# Patient Record
Sex: Female | Born: 1945 | Race: Black or African American | Hispanic: No | Smoking: Former smoker
Health system: Southern US, Community
[De-identification: ages and names within clinical notes are randomized; demographics above are authoritative.]

## PROBLEM LIST (undated history)

## (undated) DIAGNOSIS — C801 Malignant (primary) neoplasm, unspecified: Secondary | ICD-10-CM

## (undated) DIAGNOSIS — K5900 Constipation, unspecified: Secondary | ICD-10-CM

## (undated) DIAGNOSIS — Z972 Presence of dental prosthetic device (complete) (partial): Secondary | ICD-10-CM

## (undated) DIAGNOSIS — G4700 Insomnia, unspecified: Secondary | ICD-10-CM

## (undated) DIAGNOSIS — H9190 Unspecified hearing loss, unspecified ear: Secondary | ICD-10-CM

## (undated) DIAGNOSIS — I10 Essential (primary) hypertension: Secondary | ICD-10-CM

## (undated) DIAGNOSIS — E119 Type 2 diabetes mellitus without complications: Secondary | ICD-10-CM

## (undated) DIAGNOSIS — G629 Polyneuropathy, unspecified: Secondary | ICD-10-CM

## (undated) HISTORY — DX: Constipation, unspecified: K59.00

## (undated) HISTORY — DX: Insomnia, unspecified: G47.00

## (undated) HISTORY — PX: LIVER BIOPSY: SHX301

## (undated) HISTORY — PX: CARDIAC SURGERY: SHX584

---

## 2005-06-21 ENCOUNTER — Emergency Department: Payer: Self-pay | Admitting: Emergency Medicine

## 2005-06-25 ENCOUNTER — Ambulatory Visit: Payer: Self-pay | Admitting: Family Medicine

## 2007-03-24 ENCOUNTER — Ambulatory Visit: Payer: Self-pay | Admitting: Family Medicine

## 2007-04-08 ENCOUNTER — Emergency Department: Payer: Self-pay | Admitting: Internal Medicine

## 2007-04-08 ENCOUNTER — Other Ambulatory Visit: Payer: Self-pay

## 2009-06-01 ENCOUNTER — Emergency Department: Payer: Self-pay | Admitting: Emergency Medicine

## 2010-01-03 ENCOUNTER — Ambulatory Visit: Payer: Self-pay | Admitting: Family Medicine

## 2010-02-21 ENCOUNTER — Ambulatory Visit: Payer: Self-pay | Admitting: General Practice

## 2010-09-10 ENCOUNTER — Emergency Department: Payer: Self-pay | Admitting: Emergency Medicine

## 2010-09-21 ENCOUNTER — Inpatient Hospital Stay: Payer: Self-pay | Admitting: Internal Medicine

## 2010-10-03 ENCOUNTER — Ambulatory Visit: Payer: Self-pay | Admitting: Unknown Physician Specialty

## 2010-10-07 ENCOUNTER — Ambulatory Visit: Payer: Self-pay | Admitting: Unknown Physician Specialty

## 2011-09-23 ENCOUNTER — Emergency Department: Payer: Self-pay | Admitting: Emergency Medicine

## 2011-09-23 LAB — CK TOTAL AND CKMB (NOT AT ARMC)
CK, Total: 128 U/L (ref 21–215)
CK-MB: 1.7 ng/mL (ref 0.5–3.6)

## 2011-09-23 LAB — COMPREHENSIVE METABOLIC PANEL
Alkaline Phosphatase: 156 U/L — ABNORMAL HIGH (ref 50–136)
BUN: 16 mg/dL (ref 7–18)
Calcium, Total: 10 mg/dL (ref 8.5–10.1)
Creatinine: 0.63 mg/dL (ref 0.60–1.30)
EGFR (African American): >60
EGFR (Non-African Amer.): >60
Osmolality: 281 (ref 275–301)
SGOT(AST): 13 U/L — ABNORMAL LOW (ref 15–37)
Sodium: 141 mmol/L (ref 136–145)
Total Protein: 7.7 g/dL (ref 6.4–8.2)

## 2011-09-23 LAB — TSH: Thyroid Stimulating Horm: 0.727 u[IU]/mL

## 2011-09-23 LAB — CBC
HCT: 47.4 % — ABNORMAL HIGH (ref 35.0–47.0)
HGB: 15.5 g/dL (ref 12.0–16.0)
MCH: 29.2 pg (ref 26.0–34.0)
MCHC: 32.7 g/dL (ref 32.0–36.0)
Platelet: 269 10*3/uL (ref 150–440)
RBC: 5.29 10*6/uL — ABNORMAL HIGH (ref 3.80–5.20)
RDW: 14.3 % (ref 11.5–14.5)

## 2011-09-23 LAB — URINALYSIS, COMPLETE
Bacteria: NONE SEEN
Ketone: NEGATIVE
Nitrite: NEGATIVE
Protein: NEGATIVE
Specific Gravity: 1.003 (ref 1.003–1.030)
Squamous Epithelial: 1
WBC UR: 1 /HPF (ref 0–5)

## 2011-09-23 LAB — TROPONIN I: Troponin-I: <0.02 ng/mL

## 2012-08-10 ENCOUNTER — Ambulatory Visit: Payer: Self-pay | Admitting: Internal Medicine

## 2014-08-28 ENCOUNTER — Ambulatory Visit: Payer: Medicare Other

## 2014-08-28 ENCOUNTER — Ambulatory Visit: Payer: Medicare Other | Admitting: Anesthesiology

## 2014-08-28 ENCOUNTER — Encounter
Admission: RE | Disposition: A | Discharge status: Home / Self Care | Payer: Self-pay | Source: Ambulatory Visit | Attending: Emergency Medicine

## 2014-08-28 ENCOUNTER — Observation Stay
Admission: RE | Admit: 2014-08-28 | Discharge: 2014-08-29 | Disposition: A | Discharge status: Home / Self Care | Payer: Medicare Other | Source: Ambulatory Visit | Attending: Internal Medicine | Admitting: Internal Medicine

## 2014-08-28 ENCOUNTER — Observation Stay: Payer: Medicare Other

## 2014-08-28 ENCOUNTER — Encounter: Payer: Self-pay | Admitting: *Deleted

## 2014-08-28 DIAGNOSIS — Z820 Family history of epilepsy and other diseases of the nervous system: Secondary | ICD-10-CM | POA: Diagnosis not present

## 2014-08-28 DIAGNOSIS — I209 Angina pectoris, unspecified: Secondary | ICD-10-CM

## 2014-08-28 DIAGNOSIS — Z79899 Other long term (current) drug therapy: Secondary | ICD-10-CM | POA: Insufficient documentation

## 2014-08-28 DIAGNOSIS — E010 Iodine-deficiency related diffuse (endemic) goiter: Secondary | ICD-10-CM | POA: Insufficient documentation

## 2014-08-28 DIAGNOSIS — I10 Essential (primary) hypertension: Secondary | ICD-10-CM | POA: Insufficient documentation

## 2014-08-28 DIAGNOSIS — E785 Hyperlipidemia, unspecified: Secondary | ICD-10-CM | POA: Diagnosis not present

## 2014-08-28 DIAGNOSIS — R000 Tachycardia, unspecified: Secondary | ICD-10-CM | POA: Diagnosis not present

## 2014-08-28 DIAGNOSIS — Z6821 Body mass index (BMI) 21.0-21.9, adult: Secondary | ICD-10-CM | POA: Diagnosis not present

## 2014-08-28 DIAGNOSIS — R0789 Other chest pain: Secondary | ICD-10-CM | POA: Diagnosis not present

## 2014-08-28 DIAGNOSIS — R0602 Shortness of breath: Secondary | ICD-10-CM | POA: Diagnosis not present

## 2014-08-28 DIAGNOSIS — H9190 Unspecified hearing loss, unspecified ear: Secondary | ICD-10-CM | POA: Diagnosis not present

## 2014-08-28 DIAGNOSIS — Z7982 Long term (current) use of aspirin: Secondary | ICD-10-CM | POA: Insufficient documentation

## 2014-08-28 DIAGNOSIS — E119 Type 2 diabetes mellitus without complications: Secondary | ICD-10-CM | POA: Insufficient documentation

## 2014-08-28 DIAGNOSIS — I2510 Atherosclerotic heart disease of native coronary artery without angina pectoris: Secondary | ICD-10-CM | POA: Insufficient documentation

## 2014-08-28 DIAGNOSIS — R42 Dizziness and giddiness: Secondary | ICD-10-CM | POA: Insufficient documentation

## 2014-08-28 DIAGNOSIS — E876 Hypokalemia: Secondary | ICD-10-CM

## 2014-08-28 DIAGNOSIS — I959 Hypotension, unspecified: Secondary | ICD-10-CM | POA: Insufficient documentation

## 2014-08-28 DIAGNOSIS — Z833 Family history of diabetes mellitus: Secondary | ICD-10-CM | POA: Insufficient documentation

## 2014-08-28 DIAGNOSIS — R160 Hepatomegaly, not elsewhere classified: Secondary | ICD-10-CM | POA: Diagnosis not present

## 2014-08-28 DIAGNOSIS — F17210 Nicotine dependence, cigarettes, uncomplicated: Secondary | ICD-10-CM | POA: Diagnosis not present

## 2014-08-28 DIAGNOSIS — R634 Abnormal weight loss: Secondary | ICD-10-CM | POA: Diagnosis not present

## 2014-08-28 DIAGNOSIS — R079 Chest pain, unspecified: Secondary | ICD-10-CM

## 2014-08-28 DIAGNOSIS — M1990 Unspecified osteoarthritis, unspecified site: Secondary | ICD-10-CM | POA: Insufficient documentation

## 2014-08-28 DIAGNOSIS — R0781 Pleurodynia: Secondary | ICD-10-CM

## 2014-08-28 HISTORY — DX: Type 2 diabetes mellitus without complications: E11.9

## 2014-08-28 HISTORY — DX: Essential (primary) hypertension: I10

## 2014-08-28 LAB — BASIC METABOLIC PANEL
Anion gap: 10 (ref 5–15)
BUN: 13 mg/dL (ref 6–20)
CO2: 29 mmol/L (ref 22–32)
Calcium: 8.9 mg/dL (ref 8.9–10.3)
Chloride: 103 mmol/L (ref 101–111)
Creatinine, Ser: 0.89 mg/dL (ref 0.44–1.00)
GFR calc Af Amer: >60 mL/min (ref >60)
GFR calc non Af Amer: >60 mL/min (ref >60)
GLUCOSE: 97 mg/dL (ref 65–99)
POTASSIUM: 2.9 mmol/L — AB (ref 3.5–5.1)
Sodium: 142 mmol/L (ref 135–145)

## 2014-08-28 LAB — PROTIME-INR
INR: 1.12
Prothrombin Time: 14.6 seconds (ref 11.4–15.0)

## 2014-08-28 LAB — CBC
HCT: 30.7 % — ABNORMAL LOW (ref 35.0–47.0)
HEMOGLOBIN: 9.7 g/dL — AB (ref 12.0–16.0)
MCH: 26.4 pg (ref 26.0–34.0)
MCHC: 31.8 g/dL — ABNORMAL LOW (ref 32.0–36.0)
MCV: 83.2 fL (ref 80.0–100.0)
Platelets: 548 10*3/uL — ABNORMAL HIGH (ref 150–440)
RBC: 3.69 MIL/uL — ABNORMAL LOW (ref 3.80–5.20)
RDW: 16.3 % — AB (ref 11.5–14.5)
WBC: 15.1 10*3/uL — ABNORMAL HIGH (ref 3.6–11.0)

## 2014-08-28 LAB — URINALYSIS COMPLETE WITH MICROSCOPIC (ARMC ONLY)
BILIRUBIN URINE: NEGATIVE
Bacteria, UA: NONE SEEN
GLUCOSE, UA: NEGATIVE mg/dL
Hgb urine dipstick: NEGATIVE
Ketones, ur: NEGATIVE mg/dL
Leukocytes, UA: NEGATIVE
NITRITE: NEGATIVE
Protein, ur: NEGATIVE mg/dL
Specific Gravity, Urine: 1.016 (ref 1.005–1.030)
pH: 5 (ref 5.0–8.0)

## 2014-08-28 LAB — HEPATIC FUNCTION PANEL
ALBUMIN: 2.4 g/dL — AB (ref 3.5–5.0)
ALK PHOS: 178 U/L — AB (ref 38–126)
ALT: 11 U/L — AB (ref 14–54)
AST: 24 U/L (ref 15–41)
Bilirubin, Direct: <0.1 mg/dL — ABNORMAL LOW (ref 0.1–0.5)
Total Bilirubin: 0.6 mg/dL (ref 0.3–1.2)
Total Protein: 6 g/dL — ABNORMAL LOW (ref 6.5–8.1)

## 2014-08-28 LAB — GLUCOSE, CAPILLARY
GLUCOSE-CAPILLARY: 104 mg/dL — AB (ref 65–99)
GLUCOSE-CAPILLARY: 73 mg/dL (ref 65–99)
Glucose-Capillary: 101 mg/dL — ABNORMAL HIGH (ref 65–99)

## 2014-08-28 LAB — FIBRIN DERIVATIVES D-DIMER (ARMC ONLY): FIBRIN DERIVATIVES D-DIMER (ARMC): 1524 — AB (ref 0–499)

## 2014-08-28 LAB — APTT: APTT: 39 s — AB (ref 24–36)

## 2014-08-28 LAB — POTASSIUM: Potassium: 3.3 mmol/L — ABNORMAL LOW (ref 3.5–5.1)

## 2014-08-28 LAB — TROPONIN I: Troponin I: <0.03 ng/mL (ref <0.031)

## 2014-08-28 LAB — MAGNESIUM: MAGNESIUM: 2 mg/dL (ref 1.7–2.4)

## 2014-08-28 SURGERY — COLONOSCOPY
Anesthesia: General

## 2014-08-28 MED ORDER — ASPIRIN EC 325 MG PO TBEC
325.0000 mg | DELAYED_RELEASE_TABLET | Freq: Once | ORAL | Status: AC
  Administered 2014-08-28: 325 mg via ORAL
  Filled 2014-08-28 (×2): qty 1

## 2014-08-28 MED ORDER — IOHEXOL 350 MG/ML SOLN
100.0000 mL | Freq: Once | INTRAVENOUS | Status: AC | PRN
  Administered 2014-08-28: 100 mL via INTRAVENOUS

## 2014-08-28 MED ORDER — HYDROCHLOROTHIAZIDE 25 MG PO TABS: 25.0000 mg | ORAL_TABLET | Freq: Every day | ORAL | Status: DC

## 2014-08-28 MED ORDER — TRAZODONE HCL 50 MG PO TABS
25.0000 mg | ORAL_TABLET | Freq: Every evening | ORAL | Status: DC | PRN
  Administered 2014-08-28: 25 mg via ORAL
  Filled 2014-08-28: qty 1

## 2014-08-28 MED ORDER — GLIPIZIDE 10 MG PO TABS: 10.0000 mg | ORAL_TABLET | Freq: Every day | ORAL | Status: DC

## 2014-08-28 MED ORDER — METFORMIN HCL 500 MG PO TABS
1000.0000 mg | ORAL_TABLET | Freq: Two times a day (BID) | ORAL | Status: DC
  Administered 2014-08-28: 1000 mg via ORAL
  Filled 2014-08-28: qty 2

## 2014-08-28 MED ORDER — PIOGLITAZONE HCL 30 MG PO TABS: 30.0000 mg | ORAL_TABLET | Freq: Every day | ORAL | Status: DC

## 2014-08-28 MED ORDER — ATORVASTATIN CALCIUM 20 MG PO TABS
40.0000 mg | ORAL_TABLET | Freq: Every day | ORAL | Status: DC
  Administered 2014-08-29: 40 mg via ORAL
  Filled 2014-08-28: qty 2

## 2014-08-28 MED ORDER — POTASSIUM CHLORIDE 20 MEQ PO PACK
40.0000 meq | PACK | ORAL | Status: AC
  Administered 2014-08-28: 40 meq via ORAL

## 2014-08-28 MED ORDER — NITROGLYCERIN 0.4 MG SL SUBL
0.4000 mg | SUBLINGUAL_TABLET | SUBLINGUAL | Status: DC | PRN
  Administered 2014-08-28: 0.4 mg via SUBLINGUAL
  Filled 2014-08-28 (×3): qty 25

## 2014-08-28 MED ORDER — POTASSIUM CHLORIDE 20 MEQ/100ML IV SOLN: 20.0000 meq | Freq: Once | INTRAVENOUS | Status: DC

## 2014-08-28 MED ORDER — LISINOPRIL 20 MG PO TABS: 20.0000 mg | ORAL_TABLET | Freq: Every day | ORAL | Status: DC

## 2014-08-28 MED ORDER — INSULIN ASPART 100 UNIT/ML ~~LOC~~ SOLN
3.0000 [IU] | Freq: Three times a day (TID) | SUBCUTANEOUS | Status: DC
  Administered 2014-08-29: 3 [IU] via SUBCUTANEOUS
  Filled 2014-08-28: qty 3

## 2014-08-28 MED ORDER — METOPROLOL SUCCINATE ER 100 MG PO TB24: 200.0000 mg | ORAL_TABLET | Freq: Every day | ORAL | Status: DC

## 2014-08-28 MED ORDER — HEPARIN BOLUS VIA INFUSION
3300.0000 [IU] | Freq: Once | INTRAVENOUS | Status: AC
  Administered 2014-08-28: 3300 [IU] via INTRAVENOUS
  Filled 2014-08-28: qty 3300

## 2014-08-28 MED ORDER — SODIUM CHLORIDE 0.9 % IV SOLN
INTRAVENOUS | Status: DC
  Administered 2014-08-28: 09:00:00 via INTRAVENOUS

## 2014-08-28 MED ORDER — POTASSIUM CHLORIDE 20 MEQ/15ML (10%) PO SOLN
20.0000 meq | ORAL | Status: DC
  Filled 2014-08-28: qty 15

## 2014-08-28 MED ORDER — POTASSIUM CHLORIDE 20 MEQ PO PACK
PACK | ORAL | Status: AC
  Administered 2014-08-28: 40 meq via ORAL
  Filled 2014-08-28: qty 2

## 2014-08-28 MED ORDER — NICOTINE 10 MG IN INHA
1.0000 | RESPIRATORY_TRACT | Status: AC | PRN
  Filled 2014-08-28: qty 168

## 2014-08-28 MED ORDER — INSULIN ASPART 100 UNIT/ML ~~LOC~~ SOLN: 0.0000 [IU] | Freq: Every day | SUBCUTANEOUS | Status: DC

## 2014-08-28 MED ORDER — SODIUM CHLORIDE 0.9 % IV SOLN: INTRAVENOUS | Status: DC

## 2014-08-28 MED ORDER — HEPARIN (PORCINE) IN NACL 100-0.45 UNIT/ML-% IJ SOLN
650.0000 [IU]/h | INTRAMUSCULAR | Status: DC
  Administered 2014-08-28: 650 [IU]/h via INTRAVENOUS
  Filled 2014-08-28: qty 250

## 2014-08-28 MED ORDER — POTASSIUM CHLORIDE CRYS ER 20 MEQ PO TBCR: 40.0000 meq | EXTENDED_RELEASE_TABLET | ORAL | Status: DC | PRN

## 2014-08-28 MED ORDER — NITROGLYCERIN 2 % TD OINT
0.5000 [in_us] | TOPICAL_OINTMENT | Freq: Four times a day (QID) | TRANSDERMAL | Status: DC
  Administered 2014-08-28 – 2014-08-29 (×2): 0.5 [in_us] via TOPICAL
  Filled 2014-08-28 (×2): qty 1

## 2014-08-28 MED ORDER — LISINOPRIL-HYDROCHLOROTHIAZIDE 20-25 MG PO TABS: 1.0000 | ORAL_TABLET | Freq: Every day | ORAL | Status: DC

## 2014-08-28 MED ORDER — NICOTINE 21 MG/24HR TD PT24
21.0000 mg | MEDICATED_PATCH | Freq: Every day | TRANSDERMAL | Status: DC
  Administered 2014-08-28 – 2014-08-29 (×2): 21 mg via TRANSDERMAL
  Filled 2014-08-28 (×2): qty 1

## 2014-08-28 MED ORDER — POTASSIUM CHLORIDE 10 MEQ/100ML IV SOLN
10.0000 meq | INTRAVENOUS | Status: DC
  Administered 2014-08-28: 10 meq via INTRAVENOUS
  Filled 2014-08-28 (×2): qty 100

## 2014-08-28 MED ORDER — INSULIN ASPART 100 UNIT/ML ~~LOC~~ SOLN
0.0000 [IU] | Freq: Three times a day (TID) | SUBCUTANEOUS | Status: DC
  Administered 2014-08-29: 3 [IU] via SUBCUTANEOUS
  Filled 2014-08-28: qty 3

## 2014-08-28 MED ORDER — ASPIRIN EC 81 MG PO TBEC
81.0000 mg | DELAYED_RELEASE_TABLET | Freq: Every day | ORAL | Status: DC
  Administered 2014-08-29: 81 mg via ORAL
  Filled 2014-08-28: qty 1

## 2014-08-28 NOTE — ED Notes (Signed)
Pt was in endo to have annual colonoscopy and developed left sided chest pain.

## 2014-08-28 NOTE — Progress Notes (Signed)
ANTICOAGULATION CONSULT NOTE - Initial Consult  Pharmacy Consult for Heparin drip Indication: chest pain/ACS  No Known Allergies  Patient Measurements: Height: 5\' 5"  (165.1 cm) Weight: 123 lb 11.2 oz (56.11 kg) (admisson) IBW/kg (Calculated) : 57 Heparin Dosing Weight: 56.1 kg  Vital Signs: Temp: 97.9 F (36.6 C) (05/17 1640) Temp Source: Oral (05/17 1640) BP: 124/65 mmHg (05/17 1640) Pulse Rate: 64 (05/17 1640)  Labs:  Recent Labs  08/28/14 1150 08/28/14 1645  HGB 9.7*  --   HCT 30.7*  --   PLT 548*  --   APTT  --  39*  LABPROT  --  14.6  INR  --  1.12  CREATININE 0.89  --   TROPONINI <0.03  --     Estimated Creatinine Clearance: 52.1 mL/min (by C-G formula based on Cr of 0.89).   Medical History: Past Medical History  Diagnosis Date  . Hypertension   . Diabetes mellitus without complication   . Arthritis      Goal of Therapy:  Heparin level 0.3-0.7 units/ml Monitor platelets by anticoagulation protocol: Yes   Plan:  Give 3300 units bolus x 1 Start heparin infusion at 650 units/hr Check anti-Xa level in 8 hours and daily while on heparin Continue to monitor H&H and platelets - CBC in AM   Rayna Sexton, PharmD, BCPS Clinical Pharmacist 08/28/2014,5:54 PM

## 2014-08-28 NOTE — Anesthesia Preprocedure Evaluation (Signed)
Anesthesia Evaluation  Patient identified by MRN, date of birth, ID band Patient awake    Reviewed: Allergy & Precautions, NPO status , Patient's Chart, lab work & pertinent test results  Airway Mallampati: II  TM Distance: >3 FB Neck ROM: Full    Dental  (+) Partial Lower, Upper Dentures   Pulmonary Current Smoker (<1/2 ppd),          Cardiovascular hypertension, Pt. on medications     Neuro/Psych    GI/Hepatic   Endo/Other  diabetes, Type 2, Oral Hypoglycemic Agents  Renal/GU      Musculoskeletal   Abdominal   Peds  Hematology   Anesthesia Other Findings   Reproductive/Obstetrics                             Anesthesia Physical Anesthesia Plan  ASA: III  Anesthesia Plan: General   Post-op Pain Management:    Induction: Intravenous  Airway Management Planned: Nasal Cannula  Additional Equipment:   Intra-op Plan:   Post-operative Plan:   Informed Consent: I have reviewed the patients History and Physical, chart, labs and discussed the procedure including the risks, benefits and alternatives for the proposed anesthesia with the patient or authorized representative who has indicated his/her understanding and acceptance.     Plan Discussed with:   Anesthesia Plan Comments:         Anesthesia Quick Evaluation

## 2014-08-28 NOTE — ED Notes (Signed)
NPO after midnight and d/c all nicotine after midnight for am stress test. Floor nurse made aware.

## 2014-08-28 NOTE — H&P (Addendum)
[facility] [place] NAME: Sandy [name]    MR#:  [id number]  DATE OF BIRTH:  09-07-1945  DATE OF ADMISSION:  08/28/2014  PRIMARY CARE PHYSICIAN: [name], MD   REQUESTING/REFERRING PHYSICIAN:  Dr. Karma Greaser  CHIEF COMPLAINT:   Chief Complaint  Patient presents with  . Chest Pain    HISTORY OF PRESENT ILLNESS: Sandy [name]  is a 69 y.o. female with a known history of hypertension, hyperlipidemia, diabetes mellitus and tobacco abuse who presents to the hospital with chest pains. Apparently patient was being prepared for colonoscopy by Dr Allen Norris and the sided complaining of intermittent chest pains. She was sent to emergency room for further evaluation not proceeding to the procedure.  In the emergency room patient admitted of having chest pains in the left side of her chest under her breast for the past 6 weeks. She does describe being her pain as intermittent, which comes and goes, sometimes very intense, patient tells me that her pain comes whenever she sits or lays down and there was no noted change in pain with walking, deep breathing or eating. In width, with shortness of breath as well as lightheadedness and dizziness. Patient admits of radiation to bilateral jaws as well as bilateral upper extremities. Yesterday the patient had pain which lasted approximately 15 minutes and was 8 out of 10 by intensity. She took some ibuprofen which did help her with pains but today the pain recurred and she was sent to emergency room. In emergency room, her labs revealed normal troponin, however, her EKG showed T depressions in lateral leads, however, not new. Hospitalist services were contacted for admission.   PAST MEDICAL HISTORY:   Past Medical History  Diagnosis Date  . Hypertension   . Diabetes mellitus without complication   . Arthritis     PAST SURGICAL HISTORY: History reviewed. No pertinent past surgical history.  SOCIAL  HISTORY:  History  Substance Use Topics  . Smoking status: Current Every Day Smoker -- 0.25 packs/day    Types: Cigarettes  . Smokeless tobacco: Never Used  . Alcohol Use: No    FAMILY HISTORY: History reviewed. No pertinent family history.  DRUG ALLERGIES: No Known Allergies  Review of Systems  Constitutional: Positive for weight loss and malaise/fatigue. Negative for fever and chills.  Respiratory: Positive for cough, sputum production and shortness of breath.   Cardiovascular: Positive for chest pain and palpitations. Negative for orthopnea, claudication, leg swelling and PND.  Gastrointestinal: Positive for abdominal pain. Negative for nausea, vomiting, diarrhea, constipation and blood in stool.  Genitourinary: Negative for dysuria and urgency.  Musculoskeletal: Positive for neck pain.  Neurological: Positive for dizziness and weakness. Negative for tingling, tremors and sensory change.  Psychiatric/Behavioral: Negative for depression and suicidal ideas.    MEDICATIONS AT HOME:  Prior to Admission medications   Medication Sig Start Date End Date Taking? Authorizing Provider  amLODipine (NORVASC) 10 MG tablet Take 10 mg by mouth daily.   Yes Historical Provider, MD  aspirin EC 81 MG tablet Take 81 mg by mouth daily.   Yes Historical Provider, MD  atorvastatin (LIPITOR) 40 MG tablet Take 40 mg by mouth daily.   Yes Historical Provider, MD  glipiZIDE (GLUCOTROL) 10 MG tablet Take 10 mg by mouth daily.   Yes Historical Provider, MD  ibuprofen (ADVIL,MOTRIN) 200 MG tablet Take 400 mg by mouth every 6 (six) hours as needed for headache or moderate pain.   Yes Historical  Provider, MD  lisinopril-hydrochlorothiazide (PRINZIDE,ZESTORETIC) 20-25 MG per tablet Take 1 tablet by mouth daily.   Yes Historical Provider, MD  metFORMIN (GLUCOPHAGE) 1000 MG tablet Take 1,000 mg by mouth 2 (two) times daily.   Yes Historical Provider, MD  metoprolol (TOPROL-XL) 200 MG 24 hr tablet Take 200 mg by  mouth daily.   Yes Historical Provider, MD  Multiple Vitamin (MULTIVITAMIN WITH MINERALS) TABS tablet Take 1 tablet by mouth daily.   Yes Historical Provider, MD  pioglitazone (ACTOS) 30 MG tablet Take 30 mg by mouth daily.   Yes Historical Provider, MD      PHYSICAL EXAMINATION:   VITAL SIGNS: Blood pressure 115/68, pulse 61, temperature 98.5 F (36.9 C), temperature source Oral, resp. rate 16, height 5\' 5"  (1.651 m), weight 55.339 kg (122 lb), SpO2 99 %.  GENERAL:  69 y.o.-year-old patient lying in the bed with no acute distress.  EYES: Pupils equal, round, reactive to light and accommodation. No scleral icterus. Extraocular muscles intact.  HEENT: Head atraumatic, normocephalic. Oropharynx and nasopharynx clear.  NECK:  Supple, no jugular venous distention. No thyroid enlargement, no tenderness.  LUNGS: Normal breath sounds bilaterally, no wheezing, rales,rhonchi or crepitation. No use of accessory muscles of respiration.  CARDIOVASCULAR: S1, S2 normal. No murmurs, rubs, or gallops.  ABDOMEN: Soft, nontender, nondistended. Bowel sounds present. No organomegaly or mass.  EXTREMITIES: No pedal edema, cyanosis, or clubbing.  NEUROLOGIC: Cranial nerves II through XII are intact. Muscle strength 5/5 in all extremities. Sensation intact. Gait not checked.  PSYCHIATRIC: The patient is alert and oriented x 3.  SKIN: No obvious rash, lesion, or ulcer.   LABORATORY PANEL:   CBC  Recent Labs Lab 08/28/14 1150  WBC 15.1*  HGB 9.7*  HCT 30.7*  PLT 548*  MCV 83.2  MCH 26.4  MCHC 31.8*  RDW 16.3*   ------------------------------------------------------------------------------------------------------------------  Chemistries   Recent Labs Lab 08/28/14 1150  NA 142  K 2.9*  CL 103  CO2 29  GLUCOSE 97  BUN 13  CREATININE 0.89  CALCIUM 8.9  MG 2.0  AST 24  ALT 11*  ALKPHOS 178*  BILITOT 0.6    ------------------------------------------------------------------------------------------------------------------  Cardiac Enzymes  Recent Labs Lab 08/28/14 1150  TROPONINI <0.03   ------------------------------------------------------------------------------------------------------------------  RADIOLOGY: Dg Chest 2 View  08/28/2014   CLINICAL DATA:  69 year old female presented for colonoscopy, was canceled. Hypotension, left chest pain. Initial encounter.  EXAM: CHEST  2 VIEW  COMPARISON:  09/23/2011 and earlier.  FINDINGS: Stable cardiac size at the upper limits of normal. Other mediastinal contours are within normal limits. Visualized tracheal air column is within normal limits. Stable mild elevation of the right hemidiaphragm. No pneumothorax, pulmonary edema, pleural effusion or confluent pulmonary opacity. Degenerative endplate spurring in the lower thoracic spine. No acute osseous abnormality identified.  IMPRESSION: No acute cardiopulmonary abnormality.   Electronically Signed   By: Genevie Ann M.D.   On: 08/28/2014 13:39    EKG: Orders placed or performed during the hospital encounter of 08/28/14  . EKG 12-Lead  . EKG 12-Lead  . EKG 12-Lead  . EKG 12-Lead  . ED EKG (<2mins upon arrival to the ED)  . ED EKG (<30mins upon arrival to the ED)    IMPRESSION AND PLAN: *Chest pain concerning for ischemic admit patient to medical floor, continue her on Toprol-XL, aspirin, nitroglycerin and check cardiac enzymes 3, we will also initiate patient on heparin drip. We will get cardiologist to see patient in consultation. He had and will  get stress test tomorrow if cardiac enzymes are negative. *Hypokalemia, supplement orally as well as IV , check potassium level later today. Magnesium level was checked and it was normal. *Leukocytosis of unclear etiology, get UA and culture if needed *Sinus tachycardia with heart rate 118 reported in the emergency room, we will get d-dimer checked, and  if it is elevated, we will get CT scan of the chest to rule out PE *. Tobacco abuse, discussed this patient for 4 minutes. Nicotine replacement therapy will be initiated. Patient is agreeable to quit.   All the records are reviewed and case discussed with ED provider. Management plans discussed with the patient, family and they are in agreement.  CODE STATUS:    TOTAL TIME TAKING CARE OF THIS PATIENT: 50 minutes.    Theodoro Grist M.D on 08/28/2014 at 3:04 PM  Between 7am to 6pm - Pager - [phone] After 6pm go to [email or web address] - password [email or web address]  Office  [phone]  CC: Primary care physician; [name], MD

## 2014-08-28 NOTE — ED Provider Notes (Signed)
[facility] Emergency Department Provider Note  ____________________________________________  Time seen: Approximately 12:00 PM  I have reviewed the triage vital signs and the nursing notes.   HISTORY  Chief Complaint Chest Pain    HPI Sandy [name] is a 69 y.o. female with a history of hypertension, hyperlipidemia, diabetes, and tobacco use who presents with left-sided chest pain that started while she was lying down preparing for a colonoscopy.  The procedure had not yet started and she had not received any medications.  She was sent over from the endoscopy suite to the emergency department.  The patient states that for several weeks she has had intermittent episodes of chest pain that is left-sided and occasionally radiates to her left jaw.  She describes it as mostly a pressure but also sharp and stabbing at times.  It is accompanied with shortness of breath.  She denies nausea and vomiting.  Her pain today was severe pressure at its worse and now is mild but still present.She received a full dose aspirin after the start of her symptoms.   Past Medical History  Diagnosis Date  . Hypertension   . Diabetes mellitus without complication   . Arthritis     There are no active problems to display for this patient.   History reviewed. No pertinent past surgical history.  Current Outpatient Rx  Name  Route  Sig  Dispense  Refill  . amLODipine (NORVASC) 10 MG tablet   Oral   Take 10 mg by mouth daily.         Marland Kitchen aspirin EC 81 MG tablet   Oral   Take 81 mg by mouth daily.         Marland Kitchen atorvastatin (LIPITOR) 40 MG tablet   Oral   Take 40 mg by mouth daily.         Marland Kitchen glipiZIDE (GLUCOTROL) 10 MG tablet   Oral   Take 10 mg by mouth daily.         Marland Kitchen ibuprofen (ADVIL,MOTRIN) 200 MG tablet   Oral   Take 400 mg by mouth every 6 (six) hours as needed for headache or moderate pain.         Marland Kitchen lisinopril-hydrochlorothiazide  (PRINZIDE,ZESTORETIC) 20-25 MG per tablet   Oral   Take 1 tablet by mouth daily.         . metFORMIN (GLUCOPHAGE) 1000 MG tablet   Oral   Take 1,000 mg by mouth 2 (two) times daily.         . metoprolol (TOPROL-XL) 200 MG 24 hr tablet   Oral   Take 200 mg by mouth daily.         . Multiple Vitamin (MULTIVITAMIN WITH MINERALS) TABS tablet   Oral   Take 1 tablet by mouth daily.         . pioglitazone (ACTOS) 30 MG tablet   Oral   Take 30 mg by mouth daily.           Allergies Review of patient's allergies indicates no known allergies.  History reviewed. No pertinent family history.  Social History History  Substance Use Topics  . Smoking status: Current Every Day Smoker -- 0.25 packs/day    Types: Cigarettes  . Smokeless tobacco: Never Used  . Alcohol Use: No    Review of Systems Constitutional: No fever/chills Eyes: No visual changes. ENT: No sore throat. Cardiovascular: Severe chest pain (heaviness and sharp) at rest as described above Respiratory: Shortness of breath  with the chest pain Gastrointestinal: No abdominal pain.  No nausea, no vomiting.  No diarrhea.  No constipation. Genitourinary: Occasional dysuria Musculoskeletal: Negative for back pain. Skin: Negative for rash. Neurological: Negative for headaches, focal weakness or numbness.  10-point ROS otherwise negative.  ____________________________________________   PHYSICAL EXAM:  VITAL SIGNS: ED Triage Vitals  Enc Vitals Group     BP 08/28/14 0853 107/66 mmHg     Pulse Rate 08/28/14 0853 118     Resp 08/28/14 0853 18     Temp 08/28/14 0853 96.8 F (36 C)     Temp Source 08/28/14 0853 Tympanic     SpO2 08/28/14 1136 98 %     Weight 08/28/14 0853 130 lb (58.968 kg)     Height 08/28/14 0853 5\' 5"  (1.651 m)     Head Cir --      Peak Flow --      Pain Score --      Pain Loc --      Pain Edu? --      Excl. in [place]? --     Constitutional: Alert and oriented. Well appearing and in no  acute distress. Eyes: Conjunctivae are normal. PERRL. EOMI. Head: Atraumatic. Nose: No congestion/rhinnorhea. Mouth/Throat: Mucous membranes are moist.  Oropharynx non-erythematous. Neck: No stridor.   Cardiovascular: Normal rate, regular rhythm. Grossly normal heart sounds.  Good peripheral circulation.  No reproducible chest wall tenderness. Respiratory: Normal respiratory effort.  No retractions. Lungs CTAB. Gastrointestinal: Soft and nontender. No distention. No abdominal bruits. No CVA tenderness. Musculoskeletal: No lower extremity tenderness nor edema.  No joint effusions. Neurologic:  Normal speech and language. No gross focal neurologic deficits are appreciated. Speech is normal. No gait instability. Skin:  Skin is warm, dry and intact. No rash noted. Psychiatric: Mood and affect are normal. Speech and behavior are normal.  ____________________________________________   LABS (all labs ordered are listed, but only abnormal results are displayed)  Labs Reviewed  GLUCOSE, CAPILLARY - Abnormal; Notable for the following:    Glucose-Capillary 104 (*)    All other components within normal limits  CBC - Abnormal; Notable for the following:    WBC 15.1 (*)    RBC 3.69 (*)    Hemoglobin 9.7 (*)    HCT 30.7 (*)    MCHC 31.8 (*)    RDW 16.3 (*)    Platelets 548 (*)    All other components within normal limits  BASIC METABOLIC PANEL - Abnormal; Notable for the following:    Potassium 2.9 (*)    All other components within normal limits  URINALYSIS COMPLETEWITH MICROSCOPIC ([facility])  - Abnormal; Notable for the following:    Color, Urine YELLOW (*)    APPearance CLEAR (*)    Squamous Epithelial / LPF 0-5 (*)    All other components within normal limits  TROPONIN I  HEPATIC FUNCTION PANEL  MAGNESIUM   UA - normal ____________________________________________  EKG  ED ECG REPORT   Date: 08/28/2014  EKG Time: 11:33  Rate: 63  Rhythm: normal sinus rhythm  Axis: Normal   Intervals:none  ST&T Change: New T-wave inversions in leads V4 V5 and V6 compared to the last available EKG in 2013.  Concerning for lateral ischemia.  ____________________________________________  RADIOLOGY  Dg Chest 2 View  08/28/2014   CLINICAL DATA:  69 year old female presented for colonoscopy, was canceled. Hypotension, left chest pain. Initial encounter.  EXAM: CHEST  2 VIEW  COMPARISON:  09/23/2011 and earlier.  FINDINGS: Stable cardiac  size at the upper limits of normal. Other mediastinal contours are within normal limits. Visualized tracheal air column is within normal limits. Stable mild elevation of the right hemidiaphragm. No pneumothorax, pulmonary edema, pleural effusion or confluent pulmonary opacity. Degenerative endplate spurring in the lower thoracic spine. No acute osseous abnormality identified.  IMPRESSION: No acute cardiopulmonary abnormality.   Electronically Signed   By: Genevie Ann M.D.   On: 08/28/2014 13:39    ____________________________________________   PROCEDURES  Procedure(s) performed: None  Critical Care performed: None  ____________________________________________   INITIAL IMPRESSION / ASSESSMENT AND PLAN / ED COURSE  Pertinent labs & imaging results that were available during my care of the patient were reviewed by me and considered in my medical decision making (see chart for details).  The patient has numerous risk factors for ischemic chest pain and a HEART score of 7 which is high risk and suggests an early invasive strategy.  The patient has artery received aspirin and I will await labs but I anticipate admission for cardiac evaluation.  ----------------------------------------- 1:23 PM on 08/28/2014 -----------------------------------------  The patient's troponin is negative.  Her labs are notable for a low potassium of 2.9.  I am repeating her potassium by mouth and by IV and I will speak with the hospitalist about admission for her high  risk chest pain. ____________________________________________   FINAL CLINICAL IMPRESSION(S) / ED DIAGNOSES  Final diagnoses:  Ischemic chest pain  Hypokalemia     Hinda Kehr, MD 08/28/14 1357

## 2014-08-28 NOTE — ED Notes (Signed)
Dr Karma Greaser notified that pts K 2.9.

## 2014-08-28 NOTE — H&P (Signed)
  [facility]  [address], [place], [place] [address] Phone: [phone] Fax : [phone]  Primary Care Physician:  [name], MD Primary Gastroenterologist:  Dr. Allen Norris  Pre-Procedure History & Physical: HPI:  Sandy [name] is a 69 y.o. female is here for an endoscopy and colonoscopy.   Past Medical History  Diagnosis Date  . Hypertension   . Diabetes mellitus without complication   . Arthritis     History reviewed. No pertinent past surgical history.  Prior to Admission medications   Medication Sig Start Date End Date Taking? Authorizing Provider  amLODipine (NORVASC) 10 MG tablet Take 10 mg by mouth daily.   Yes Historical Provider, MD  aspirin EC 81 MG tablet Take 81 mg by mouth daily.   Yes Historical Provider, MD  atorvastatin (LIPITOR) 40 MG tablet Take 40 mg by mouth daily.   Yes Historical Provider, MD  glipiZIDE (GLUCOTROL) 10 MG tablet Take 10 mg by mouth daily.   Yes Historical Provider, MD  ibuprofen (ADVIL,MOTRIN) 200 MG tablet Take 400 mg by mouth every 6 (six) hours as needed for headache or moderate pain.   Yes Historical Provider, MD  lisinopril-hydrochlorothiazide (PRINZIDE,ZESTORETIC) 20-25 MG per tablet Take 1 tablet by mouth daily.   Yes Historical Provider, MD  metFORMIN (GLUCOPHAGE) 1000 MG tablet Take 1,000 mg by mouth 2 (two) times daily.   Yes Historical Provider, MD  metoprolol (TOPROL-XL) 200 MG 24 hr tablet Take 200 mg by mouth daily.   Yes Historical Provider, MD  Multiple Vitamin (MULTIVITAMIN WITH MINERALS) TABS tablet Take 1 tablet by mouth daily.   Yes Historical Provider, MD  pioglitazone (ACTOS) 30 MG tablet Take 30 mg by mouth daily.   Yes Historical Provider, MD    Allergies as of 08/06/2014  . (Not on File)    History reviewed. No pertinent family history.  History   Social History  . Marital Status: Single    Spouse Name: N/A  . Number of Children: N/A  . Years of Education: N/A    Occupational History  . Not on file.   Social History Main Topics  . Smoking status: Light Tobacco Smoker -- 0.25 packs/day    Types: Cigarettes  . Smokeless tobacco: Not on file  . Alcohol Use: No  . Drug Use: No  . Sexual Activity: Not on file   Other Topics Concern  . Not on file   Social History Narrative  . No narrative on file    Review of Systems: See HPI, otherwise negative ROS  Physical Exam: BP 107/66 mmHg  Pulse 118  Temp(Src) 96.8 F (36 C) (Tympanic)  Resp 18  Ht 5\' 5"  (1.651 m)  Wt 130 lb (58.968 kg)  BMI 21.63 kg/m2 General:   Alert,  pleasant and cooperative in NAD Head:  Normocephalic and atraumatic. Neck:  Supple; no masses or thyromegaly. Lungs:  Clear throughout to auscultation.    Heart:  Regular rate and rhythm. Abdomen:  Soft, nontender and nondistended. Normal bowel sounds, without guarding, and without rebound.   Neurologic:  Alert and  oriented x4;  grossly normal neurologically.  Impression/Plan: Sandy [name] is here for an endoscopy and colonoscopy to be performed for weight loss and nausea  Risks, benefits, limitations, and alternatives regarding  endoscopy and colonoscopy have been reviewed with the patient.  Questions have been answered.  All parties agreeable.   [facility], MD  08/28/2014, 9:31 AM

## 2014-08-29 ENCOUNTER — Encounter: Payer: Self-pay | Admitting: Radiology

## 2014-08-29 ENCOUNTER — Observation Stay: Payer: Medicare Other

## 2014-08-29 LAB — COMPREHENSIVE METABOLIC PANEL
ALBUMIN: 2.1 g/dL — AB (ref 3.5–5.0)
ALK PHOS: 163 U/L — AB (ref 38–126)
ALT: 9 U/L — AB (ref 14–54)
ANION GAP: 10 (ref 5–15)
AST: 19 U/L (ref 15–41)
BUN: 12 mg/dL (ref 6–20)
CO2: 27 mmol/L (ref 22–32)
Calcium: 8.3 mg/dL — ABNORMAL LOW (ref 8.9–10.3)
Chloride: 98 mmol/L — ABNORMAL LOW (ref 101–111)
Creatinine, Ser: 0.74 mg/dL (ref 0.44–1.00)
GFR calc Af Amer: >60 mL/min (ref >60)
GFR calc non Af Amer: >60 mL/min (ref >60)
Glucose, Bld: 53 mg/dL — ABNORMAL LOW (ref 65–99)
POTASSIUM: 2.9 mmol/L — AB (ref 3.5–5.1)
SODIUM: 135 mmol/L (ref 135–145)
Total Bilirubin: 0.5 mg/dL (ref 0.3–1.2)
Total Protein: 5.4 g/dL — ABNORMAL LOW (ref 6.5–8.1)

## 2014-08-29 LAB — CBC
HCT: 27.9 % — ABNORMAL LOW (ref 35.0–47.0)
HEMOGLOBIN: 9 g/dL — AB (ref 12.0–16.0)
MCH: 26.5 pg (ref 26.0–34.0)
MCHC: 32.1 g/dL (ref 32.0–36.0)
MCV: 82.5 fL (ref 80.0–100.0)
Platelets: 490 10*3/uL — ABNORMAL HIGH (ref 150–440)
RBC: 3.38 MIL/uL — ABNORMAL LOW (ref 3.80–5.20)
RDW: 16.6 % — AB (ref 11.5–14.5)
WBC: 14.2 10*3/uL — ABNORMAL HIGH (ref 3.6–11.0)

## 2014-08-29 LAB — HEPARIN LEVEL (UNFRACTIONATED): Heparin Unfractionated: <0.1 IU/mL — ABNORMAL LOW (ref 0.30–0.70)

## 2014-08-29 LAB — GLUCOSE, CAPILLARY
GLUCOSE-CAPILLARY: 75 mg/dL (ref 65–99)
GLUCOSE-CAPILLARY: 80 mg/dL (ref 65–99)
Glucose-Capillary: 57 mg/dL — ABNORMAL LOW (ref 65–99)
Glucose-Capillary: 87 mg/dL (ref 65–99)
Glucose-Capillary: 151 mg/dL — ABNORMAL HIGH (ref 65–99)

## 2014-08-29 LAB — HEMOGLOBIN A1C: Hgb A1c MFr Bld: 5.9 % (ref 4.0–6.0)

## 2014-08-29 MED ORDER — HEPARIN BOLUS VIA INFUSION
1700.0000 [IU] | Freq: Once | INTRAVENOUS | Status: AC
  Administered 2014-08-29: 1700 [IU] via INTRAVENOUS
  Filled 2014-08-29: qty 1700

## 2014-08-29 MED ORDER — REGADENOSON 0.4 MG/5ML IV SOLN
0.4000 mg | Freq: Once | INTRAVENOUS | Status: AC
  Administered 2014-08-29: 0.4 mg via INTRAVENOUS
  Filled 2014-08-29: qty 5

## 2014-08-29 MED ORDER — METFORMIN HCL 500 MG PO TABS: 1000.0000 mg | ORAL_TABLET | Freq: Two times a day (BID) | ORAL | Status: DC

## 2014-08-29 MED ORDER — HEPARIN (PORCINE) IN NACL 100-0.45 UNIT/ML-% IJ SOLN: 850.0000 [IU]/h | INTRAMUSCULAR | Status: DC

## 2014-08-29 MED ORDER — TECHNETIUM TC 99M SESTAMIBI - CARDIOLITE
13.8500 | Freq: Once | INTRAVENOUS | Status: AC | PRN
  Administered 2014-08-29: 13.85 via INTRAVENOUS

## 2014-08-29 MED ORDER — TECHNETIUM TC 99M SESTAMIBI - CARDIOLITE
30.0000 | Freq: Once | INTRAVENOUS | Status: AC | PRN
  Administered 2014-08-29: 30.67 via INTRAVENOUS

## 2014-08-29 MED ORDER — POTASSIUM CHLORIDE CRYS ER 20 MEQ PO TBCR
40.0000 meq | EXTENDED_RELEASE_TABLET | Freq: Once | ORAL | Status: AC
  Administered 2014-08-29: 40 meq via ORAL
  Filled 2014-08-29: qty 2

## 2014-08-29 MED ORDER — POTASSIUM CHLORIDE CRYS ER 20 MEQ PO TBCR
40.0000 meq | EXTENDED_RELEASE_TABLET | Freq: Two times a day (BID) | ORAL | Status: DC
  Filled 2014-08-29: qty 2

## 2014-08-29 MED ORDER — HEPARIN (PORCINE) IN NACL 100-0.45 UNIT/ML-% IJ SOLN
1050.0000 [IU]/h | INTRAMUSCULAR | Status: DC
  Filled 2014-08-29 (×2): qty 250

## 2014-08-29 MED ORDER — ONDANSETRON HCL 4 MG/2ML IJ SOLN
4.0000 mg | Freq: Once | INTRAMUSCULAR | Status: AC
  Administered 2014-08-29: 4 mg via INTRAVENOUS
  Filled 2014-08-29: qty 2

## 2014-08-29 MED ORDER — ENSURE ENLIVE PO LIQD
237.0000 mL | Freq: Three times a day (TID) | ORAL | Status: DC
  Administered 2014-08-29: 237 mL via ORAL

## 2014-08-29 NOTE — Care Management (Signed)
No discharge needs identified 

## 2014-08-29 NOTE — Discharge Summary (Signed)
[facility] [place] NAME: Sandy [name]    MR#:  [id number]  DATE OF BIRTH:  17-Sep-1945  SUBJECTIVE:  CHIEF COMPLAINT:   Chief Complaint  Patient presents with  . Chest Pain    REVIEW OF SYSTEMS:  CONSTITUTIONAL: No fever, fatigue or weakness. Lost 50 Lb in last 1 year. EYES: No blurred or double vision.  EARS, NOSE, AND THROAT: No tinnitus or ear pain.  RESPIRATORY: No cough, shortness of breath, wheezing or hemoptysis.  CARDIOVASCULAR: No chest pain, orthopnea, edema.  GASTROINTESTINAL: No nausea, vomiting, diarrhea or abdominal pain.  GENITOURINARY: No dysuria, hematuria.  ENDOCRINE: No polyuria, nocturia,  HEMATOLOGY: No anemia, easy bruising or bleeding SKIN: No rash or lesion. MUSCULOSKELETAL: No joint pain or arthritis.   NEUROLOGIC: No tingling, numbness, weakness.  PSYCHIATRY: No anxiety or depression.   ROS  DRUG ALLERGIES:  No Known Allergies  VITALS:  Blood pressure 108/71, pulse 69, temperature 98.5 F (36.9 C), temperature source Oral, resp. rate 23, height 5\' 5"  (1.651 m), weight 56.11 kg (123 lb 11.2 oz), SpO2 97 %.  PHYSICAL EXAMINATION:  GENERAL:  69 y.o.-year-old patient lying in the bed with no acute distress.  EYES: Pupils equal, round, reactive to light and accommodation. No scleral icterus. Extraocular muscles intact.  HEENT: Head atraumatic, normocephalic. Oropharynx and nasopharynx clear.  NECK:  Supple, no jugular venous distention. No thyroid enlargement, no tenderness.  LUNGS: Normal breath sounds bilaterally, no wheezing, rales,rhonchi or crepitation. No use of accessory muscles of respiration.  CARDIOVASCULAR: S1, S2 normal. No murmurs, rubs, or gallops.  ABDOMEN: Soft, nontender, nondistended. Bowel sounds present. No organomegaly or mass.  EXTREMITIES: No pedal edema, cyanosis, or clubbing.  NEUROLOGIC: Cranial nerves II through XII are intact. Muscle strength 5/5 in all extremities.  Sensation intact. Gait not checked.  PSYCHIATRIC: The patient is alert and oriented x 3.  SKIN: No obvious rash, lesion, or ulcer.   Physical Exam LABORATORY PANEL:   CBC  Recent Labs Lab 08/29/14 0213  WBC 14.2*  HGB 9.0*  HCT 27.9*  PLT 490*   ------------------------------------------------------------------------------------------------------------------  Chemistries   Recent Labs Lab 08/28/14 1150  08/29/14 0213  NA 142  --  135  K 2.9*  < > 2.9*  CL 103  --  98*  CO2 29  --  27  GLUCOSE 97  --  53*  BUN 13  --  12  CREATININE 0.89  --  0.74  CALCIUM 8.9  --  8.3*  MG 2.0  --   --   AST 24  --  19  ALT 11*  --  9*  ALKPHOS 178*  --  163*  BILITOT 0.6  --  0.5  < > = values in this interval not displayed. ------------------------------------------------------------------------------------------------------------------  Cardiac Enzymes  Recent Labs Lab 08/28/14 1150  TROPONINI <0.03   ------------------------------------------------------------------------------------------------------------------  RADIOLOGY:  Dg Chest 2 View  08/28/2014   CLINICAL DATA:  69 year old female presented for colonoscopy, was canceled. Hypotension, left chest pain. Initial encounter.  EXAM: CHEST  2 VIEW  COMPARISON:  09/23/2011 and earlier.  FINDINGS: Stable cardiac size at the upper limits of normal. Other mediastinal contours are within normal limits. Visualized tracheal air column is within normal limits. Stable mild elevation of the right hemidiaphragm. No pneumothorax, pulmonary edema, pleural effusion or confluent pulmonary opacity. Degenerative endplate spurring in the lower thoracic spine. No acute osseous abnormality identified.  IMPRESSION: No acute cardiopulmonary abnormality.   Electronically Signed  By: Genevie Ann M.D.   On: 08/28/2014 13:39   Ct Angio Chest Pe W/cm &/or Wo Cm  08/28/2014   CLINICAL DATA:  hypertension, hyperlipidemia, diabetes mellitus and tobacco  abuse who presents to the hospital with chest pains. Apparently patient was being prepared for colonoscopy by Dr Allen Norris and the sided complaining of intermittent chest pains. She was sent to emergency room for further evaluation not proceeding to the procedure. In the emergency room patient admitted of having chest pains in the left side of her chest under her breast for the past 6 weeks. She does describe being her pain as intermittent, which comes and goes, sometimes very intense, patient tells me that her pain comes whenever she sits or lays down and there was no noted change in pain with walking, deep breathing or eating. In width, with shortness of breath as well as lightheadedness and dizziness. Patient admits of radiation to bilateral jaws as well as bilateral upper extremities. Yesterday the patient had pain which lasted approximately 15 minutes and was 8 out of 10 by intensity. She took some ibuprofen which did help her with pains but today the pain recurred and she was sent to emergency room. In emergency room, her labs revealed normal troponin, however, her EKG showed T depressions in lateral leads, however, not new  EXAM: CT ANGIOGRAPHY CHEST WITH CONTRAST  TECHNIQUE: Multidetector CT imaging of the chest was performed using the standard protocol during bolus administration of intravenous contrast. Multiplanar CT image reconstructions and MIPs were obtained to evaluate the vascular anatomy.  CONTRAST:  158mL OMNIPAQUE IOHEXOL 350 MG/ML SOLN  COMPARISON:  CT abdomen 02/21/2010  FINDINGS: Asymmetric thyroid enlargement left greater than right, incompletely visualized. Left arm contrast injection. SVC is patent. RV/LV ratio less than 1, normal. Dilated central pulmonary arteries. Satisfactory opacification of pulmonary arteries noted, and there is no evidence of pulmonary emboli. Breathing motion degrades some of the images in the lung bases. Patent bilateral pulmonary veins. Left atrial enlargement. Mild  coronary calcifications. Adequate contrast opacification of the thoracic aorta with no evidence of dissection, aneurysm, or stenosis. There is classic 3-vessel brachiocephalic arch anatomy without proximal stenosis. Mild scattered plaque in the arch and descending aorta.  No pleural or pericardial effusion. No hilar or mediastinal adenopathy. Minimal subpleural scarring or dependent atelectasis posteriorly in both lower lobes. Lungs otherwise clear. Spurring in the lower thoracic spine. Sternum intact. Multiple large enhancing liver masses measured up to a 6.4 cm diameter, incompletely visualized. These are new since previous abdomen scan. Remainder of visualized upper abdomen unremarkable.  Review of the MIP images confirms the above findings.  IMPRESSION: 1. 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 Lesions could be approachable for elective outpatient ultrasound-guided biopsy, if clinically appropriate. 2. Negative for acute PE or thoracic aortic dissection 3. Thyromegaly, incompletely visualized. 4. Atherosclerosis, including aortic and coronary artery disease. Please note that although the presence of coronary artery calcium documents the presence of coronary artery disease, the severity of this disease and any potential stenosis cannot be assessed on this non-gated CT examination. Assessment for potential risk factor modification, dietary therapy or pharmacologic therapy may be warranted, if clinically indicated.   Electronically Signed   By: Lucrezia Europe M.D.   On: 08/28/2014 21:25      ASSESSMENT AND PLAN:   * Chest pain   Troponin negative, Tele- non contributory.    Remained pain free.    CT angio reveal- Calcified coronaries.    Stress test- negative.  I spoke to Dr. Erin Fulling- he Cleared her for colonoscopy.    Advised to continue ASA and statin and follow with him in office.  * Weight loss and Liver mass   Discussed with daughter- Pt was already referred to GI by PMD and plan is for  colonoscopy.    * DM   BS runs in lower normal range.    Hold oral.  * Htn   BP stable   Cont home meds.  * Hypokalemia   Replaced  * Smoking   Councelled.   All the records are reviewed and case discussed with Care Management/Social Workerr. Management plans discussed with the patient, family and they are in agreement.  CODE STATUS: Full  TOTAL TIME TAKING CARE OF THIS PATIENT: 40 min minutes.   Spoke to her daughter about discharge today.  Vaughan Basta M.D on 08/29/2014   Between 7am to 6pm - Pager - [phone]  After 6pm go to [email or web address] - password [email or web address] [facility]  Office  [phone]  CC: Primary care physician; [name], MD

## 2014-08-29 NOTE — Progress Notes (Signed)
Lab called to notify of potassium of 2.9, MD- Dr. Reece Levy made aware.  MD ordered potassium 40 mEq po, pt was not able to tolerate IV replacement yesterday.

## 2014-08-29 NOTE — Progress Notes (Signed)
Pt denied any shortness of breath during shift.  Pt reported chest discomfort this morning, scheduled nitro paste applied early.  Pts heparin drip maintained at 6.5 during shift.  Pt requested medication to help her sleep, MD made aware and ordered prn trazadone.  Pt reported medication was effective.  Will continue to monitor.

## 2014-08-29 NOTE — Discharge Instructions (Signed)
Low salt and low sugar diet. Follow with GI clinic with Dr. Allen Norris- for rescheduling colonoscopy and to evaluate more about liver mass( Found on CT chest)

## 2014-08-29 NOTE — Progress Notes (Signed)
ANTICOAGULATION CONSULT NOTE - Initial Consult  Pharmacy Consult for Heparin drip Indication: chest pain/ACS  No Known Allergies  Patient Measurements: Height: 5\' 5"  (165.1 cm) Weight: 123 lb 11.2 oz (56.11 kg) (admisson) IBW/kg (Calculated) : 57 Heparin Dosing Weight: 56.1 kg  Vital Signs: Temp: 98.5 F (36.9 C) (05/18 1132) Temp Source: Oral (05/18 1132) BP: 108/71 mmHg (05/18 1132) Pulse Rate: 69 (05/18 1132)  Labs:  Recent Labs  08/28/14 1150 08/28/14 1645 08/29/14 0213 08/29/14 1132  HGB 9.7*  --  9.0*  --   HCT 30.7*  --  27.9*  --   PLT 548*  --  490*  --   APTT  --  39*  --   --   LABPROT  --  14.6  --   --   INR  --  1.12  --   --   HEPARINUNFRC  --   --  <0.10* <0.10*  CREATININE 0.89  --  0.74  --   TROPONINI <0.03  --   --   --     Estimated Creatinine Clearance: 58 mL/min (by C-G formula based on Cr of 0.74).   Medical History: Past Medical History  Diagnosis Date  . Hypertension   . Diabetes mellitus without complication   . Arthritis      Goal of Therapy:  Heparin level 0.3-0.7 units/ml Monitor platelets by anticoagulation protocol: Yes   Plan:  Give 3300 units bolus x 1 Start heparin infusion at 650 units/hr Check anti-Xa level in 8 hours and daily while on heparin Continue to monitor H&H and platelets - CBC in AM  5/18 02:00 anti-Xa <0.1. 1700 unit bolus and increase rate to 850 units/hr. Recheck at 11:00.  5/18 1132 HL <0.10. 7100 unit bolus and increase rate to 1050 units/hr. Next HLin 8 hrs  at 2130  Chinita Greenland PharmD Clinical Pharmacist 08/29/2014

## 2014-08-29 NOTE — Progress Notes (Signed)
Initial Nutrition Assessment  DOCUMENTATION CODES:     INTERVENTION: Medical Nutrition Supplement: Ensure Enlive TID with meals for additional nutrition Meals and Snacks: Cater to patient preferences   NUTRITION DIAGNOSIS:  Inadequate oral intake related to acute illness as evidenced by per patient/family report.  Pt reports significant weight loss of 60# x 6 months also- unable to determine accuracy or timeframe of this weight loss.  GOAL:  Patient will meet greater than or equal to 90% of their needs  MONITOR:   (Energy Intake, Electrolyte and Renal Profile, Anthropometrics, Digestive Profile, Supplement Acceptance)  REASON FOR ASSESSMENT:  Malnutrition Screening Tool    ASSESSMENT: Reason For Admission: chest pain PMHx:  Past Medical History  Diagnosis Date  . Hypertension   . Diabetes mellitus without complication   . Arthritis     Typical Fluid/ Food Intake: 100% of meals (x 1 meal) recorded per I/O  Meal/ Snack Patterns: Pt reports eating a regular diet at home with no restrictions. She also stated that she drinks 2-4 Ensure per day. States that her appetite isn't what it used to be and "can't figure out why she is losing weight".   Supplements: reports drinking 2-4 Ensure each day PTA  Labs:  Electrolyte and Renal Profile:    Recent Labs Lab 08/28/14 1150 08/28/14 1645 08/29/14 0213  BUN 13  --  12  CREATININE 0.89  --  0.74  NA 142  --  135  K 2.9* 3.3* 2.9*  MG 2.0  --   --    Protein Profile:  Recent Labs Lab 08/28/14 1150 08/29/14 0213  ALBUMIN 2.4* 2.1*   Glucose Profile:  Recent Labs  08/29/14 0552 08/29/14 0709 08/29/14 1142  GLUCAP 87 75 80    Meds: Glucophage  UOP:  Intake/Output Summary (Last 24 hours) at 08/29/14 1425 Last data filed at 08/29/14 1230  Gross per 24 hour  Intake    240 ml  Output    351 ml  Net   -111 ml   Physical Findings:  Unable to complete Nutrition-Focused physical exam at this time.    Weight Changes: Pt reports a UBW of 183# x 6 months ago (nov 2015). Unable to verify accuracy of this. Per MD note, patient is positive for weight loss. Review of medical records from 2012- pt was 180.5# x 4 years ago. No other recent records provide weight history to compare.   Height:  Ht Readings from Last 1 Encounters:  08/28/14 5\' 5"  (1.651 m)    Weight:  Wt Readings from Last 1 Encounters:  08/28/14 123 lb 11.2 oz (56.11 kg)    Ideal Body Weight:  56.82 kg  Wt Readings from Last 10 Encounters:  08/28/14 123 lb 11.2 oz (56.11 kg)    BMI:  Body mass index is 20.58 kg/(m^2).  Estimated Nutritional Needs:  Kcal:  1295-1555 kcal/ day (BEE: 1079 x 1.2 AF x 1.0-1.2 IF)  Protein:  56-67 g Pro/day (1.0-1.2 g/ kg/ day)  Fluid:  1397-1677 ml/ day (25-30 ml/ kg)  Skin:  Reviewed, no issues  Diet Order:  Diet Heart Room service appropriate?: Yes; Fluid consistency:: Thin  EDUCATION NEEDS:  No education needs identified at this time   Intake/Output Summary (Last 24 hours) at 08/29/14 1421 Last data filed at 08/29/14 1230  Gross per 24 hour  Intake    240 ml  Output    351 ml  Net   -111 ml    Last BM:  5/17  Linus Orn  Wallace Keller, RDN Pager: [phone] Office: [place]

## 2014-08-29 NOTE — Progress Notes (Signed)
Pt's blood draw glucose resulted at 53, finger stick done, sugar resulted at 56. MD, Dr Reece Levy notified, MD informed pt NPO for myoview, MD ordered to give one cup of orange juice.

## 2014-08-29 NOTE — Progress Notes (Signed)
ANTICOAGULATION CONSULT NOTE - Initial Consult  Pharmacy Consult for Heparin drip Indication: chest pain/ACS  No Known Allergies  Patient Measurements: Height: 5\' 5"  (165.1 cm) Weight: 123 lb 11.2 oz (56.11 kg) (admisson) IBW/kg (Calculated) : 57 Heparin Dosing Weight: 56.1 kg  Vital Signs: Temp: 97.4 F (36.3 C) (05/18 0417) Temp Source: Oral (05/18 0417) BP: 125/64 mmHg (05/18 0417) Pulse Rate: 63 (05/18 0417)  Labs:  Recent Labs  08/28/14 1150 08/28/14 1645 08/29/14 0213  HGB 9.7*  --  9.0*  HCT 30.7*  --  27.9*  PLT 548*  --  490*  APTT  --  39*  --   LABPROT  --  14.6  --   INR  --  1.12  --   HEPARINUNFRC  --   --  <0.10*  CREATININE 0.89  --  0.74  TROPONINI <0.03  --   --     Estimated Creatinine Clearance: 58 mL/min (by C-G formula based on Cr of 0.74).   Medical History: Past Medical History  Diagnosis Date  . Hypertension   . Diabetes mellitus without complication   . Arthritis      Goal of Therapy:  Heparin level 0.3-0.7 units/ml Monitor platelets by anticoagulation protocol: Yes   Plan:  Give 3300 units bolus x 1 Start heparin infusion at 650 units/hr Check anti-Xa level in 8 hours and daily while on heparin Continue to monitor H&H and platelets - CBC in AM   Rayna Sexton, PharmD, BCPS Clinical Pharmacist 08/29/2014,5:06 AM  5/18 02:00 anti-Xa <0.1. 1700 unit bolus and increase rate to 850 units/hr. Recheck at 11:00.

## 2014-08-29 NOTE — Progress Notes (Signed)
Discharge instructions along with home medication list and follow up gone over with patient and daughter. Both verbalized that they understood instruction. No rx given to patient. Iv removed x2. Telemetry removed. No c/o pain. C/o nausea however given zofran x1, no emesis. Patient to be discharged home on ra. [place]

## 2014-08-29 NOTE — Progress Notes (Signed)
Inpatient Diabetes Program Recommendations  AACE/ADA: New Consensus Statement on Inpatient Glycemic Control (2013)  Target Ranges:  Prepandial:   less than 140 mg/dL      Peak postprandial:   less than 180 mg/dL (1-2 hours)      Critically ill patients:  140 - 180 mg/dL   Reason for Assessment:  Results for Sandy [name], Sandy [name] (MRN [id number]) as of 08/29/2014 11:10  Ref. Range 08/28/2014 16:49 08/28/2014 20:15 08/29/2014 05:18 08/29/2014 05:52 08/29/2014 07:09  Glucose-Capillary Latest Ref Range: 65-99 mg/dL 73 101 (H) 57 (L) 87 75  Hypoglycemia. Results for Sandy [name], Sandy [name] (MRN [id number]) as of 08/29/2014 11:10  Ref. Range 08/28/2014 16:45  Hemoglobin A1C Latest Ref Range: 4.0-6.0 % 5.9   Diabetes history: Type 2 diabetes Outpatient Diabetes medications: Glucotrol 10 mg daily, Metformin 1000 mg bid, Actos 30 mg daily Current orders for Inpatient glycemic control:   Note CBG's are low.  Consider holding all diabetes oral agents while patient is in the hospital.  Also may consider reducing Novolog correction to sensitive tid with meals.  Thanks, Adah Perl, RN, BC-ADM Inpatient Diabetes Coordinator Pager [phone] (8a-5p)

## 2014-08-29 NOTE — Procedures (Signed)
Patient underwent a lysis infusion. EKG without evidence of significant EKG changes. There was no evidence of chest pain or other rhythm disturbances  Myocardial perfusion study shows normal LV systolic function without evidence of myocardial ischemia, but at rest at peak stress  Conclusion  Normal LEXISCAn infusion stress test without evidence of myocardial ischemia

## 2014-08-29 NOTE — Discharge Summary (Signed)
[facility] NAME: Sandy [name]    MR#:  [id number]  DATE OF BIRTH:  07/23/1945  DATE OF ADMISSION:  08/28/2014 ADMITTING PHYSICIAN: Theodoro Grist, MD  DATE OF DISCHARGE: 08/29/2014  PRIMARY CARE PHYSICIAN: [name], MD    ADMISSION DIAGNOSIS:  Hypokalemia [E87.6] Chest pain [R07.9] Ischemic chest pain [I20.9]  DISCHARGE DIAGNOSIS:  Principal Problem:   Chest pain   SECONDARY DIAGNOSIS:   Past Medical History  Diagnosis Date  . Hypertension   . Diabetes mellitus without complication   . Arthritis     HOSPITAL COURSE:   * Chest pain  Troponin negative, Tele- non contributory.  Remained pain free.  CT angio reveal- Calcified coronaries.  Stress test- negative.  I spoke to Dr. Erin Fulling- he Cleared her for colonoscopy.  Advised to continue ASA and statin and follow with him in office.  * Weight loss and Liver mass  Discussed with daughter- Pt was already referred to GI by PMD and plan is for colonoscopy.   * DM  BS runs in lower normal range.  Hold oral.  * Htn  BP stable  Cont home meds.  * Hypokalemia  Replaced  * Smoking  Councelled.  DISCHARGE CONDITIONS:   stable  CONSULTS OBTAINED:  Treatment Team:  Corey Skains, MD  DRUG ALLERGIES:  No Known Allergies  DISCHARGE MEDICATIONS:   Current Discharge Medication List    CONTINUE these medications which have NOT CHANGED   Details  amLODipine (NORVASC) 10 MG tablet Take 10 mg by mouth daily.    aspirin EC 81 MG tablet Take 81 mg by mouth daily.    atorvastatin (LIPITOR) 40 MG tablet Take 40 mg by mouth daily.    lisinopril-hydrochlorothiazide (PRINZIDE,ZESTORETIC) 20-25 MG per tablet Take 1 tablet by mouth daily.    metFORMIN (GLUCOPHAGE) 1000 MG tablet Take 1,000 mg by mouth 2 (two) times daily.    metoprolol (TOPROL-XL) 200 MG 24 hr tablet Take 200 mg by mouth daily.    Multiple Vitamin  (MULTIVITAMIN WITH MINERALS) TABS tablet Take 1 tablet by mouth daily.      STOP taking these medications     glipiZIDE (GLUCOTROL) 10 MG tablet      ibuprofen (ADVIL,MOTRIN) 200 MG tablet      pioglitazone (ACTOS) 30 MG tablet          DISCHARGE INSTRUCTIONS:    Follow with Gi clinic for colonoscopy and mass in liver Follow with cardio clinic in 2 weeks with Dr. Thana Farr.  If you experience worsening of your admission symptoms, develop shortness of breath, life threatening emergency, suicidal or homicidal thoughts you must seek medical attention immediately by calling 911 or calling your MD immediately  if symptoms less severe.  You Must read complete instructions/literature along with all the possible adverse reactions/side effects for all the Medicines you take and that have been prescribed to you. Take any new Medicines after you have completely understood and accept all the possible adverse reactions/side effects.   Please note  You were cared for by a hospitalist during your hospital stay. If you have any questions about your discharge medications or the care you received while you were in the hospital after you are discharged, you can call the unit and asked to speak with the hospitalist on call if the hospitalist that took care of you is not available. Once you are discharged, your primary care physician will handle any further medical issues.  Please note that NO REFILLS for any discharge medications will be authorized once you are discharged, as it is imperative that you return to your primary care physician (or establish a relationship with a primary care physician if you do not have one) for your aftercare needs so that they can reassess your need for medications and monitor your lab values.    Today   CHIEF COMPLAINT:   Chief Complaint  Patient presents with  . Chest Pain    HISTORY OF PRESENT ILLNESS:  Sandy [name]  is a 69 y.o. female with a known history of  hypertension, hyperlipidemia, diabetes mellitus and tobacco abuse who presents to the hospital with chest pains. Apparently patient was being prepared for colonoscopy by Dr Allen Norris and the sided complaining of intermittent chest pains. She was sent to emergency room for further evaluation not proceeding to the procedure. In the emergency room patient admitted of having chest pains in the left side of her chest under her breast for the past 6 weeks. She does describe being her pain as intermittent, which comes and goes, sometimes very intense, patient tells me that her pain comes whenever she sits or lays down and there was no noted change in pain with walking, deep breathing or eating. In width, with shortness of breath as well as lightheadedness and dizziness. Patient admits of radiation to bilateral jaws as well as bilateral upper extremities. Yesterday the patient had pain which lasted approximately 15 minutes and was 8 out of 10 by intensity. She took some ibuprofen which did help her with pains but today the pain recurred and she was sent to emergency room. In emergency room, her labs revealed normal troponin, however, her EKG showed T depressions in lateral leads, however, not new. Hospitalist services were contacted for admission.   VITAL SIGNS:  Blood pressure 108/71, pulse 69, temperature 98.5 F (36.9 C), temperature source Oral, resp. rate 23, height 5\' 5"  (1.651 m), weight 56.11 kg (123 lb 11.2 oz), SpO2 97 %.  I/O:   Intake/Output Summary (Last 24 hours) at 08/29/14 1614 Last data filed at 08/29/14 1230  Gross per 24 hour  Intake    240 ml  Output    351 ml  Net   -111 ml    PHYSICAL EXAMINATION:  GENERAL:  69 y.o.-year-old patient lying in the bed with no acute distress.  EYES: Pupils equal, round, reactive to light and accommodation. No scleral icterus. Extraocular muscles intact.  HEENT: Head atraumatic, normocephalic. Oropharynx and nasopharynx clear.  NECK:  Supple, no jugular  venous distention. No thyroid enlargement, no tenderness.  LUNGS: Normal breath sounds bilaterally, no wheezing, rales,rhonchi or crepitation. No use of accessory muscles of respiration.  CARDIOVASCULAR: S1, S2 normal. No murmurs, rubs, or gallops.  ABDOMEN: Soft, non-tender, non-distended. Bowel sounds present. No organomegaly or mass.  EXTREMITIES: No pedal edema, cyanosis, or clubbing.  NEUROLOGIC: Cranial nerves II through XII are intact. Muscle strength 5/5 in all extremities. Sensation intact. Gait not checked.  PSYCHIATRIC: The patient is alert and oriented x 3.  SKIN: No obvious rash, lesion, or ulcer.   DATA REVIEW:   CBC  Recent Labs Lab 08/29/14 0213  WBC 14.2*  HGB 9.0*  HCT 27.9*  PLT 490*    Chemistries   Recent Labs Lab 08/28/14 1150  08/29/14 0213  NA 142  --  135  K 2.9*  < > 2.9*  CL 103  --  98*  CO2 29  --  27  GLUCOSE  97  --  53*  BUN 13  --  12  CREATININE 0.89  --  0.74  CALCIUM 8.9  --  8.3*  MG 2.0  --   --   AST 24  --  19  ALT 11*  --  9*  ALKPHOS 178*  --  163*  BILITOT 0.6  --  0.5  < > = values in this interval not displayed.  Cardiac Enzymes  Recent Labs Lab 08/28/14 1150  TROPONINI <0.03    Microbiology Results  No results found for this or any previous visit.  RADIOLOGY:  Dg Chest 2 View  08/28/2014   CLINICAL DATA:  69 year old female presented for colonoscopy, was canceled. Hypotension, left chest pain. Initial encounter.  EXAM: CHEST  2 VIEW  COMPARISON:  09/23/2011 and earlier.  FINDINGS: Stable cardiac size at the upper limits of normal. Other mediastinal contours are within normal limits. Visualized tracheal air column is within normal limits. Stable mild elevation of the right hemidiaphragm. No pneumothorax, pulmonary edema, pleural effusion or confluent pulmonary opacity. Degenerative endplate spurring in the lower thoracic spine. No acute osseous abnormality identified.  IMPRESSION: No acute cardiopulmonary abnormality.    Electronically Signed   By: Genevie Ann M.D.   On: 08/28/2014 13:39   Ct Angio Chest Pe W/cm &/or Wo Cm  08/28/2014   CLINICAL DATA:  hypertension, hyperlipidemia, diabetes mellitus and tobacco abuse who presents to the hospital with chest pains. Apparently patient was being prepared for colonoscopy by Dr Allen Norris and the sided complaining of intermittent chest pains. She was sent to emergency room for further evaluation not proceeding to the procedure. In the emergency room patient admitted of having chest pains in the left side of her chest under her breast for the past 6 weeks. She does describe being her pain as intermittent, which comes and goes, sometimes very intense, patient tells me that her pain comes whenever she sits or lays down and there was no noted change in pain with walking, deep breathing or eating. In width, with shortness of breath as well as lightheadedness and dizziness. Patient admits of radiation to bilateral jaws as well as bilateral upper extremities. Yesterday the patient had pain which lasted approximately 15 minutes and was 8 out of 10 by intensity. She took some ibuprofen which did help her with pains but today the pain recurred and she was sent to emergency room. In emergency room, her labs revealed normal troponin, however, her EKG showed T depressions in lateral leads, however, not new  EXAM: CT ANGIOGRAPHY CHEST WITH CONTRAST  TECHNIQUE: Multidetector CT imaging of the chest was performed using the standard protocol during bolus administration of intravenous contrast. Multiplanar CT image reconstructions and MIPs were obtained to evaluate the vascular anatomy.  CONTRAST:  150mL OMNIPAQUE IOHEXOL 350 MG/ML SOLN  COMPARISON:  CT abdomen 02/21/2010  FINDINGS: Asymmetric thyroid enlargement left greater than right, incompletely visualized. Left arm contrast injection. SVC is patent. RV/LV ratio less than 1, normal. Dilated central pulmonary arteries. Satisfactory opacification of  pulmonary arteries noted, and there is no evidence of pulmonary emboli. Breathing motion degrades some of the images in the lung bases. Patent bilateral pulmonary veins. Left atrial enlargement. Mild coronary calcifications. Adequate contrast opacification of the thoracic aorta with no evidence of dissection, aneurysm, or stenosis. There is classic 3-vessel brachiocephalic arch anatomy without proximal stenosis. Mild scattered plaque in the arch and descending aorta.  No pleural or pericardial effusion. No hilar or mediastinal adenopathy. Minimal subpleural  scarring or dependent atelectasis posteriorly in both lower lobes. Lungs otherwise clear. Spurring in the lower thoracic spine. Sternum intact. Multiple large enhancing liver masses measured up to a 6.4 cm diameter, incompletely visualized. These are new since previous abdomen scan. Remainder of visualized upper abdomen unremarkable.  Review of the MIP images confirms the above findings.  IMPRESSION: 1. 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 Lesions could be approachable for elective outpatient ultrasound-guided biopsy, if clinically appropriate. 2. Negative for acute PE or thoracic aortic dissection 3. Thyromegaly, incompletely visualized. 4. Atherosclerosis, including aortic and coronary artery disease. Please note that although the presence of coronary artery calcium documents the presence of coronary artery disease, the severity of this disease and any potential stenosis cannot be assessed on this non-gated CT examination. Assessment for potential risk factor modification, dietary therapy or pharmacologic therapy may be warranted, if clinically indicated.   Electronically Signed   By: Lucrezia Europe M.D.   On: 08/28/2014 21:25      Management plans discussed with the patient, family and they are in agreement.  CODE STATUS: Full  TOTAL TIME TAKING CARE OF THIS PATIENT: 40 minutes.    Vaughan Basta M.D on 08/29/2014 at 4:14 PM  Between 7am to  6pm - Pager - [phone]  After 6pm go to [email or web address] - password [email or web address] [facility]  Office  [phone]  CC: Primary care physician; [name], MD

## 2014-09-03 DIAGNOSIS — E782 Mixed hyperlipidemia: Secondary | ICD-10-CM | POA: Insufficient documentation

## 2014-09-03 DIAGNOSIS — I10 Essential (primary) hypertension: Secondary | ICD-10-CM | POA: Insufficient documentation

## 2014-09-03 DIAGNOSIS — E119 Type 2 diabetes mellitus without complications: Secondary | ICD-10-CM | POA: Insufficient documentation

## 2014-09-03 DIAGNOSIS — Z87891 Personal history of nicotine dependence: Secondary | ICD-10-CM | POA: Insufficient documentation

## 2014-09-05 ENCOUNTER — Other Ambulatory Visit
Admission: RE | Admit: 2014-09-05 | Discharge: 2014-09-05 | Disposition: A | Discharge status: Home / Self Care | Payer: Medicare Other | Source: Ambulatory Visit | Attending: Urgent Care | Admitting: Urgent Care

## 2014-09-05 DIAGNOSIS — R634 Abnormal weight loss: Secondary | ICD-10-CM | POA: Diagnosis present

## 2014-09-05 DIAGNOSIS — R160 Hepatomegaly, not elsewhere classified: Secondary | ICD-10-CM | POA: Diagnosis not present

## 2014-09-06 LAB — MISC LABCORP TEST (SEND OUT): Labcorp test code: 2303

## 2014-09-06 LAB — CEA: CEA: 11793 ng/mL — AB (ref 0.0–4.7)

## 2014-09-06 LAB — AFP TUMOR MARKER: AFP-Tumor Marker: 1.1 ng/mL (ref 0.0–8.3)

## 2014-09-07 ENCOUNTER — Encounter
Admission: RE | Disposition: A | Discharge status: Home / Self Care | Payer: Self-pay | Source: Ambulatory Visit | Attending: Gastroenterology

## 2014-09-07 ENCOUNTER — Ambulatory Visit
Admission: RE | Admit: 2014-09-07 | Discharge: 2014-09-07 | Disposition: A | Discharge status: Home / Self Care | Payer: Medicare Other | Source: Ambulatory Visit | Attending: Gastroenterology | Admitting: Gastroenterology

## 2014-09-07 ENCOUNTER — Ambulatory Visit: Payer: Medicare Other | Admitting: Anesthesiology

## 2014-09-07 DIAGNOSIS — K769 Liver disease, unspecified: Secondary | ICD-10-CM | POA: Insufficient documentation

## 2014-09-07 DIAGNOSIS — R933 Abnormal findings on diagnostic imaging of other parts of digestive tract: Secondary | ICD-10-CM | POA: Diagnosis not present

## 2014-09-07 DIAGNOSIS — C186 Malignant neoplasm of descending colon: Secondary | ICD-10-CM | POA: Diagnosis not present

## 2014-09-07 DIAGNOSIS — M1990 Unspecified osteoarthritis, unspecified site: Secondary | ICD-10-CM | POA: Diagnosis not present

## 2014-09-07 DIAGNOSIS — F17210 Nicotine dependence, cigarettes, uncomplicated: Secondary | ICD-10-CM | POA: Insufficient documentation

## 2014-09-07 DIAGNOSIS — R634 Abnormal weight loss: Secondary | ICD-10-CM | POA: Diagnosis present

## 2014-09-07 DIAGNOSIS — D125 Benign neoplasm of sigmoid colon: Secondary | ICD-10-CM | POA: Insufficient documentation

## 2014-09-07 DIAGNOSIS — I10 Essential (primary) hypertension: Secondary | ICD-10-CM | POA: Insufficient documentation

## 2014-09-07 DIAGNOSIS — E119 Type 2 diabetes mellitus without complications: Secondary | ICD-10-CM | POA: Insufficient documentation

## 2014-09-07 HISTORY — PX: ESOPHAGOGASTRODUODENOSCOPY: SHX5428

## 2014-09-07 HISTORY — PX: COLONOSCOPY WITH PROPOFOL: SHX5780

## 2014-09-07 LAB — GLUCOSE, CAPILLARY
Glucose-Capillary: 78 mg/dL (ref 65–99)
Glucose-Capillary: 69 mg/dL (ref 65–99)

## 2014-09-07 SURGERY — COLONOSCOPY WITH PROPOFOL
Anesthesia: General

## 2014-09-07 MED ORDER — PROPOFOL INFUSION 10 MG/ML OPTIME
INTRAVENOUS | Status: DC | PRN
  Administered 2014-09-07: 140 ug/kg/min via INTRAVENOUS

## 2014-09-07 MED ORDER — SODIUM CHLORIDE 0.9 % IV SOLN
INTRAVENOUS | Status: DC
  Administered 2014-09-07: 13:00:00 via INTRAVENOUS

## 2014-09-07 MED ORDER — DEXTROSE 50 % IV SOLN
INTRAVENOUS | Status: AC
  Administered 2014-09-07: 50 mL
  Filled 2014-09-07: qty 50

## 2014-09-07 NOTE — Transfer of Care (Signed)
Immediate Anesthesia Transfer of Care Note  Patient: Sandy [name]  Procedure(s) Performed: Procedure(s): COLONOSCOPY WITH PROPOFOL (N/A) ESOPHAGOGASTRODUODENOSCOPY (EGD) (N/A)  Patient Location: endo  Anesthesia Type:General  Level of Consciousness: sedated and responds to stimulation  Airway & Oxygen Therapy: Patient Spontanous Breathing and Patient connected to nasal cannula oxygen  Post-op Assessment: Report given to RN and Post -op Vital signs reviewed and stable  Post vital signs: Reviewed and stable  Last Vitals:  Filed Vitals:   09/07/14 1343  BP: 86/49  Pulse: 63  Temp: 36.2 C  Resp: 14    Complications: No apparent anesthesia complications

## 2014-09-07 NOTE — H&P (Signed)
[facility]  [address] [place], [place] [address] Phone: [phone] Fax : [phone]  Primary Care Physician:  [name], MD Primary Gastroenterologist:  Dr. Allen Norris  Pre-Procedure History & Physical: HPI:  Sandy [name] is a 69 y.o. female is here for an endoscopy and colonoscopy.   Past Medical History  Diagnosis Date  . Hypertension   . Diabetes mellitus without complication   . Arthritis     No past surgical history on file.  Prior to Admission medications   Medication Sig Start Date End Date Taking? Authorizing Provider  amLODipine (NORVASC) 5 MG tablet Take 5 mg by mouth daily.   Yes Historical Provider, MD  aspirin EC 81 MG tablet Take 81 mg by mouth daily.   Yes Historical Provider, MD  atorvastatin (LIPITOR) 40 MG tablet Take 40 mg by mouth daily.   Yes Historical Provider, MD  fluticasone (FLONASE) 50 MCG/ACT nasal spray Place 2 sprays into the nose daily as needed.   Yes Historical Provider, MD  glipiZIDE (GLUCOTROL) 10 MG tablet Take 10 mg by mouth 2 (two) times daily before a meal.   Yes Historical Provider, MD  lisinopril-hydrochlorothiazide (PRINZIDE,ZESTORETIC) 20-25 MG per tablet Take 1 tablet by mouth daily.   Yes Historical Provider, MD  metFORMIN (GLUCOPHAGE) 1000 MG tablet Take 1,000 mg by mouth daily.    Yes Historical Provider, MD  metoprolol (TOPROL-XL) 200 MG 24 hr tablet Take 200 mg by mouth daily.   Yes Historical Provider, MD  Multiple Vitamin (MULTIVITAMIN WITH MINERALS) TABS tablet Take 1 tablet by mouth daily.   Yes Historical Provider, MD  pioglitazone (ACTOS) 30 MG tablet Take 30 mg by mouth daily.   Yes Historical Provider, MD  zolpidem (AMBIEN) 10 MG tablet Take 1 tablet by mouth at bedtime as needed.   Yes Historical Provider, MD    Allergies as of 09/06/2014  . (No Known Allergies)    No family history on file.  History   Social History  . Marital Status: Single    Spouse Name: N/A  . Number of  Children: N/A  . Years of Education: N/A   Occupational History  . Not on file.   Social History Main Topics  . Smoking status: Current Every Day Smoker -- 0.25 packs/day    Types: Cigarettes  . Smokeless tobacco: Never Used  . Alcohol Use: No  . Drug Use: No  . Sexual Activity: No   Other Topics Concern  . Not on file   Social History Narrative    Review of Systems: See HPI, otherwise negative ROS  Physical Exam: BP 110/66 mmHg  Pulse 63  Temp(Src) 96.3 F (35.7 C)  Resp 16  Ht 5\' 5"  (1.651 m)  Wt 123 lb 11 oz (56.104 kg)  BMI 20.58 kg/m2 General:   Alert,  pleasant and cooperative in NAD Head:  Normocephalic and atraumatic. Neck:  Supple; no masses or thyromegaly. Lungs:  Clear throughout to auscultation.    Heart:  Regular rate and rhythm. Abdomen:  Soft, nontender and nondistended. Normal bowel sounds, without guarding, and without rebound.   Neurologic:  Alert and  oriented x4;  grossly normal neurologically.  Impression/Plan: Sandy [name] is here for an endoscopy and colonoscopy to be performed for Weight loss and liver lesions.  Risks, benefits, limitations, and alternatives regarding  endoscopy and colonoscopy have been reviewed with the patient.  Questions have been answered.  All parties agreeable.   Ollen Bowl, MD  09/07/2014,  1:03 PM

## 2014-09-07 NOTE — Op Note (Signed)
[facility] Gastroenterology Patient Name: Sandy [name] Procedure Date: 09/07/2014 1:18 PM MRN: [id number] Account #: [id number] Date of Birth: 1945/11/29 Admit Type: Outpatient Age: 69 Room: [facility] ENDO ROOM 4 Gender: Female Note Status: Finalized Procedure:         Colonoscopy Indications:       Abnormal CT of the GI tract, Weight loss Providers:         Lucilla Lame, MD Referring MD:      Ellin Goodie, MD (Referring MD) Medicines:         Propofol per Anesthesia Complications:     No immediate complications. Procedure:         Pre-Anesthesia Assessment:                    - Prior to the procedure, a History and Physical was                     performed, and patient medications and allergies were                     reviewed. The patient's tolerance of previous anesthesia                     was also reviewed. The risks and benefits of the procedure                     and the sedation options and risks were discussed with the                     patient. All questions were answered, and informed consent                     was obtained. Prior Anticoagulants: The patient has taken                     no previous anticoagulant or antiplatelet agents. ASA                     Grade Assessment: III - A patient with severe systemic                     disease. After reviewing the risks and benefits, the                     patient was deemed in satisfactory condition to undergo                     the procedure.                    After obtaining informed consent, the colonoscope was                     passed under direct vision. Throughout the procedure, the                     patient's blood pressure, pulse, and oxygen saturations                     were monitored continuously. The Colonoscope was                     introduced through the anus and advanced to the the  descending colon. The colonoscopy was performed without       difficulty. The patient tolerated the procedure well. The                     quality of the bowel preparation was excellent. Findings:      The perianal and digital rectal examinations were normal.      A 15 mm polyp was found in the sigmoid colon. The polyp was       pedunculated. The polyp was removed with a hot snare. Resection and       retrieval were complete. One hemostatic clip was successfully placed.       This was done to prevent bleeding.      A fungating partially obstructing large mass was found in the descending       colon. The mass was circumferential. No bleeding was present. Biopsies       were taken with a cold forceps for histology. Impression:        - One 15 mm polyp in the sigmoid colon. Resected and                     retrieved.                    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 Biopsied. Biopsied. Recommendation:    - Await pathology results. Procedure Code(s): --- Professional ---                    [phone], Sigmoidoscopy, flexible; with removal of tumor(s),                     polyp(s), or other lesion(s) by snare technique                    [id number], 18, Sigmoidoscopy, flexible; with biopsy, single or                     multiple Diagnosis Code(s): --- Professional ---                    R63.4, Abnormal weight loss                    R93.3, Abnormal findings on diagnostic imaging of other                     parts of digestive tract                    D12.5, Benign neoplasm of sigmoid colon                    C18.6, Malignant neoplasm of descending colon CPT copyright 2014 American Medical Association. All rights reserved. The codes documented in this report are preliminary and upon coder review may  be revised to meet current compliance requirements. Lucilla Lame, MD 09/07/2014 1:42:30 PM This report has been signed electronically. Number of Addenda: 0 Note Initiated On: 09/07/2014 1:18 PM Total  Procedure Duration: 0 hours 11 minutes 56 seconds       [facility]

## 2014-09-07 NOTE — Anesthesia Preprocedure Evaluation (Signed)
Anesthesia Evaluation  Patient identified by MRN, date of birth, ID band Patient awake    Reviewed: Allergy & Precautions, H&P , NPO status , Patient's Chart, lab work & pertinent test results, reviewed documented beta blocker date and time   Airway Mallampati: II  TM Distance: >3 FB Neck ROM: full    Dental no notable dental hx.    Pulmonary neg pulmonary ROS, Current Smoker,  breath sounds clear to auscultation  Pulmonary exam normal       Cardiovascular Exercise Tolerance: Good hypertension, negative cardio ROS  Rhythm:regular Rate:Normal     Neuro/Psych negative neurological ROS  negative psych ROS   GI/Hepatic negative GI ROS, Neg liver ROS,   Endo/Other  negative endocrine ROSdiabetes  Renal/GU negative Renal ROS  negative genitourinary   Musculoskeletal  (+) Arthritis -,   Abdominal   Peds  Hematology negative hematology ROS (+)   Anesthesia Other Findings   Reproductive/Obstetrics negative OB ROS                             Anesthesia Physical Anesthesia Plan  ASA: II  Anesthesia Plan: General   Post-op Pain Management:    Induction:   Airway Management Planned:   Additional Equipment:   Intra-op Plan:   Post-operative Plan:   Informed Consent: I have reviewed the patients History and Physical, chart, labs and discussed the procedure including the risks, benefits and alternatives for the proposed anesthesia with the patient or authorized representative who has indicated his/her understanding and acceptance.   Dental Advisory Given  Plan Discussed with: CRNA  Anesthesia Plan Comments:         Anesthesia Quick Evaluation

## 2014-09-09 NOTE — Anesthesia Postprocedure Evaluation (Signed)
  Anesthesia Post-op Note  Patient: Sandy [name]  Procedure(s) Performed: Procedure(s): COLONOSCOPY WITH PROPOFOL (N/A) ESOPHAGOGASTRODUODENOSCOPY (EGD) (N/A)  Anesthesia type:General  Patient location: PACU  Post pain: Pain level controlled  Post assessment: Post-op Vital signs reviewed, Patient's Cardiovascular Status Stable, Respiratory Function Stable, Patent Airway and No signs of Nausea or vomiting  Post vital signs: Reviewed and stable  Last Vitals:  Filed Vitals:   09/07/14 1420  BP: 117/65  Pulse: 86  Temp:   Resp: 12    Level of consciousness: awake, alert  and patient cooperative  Complications: No apparent anesthesia complications

## 2014-09-10 NOTE — Progress Notes (Signed)
Daughter said a little blood in stool.  Low energy. Depressed

## 2014-09-11 ENCOUNTER — Encounter: Payer: Self-pay | Admitting: Gastroenterology

## 2014-09-18 ENCOUNTER — Ambulatory Visit: Payer: Medicare Other

## 2014-09-18 ENCOUNTER — Inpatient Hospital Stay: Payer: Medicare Other | Attending: Hematology and Oncology | Admitting: Hematology and Oncology

## 2014-09-18 ENCOUNTER — Encounter: Payer: Self-pay | Admitting: Hematology and Oncology

## 2014-09-18 VITALS — BP 113/77 | HR 94 | Temp 98.3°F | Ht 65.0 in | Wt 125.9 lb

## 2014-09-18 DIAGNOSIS — R300 Dysuria: Secondary | ICD-10-CM | POA: Insufficient documentation

## 2014-09-18 DIAGNOSIS — Z5111 Encounter for antineoplastic chemotherapy: Secondary | ICD-10-CM | POA: Insufficient documentation

## 2014-09-18 DIAGNOSIS — E041 Nontoxic single thyroid nodule: Secondary | ICD-10-CM | POA: Diagnosis not present

## 2014-09-18 DIAGNOSIS — C787 Secondary malignant neoplasm of liver and intrahepatic bile duct: Secondary | ICD-10-CM | POA: Diagnosis not present

## 2014-09-18 DIAGNOSIS — C189 Malignant neoplasm of colon, unspecified: Secondary | ICD-10-CM | POA: Insufficient documentation

## 2014-09-18 DIAGNOSIS — E119 Type 2 diabetes mellitus without complications: Secondary | ICD-10-CM | POA: Diagnosis not present

## 2014-09-18 DIAGNOSIS — K5900 Constipation, unspecified: Secondary | ICD-10-CM | POA: Insufficient documentation

## 2014-09-18 DIAGNOSIS — M129 Arthropathy, unspecified: Secondary | ICD-10-CM | POA: Diagnosis not present

## 2014-09-18 DIAGNOSIS — R630 Anorexia: Secondary | ICD-10-CM | POA: Diagnosis not present

## 2014-09-18 DIAGNOSIS — G893 Neoplasm related pain (acute) (chronic): Secondary | ICD-10-CM | POA: Insufficient documentation

## 2014-09-18 DIAGNOSIS — R634 Abnormal weight loss: Secondary | ICD-10-CM | POA: Diagnosis not present

## 2014-09-18 DIAGNOSIS — R5383 Other fatigue: Secondary | ICD-10-CM | POA: Diagnosis not present

## 2014-09-18 DIAGNOSIS — Z7982 Long term (current) use of aspirin: Secondary | ICD-10-CM | POA: Insufficient documentation

## 2014-09-18 DIAGNOSIS — Z79899 Other long term (current) drug therapy: Secondary | ICD-10-CM | POA: Insufficient documentation

## 2014-09-18 DIAGNOSIS — I10 Essential (primary) hypertension: Secondary | ICD-10-CM | POA: Insufficient documentation

## 2014-09-18 DIAGNOSIS — R530 Neoplastic (malignant) related fatigue: Secondary | ICD-10-CM | POA: Diagnosis not present

## 2014-09-18 DIAGNOSIS — F17210 Nicotine dependence, cigarettes, uncomplicated: Secondary | ICD-10-CM | POA: Diagnosis not present

## 2014-09-18 DIAGNOSIS — R160 Hepatomegaly, not elsewhere classified: Secondary | ICD-10-CM | POA: Insufficient documentation

## 2014-09-18 DIAGNOSIS — R32 Unspecified urinary incontinence: Secondary | ICD-10-CM | POA: Diagnosis not present

## 2014-09-18 DIAGNOSIS — D649 Anemia, unspecified: Secondary | ICD-10-CM | POA: Insufficient documentation

## 2014-09-18 MED ORDER — OXYCODONE-ACETAMINOPHEN 5-325 MG PO TABS: 1.0000 | ORAL_TABLET | Freq: Four times a day (QID) | ORAL | Status: DC | PRN

## 2014-09-18 NOTE — Progress Notes (Signed)
Pt here today for initial consult for colon cancer; referred by Dr Allen Norris

## 2014-09-19 ENCOUNTER — Telehealth: Payer: Self-pay

## 2014-09-19 ENCOUNTER — Encounter: Payer: Self-pay | Admitting: Hematology and Oncology

## 2014-09-19 ENCOUNTER — Inpatient Hospital Stay: Payer: Medicare Other

## 2014-09-19 DIAGNOSIS — C787 Secondary malignant neoplasm of liver and intrahepatic bile duct: Secondary | ICD-10-CM

## 2014-09-19 DIAGNOSIS — R630 Anorexia: Secondary | ICD-10-CM

## 2014-09-19 DIAGNOSIS — C189 Malignant neoplasm of colon, unspecified: Secondary | ICD-10-CM

## 2014-09-19 DIAGNOSIS — R634 Abnormal weight loss: Secondary | ICD-10-CM

## 2014-09-19 DIAGNOSIS — R300 Dysuria: Secondary | ICD-10-CM

## 2014-09-19 DIAGNOSIS — Z5111 Encounter for antineoplastic chemotherapy: Secondary | ICD-10-CM | POA: Diagnosis not present

## 2014-09-19 DIAGNOSIS — D649 Anemia, unspecified: Secondary | ICD-10-CM

## 2014-09-19 LAB — COMPREHENSIVE METABOLIC PANEL
ALT: 13 U/L — ABNORMAL LOW (ref 14–54)
AST: 29 U/L (ref 15–41)
Albumin: 2.1 g/dL — ABNORMAL LOW (ref 3.5–5.0)
Alkaline Phosphatase: 221 U/L — ABNORMAL HIGH (ref 38–126)
Anion gap: 11 (ref 5–15)
BUN: 22 mg/dL — ABNORMAL HIGH (ref 6–20)
CO2: 27 mmol/L (ref 22–32)
Calcium: 9.6 mg/dL (ref 8.9–10.3)
Chloride: 100 mmol/L — ABNORMAL LOW (ref 101–111)
Creatinine, Ser: 0.78 mg/dL (ref 0.44–1.00)
GFR calc Af Amer: >60 mL/min (ref >60)
GFR calc non Af Amer: >60 mL/min (ref >60)
Glucose, Bld: 73 mg/dL (ref 65–99)
Potassium: 3.7 mmol/L (ref 3.5–5.1)
Sodium: 138 mmol/L (ref 135–145)
Total Bilirubin: 0.6 mg/dL (ref 0.3–1.2)
Total Protein: 6.5 g/dL (ref 6.5–8.1)

## 2014-09-19 LAB — CBC WITH DIFFERENTIAL/PLATELET
Basophils Absolute: 0.1 10*3/uL (ref 0–0.1)
Basophils Relative: 1 %
Eosinophils Absolute: 0 10*3/uL (ref 0–0.7)
Eosinophils Relative: 0 %
HCT: 27.4 % — ABNORMAL LOW (ref 35.0–47.0)
Hemoglobin: 8.8 g/dL — ABNORMAL LOW (ref 12.0–16.0)
Lymphocytes Relative: 6 %
Lymphs Abs: 0.8 10*3/uL — ABNORMAL LOW (ref 1.0–3.6)
MCH: 26 pg (ref 26.0–34.0)
MCHC: 32 g/dL (ref 32.0–36.0)
MCV: 81.2 fL (ref 80.0–100.0)
Monocytes Absolute: 1 10*3/uL — ABNORMAL HIGH (ref 0.2–0.9)
Monocytes Relative: 7 %
Neutro Abs: 12.4 10*3/uL — ABNORMAL HIGH (ref 1.4–6.5)
Neutrophils Relative %: 86 %
Platelets: 844 10*3/uL — ABNORMAL HIGH (ref 150–440)
RBC: 3.38 MIL/uL — ABNORMAL LOW (ref 3.80–5.20)
RDW: 16.7 % — ABNORMAL HIGH (ref 11.5–14.5)
WBC: 14.3 10*3/uL — ABNORMAL HIGH (ref 3.6–11.0)

## 2014-09-19 LAB — URINALYSIS COMPLETE WITH MICROSCOPIC (ARMC ONLY)
Bilirubin Urine: NEGATIVE
Glucose, UA: NEGATIVE mg/dL
Hgb urine dipstick: NEGATIVE
Ketones, ur: NEGATIVE mg/dL
Leukocytes, UA: NEGATIVE
Nitrite: NEGATIVE
Protein, ur: NEGATIVE mg/dL
Specific Gravity, Urine: 1.017 (ref 1.005–1.030)
WBC, UA: NONE SEEN WBC/hpf (ref 0–5)
pH: 5 (ref 5.0–8.0)

## 2014-09-19 LAB — IRON AND TIBC
Iron: 18 ug/dL — ABNORMAL LOW (ref 28–170)
Saturation Ratios: 8 % — ABNORMAL LOW (ref 10.4–31.8)
TIBC: 224 ug/dL — ABNORMAL LOW (ref 250–450)
UIBC: 206 ug/dL

## 2014-09-19 LAB — ABO/RH: ABO/RH(D): O POS

## 2014-09-19 LAB — TYPE AND SCREEN
ABO/RH(D): O POS
Antibody Screen: NEGATIVE

## 2014-09-19 LAB — VITAMIN B12: Vitamin B-12: 336 pg/mL (ref 180–914)

## 2014-09-19 LAB — FOLATE: Folate: 9 ng/mL (ref >5.9)

## 2014-09-19 LAB — FERRITIN: Ferritin: 246 ng/mL (ref 11–307)

## 2014-09-19 NOTE — Telephone Encounter (Signed)
I spoke with the patient's daughter Ranessa Kosta about having her come in to see Dr Bary Castilla tomorrow at 4 pm to discuss having her port placement done on 09/24/14. She is in agreement with this. The patient is scheduled for surgery at [facility] on 09/24/14. She will pre admit at the hospital the same day at 11:00 am in Pre admit testing. She will have a pre op visit here in the office tomorrow 09/20/14 at 4:00 pm. Patient and Daughter are aware of dates, times, and instructions.

## 2014-09-19 NOTE — Progress Notes (Signed)
[facility] day:  09/18/2014  Chief Complaint: Sandy [name] is an 69 y.o. female with a new diagnosis of colon cancer who is referred for by Dr Allen Norris for assessment and management.  HPI: The patient began to lose her appetite in November or December 2015.  She has lost approximatety 53 pounds since that time.  She denied any nausea, vomiting, change in bowel habits, or GI bleeding (melena or hematochezia).  The patient's family notes that she looked pale and was weak. She was seen by her primary care physician, Dr. Kingsley Spittle at the [facility]. It is unclear if she had anemia. She was referred for colonoscopy. This was her first colonoscopy.  She was admitted to [facility] from 5/17-5/18/2016 with left sided chest pain during her colonoscopy prep.  EKG revealed no acute changes.  Troponins were negative.  Stress test was negative.  Chest CT angiogram on 08/28/2014 revealed no evidence of pulmonary embolism, but multiple large enhancing liver masses (largest 6.4 cm).  She was cleared for colonoscopy.  CEA on 09/05/2014 was 11,793.  She underwent colonoscopy on 09/07/2014 by Dr. Lucilla Lame.  There was a circumferential partially obstructive fungating mass in the descending colon.  There was no active bleeding.  In addition, there was a 1.5 cm pedunculated polyp in the sigmoid colon.  Pathology from the descending colon mass revealed fragments of adenocarcinoma.  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  Symptomatically, she has upper abdominal pain (level 6 of 10).  She takes Tylenol 500 mg and Ibuprofen 200 mg without improvement in her pain.  She denies any diarrhea or constipation.  She states that her bowel movements are good, but "smell bad" (new).  She notes difficulty "holding her urine" (similar symptoms to previous UTI).  She denies any lower extremity numbness or weakness.  She notes  fatigue and a lack of appetite.  "All I want to do is sleep".  Her daughter is working on her diet.  Past Medical History  Diagnosis Date  . Hypertension   . Diabetes mellitus without complication   . Arthritis     Past Surgical History  Procedure Laterality Date  . Colonoscopy with propofol N/A 09/07/2014    Procedure: COLONOSCOPY WITH PROPOFOL;  Surgeon: Lucilla Lame, MD;  Location: [facility] ENDOSCOPY;  Service: Endoscopy;  Laterality: N/A;  . Esophagogastroduodenoscopy N/A 09/07/2014    Procedure: ESOPHAGOGASTRODUODENOSCOPY (EGD);  Surgeon: Lucilla Lame, MD;  Location: [facility] ENDOSCOPY;  Service: Endoscopy;  Laterality: N/A;    Family History  Problem Relation Age of Onset  . Cancer Sister     Social History:  reports that she has been smoking Cigarettes.  She has been smoking about 0.25 packs per day. She has never used smokeless tobacco. She reports that she does not drink alcohol or use illicit drugs.  She is a retired [employer] manager.  The patient is accompanied by her 2 daughters Kennyth Lose and Jerome).  She lives in [place] with her daughter, Kennyth Lose.  Allergies: No Known Allergies  Current Medications: Current Outpatient Prescriptions  Medication Sig Dispense Refill  . amLODipine (NORVASC) 5 MG tablet Take 5 mg by mouth daily.    Marland Kitchen atorvastatin (LIPITOR) 40 MG tablet Take 40 mg by mouth daily.    Marland Kitchen glipiZIDE (GLUCOTROL) 10 MG tablet Take 10 mg by mouth 2 (two) times daily before a meal.    . lisinopril-hydrochlorothiazide (PRINZIDE,ZESTORETIC) 20-25 MG per tablet Take 1 tablet  by mouth daily.    . metFORMIN (GLUCOPHAGE) 1000 MG tablet Take 1,000 mg by mouth daily.     . metoprolol (TOPROL-XL) 200 MG 24 hr tablet Take 200 mg by mouth daily.    . Multiple Vitamin (MULTIVITAMIN WITH MINERALS) TABS tablet Take 1 tablet by mouth daily.    . pioglitazone (ACTOS) 30 MG tablet Take 30 mg by mouth daily.    Marland Kitchen zolpidem (AMBIEN) 10 MG tablet Take 1 tablet by mouth at bedtime as needed.    Marland Kitchen aspirin EC 81  MG tablet Take 81 mg by mouth daily.    . fluticasone (FLONASE) 50 MCG/ACT nasal spray Place 2 sprays into the nose daily as needed.    Marland Kitchen oxyCODONE-acetaminophen (PERCOCET/ROXICET) 5-325 MG per tablet Take 1 tablet by mouth every 6 (six) hours as needed for severe pain. 20 tablet 0   No current facility-administered medications for this visit.    Review of Systems:  GENERAL:  Tired all of the time.  Stays in bed much of the day.  Weight down 53# in 6 months. No fevers or sweats. PERFORMANCE STATUS (ECOG):  2-3. HEENT:  Wears a hearing aide.  No visual changes, runny nose, sore throat, mouth sores or tenderness. Lungs: No shortness of breath or cough.  No hemoptysis. Cardiac:  No chest pain, palpitations, orthopnea, or PND. GI:  Upper abdominal pain.  No nausea, vomiting, diarrhea, constipation, melena or hematochezia. GU:  Urgency and frequency.  No dysuria or hematuria. Musculoskeletal:  No back pain.  No joint pain.  No muscle tenderness. Extremities:  No pain or swelling. Skin:  Upper abdominal rash (old). Neuro:  Feels coordination may be off.  No headache, numbness or weakness, or balance issues. Endocrine:  No diabetes, thyroid issues, hot flashes or night sweats. Psych:  No mood changes, depression or anxiety. Pain: Upper abdominal pain. Review of systems:  All other systems reviewed and found to be negative.   Physical Exam: Blood pressure 113/77, pulse 94, temperature 98.3 F (36.8 C), temperature source Tympanic, height 5\' 5"  (1.651 m), weight 125 lb 14.1 oz (57.099 kg).  GENERAL:  Chronically ill appearing woman sitting comfortably in the exam room in no acute distress. MENTAL STATUS:  Alert and oriented to person, place and time. HEAD:  Brown hair.  Normocephalic, atraumatic, face symmetric, no Cushingoid features. EYES:  Glasses.  Brown eyes.  Pupils equal round and reactive to light and accomodation.  No conjunctivitis or scleral icterus. ENT:  Oropharynx clear without  lesion.  Partials. Tongue normal. Mucous membranes moist.  RESPIRATORY:  Clear to auscultation without rales, wheezes or rhonchi. CARDIOVASCULAR:  Regular rate and rhythm without murmur, rub or gallop. ABDOMEN:  Soft, tender right upper quadrant with liver palpable to 2 fingerbreaths above umbilicus.  No guarding or rebound tenderness.  Left lower quadrant full without discrete mass. SKIN:  Dry elongated oval rash beneath right breast.  EXTREMITIES: No edema, no skin discoloration or tenderness.  No palpable cords. LYMPH NODES: No palpable cervical, supraclavicular, axillary or inguinal adenopathy  NEUROLOGICAL: Unremarkable. PSYCH:  Appropriate.  Office Visit on 09/18/2014  Component Date Value Ref Range Status  . Iron 09/19/2014 18* 28 - 170 ug/dL Final  . TIBC 09/19/2014 224* 250 - 450 ug/dL Final  . Saturation Ratios 09/19/2014 8* 10.4 - 31.8 % Final  . UIBC 09/19/2014 206   Final     Assessment:  Sandy [name] is an 69 y.o. female with metastatic colon cancer.  She presented with  a 53 pound weight loss in 6 months.  Colonoscopy on 09/07/2014 revealed a circumferential partially obstructive fungating mass in the descending colon.  There was no active bleeding.  There was a 1.5 cm pedunculated polyp in the sigmoid colon (tubulovillous adenoma).  Pathology from the descending colon mass revealed fragments of adenocarcinoma.  CEA was 11,793 on 09/05/2014.  Chest CT angiogram on 08/28/2014 revealed no evidence of pulmonary embolism, but multiple large enhancing liver masses (largest 6.4 cm).  Symptomatically, she is fatigued and has no appetite.  She has upper abdominal pain.  She denies any diarrhea, constipation melena, or hematochezia.  She notes difficulty "holding her urine" (similar symptoms to previous UTI).  She denies any lower extremity numbness or weakness.   Plan: 1. Review available imaging (chest CT angiogram), labs, and pathology.  Discuss diagnosis of metastatic colon  cancer.  Discuss additional imaging.  Discuss management of disease.  As colon cancer is not obstructing and appears to have significant disease in liver, discussed initial systemic therapy, followed by surgery.  Discuss FOLFOX chemotherapy every 2 weeks +/- Avastin.  Side effects reviewed in detail.  Discuss port-a-cath placement. 2. Discuss current symptoms and pain management.  Discuss keeping a pain diary with adjustment in pain medications depending on needs.  Discuss appetite and caloric intake.  Discuss Ensure, Boost, Carnation instant breakfast, peanut butter, and other high calorie foods.  Discuss use of Megace if weight does not improve.  Side effects reviewed.  Patient would like to wait a week before starting, 3. Labs:  CBC with diff, CMP, ferritin, iron studies, B12, folate. 4. Urinalysis and culture.   5. Rx:  oxycodone 5/Tylenol 325 q 6 hrs prn pain.  Patient to keep pain diary. 6. Rx: shower chair. 7. Schedule PET scan- staging colon cancer. 8. Consult surgery:  new diagnosis metastatic colon cancer; non-obstructing descending colon lesion, port-a-cath placement. 9. Schedule chemotherapy class. 10. Preauth FOLFOX + Avastin every 2 weeks.  11. [place] referral. 12. Consult Palliative Care Medicine. 13. Present at tumor board. 14. RTC next week for MD assess, labs (CBC, CMP, CEA), and cycle #1 FOLFOX.  Lequita Asal, MD  09/19/2014, 3:20 PM

## 2014-09-19 NOTE — Telephone Encounter (Signed)
-----   Message from Robert Bellow, MD sent at 09/19/2014  2:28 PM EDT ----- Please arrange for the patient to come in tomorrow afternoon about 4 to discuss power port placement.  Schedule for port placement on Monday, DX: Colon cancer for power port placement. Thanks.

## 2014-09-20 ENCOUNTER — Encounter: Payer: Self-pay | Admitting: General Surgery

## 2014-09-20 ENCOUNTER — Ambulatory Visit (INDEPENDENT_AMBULATORY_CARE_PROVIDER_SITE_OTHER): Payer: Medicare Other | Admitting: General Surgery

## 2014-09-20 VITALS — BP 116/56 | HR 62 | Resp 12 | Ht 65.0 in | Wt 126.8 lb

## 2014-09-20 DIAGNOSIS — C787 Secondary malignant neoplasm of liver and intrahepatic bile duct: Secondary | ICD-10-CM | POA: Diagnosis not present

## 2014-09-20 DIAGNOSIS — C801 Malignant (primary) neoplasm, unspecified: Secondary | ICD-10-CM | POA: Diagnosis not present

## 2014-09-20 DIAGNOSIS — C189 Malignant neoplasm of colon, unspecified: Secondary | ICD-10-CM

## 2014-09-20 NOTE — Progress Notes (Signed)
Patient ID: Sandy [name], female   DOB: [birth year], 69 y.o.   MRN: [id number]  Chief Complaint  Patient presents with  . Other    Port Placement     HPI Sandy [name] is a 69 y.o. female here today for evaluation for port placement. Patient will need port treatment for colon cancer sees Dr. Mike Gip. Patient is experiencing night sweats. Per Patient's daughter she was having some anal bleeding which stopped once she had the colonoscopy on 09/07/14. Positive for abdominal pain, patient first noticed weight loss in november. Sandy [name] and Sandy [name] ( [place]) Sandy [name] patient's daughters were present. Patient is right handed. HPI  Past Medical History  Diagnosis Date  . Hypertension   . Diabetes mellitus without complication   . Arthritis     Past Surgical History  Procedure Laterality Date  . Colonoscopy with propofol N/A 09/07/2014    Procedure: COLONOSCOPY WITH PROPOFOL;  Surgeon: Lucilla Lame, MD;  Location: [facility] ENDOSCOPY;  Service: Endoscopy;  Laterality: N/A;  . Esophagogastroduodenoscopy N/A 09/07/2014    Procedure: ESOPHAGOGASTRODUODENOSCOPY (EGD);  Surgeon: Lucilla Lame, MD;  Location: [facility] ENDOSCOPY;  Service: Endoscopy;  Laterality: N/A;    Family History  Problem Relation Age of Onset  . Cancer Sister     Social History History  Substance Use Topics  . Smoking status: Former Smoker -- 0.25 packs/day for 20 years    Types: Cigarettes    Quit date: 04/14/2003  . Smokeless tobacco: Never Used  . Alcohol Use: No    No Known Allergies  Current Outpatient Prescriptions  Medication Sig Dispense Refill  . amLODipine (NORVASC) 5 MG tablet Take 5 mg by mouth daily.    Marland Kitchen aspirin EC 81 MG tablet Take 81 mg by mouth daily.    Marland Kitchen atorvastatin (LIPITOR) 40 MG tablet Take 40 mg by mouth daily.    Marland Kitchen glipiZIDE (GLUCOTROL) 10 MG tablet Take 10 mg by mouth 2 (two) times daily before a meal.    . lisinopril-hydrochlorothiazide (PRINZIDE,ZESTORETIC) 20-25 MG per tablet Take  1 tablet by mouth daily.    . metFORMIN (GLUCOPHAGE) 1000 MG tablet Take 1,000 mg by mouth daily.     . metoprolol (TOPROL-XL) 200 MG 24 hr tablet Take 200 mg by mouth daily.    . Multiple Vitamin (MULTIVITAMIN WITH MINERALS) TABS tablet Take 1 tablet by mouth daily.    Marland Kitchen oxyCODONE-acetaminophen (PERCOCET/ROXICET) 5-325 MG per tablet Take 1 tablet by mouth every 6 (six) hours as needed for severe pain. 20 tablet 0  . pioglitazone (ACTOS) 30 MG tablet Take 30 mg by mouth daily.    Marland Kitchen zolpidem (AMBIEN) 10 MG tablet Take 1 tablet by mouth at bedtime as needed.     No current facility-administered medications for this visit.    Review of Systems Review of Systems  Constitutional: Positive for chills, appetite change, fatigue and unexpected weight change.  Respiratory: Negative.   Cardiovascular: Negative.     Blood pressure 116/56, pulse 62, resp. rate 12, height 5\' 5"  (1.651 m), weight 126 lb 12.8 oz (57.516 kg).  Physical Exam Physical Exam  Constitutional: She is oriented to person, place, and time.  Neck: Neck supple. No thyromegaly present.  Cardiovascular: Normal rate and normal heart sounds.  An irregular rhythm present.  Pulmonary/Chest: Effort normal and breath sounds normal.  Abdominal: Soft. Bowel sounds are normal.  Lymphadenopathy:    She has no cervical adenopathy.  Neurological: She is alert and oriented to person, place, and time.  Skin:  Skin is warm and dry.    Data Reviewed Colonoscopy report. PET/CT. Medical oncology records.  Assessment    Metastatic colon cancer, plans for chemotherapy requiring central venous access.    Plan    The risks associated with central venous access including arterial, pulmonary and venous injury were reviewed. The possible need for additional treatment if pulmonary injury occurs (chest tube placement) was discussed.      The procedure, including it's potential risks and complications were reviewed and discussed.   Port  Placement  has been scheduled for 09/24/14.  OVF:IEPPI, Sandy [name] 09/21/2014, 12:38 PM

## 2014-09-21 DIAGNOSIS — C189 Malignant neoplasm of colon, unspecified: Secondary | ICD-10-CM | POA: Insufficient documentation

## 2014-09-21 DIAGNOSIS — C787 Secondary malignant neoplasm of liver and intrahepatic bile duct: Secondary | ICD-10-CM | POA: Insufficient documentation

## 2014-09-21 LAB — URINE CULTURE

## 2014-09-21 NOTE — H&P (Signed)
Patient ID: Sandy [name], female   DOB: [birth year], 69 y.o.   MRN: [id number]    Chief Complaint   Patient presents with   .  Other       Port Placement       HPI Sandy [name] is a 69 y.o. female here today for evaluation for port placement. Patient will need port treatment for colon cancer sees Dr. Mike Gip. Patient is experiencing night sweats. Per Patient's daughter she was having some anal bleeding which stopped once she had the colonoscopy on 09/07/14. Positive for abdominal pain, patient first noticed weight loss in november. [name] and [name] ( [place]) [name] patient's daughters were present. Patient is right handed. HPI    Past Medical History   Diagnosis  Date   .  Hypertension     .  Diabetes mellitus without complication     .  Arthritis         Past Surgical History   Procedure  Laterality  Date   .  Colonoscopy with propofol  N/A  09/07/2014       Procedure: COLONOSCOPY WITH PROPOFOL;  Surgeon: Lucilla Lame, MD;  Location: [facility] ENDOSCOPY;  Service: Endoscopy;  Laterality: N/A;   .  Esophagogastroduodenoscopy  N/A  09/07/2014       Procedure: ESOPHAGOGASTRODUODENOSCOPY (EGD);  Surgeon: Lucilla Lame, MD;  Location: [facility] ENDOSCOPY;  Service: Endoscopy;  Laterality: N/A;       Family History   Problem  Relation  Age of Onset   .  Cancer  Sister        Social History History   Substance Use Topics   .  Smoking status:  Former Smoker -- 0.25 packs/day for 20 years       Types:  Cigarettes       Quit date:  04/14/2003   .  Smokeless tobacco:  Never Used   .  Alcohol Use:  No      No Known Allergies    Current Outpatient Prescriptions   Medication  Sig  Dispense  Refill   .  amLODipine (NORVASC) 5 MG tablet  Take 5 mg by mouth daily.       Marland Kitchen  aspirin EC 81 MG tablet  Take 81 mg by mouth daily.       Marland Kitchen  atorvastatin (LIPITOR) 40 MG tablet  Take 40 mg by mouth daily.       Marland Kitchen  glipiZIDE (GLUCOTROL) 10 MG tablet  Take 10 mg by mouth 2 (two) times  daily before a meal.       .  lisinopril-hydrochlorothiazide (PRINZIDE,ZESTORETIC) 20-25 MG per tablet  Take 1 tablet by mouth daily.       .  metFORMIN (GLUCOPHAGE) 1000 MG tablet  Take 1,000 mg by mouth daily.        .  metoprolol (TOPROL-XL) 200 MG 24 hr tablet  Take 200 mg by mouth daily.       .  Multiple Vitamin (MULTIVITAMIN WITH MINERALS) TABS tablet  Take 1 tablet by mouth daily.       Marland Kitchen  oxyCODONE-acetaminophen (PERCOCET/ROXICET) 5-325 MG per tablet  Take 1 tablet by mouth every 6 (six) hours as needed for severe pain.  20 tablet  0   .  pioglitazone (ACTOS) 30 MG tablet  Take 30 mg by mouth daily.       Marland Kitchen  zolpidem (AMBIEN) 10 MG tablet  Take 1 tablet by mouth at bedtime as needed.  No current facility-administered medications for this visit.      Review of Systems Review of Systems  Constitutional: Positive for chills, appetite change, fatigue and unexpected weight change.  Respiratory: Negative.   Cardiovascular: Negative.     Blood pressure 116/56, pulse 62, resp. rate 12, height 5\' 5"  (1.651 m), weight 126 lb 12.8 oz (57.516 kg).   Physical Exam Physical Exam  Constitutional: She is oriented to person, place, and time.  Neck: Neck supple. No thyromegaly present.  Cardiovascular: Normal rate and normal heart sounds.  An irregular rhythm present.  Pulmonary/Chest: Effort normal and breath sounds normal.  Abdominal: Soft. Bowel sounds are normal.  Lymphadenopathy:    She has no cervical adenopathy.  Neurological: She is alert and oriented to person, place, and time.  Skin: Skin is warm and dry.    Data Reviewed Colonoscopy report. PET/CT. Medical oncology records.   Assessment Metastatic colon cancer, plans for chemotherapy requiring central venous access.   Plan The risks associated with central venous access including arterial, pulmonary and venous injury were reviewed. The possible need for additional treatment if pulmonary injury occurs (chest  tube placement) was discussed.    The procedure, including it's potential risks and complications were reviewed and discussed.    Port Placement  has been scheduled for 09/24/14.   HEK:BTCYE, Jorene Minors 09/21/2014, 12:38 PM

## 2014-09-24 ENCOUNTER — Ambulatory Visit: Payer: Medicare Other | Admitting: Certified Registered"

## 2014-09-24 ENCOUNTER — Ambulatory Visit
Admission: RE | Admit: 2014-09-24 | Discharge: 2014-09-24 | Disposition: A | Discharge status: Home / Self Care | Payer: Medicare Other | Source: Ambulatory Visit | Attending: General Surgery | Admitting: General Surgery

## 2014-09-24 ENCOUNTER — Ambulatory Visit: Payer: Medicare Other

## 2014-09-24 ENCOUNTER — Encounter: Payer: Self-pay | Admitting: *Deleted

## 2014-09-24 ENCOUNTER — Encounter
Admission: RE | Admit: 2014-09-24 | Discharge: 2014-09-24 | Disposition: A | Discharge status: Home / Self Care | Payer: Medicare Other | Source: Ambulatory Visit | Attending: General Surgery | Admitting: General Surgery

## 2014-09-24 ENCOUNTER — Encounter
Admission: RE | Disposition: A | Discharge status: Home / Self Care | Payer: Self-pay | Source: Ambulatory Visit | Attending: General Surgery

## 2014-09-24 DIAGNOSIS — C189 Malignant neoplasm of colon, unspecified: Secondary | ICD-10-CM | POA: Insufficient documentation

## 2014-09-24 DIAGNOSIS — I10 Essential (primary) hypertension: Secondary | ICD-10-CM | POA: Insufficient documentation

## 2014-09-24 DIAGNOSIS — M1990 Unspecified osteoarthritis, unspecified site: Secondary | ICD-10-CM | POA: Insufficient documentation

## 2014-09-24 DIAGNOSIS — Z79899 Other long term (current) drug therapy: Secondary | ICD-10-CM | POA: Insufficient documentation

## 2014-09-24 DIAGNOSIS — Z809 Family history of malignant neoplasm, unspecified: Secondary | ICD-10-CM | POA: Diagnosis not present

## 2014-09-24 DIAGNOSIS — Z87891 Personal history of nicotine dependence: Secondary | ICD-10-CM | POA: Diagnosis not present

## 2014-09-24 DIAGNOSIS — E119 Type 2 diabetes mellitus without complications: Secondary | ICD-10-CM | POA: Insufficient documentation

## 2014-09-24 DIAGNOSIS — C787 Secondary malignant neoplasm of liver and intrahepatic bile duct: Secondary | ICD-10-CM

## 2014-09-24 DIAGNOSIS — Z95828 Presence of other vascular implants and grafts: Secondary | ICD-10-CM

## 2014-09-24 DIAGNOSIS — C187 Malignant neoplasm of sigmoid colon: Secondary | ICD-10-CM | POA: Diagnosis not present

## 2014-09-24 DIAGNOSIS — C799 Secondary malignant neoplasm of unspecified site: Secondary | ICD-10-CM | POA: Diagnosis not present

## 2014-09-24 HISTORY — PX: PORTACATH PLACEMENT: SHX2246

## 2014-09-24 LAB — GLUCOSE, CAPILLARY
Glucose-Capillary: 106 mg/dL — ABNORMAL HIGH (ref 65–99)
Glucose-Capillary: 87 mg/dL (ref 65–99)

## 2014-09-24 SURGERY — INSERTION, TUNNELED CENTRAL VENOUS DEVICE, WITH PORT
Anesthesia: General | Site: Chest | Laterality: Left | Wound class: Clean

## 2014-09-24 MED ORDER — LIDOCAINE HCL (PF) 1 % IJ SOLN
INTRAMUSCULAR | Status: AC
  Filled 2014-09-24: qty 30

## 2014-09-24 MED ORDER — LACTATED RINGERS IV SOLN: INTRAVENOUS | Status: DC

## 2014-09-24 MED ORDER — PROPOFOL INFUSION 10 MG/ML OPTIME
INTRAVENOUS | Status: DC | PRN
  Administered 2014-09-24: 100 ug/kg/min via INTRAVENOUS

## 2014-09-24 MED ORDER — PROPOFOL 10 MG/ML IV BOLUS
INTRAVENOUS | Status: DC | PRN
  Administered 2014-09-24: 50 mg via INTRAVENOUS

## 2014-09-24 MED ORDER — LIDOCAINE HCL (CARDIAC) 20 MG/ML IV SOLN
INTRAVENOUS | Status: DC | PRN
  Administered 2014-09-24: 60 mg via INTRAVENOUS

## 2014-09-24 MED ORDER — SODIUM CHLORIDE 0.9 % IV SOLN
INTRAVENOUS | Status: DC
  Administered 2014-09-24: 13:00:00 via INTRAVENOUS

## 2014-09-24 MED ORDER — SODIUM CHLORIDE 0.9 % IJ SOLN
INTRAMUSCULAR | Status: DC | PRN
  Administered 2014-09-24: 40 mL

## 2014-09-24 MED ORDER — ONDANSETRON HCL 4 MG/2ML IJ SOLN
INTRAMUSCULAR | Status: DC | PRN
  Administered 2014-09-24: 4 mg via INTRAVENOUS

## 2014-09-24 MED ORDER — EPHEDRINE SULFATE 50 MG/ML IJ SOLN
INTRAMUSCULAR | Status: DC | PRN
  Administered 2014-09-24: 5 mg via INTRAVENOUS

## 2014-09-24 MED ORDER — SODIUM CHLORIDE 0.9 % IJ SOLN
INTRAMUSCULAR | Status: AC
  Filled 2014-09-24: qty 50

## 2014-09-24 MED ORDER — CEFAZOLIN SODIUM-DEXTROSE 2-3 GM-% IV SOLR
2.0000 g | INTRAVENOUS | Status: AC
  Administered 2014-09-24: 2 g via INTRAVENOUS

## 2014-09-24 MED ORDER — HYDROMORPHONE HCL 1 MG/ML IJ SOLN: 0.2500 mg | INTRAMUSCULAR | Status: DC | PRN

## 2014-09-24 MED ORDER — ONDANSETRON HCL 4 MG/2ML IJ SOLN: 4.0000 mg | Freq: Once | INTRAMUSCULAR | Status: DC | PRN

## 2014-09-24 MED ORDER — LIDOCAINE HCL 1 % IJ SOLN
INTRAMUSCULAR | Status: DC | PRN
  Administered 2014-09-24: 10 mL via INTRADERMAL

## 2014-09-24 MED ORDER — MIDAZOLAM HCL 2 MG/2ML IJ SOLN
INTRAMUSCULAR | Status: DC | PRN
  Administered 2014-09-24 (×2): 1 mg via INTRAVENOUS

## 2014-09-24 MED ORDER — PHENYLEPHRINE HCL 10 MG/ML IJ SOLN
INTRAMUSCULAR | Status: DC | PRN
  Administered 2014-09-24: 100 ug via INTRAVENOUS

## 2014-09-24 MED ORDER — FENTANYL CITRATE (PF) 100 MCG/2ML IJ SOLN
INTRAMUSCULAR | Status: DC | PRN
  Administered 2014-09-24 (×2): 25 ug via INTRAVENOUS

## 2014-09-24 MED ORDER — CEFAZOLIN SODIUM-DEXTROSE 2-3 GM-% IV SOLR
INTRAVENOUS | Status: AC
  Administered 2014-09-24: 2 g via INTRAVENOUS
  Filled 2014-09-24: qty 50

## 2014-09-24 SURGICAL SUPPLY — 30 items
BENZOIN TINCTURE PRP APPL 2/3 (GAUZE/BANDAGES/DRESSINGS) ×3 IMPLANT
BLADE SURG 15 STRL SS SAFETY (BLADE) ×3 IMPLANT
CHLORAPREP W/TINT 26ML (MISCELLANEOUS) ×3 IMPLANT
CLOSURE WOUND 1/2 X4 (GAUZE/BANDAGES/DRESSINGS) ×1
COVER LIGHT HANDLE STERIS (MISCELLANEOUS) ×6 IMPLANT
DECANTER SPIKE VIAL GLASS SM (MISCELLANEOUS) ×6 IMPLANT
DRAPE C-ARM XRAY 36X54 (DRAPES) ×3 IMPLANT
DRAPE LAPAROTOMY TRNSV 106X77 (MISCELLANEOUS) ×3 IMPLANT
DRESSING TELFA 4X3 1S ST N-ADH (GAUZE/BANDAGES/DRESSINGS) ×3 IMPLANT
DRSG EMULSION OIL 3X8 NADH (GAUZE/BANDAGES/DRESSINGS) ×3 IMPLANT
DRSG TEGADERM 2-3/8X2-3/4 SM (GAUZE/BANDAGES/DRESSINGS) ×3 IMPLANT
DRSG TEGADERM 2X2.25 PEDS (GAUZE/BANDAGES/DRESSINGS) ×3 IMPLANT
DRSG TEGADERM 4X4.75 (GAUZE/BANDAGES/DRESSINGS) ×3 IMPLANT
GLOVE BIO SURGEON STRL SZ7.5 (GLOVE) ×3 IMPLANT
GLOVE INDICATOR 8.0 STRL GRN (GLOVE) ×3 IMPLANT
GOWN STRL REUS W/ TWL LRG LVL3 (GOWN DISPOSABLE) ×2 IMPLANT
GOWN STRL REUS W/TWL LRG LVL3 (GOWN DISPOSABLE) ×4
KIT RM TURNOVER STRD PROC AR (KITS) ×3 IMPLANT
LABEL OR SOLS (LABEL) ×3 IMPLANT
NS IRRIG 500ML POUR BTL (IV SOLUTION) ×3 IMPLANT
PACK PORT-A-CATH (MISCELLANEOUS) ×3 IMPLANT
PAD GROUND ADULT SPLIT (MISCELLANEOUS) ×3 IMPLANT
PORTACATH POWER 8F (Port) ×3 IMPLANT
STRIP CLOSURE SKIN 1/2X4 (GAUZE/BANDAGES/DRESSINGS) ×2 IMPLANT
SUT PROLENE 3 0 SH DA (SUTURE) ×3 IMPLANT
SUT VIC AB 3-0 SH 27 (SUTURE) ×2
SUT VIC AB 3-0 SH 27X BRD (SUTURE) ×1 IMPLANT
SUT VIC AB 4-0 FS2 27 (SUTURE) ×3 IMPLANT
SWABSTK COMLB BENZOIN TINCTURE (MISCELLANEOUS) ×3 IMPLANT
SYR CONTROL 10ML (SYRINGE) ×6 IMPLANT

## 2014-09-24 NOTE — Progress Notes (Signed)
Dr Benjamine Mola notified re pt not having metoprolol this am - will not give preop as instructed by Dr Benjamine Mola 1230 pm

## 2014-09-24 NOTE — Op Note (Signed)
Preoperative diagnosis: Metastatic colon cancer.  Postoperative diagnosis: Same.  Operative procedure: Left subclavian PowerPort placement with ultrasound and fluoroscopic guidance.  Operating surgeon: Ollen Bowl, M.D.  Anesthesia: Attended local, 10 mL 1% plain Xylocaine.  Clinical note 69-year-old woman with metastatic colon cancer for which IV infusion chemotherapy is planned. Central venous access was requested by the treating oncologist.  Operative note the patient received prior to the procedure. She underwent sedation and was placed supine on the operating table. In Trendelenburg position the neck and chest on the left side was prepped with chlor prep and drape. Ultrasound was used to confirm patency of the subclavian vein. It was a fairly small diameter and this was improved by increasing IV fluids and placing the patient in further Trendelenburg position. The vein was cannulated and the guidewire advanced under fluoroscopic guidance. The catheter track was dilated and the catheter placed at the junction of the SVC and right atrium. It was tunneled to a pocket on the left anterior . The port was anchored to the deep tissue with interrupted 30 proline sutures. The wound was closed with running 3-0 Vicryls sutures to the adipose layer and a running 4-0 Vicryls suture for the skin. Benzoin and Steri-Strips followed by Telfa and Tegaderm dressings were applied.the port was flushed with 10 mL of injectable saline at the end of the procedure.  A rectal chest x-ray was obtained in the recovery room, pending at the time of dictation.

## 2014-09-24 NOTE — Transfer of Care (Signed)
Immediate Anesthesia Transfer of Care Note  Patient: Sandy [name]  Procedure(s) Performed: Procedure(s): INSERTION PORT-A-CATH (Left)  Patient Location: PACU  Anesthesia Type:General  Level of Consciousness: awake, alert , oriented and patient cooperative  Airway & Oxygen Therapy: Patient Spontanous Breathing and Patient connected to face mask oxygen  Post-op Assessment: Report given to RN, Post -op Vital signs reviewed and stable and Patient moving all extremities X 4  Post vital signs: Reviewed and stable  Last Vitals:  Filed Vitals:   09/24/14 1351  BP: 109/65  Pulse: 79  Temp: 36.6 C  Resp: 18    Complications: No apparent anesthesia complications

## 2014-09-24 NOTE — Discharge Instructions (Addendum)
Apply ice to area for comfort.  Tylenol if needed for soreness.  Resume regular diabetic medications tomorrow.  Follow-up with medical oncology as scheduled.  You may shower any time.  The outer plastic dressings can be removed in 3 days if desired.AMBULATORY SURGERY  DISCHARGE INSTRUCTIONS   1) The drugs that you were given will stay in your system until tomorrow so for the next 24 hours you should not:  A) Drive an automobile B) Make any legal decisions C) Drink any alcoholic beverage   2) You may resume regular meals tomorrow.  Today it is better to start with liquids and gradually work up to solid foods.  You may eat anything you prefer, but it is better to start with liquids, then soup and crackers, and gradually work up to solid foods.   3) Please notify your doctor immediately if you have any unusual bleeding, trouble breathing, redness and pain at the surgery site, drainage, fever, or pain not relieved by medication. 4)   5) Your post-operative visit with Dr.                                     is: Date:                        Time:    Please call to schedule your post-operative visit.  6) Additional Instructions:

## 2014-09-24 NOTE — Anesthesia Postprocedure Evaluation (Signed)
  Anesthesia Post-op Note  Patient: Sandy [name]  Procedure(s) Performed: Procedure(s): INSERTION PORT-A-CATH (Left)  Anesthesia type:General  Patient location: PACU  Post pain: Pain level controlled  Post assessment: Post-op Vital signs reviewed, Patient's Cardiovascular Status Stable, Respiratory Function Stable, Patent Airway and No signs of Nausea or vomiting  Post vital signs: Reviewed and stable  Last Vitals:  Filed Vitals:   09/24/14 1351  BP: 109/65  Pulse: 79  Temp: 36.6 C  Resp: 18    Level of consciousness: awake, alert  and patient cooperative  Complications: No apparent anesthesia complications

## 2014-09-24 NOTE — Anesthesia Preprocedure Evaluation (Signed)
Anesthesia Evaluation  Patient identified by MRN, date of birth, ID band Patient awake    Reviewed: Allergy & Precautions, NPO status , Patient's Chart, lab work & pertinent test results  Airway Mallampati: I  TM Distance: >3 FB Neck ROM: Limited    Dental  (+) Partial Upper, Partial Lower   Pulmonary former smoker,    Pulmonary exam normal       Cardiovascular hypertension, Pt. on medications and Pt. on home beta blockers Normal cardiovascular exam    Neuro/Psych    GI/Hepatic One can of ensure this morning at 4 am. It is now 12:38 pm.   Endo/Other  diabetes, Type 2BG 106.  Renal/GU      Musculoskeletal   Abdominal (+)  Abdomen: soft.    Peds  Hematology Hb 8.8.   Anesthesia Other Findings   Reproductive/Obstetrics                             Anesthesia Physical Anesthesia Plan  ASA: IV  Anesthesia Plan: MAC   Post-op Pain Management:    Induction: Intravenous  Airway Management Planned: Simple Face Mask  Additional Equipment:   Intra-op Plan:   Post-operative Plan:   Informed Consent: I have reviewed the patients History and Physical, chart, labs and discussed the procedure including the risks, benefits and alternatives for the proposed anesthesia with the patient or authorized representative who has indicated his/her understanding and acceptance.     Plan Discussed with: CRNA  Anesthesia Plan Comments:         Anesthesia Quick Evaluation

## 2014-09-24 NOTE — H&P (Signed)
69 y/o female with metastatic colon cancer. For power port placement. No change in cardiopulmonary status.

## 2014-09-25 ENCOUNTER — Ambulatory Visit: Payer: Medicare Other

## 2014-09-25 ENCOUNTER — Other Ambulatory Visit: Payer: Medicare Other

## 2014-09-25 NOTE — Patient Instructions (Signed)

## 2014-09-27 ENCOUNTER — Ambulatory Visit: Admission: RE | Admit: 2014-09-27 | Payer: Medicare Other | Source: Ambulatory Visit

## 2014-09-27 ENCOUNTER — Telehealth: Payer: Self-pay

## 2014-09-27 ENCOUNTER — Ambulatory Visit: Payer: Medicare Other

## 2014-09-27 NOTE — Telephone Encounter (Signed)
Per pt not feeling good cant make chemo class nor PET scan appt today. Scheduling has rescheduled her appointments.

## 2014-09-28 ENCOUNTER — Other Ambulatory Visit: Payer: Medicare Other

## 2014-09-28 ENCOUNTER — Ambulatory Visit: Payer: Medicare Other | Admitting: Hematology and Oncology

## 2014-09-28 ENCOUNTER — Ambulatory Visit: Payer: Medicare Other

## 2014-10-02 ENCOUNTER — Ambulatory Visit: Payer: Medicare Other

## 2014-10-03 ENCOUNTER — Encounter
Admission: RE | Admit: 2014-10-03 | Discharge: 2014-10-03 | Disposition: A | Discharge status: Home / Self Care | Payer: Medicare Other | Source: Ambulatory Visit | Attending: Hematology and Oncology | Admitting: Hematology and Oncology

## 2014-10-03 DIAGNOSIS — C787 Secondary malignant neoplasm of liver and intrahepatic bile duct: Secondary | ICD-10-CM | POA: Insufficient documentation

## 2014-10-03 DIAGNOSIS — C189 Malignant neoplasm of colon, unspecified: Secondary | ICD-10-CM | POA: Diagnosis not present

## 2014-10-03 LAB — GLUCOSE, CAPILLARY
Glucose-Capillary: 41 mg/dL — CL (ref 65–99)
Glucose-Capillary: 41 mg/dL — CL (ref 65–99)
Glucose-Capillary: 43 mg/dL — CL (ref 65–99)

## 2014-10-04 ENCOUNTER — Telehealth: Payer: Self-pay | Admitting: *Deleted

## 2014-10-04 ENCOUNTER — Ambulatory Visit
Admission: RE | Admit: 2014-10-04 | Discharge: 2014-10-04 | Disposition: A | Discharge status: Home / Self Care | Payer: Medicare Other | Source: Ambulatory Visit | Attending: Hematology and Oncology | Admitting: Hematology and Oncology

## 2014-10-04 DIAGNOSIS — I2510 Atherosclerotic heart disease of native coronary artery without angina pectoris: Secondary | ICD-10-CM | POA: Diagnosis not present

## 2014-10-04 DIAGNOSIS — C189 Malignant neoplasm of colon, unspecified: Secondary | ICD-10-CM | POA: Insufficient documentation

## 2014-10-04 DIAGNOSIS — C787 Secondary malignant neoplasm of liver and intrahepatic bile duct: Secondary | ICD-10-CM | POA: Insufficient documentation

## 2014-10-04 DIAGNOSIS — R188 Other ascites: Secondary | ICD-10-CM | POA: Insufficient documentation

## 2014-10-04 DIAGNOSIS — K8020 Calculus of gallbladder without cholecystitis without obstruction: Secondary | ICD-10-CM | POA: Insufficient documentation

## 2014-10-04 LAB — GLUCOSE, CAPILLARY: Glucose-Capillary: 66 mg/dL (ref 65–99)

## 2014-10-04 MED ORDER — FLUDEOXYGLUCOSE F - 18 (FDG) INJECTION
11.9400 | Freq: Once | INTRAVENOUS | Status: AC | PRN
  Administered 2014-10-04: 11.94 via INTRAVENOUS

## 2014-10-05 ENCOUNTER — Telehealth: Payer: Self-pay | Admitting: *Deleted

## 2014-10-05 DIAGNOSIS — Z95828 Presence of other vascular implants and grafts: Secondary | ICD-10-CM

## 2014-10-05 MED ORDER — LIDOCAINE-PRILOCAINE 2.5-2.5 % EX CREA: 1.0000 "application " | TOPICAL_CREAM | CUTANEOUS | Status: DC | PRN

## 2014-10-05 NOTE — Telephone Encounter (Signed)
escribed

## 2014-10-05 NOTE — Telephone Encounter (Signed)
Message sent to md

## 2014-10-08 ENCOUNTER — Other Ambulatory Visit: Payer: Self-pay

## 2014-10-08 MED ORDER — LIDOCAINE-PRILOCAINE 2.5-2.5 % EX CREA: 1.0000 "application " | TOPICAL_CREAM | CUTANEOUS | Status: DC | PRN

## 2014-10-09 ENCOUNTER — Telehealth: Payer: Self-pay | Admitting: *Deleted

## 2014-10-09 ENCOUNTER — Telehealth: Payer: Self-pay

## 2014-10-09 ENCOUNTER — Inpatient Hospital Stay: Payer: Medicare Other

## 2014-10-09 ENCOUNTER — Inpatient Hospital Stay (HOSPITAL_BASED_OUTPATIENT_CLINIC_OR_DEPARTMENT_OTHER): Payer: Medicare Other | Admitting: Hematology and Oncology

## 2014-10-09 ENCOUNTER — Other Ambulatory Visit: Payer: Self-pay

## 2014-10-09 VITALS — BP 106/70 | HR 66 | Temp 96.5°F | Ht 63.0 in | Wt 130.3 lb

## 2014-10-09 DIAGNOSIS — C787 Secondary malignant neoplasm of liver and intrahepatic bile duct: Secondary | ICD-10-CM | POA: Diagnosis not present

## 2014-10-09 DIAGNOSIS — R930 Abnormal findings on diagnostic imaging of skull and head, not elsewhere classified: Secondary | ICD-10-CM

## 2014-10-09 DIAGNOSIS — C189 Malignant neoplasm of colon, unspecified: Secondary | ICD-10-CM

## 2014-10-09 DIAGNOSIS — K5900 Constipation, unspecified: Secondary | ICD-10-CM

## 2014-10-09 DIAGNOSIS — R530 Neoplastic (malignant) related fatigue: Secondary | ICD-10-CM

## 2014-10-09 DIAGNOSIS — E119 Type 2 diabetes mellitus without complications: Secondary | ICD-10-CM

## 2014-10-09 DIAGNOSIS — R5383 Other fatigue: Secondary | ICD-10-CM

## 2014-10-09 DIAGNOSIS — F17210 Nicotine dependence, cigarettes, uncomplicated: Secondary | ICD-10-CM

## 2014-10-09 DIAGNOSIS — Z5111 Encounter for antineoplastic chemotherapy: Secondary | ICD-10-CM | POA: Diagnosis not present

## 2014-10-09 DIAGNOSIS — G893 Neoplasm related pain (acute) (chronic): Secondary | ICD-10-CM | POA: Diagnosis not present

## 2014-10-09 DIAGNOSIS — Z79899 Other long term (current) drug therapy: Secondary | ICD-10-CM

## 2014-10-09 DIAGNOSIS — I10 Essential (primary) hypertension: Secondary | ICD-10-CM

## 2014-10-09 DIAGNOSIS — E041 Nontoxic single thyroid nodule: Secondary | ICD-10-CM | POA: Diagnosis not present

## 2014-10-09 DIAGNOSIS — R634 Abnormal weight loss: Secondary | ICD-10-CM

## 2014-10-09 DIAGNOSIS — R160 Hepatomegaly, not elsewhere classified: Secondary | ICD-10-CM

## 2014-10-09 DIAGNOSIS — Z7982 Long term (current) use of aspirin: Secondary | ICD-10-CM

## 2014-10-09 DIAGNOSIS — M129 Arthropathy, unspecified: Secondary | ICD-10-CM

## 2014-10-09 DIAGNOSIS — R32 Unspecified urinary incontinence: Secondary | ICD-10-CM

## 2014-10-09 LAB — COMPREHENSIVE METABOLIC PANEL
ALT: 13 U/L — ABNORMAL LOW (ref 14–54)
AST: 33 U/L (ref 15–41)
Albumin: 2.1 g/dL — ABNORMAL LOW (ref 3.5–5.0)
Alkaline Phosphatase: 286 U/L — ABNORMAL HIGH (ref 38–126)
Anion gap: 8 (ref 5–15)
BUN: 17 mg/dL (ref 6–20)
CO2: 24 mmol/L (ref 22–32)
Calcium: 8.3 mg/dL — ABNORMAL LOW (ref 8.9–10.3)
Chloride: 99 mmol/L — ABNORMAL LOW (ref 101–111)
Creatinine, Ser: 0.73 mg/dL (ref 0.44–1.00)
GFR calc Af Amer: >60 mL/min (ref >60)
GFR calc non Af Amer: >60 mL/min (ref >60)
Glucose, Bld: 260 mg/dL — ABNORMAL HIGH (ref 65–99)
Potassium: 4.1 mmol/L (ref 3.5–5.1)
Sodium: 131 mmol/L — ABNORMAL LOW (ref 135–145)
Total Bilirubin: 0.4 mg/dL (ref 0.3–1.2)
Total Protein: 6.3 g/dL — ABNORMAL LOW (ref 6.5–8.1)

## 2014-10-09 LAB — CBC WITH DIFFERENTIAL/PLATELET
Basophils Absolute: 0 10*3/uL (ref 0–0.1)
Basophils Relative: 0 %
Eosinophils Absolute: 0 10*3/uL (ref 0–0.7)
Eosinophils Relative: 0 %
HCT: 27.4 % — ABNORMAL LOW (ref 35.0–47.0)
Hemoglobin: 8.7 g/dL — ABNORMAL LOW (ref 12.0–16.0)
Lymphocytes Relative: 5 %
Lymphs Abs: 0.7 10*3/uL — ABNORMAL LOW (ref 1.0–3.6)
MCH: 25.7 pg — ABNORMAL LOW (ref 26.0–34.0)
MCHC: 31.7 g/dL — ABNORMAL LOW (ref 32.0–36.0)
MCV: 81.3 fL (ref 80.0–100.0)
Monocytes Absolute: 0.9 10*3/uL (ref 0.2–0.9)
Monocytes Relative: 6 %
Neutro Abs: 13 10*3/uL — ABNORMAL HIGH (ref 1.4–6.5)
Neutrophils Relative %: 89 %
Platelets: 701 10*3/uL — ABNORMAL HIGH (ref 150–440)
RBC: 3.37 MIL/uL — ABNORMAL LOW (ref 3.80–5.20)
RDW: 17.2 % — ABNORMAL HIGH (ref 11.5–14.5)
WBC: 14.7 10*3/uL — ABNORMAL HIGH (ref 3.6–11.0)

## 2014-10-09 MED ORDER — OXALIPLATIN CHEMO INJECTION 100 MG/20ML
85.0000 mg/m2 | Freq: Once | INTRAVENOUS | Status: AC
  Administered 2014-10-09: 140 mg via INTRAVENOUS
  Filled 2014-10-09: qty 20

## 2014-10-09 MED ORDER — LEUCOVORIN CALCIUM INJECTION 350 MG
650.0000 mg | Freq: Once | INTRAMUSCULAR | Status: AC
  Administered 2014-10-09: 650 mg via INTRAVENOUS
  Filled 2014-10-09: qty 32.5

## 2014-10-09 MED ORDER — FLUOROURACIL CHEMO INJECTION 5 GM/100ML
2400.0000 mg/m2 | INTRAVENOUS | Status: DC
  Administered 2014-10-09: 3900 mg via INTRAVENOUS
  Filled 2014-10-09: qty 78

## 2014-10-09 MED ORDER — DEXTROSE 5 % IV SOLN
Freq: Once | INTRAVENOUS | Status: AC
  Administered 2014-10-09: 12:00:00 via INTRAVENOUS
  Filled 2014-10-09: qty 1000

## 2014-10-09 MED ORDER — ONDANSETRON HCL 4 MG PO TABS: 4.0000 mg | ORAL_TABLET | Freq: Three times a day (TID) | ORAL | Status: DC | PRN

## 2014-10-09 MED ORDER — FLUOROURACIL CHEMO INJECTION 2.5 GM/50ML
400.0000 mg/m2 | Freq: Once | INTRAVENOUS | Status: AC
Start: 2014-10-09 — End: 2014-10-09
  Administered 2014-10-09: 650 mg via INTRAVENOUS
  Filled 2014-10-09: qty 13

## 2014-10-09 MED ORDER — SODIUM CHLORIDE 0.9 % IV SOLN
Freq: Once | INTRAVENOUS | Status: AC
  Administered 2014-10-09: 12:00:00 via INTRAVENOUS
  Filled 2014-10-09: qty 4

## 2014-10-09 MED ORDER — SODIUM CHLORIDE 0.9 % IJ SOLN
10.0000 mL | INTRAMUSCULAR | Status: DC | PRN
  Administered 2014-10-09: 10 mL via INTRAVENOUS
  Filled 2014-10-09: qty 10

## 2014-10-09 NOTE — Progress Notes (Signed)
[facility] day:  10/09/2014  Chief Complaint: Sandy [name] is a 69 y.o. female with stage IV colon cancer who is seen for assessment prior to cycle #1 FOLFOX chemotherapy.  HPI: The patient was last seen in the medical oncology clinic on 09/18/2014.  At that time,  She was seen for new patient assessment.  She had metastatic colon cancer presenting with a 53 pound weight loss and a fungating mass in the descending colon.  CT angiogram had noted multiple large enhancing liver masses (largest 6.4 cm).  CEA was 11,793.  At last visit, we discussed treatment of her cancer with FOLFOX chemotherapy +/- Avastin.  A port-a-cath was placed on 09/24/2014.  She attended chemotherapy class.   PET scan on 10/04/2014 revealed 6.5 x 6.1 cm hypermetabolic mass in the proximal sigmoid colon,  There were  numerous large hypermetabolic hepatic metastases. There were no additional sites of metastatic disease in the neck, chest or skeleton.  In addition, there was a 3.2 x 2.8 cm low-attenuation right thyroid nodule. Further evaluation with thyroid ultrasound was recommended.  She states that sometimes she has on/off pain around her waist. She tried oxycodone which caused constipation and a cancellation of an appointment then diarrhea afre X-lax and stool softeners. She is now trying half of an oxycodone. She has not taking any pain medications in a week. She is using extra strength Tylenol.  She states that she is eating and gaining weight. He denies any blood in her stool except if she strains. Stool is formed. She remains tired. She goes to from the bed to the couch. She participates in her activities of daily living. She uses a Depends or Always pad.  She states that she wishes to pursue treatment and is not interested in Hospice.    Past Medical History  Diagnosis Date  . Hypertension   . Diabetes mellitus without complication   . Arthritis     Past Surgical  History  Procedure Laterality Date  . Colonoscopy with propofol N/A 09/07/2014    Procedure: COLONOSCOPY WITH PROPOFOL;  Surgeon: Lucilla Lame, MD;  Location: [facility] ENDOSCOPY;  Service: Endoscopy;  Laterality: N/A;  . Esophagogastroduodenoscopy N/A 09/07/2014    Procedure: ESOPHAGOGASTRODUODENOSCOPY (EGD);  Surgeon: Lucilla Lame, MD;  Location: [facility] ENDOSCOPY;  Service: Endoscopy;  Laterality: N/A;  . Portacath placement Left 09/24/2014    Procedure: INSERTION PORT-A-CATH;  Surgeon: Robert Bellow, MD;  Location: [facility] ORS;  Service: General;  Laterality: Left;    Family History  Problem Relation Age of Onset  . Cancer Sister     Social History:  reports that she quit smoking about 11 years ago. Her smoking use included Cigarettes. She has a 5 pack-year smoking history. She has never used smokeless tobacco. She reports that she does not drink alcohol or use illicit drugs.  The patient is accompanied by Sandy [name].  Allergies: No Known Allergies  Current Medications: Current Outpatient Prescriptions  Medication Sig Dispense Refill  . amLODipine (NORVASC) 5 MG tablet Take 5 mg by mouth daily.    Marland Kitchen aspirin EC 81 MG tablet Take 81 mg by mouth daily.    Marland Kitchen atorvastatin (LIPITOR) 40 MG tablet Take 40 mg by mouth daily.    Marland Kitchen lidocaine-prilocaine (EMLA) cream Apply 1 application topically as needed. 30 g 1  . lidocaine-prilocaine (EMLA) cream Apply 1 application topically as needed. Apply 1-2 hours prior to chemotherapy 30 g 1  . lisinopril-hydrochlorothiazide (PRINZIDE,ZESTORETIC)  20-25 MG per tablet Take 1 tablet by mouth daily.    . metFORMIN (GLUCOPHAGE) 1000 MG tablet Take 1,000 mg by mouth daily.     . Multiple Vitamin (MULTIVITAMIN WITH MINERALS) TABS tablet Take 1 tablet by mouth daily.    Marland Kitchen oxyCODONE-acetaminophen (PERCOCET/ROXICET) 5-325 MG per tablet Take 1 tablet by mouth every 6 (six) hours as needed for severe pain. 20 tablet 0  . pioglitazone (ACTOS) 30 MG tablet Take 30 mg  by mouth daily.    Marland Kitchen zolpidem (AMBIEN) 10 MG tablet Take 1 tablet by mouth at bedtime as needed.    Marland Kitchen glipiZIDE (GLUCOTROL) 10 MG tablet Take 10 mg by mouth 2 (two) times daily before a meal.    . metoprolol (TOPROL-XL) 200 MG 24 hr tablet Take 200 mg by mouth daily.     No current facility-administered medications for this visit.   Facility-Administered Medications Ordered in Other Visits  Medication Dose Route Frequency Provider Last Rate Last Dose  . sodium chloride 0.9 % injection 10 mL  10 mL Intravenous PRN Lequita Asal, MD   10 mL at 10/09/14 5809    Review of Systems:  GENERAL: Fatigue. Stays in bed or on couch much of the day. Weight down 53# in 6 months. No fevers or sweats. PERFORMANCE STATUS (ECOG): 3. HEENT: Wears a hearing aide. No visual changes, runny nose, sore throat, mouth sores or tenderness. Lungs: No shortness of breath or cough. No hemoptysis. Cardiac: No chest pain, palpitations, orthopnea, or PND. GI: Upper abdominal pain on/off. No nausea, vomiting, diarrhea, constipation, melena or hematochezia. GU: Urgency and frequency. Wears a Depends pad.  No dysuria or hematuria. Musculoskeletal: No back pain. No joint pain. No muscle tenderness. Extremities: No pain or swelling. Skin: Upper abdominal rash (old). Neuro: No headache, numbness or weakness, or balance issues. Endocrine: No diabetes, thyroid issues, hot flashes or night sweats. Psych: No mood changes, depression or anxiety. Pain: Upper abdominal pain. Review of systems: All other systems reviewed and found to be negative.  Physical Exam: Blood pressure 106/70, pulse 66, temperature 96.5 F (35.8 C), temperature source Tympanic, height 5\' 3"  (1.6 m), weight 130 lb 4.7 oz (59.101 kg). GENERAL: Chronically ill appearing woman sitting comfortably in the exam room in no acute distress. MENTAL STATUS: Alert and oriented to person, place and time. HEAD: Brown hair. Normocephalic,  atraumatic, face symmetric, no Cushingoid features. EYES: Glasses. Brown eyes. Pupils equal round and reactive to light and accomodation. No conjunctivitis or scleral icterus. ENT: Oropharynx clear without lesion. Partials. Tongue normal. Mucous membranes moist.  CHEST:  Port-a-cath. RESPIRATORY: Clear to auscultation without rales, wheezes or rhonchi. CARDIOVASCULAR: Regular rate and rhythm without murmur, rub or gallop. ABDOMEN: Soft, tender right upper quadrant with liver palpable to 2 fingerbreaths above umbilicus. No guarding or rebound tenderness. Left lower quadrant full without discrete mass. SKIN: Dry elongated oval rash beneath right breast (stable).  EXTREMITIES: No edema, no skin discoloration or tenderness. No palpable cords. LYMPH NODES: No palpable cervical, supraclavicular, axillary or inguinal adenopathy  NEUROLOGICAL: Unremarkable. PSYCH: Appropriate.  Infusion on 10/09/2014  Component Date Value Ref Range Status  . WBC 10/09/2014 14.7* 3.6 - 11.0 K/uL Final  . RBC 10/09/2014 3.37* 3.80 - 5.20 MIL/uL Final  . Hemoglobin 10/09/2014 8.7* 12.0 - 16.0 g/dL Final  . HCT 10/09/2014 27.4* 35.0 - 47.0 % Final  . MCV 10/09/2014 81.3  80.0 - 100.0 fL Final  . MCH 10/09/2014 25.7* 26.0 - 34.0 pg Final  .  MCHC 10/09/2014 31.7* 32.0 - 36.0 g/dL Final  . RDW 10/09/2014 17.2* 11.5 - 14.5 % Final  . Platelets 10/09/2014 701* 150 - 440 K/uL Final  . Neutrophils Relative % 10/09/2014 89   Final  . Neutro Abs 10/09/2014 13.0* 1.4 - 6.5 K/uL Final  . Lymphocytes Relative 10/09/2014 5   Final  . Lymphs Abs 10/09/2014 0.7* 1.0 - 3.6 K/uL Final  . Monocytes Relative 10/09/2014 6   Final  . Monocytes Absolute 10/09/2014 0.9  0.2 - 0.9 K/uL Final  . Eosinophils Relative 10/09/2014 0   Final  . Eosinophils Absolute 10/09/2014 0.0  0 - 0.7 K/uL Final  . Basophils Relative 10/09/2014 0   Final  . Basophils Absolute 10/09/2014 0.0  0 - 0.1 K/uL Final    Assessment:  ONITA PFLUGER is a 69 y.o. female with metastatic colon cancer. She presented with a 53 pound weight loss in 6 months. Colonoscopy on 09/07/2014 revealed a circumferential partially obstructive fungating mass in the descending colon. There was no active bleeding. There was a 1.5 cm pedunculated polyp in the sigmoid colon (tubulovillous adenoma). Pathology from the descending colon mass revealed fragments of adenocarcinoma. CEA was 11,793 on 09/05/2014.  Chest CT angiogram on 08/28/2014 revealed no evidence of pulmonary embolism, but multiple large enhancing liver masses (largest 6.4 cm).  PET scan on 10/04/2014 revealed 6.5 x 6.1 cm hypermetabolic mass in the proximal sigmoid colon,  There were  numerous large hypermetabolic hepatic metastases. There were no additional sites of metastatic disease in the neck, chest or skeleton.  In addition, there was a 3.2 x 2.8 cm low-attenuation right thyroid nodule.   Symptomatically, she remains fatigued.  She has gained weight. She has intermittent upper abdominal pain associated with her liver metastasis and controlled with 1/2 oxycodone or extra strength Tylenol.  Plan: 1.  Discuss PET scan and chemotherapy plan.  Begin with FOLFOX every 2 weeks.  After 2 cycles, consider addition of Avastin.  Side effects re-reviewed.  Patient consented to treatment. 2.  Discuss pain management and constipation due to narcotics. 3.  Labs today:  CBC with diff, CMP, CEA. 4.  Cycle #1 FOLFOX chemotherapy today. 5.  RTC in 2 days for disconnect, 6.  Discuss thyroid nodule.  Consider biopsy in future. 7.  RTC in 1 week for MD assess and labs (CBC with diff, BMP). 8.  RTC in 2 weeks for MD assess, labs (CBC with diff, CMP, CEA) and cycle #2 FOLFOX chemotherapy.   Lequita Asal, MD  10/09/2014, 10:29 AM

## 2014-10-09 NOTE — Telephone Encounter (Signed)
Pt and daughter state they attended chemo class on 6/21

## 2014-10-09 NOTE — Progress Notes (Signed)
Pt here for follow up regarding colon cancer and initial treatment of folfox; offers no complaints today

## 2014-10-09 NOTE — Telephone Encounter (Signed)
Escribed

## 2014-10-10 LAB — CEA: CEA: 10169 ng/mL — ABNORMAL HIGH (ref 0.0–4.7)

## 2014-10-11 ENCOUNTER — Telehealth: Payer: Self-pay | Admitting: *Deleted

## 2014-10-11 ENCOUNTER — Inpatient Hospital Stay: Payer: Medicare Other

## 2014-10-11 DIAGNOSIS — C801 Malignant (primary) neoplasm, unspecified: Secondary | ICD-10-CM

## 2014-10-11 DIAGNOSIS — Z5111 Encounter for antineoplastic chemotherapy: Secondary | ICD-10-CM | POA: Diagnosis not present

## 2014-10-11 MED ORDER — HEPARIN SOD (PORK) LOCK FLUSH 100 UNIT/ML IV SOLN
500.0000 [IU] | Freq: Once | INTRAVENOUS | Status: AC
  Administered 2014-10-11: 500 [IU] via INTRAVENOUS

## 2014-10-11 MED ORDER — SODIUM CHLORIDE 0.9 % IJ SOLN
10.0000 mL | INTRAMUSCULAR | Status: AC | PRN
  Administered 2014-10-11: 10 mL
  Filled 2014-10-11: qty 10

## 2014-10-11 NOTE — Telephone Encounter (Signed)
Spoke with Sandy [name] and advised if they want, HH can go out to evaluate for needs, but does not feel at presnt time it is necessary

## 2014-10-15 ENCOUNTER — Encounter: Payer: Self-pay | Admitting: Hematology and Oncology

## 2014-10-16 ENCOUNTER — Inpatient Hospital Stay: Payer: Medicare Other | Attending: Hematology and Oncology

## 2014-10-16 ENCOUNTER — Telehealth: Payer: Self-pay

## 2014-10-16 ENCOUNTER — Inpatient Hospital Stay (HOSPITAL_BASED_OUTPATIENT_CLINIC_OR_DEPARTMENT_OTHER): Payer: Medicare Other | Admitting: Hematology and Oncology

## 2014-10-16 VITALS — BP 111/71 | HR 68 | Temp 97.7°F | Resp 17 | Ht 65.0 in | Wt 125.9 lb

## 2014-10-16 DIAGNOSIS — Z5111 Encounter for antineoplastic chemotherapy: Secondary | ICD-10-CM | POA: Insufficient documentation

## 2014-10-16 DIAGNOSIS — E871 Hypo-osmolality and hyponatremia: Secondary | ICD-10-CM | POA: Insufficient documentation

## 2014-10-16 DIAGNOSIS — R110 Nausea: Secondary | ICD-10-CM | POA: Insufficient documentation

## 2014-10-16 DIAGNOSIS — E119 Type 2 diabetes mellitus without complications: Secondary | ICD-10-CM | POA: Insufficient documentation

## 2014-10-16 DIAGNOSIS — M129 Arthropathy, unspecified: Secondary | ICD-10-CM | POA: Diagnosis not present

## 2014-10-16 DIAGNOSIS — I10 Essential (primary) hypertension: Secondary | ICD-10-CM | POA: Diagnosis not present

## 2014-10-16 DIAGNOSIS — C189 Malignant neoplasm of colon, unspecified: Secondary | ICD-10-CM | POA: Diagnosis not present

## 2014-10-16 DIAGNOSIS — E781 Pure hyperglyceridemia: Secondary | ICD-10-CM

## 2014-10-16 DIAGNOSIS — Z7982 Long term (current) use of aspirin: Secondary | ICD-10-CM | POA: Diagnosis not present

## 2014-10-16 DIAGNOSIS — R634 Abnormal weight loss: Secondary | ICD-10-CM

## 2014-10-16 DIAGNOSIS — R5383 Other fatigue: Secondary | ICD-10-CM | POA: Diagnosis not present

## 2014-10-16 DIAGNOSIS — Z79899 Other long term (current) drug therapy: Secondary | ICD-10-CM | POA: Insufficient documentation

## 2014-10-16 DIAGNOSIS — E876 Hypokalemia: Secondary | ICD-10-CM | POA: Diagnosis not present

## 2014-10-16 DIAGNOSIS — C787 Secondary malignant neoplasm of liver and intrahepatic bile duct: Secondary | ICD-10-CM

## 2014-10-16 DIAGNOSIS — Z87891 Personal history of nicotine dependence: Secondary | ICD-10-CM

## 2014-10-16 DIAGNOSIS — D649 Anemia, unspecified: Secondary | ICD-10-CM

## 2014-10-16 LAB — CBC WITH DIFFERENTIAL/PLATELET
Basophils Absolute: 0 10*3/uL (ref 0–0.1)
Basophils Relative: 0 %
Eosinophils Absolute: 0 10*3/uL (ref 0–0.7)
Eosinophils Relative: 0 %
HCT: 24.6 % — ABNORMAL LOW (ref 35.0–47.0)
Hemoglobin: 7.8 g/dL — ABNORMAL LOW (ref 12.0–16.0)
Lymphocytes Relative: 7 %
Lymphs Abs: 1.1 10*3/uL (ref 1.0–3.6)
MCH: 25.8 pg — ABNORMAL LOW (ref 26.0–34.0)
MCHC: 31.9 g/dL — ABNORMAL LOW (ref 32.0–36.0)
MCV: 80.7 fL (ref 80.0–100.0)
Monocytes Absolute: 1.1 10*3/uL — ABNORMAL HIGH (ref 0.2–0.9)
Monocytes Relative: 7 %
Neutro Abs: 12.9 10*3/uL — ABNORMAL HIGH (ref 1.4–6.5)
Neutrophils Relative %: 86 %
Platelets: 570 10*3/uL — ABNORMAL HIGH (ref 150–440)
RBC: 3.05 MIL/uL — ABNORMAL LOW (ref 3.80–5.20)
RDW: 17.7 % — ABNORMAL HIGH (ref 11.5–14.5)
WBC: 15.2 10*3/uL — ABNORMAL HIGH (ref 3.6–11.0)

## 2014-10-16 LAB — BASIC METABOLIC PANEL
Anion gap: 9 (ref 5–15)
BUN: 14 mg/dL (ref 6–20)
CO2: 24 mmol/L (ref 22–32)
Calcium: 7.9 mg/dL — ABNORMAL LOW (ref 8.9–10.3)
Chloride: 94 mmol/L — ABNORMAL LOW (ref 101–111)
Creatinine, Ser: 0.78 mg/dL (ref 0.44–1.00)
GFR calc Af Amer: >60 mL/min (ref >60)
GFR calc non Af Amer: >60 mL/min (ref >60)
Glucose, Bld: 185 mg/dL — ABNORMAL HIGH (ref 65–99)
Potassium: 3.7 mmol/L (ref 3.5–5.1)
Sodium: 127 mmol/L — ABNORMAL LOW (ref 135–145)

## 2014-10-16 NOTE — Telephone Encounter (Signed)
Called pt per Dr. Mike Gip to see how nausea was and pts daughter, [name] stated the pt was better and has appointment for follow up at 4:00 today

## 2014-10-16 NOTE — Progress Notes (Signed)
[facility] day:  10/16/2014  Chief Complaint: Sandy [name] is a 69 y.o. female with stage IV colon cancer who is seen for assessment on day 8 status post cycle #1 FOLFOX chemotherapy.  HPI: The patient was last seen in the medical oncology clinic on 10/09/2014.  At that time,  she was seen for assessment prior to cycle #1 FOLFOX chemotherapy.  She was fatigued, but eating and gaining weight.  She noted intermittent upper abdominal pain associated with her liver metastasis and controlled with 1/2 oxycodone or extra strength Tylenol.  She received her chemotherapy uneventfully.  During the interim, she has not been very active. Her daughter notes that she goes from the bedroom to the couch.  She denies any diarrhea or neuropathy. She did have some nausea. One morning, she got up at 4 or 5 AM after taking a sleeping pill. She couldn't hold her urine. She has been drinking water and Enure. She says she doesn't like anything to eat.  Her daughter offers her pancakes, peanut butter,jello, pudding, and anything she would like to eat.  Recently one of her blood pressure medications and diabetic medications were discontinued.  Past Medical History  Diagnosis Date  . Hypertension   . Diabetes mellitus without complication   . Arthritis     Past Surgical History  Procedure Laterality Date  . Colonoscopy with propofol N/A 09/07/2014    Procedure: COLONOSCOPY WITH PROPOFOL;  Surgeon: Lucilla Lame, MD;  Location: [facility] ENDOSCOPY;  Service: Endoscopy;  Laterality: N/A;  . Esophagogastroduodenoscopy N/A 09/07/2014    Procedure: ESOPHAGOGASTRODUODENOSCOPY (EGD);  Surgeon: Lucilla Lame, MD;  Location: [facility] ENDOSCOPY;  Service: Endoscopy;  Laterality: N/A;  . Portacath placement Left 09/24/2014    Procedure: INSERTION PORT-A-CATH;  Surgeon: Robert Bellow, MD;  Location: [facility] ORS;  Service: General;  Laterality: Left;    Family History  Problem Relation Age  of Onset  . Cancer Sister     Social History:  reports that she quit smoking about 11 years ago. Her smoking use included Cigarettes. She has a 5 pack-year smoking history. She has never used smokeless tobacco. She reports that she does not drink alcohol or use illicit drugs.  The patient is accompanied by her daughter who is often tearful trying to get her mother to eat or participate in activities.  Allergies: No Known Allergies  Current Medications: Current Outpatient Prescriptions  Medication Sig Dispense Refill  . amLODipine (NORVASC) 5 MG tablet Take 5 mg by mouth daily.    Marland Kitchen aspirin EC 81 MG tablet Take 81 mg by mouth daily.    Marland Kitchen atorvastatin (LIPITOR) 40 MG tablet Take 40 mg by mouth daily.    Marland Kitchen lidocaine-prilocaine (EMLA) cream Apply 1 application topically as needed. 30 g 1  . lidocaine-prilocaine (EMLA) cream Apply 1 application topically as needed. Apply 1-2 hours prior to chemotherapy 30 g 1  . lisinopril-hydrochlorothiazide (PRINZIDE,ZESTORETIC) 20-25 MG per tablet Take 1 tablet by mouth daily.    . metFORMIN (GLUCOPHAGE) 1000 MG tablet Take 1,000 mg by mouth daily.     . metoprolol (TOPROL-XL) 200 MG 24 hr tablet Take 200 mg by mouth daily.    . Multiple Vitamin (MULTIVITAMIN WITH MINERALS) TABS tablet Take 1 tablet by mouth daily.    . ondansetron (ZOFRAN) 4 MG tablet Take 1 tablet (4 mg total) by mouth every 8 (eight) hours as needed for nausea or vomiting. 30 tablet 2  . pioglitazone (ACTOS) 30  MG tablet Take 30 mg by mouth daily.    Marland Kitchen zolpidem (AMBIEN) 10 MG tablet Take 1 tablet by mouth at bedtime as needed.    Marland Kitchen glipiZIDE (GLUCOTROL) 10 MG tablet Take 10 mg by mouth 2 (two) times daily before a meal.    . oxyCODONE-acetaminophen (PERCOCET/ROXICET) 5-325 MG per tablet Take 1 tablet by mouth every 6 (six) hours as needed for severe pain. (Patient not taking: Reported on 10/16/2014) 20 tablet 0   No current facility-administered medications for this visit.    Facility-Administered Medications Ordered in Other Visits  Medication Dose Route Frequency Provider Last Rate Last Dose  . sodium chloride 0.9 % injection 10 mL  10 mL Intracatheter PRN Lequita Asal, MD   10 mL at 10/11/14 1431    Review of Systems:  GENERAL: general fatigue. Stays in bed or on couch much of the day. Weight down 53# in 6 months. No fevers or sweats. PERFORMANCE STATUS (ECOG): 3. HEENT: Wears a hearing aide. No visual changes, runny nose, sore throat, mouth sores or tenderness. Lungs: No shortness of breath or cough. No hemoptysis. Cardiac: No chest pain, palpitations, orthopnea, or PND. GI: Upper abdominal pain on/off. Transient nausea associated with chemotherapy.  Novomiting, diarrhea, constipation, melena or hematochezia. GU: Urgency and frequency. Wears a Depends pad.  No dysuria or hematuria. Musculoskeletal: No back pain. No joint pain. No muscle tenderness. Extremities: No pain or swelling. Skin: Upper abdominal rash (old). Neuro: No oxaliplatin induced neuropathy.  No headache, numbness or weakness, or balance issues. Endocrine: No diabetes, thyroid issues, hot flashes or night sweats. Psych: No mood changes, depression or anxiety. Pain: Upper abdominal pain. Review of systems: All other systems reviewed and found to be negative.  Physical Exam: Blood pressure 111/71, pulse 68, temperature 97.7 F (36.5 C), temperature source Oral, resp. rate 17, height 5' 5"  (1.651 m), weight 125 lb 14.1 oz (57.1 kg). GENERAL: Chronically ill appearing woman sitting comfortably in a wheelchair in the exam room in no acute distress. MENTAL STATUS: Alert and oriented to person, place and time. HEAD: Brown curly hair with red highlights. Normocephalic, atraumatic, face symmetric, no Cushingoid features. EYES: Glasses. Brown eyes. Pupils equal round and reactive to light and accomodation. No conjunctivitis or scleral icterus. ENT: Oropharynx  clear without lesion. Partials. Tongue normal. Mucous membranes moist.  CHEST:  Port-a-cath. RESPIRATORY: Clear to auscultation without rales, wheezes or rhonchi. CARDIOVASCULAR: Regular rate and rhythm without murmur, rub or gallop. ABDOMEN: Soft, tender right upper quadrant with liver palpable to 2 fingerbreaths above umbilicus. No guarding or rebound tenderness. Left lower quadrant full without discrete mass. SKIN: Dry elongated oval rash beneath right breast (stable).  EXTREMITIES: No edema, no skin discoloration or tenderness. No palpable cords. LYMPH NODES: No palpable cervical, supraclavicular, axillary or inguinal adenopathy  NEUROLOGICAL: Unremarkable. PSYCH: Appropriate.  Appointment on 10/16/2014  Component Date Value Ref Range Status  . WBC 10/16/2014 15.2* 3.6 - 11.0 K/uL Final  . RBC 10/16/2014 3.05* 3.80 - 5.20 MIL/uL Final  . Hemoglobin 10/16/2014 7.8* 12.0 - 16.0 g/dL Final  . HCT 10/16/2014 24.6* 35.0 - 47.0 % Final  . MCV 10/16/2014 80.7  80.0 - 100.0 fL Final  . MCH 10/16/2014 25.8* 26.0 - 34.0 pg Final  . MCHC 10/16/2014 31.9* 32.0 - 36.0 g/dL Final  . RDW 10/16/2014 17.7* 11.5 - 14.5 % Final  . Platelets 10/16/2014 570* 150 - 440 K/uL Final  . Neutrophils Relative % 10/16/2014 86   Final  . Neutro  Abs 10/16/2014 12.9* 1.4 - 6.5 K/uL Final  . Lymphocytes Relative 10/16/2014 7   Final  . Lymphs Abs 10/16/2014 1.1  1.0 - 3.6 K/uL Final  . Monocytes Relative 10/16/2014 7   Final  . Monocytes Absolute 10/16/2014 1.1* 0.2 - 0.9 K/uL Final  . Eosinophils Relative 10/16/2014 0   Final  . Eosinophils Absolute 10/16/2014 0.0  0 - 0.7 K/uL Final  . Basophils Relative 10/16/2014 0   Final  . Basophils Absolute 10/16/2014 0.0  0 - 0.1 K/uL Final  . Sodium 10/16/2014 127* 135 - 145 mmol/L Final  . Potassium 10/16/2014 3.7  3.5 - 5.1 mmol/L Final  . Chloride 10/16/2014 94* 101 - 111 mmol/L Final  . CO2 10/16/2014 24  22 - 32 mmol/L Final  . Glucose, Bld 10/16/2014  185* 65 - 99 mg/dL Final  . BUN 10/16/2014 14  6 - 20 mg/dL Final  . Creatinine, Ser 10/16/2014 0.78  0.44 - 1.00 mg/dL Final  . Calcium 10/16/2014 7.9* 8.9 - 10.3 mg/dL Final  . GFR calc non Af Amer 10/16/2014 >60  >60 mL/min Final  . GFR calc Af Amer 10/16/2014 >60  >60 mL/min Final   Comment: (NOTE) The eGFR has been calculated using the CKD EPI equation. This calculation has not been validated in all clinical situations. eGFR's persistently <60 mL/min signify possible Chronic Kidney Disease.   Georgiann Hahn gap 10/16/2014 9  5 - 15 Final    Assessment:  Sandy [name] is a 69 y.o. female with metastatic colon cancer. She presented with a 53 pound weight loss in 6 months. Colonoscopy on 09/07/2014 revealed a circumferential partially obstructive fungating mass in the descending colon. There was no active bleeding. There was a 1.5 cm pedunculated polyp in the sigmoid colon (tubulovillous adenoma). Pathology from the descending colon mass revealed fragments of adenocarcinoma. CEA was 11,793 on 09/05/2014.  Chest CT angiogram on 08/28/2014 revealed no evidence of pulmonary embolism, but multiple large enhancing liver masses (largest 6.4 cm).  PET scan on 10/04/2014 revealed 6.5 x 6.1 cm hypermetabolic mass in the proximal sigmoid colon,  There were  numerous large hypermetabolic hepatic metastases. There were no additional sites of metastatic disease in the neck, chest or skeleton.  In addition, there was a 3.2 x 2.8 cm low-attenuation right thyroid nodule.   She is currently day 8 of cycle #1 FOLFOX chemotherapy (10/09/2014).  She tolerated her chemotherapy well with minimal nausea and no diarrhea or neuropathy.  Symptomatically, she remains fatigued.  She is eating poorly.  Her hematocrit is drifting down.  She has hyponatremia from drinking plain water.  Plan: 1.  Labs today:  CBC with diff, BMP. 2.  Discuss fluid intake.  Limit free water intake.  Patient to drink fluids with  electrolytes. 3.  Discuss IVF daily x 3 days.  1 liter NS daily. 4.  RTC tomorrow for labs (BMP and type and cross). 5.  RTC on 10/19/2014 for IVF +/- transfusion of PRBCs. 6.  RTC on 10/23/2014 for MD assess, labs (CBC with diff, CMP, CEA) and cycle #2 FOLFOX chemotherapy.   Lequita Asal, MD  10/16/2014, 3:42 PM

## 2014-10-17 ENCOUNTER — Other Ambulatory Visit: Payer: Self-pay

## 2014-10-17 ENCOUNTER — Ambulatory Visit: Payer: Medicare Other

## 2014-10-17 ENCOUNTER — Emergency Department
Admission: EM | Admit: 2014-10-17 | Discharge: 2014-10-17 | Disposition: A | Discharge status: Home / Self Care | Payer: Medicare Other | Attending: Emergency Medicine | Admitting: Emergency Medicine

## 2014-10-17 ENCOUNTER — Telehealth: Payer: Self-pay | Admitting: *Deleted

## 2014-10-17 ENCOUNTER — Encounter: Payer: Self-pay | Admitting: Emergency Medicine

## 2014-10-17 ENCOUNTER — Other Ambulatory Visit: Payer: Medicare Other

## 2014-10-17 ENCOUNTER — Emergency Department: Payer: Medicare Other

## 2014-10-17 DIAGNOSIS — Z87891 Personal history of nicotine dependence: Secondary | ICD-10-CM | POA: Insufficient documentation

## 2014-10-17 DIAGNOSIS — Z79899 Other long term (current) drug therapy: Secondary | ICD-10-CM | POA: Insufficient documentation

## 2014-10-17 DIAGNOSIS — R531 Weakness: Secondary | ICD-10-CM

## 2014-10-17 DIAGNOSIS — E860 Dehydration: Secondary | ICD-10-CM | POA: Diagnosis not present

## 2014-10-17 DIAGNOSIS — M6281 Muscle weakness (generalized): Secondary | ICD-10-CM | POA: Diagnosis not present

## 2014-10-17 DIAGNOSIS — I10 Essential (primary) hypertension: Secondary | ICD-10-CM | POA: Insufficient documentation

## 2014-10-17 DIAGNOSIS — Z7982 Long term (current) use of aspirin: Secondary | ICD-10-CM | POA: Insufficient documentation

## 2014-10-17 DIAGNOSIS — E119 Type 2 diabetes mellitus without complications: Secondary | ICD-10-CM | POA: Insufficient documentation

## 2014-10-17 HISTORY — DX: Malignant (primary) neoplasm, unspecified: C80.1

## 2014-10-17 LAB — COMPREHENSIVE METABOLIC PANEL
ALT: 11 U/L — AB (ref 14–54)
AST: 44 U/L — AB (ref 15–41)
Albumin: 2.1 g/dL — ABNORMAL LOW (ref 3.5–5.0)
Alkaline Phosphatase: 216 U/L — ABNORMAL HIGH (ref 38–126)
Anion gap: 15 (ref 5–15)
BILIRUBIN TOTAL: 0.6 mg/dL (ref 0.3–1.2)
BUN: 14 mg/dL (ref 6–20)
CO2: 24 mmol/L (ref 22–32)
CREATININE: 0.81 mg/dL (ref 0.44–1.00)
Calcium: 9 mg/dL (ref 8.9–10.3)
Chloride: 95 mmol/L — ABNORMAL LOW (ref 101–111)
GFR calc Af Amer: >60 mL/min (ref >60)
Glucose, Bld: 203 mg/dL — ABNORMAL HIGH (ref 65–99)
Potassium: 3.6 mmol/L (ref 3.5–5.1)
SODIUM: 134 mmol/L — AB (ref 135–145)
Total Protein: 6.1 g/dL — ABNORMAL LOW (ref 6.5–8.1)

## 2014-10-17 LAB — CBC WITH DIFFERENTIAL/PLATELET
BASOS ABS: 0 10*3/uL (ref 0–0.1)
Basophils Relative: 0 %
EOS PCT: 0 %
Eosinophils Absolute: 0 10*3/uL (ref 0–0.7)
HEMATOCRIT: 24.2 % — AB (ref 35.0–47.0)
Hemoglobin: 7.7 g/dL — ABNORMAL LOW (ref 12.0–16.0)
LYMPHS PCT: 4 %
Lymphs Abs: 0.6 10*3/uL — ABNORMAL LOW (ref 1.0–3.6)
MCH: 25.6 pg — ABNORMAL LOW (ref 26.0–34.0)
MCHC: 31.6 g/dL — ABNORMAL LOW (ref 32.0–36.0)
MCV: 81.1 fL (ref 80.0–100.0)
MONOS PCT: 9 %
Monocytes Absolute: 1.6 10*3/uL — ABNORMAL HIGH (ref 0.2–0.9)
Neutro Abs: 15 10*3/uL — ABNORMAL HIGH (ref 1.4–6.5)
Neutrophils Relative %: 87 %
PLATELETS: 515 10*3/uL — AB (ref 150–440)
RBC: 2.99 MIL/uL — ABNORMAL LOW (ref 3.80–5.20)
RDW: 18.3 % — AB (ref 11.5–14.5)
WBC: 17.2 10*3/uL — AB (ref 3.6–11.0)

## 2014-10-17 LAB — URINALYSIS COMPLETE WITH MICROSCOPIC (ARMC ONLY)
Bilirubin Urine: NEGATIVE
GLUCOSE, UA: NEGATIVE mg/dL
Hgb urine dipstick: NEGATIVE
KETONES UR: NEGATIVE mg/dL
Nitrite: NEGATIVE
PROTEIN: 30 mg/dL — AB
Specific Gravity, Urine: 1.02 (ref 1.005–1.030)
pH: 5 (ref 5.0–8.0)

## 2014-10-17 LAB — CEA: CEA: 11664 ng/mL — ABNORMAL HIGH (ref 0.0–4.7)

## 2014-10-17 LAB — TROPONIN I: TROPONIN I: 0.04 ng/mL — AB (ref <0.031)

## 2014-10-17 LAB — LIPASE, BLOOD: Lipase: 26 U/L (ref 22–51)

## 2014-10-17 MED ORDER — OXYCODONE-ACETAMINOPHEN 5-325 MG PO TABS
1.0000 | ORAL_TABLET | Freq: Once | ORAL | Status: AC
  Administered 2014-10-17: 1 via ORAL

## 2014-10-17 MED ORDER — SODIUM CHLORIDE 0.9 % IV BOLUS (SEPSIS)
1000.0000 mL | Freq: Once | INTRAVENOUS | Status: AC
  Administered 2014-10-17: 1000 mL via INTRAVENOUS

## 2014-10-17 MED ORDER — OXYCODONE-ACETAMINOPHEN 5-325 MG PO TABS
ORAL_TABLET | ORAL | Status: AC
  Administered 2014-10-17: 1 via ORAL
  Filled 2014-10-17: qty 1

## 2014-10-17 NOTE — Care Management (Signed)
Herschell Dimes social worker from [employer] present assessing patient.  Mariann Laster left contact information in case she needed to be contacted. [phone]

## 2014-10-17 NOTE — Telephone Encounter (Signed)
Patient crying with pain and wants to be admitted as offered yesterday. Advised to take patient to ER. She said she will take her right now.

## 2014-10-17 NOTE — ED Notes (Signed)
Patient transported to CT 

## 2014-10-17 NOTE — ED Notes (Signed)
Pt comes POV with family due to having dehydration.  Patient is currently being treated for liver cancer.  Patient is pale and has been having mild nausea s/p chemo treatment.  Patient's family states "we were supposed to have her direct admitted in [facility] for fluids".  They were unable to make it to there facility today.

## 2014-10-17 NOTE — Discharge Instructions (Signed)

## 2014-10-17 NOTE — ED Notes (Signed)
Pt discharged home after verbalizing understanding of discharge instructions; nad noted. 

## 2014-10-17 NOTE — ED Provider Notes (Signed)
[facility] Emergency Department Provider Note  ____________________________________________  Time seen: 9:35 AM  I have reviewed the triage vital signs and the nursing notes.   HISTORY  Chief Complaint Dehydration    HPI Sandy [name] is a 69 y.o. 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 She has had nausea and poor oral intake since then. Family reports that they were supposed to take the patient to med bed this morning for IV fluids, but the patient was too tired to go there today on an outpatient basis and so they came here instead.  No chest pain or shortness of breath. No headache abdominal pain vomiting or diarrhea. The patient did have a fall this morning that was witnessed and not syncopal, with mild impact on the left shoulder. The patient does not complain of pain  in this area.     Past Medical History  Diagnosis Date  . Hypertension   . Diabetes mellitus without complication   . Arthritis   . Cancer     liver    Patient Active Problem List   Diagnosis Date Noted  . Colon cancer 09/21/2014  . Metastases to the liver 09/21/2014  . Colon cancer metastasized to liver 09/18/2014  . Cancer related pain 09/18/2014  . Dysuria 09/18/2014  . Anemia 09/18/2014  . Anorexia 09/18/2014  . Weight loss, unintentional 09/18/2014  . Chest pain 08/28/2014    Past Surgical History  Procedure Laterality Date  . Colonoscopy with propofol N/A 09/07/2014    Procedure: COLONOSCOPY WITH PROPOFOL;  Surgeon: Lucilla Lame, MD;  Location: [facility] ENDOSCOPY;  Service: Endoscopy;  Laterality: N/A;  . Esophagogastroduodenoscopy N/A 09/07/2014    Procedure: ESOPHAGOGASTRODUODENOSCOPY (EGD);  Surgeon: Lucilla Lame, MD;  Location: [facility] ENDOSCOPY;  Service: Endoscopy;  Laterality:  N/A;  . Portacath placement Left 09/24/2014    Procedure: INSERTION PORT-A-CATH;  Surgeon: Robert Bellow, MD;  Location: [facility] ORS;  Service: General;  Laterality: Left;  . Liver biopsy      Current Outpatient Rx  Name  Route  Sig  Dispense  Refill  . amLODipine (NORVASC) 5 MG tablet   Oral   Take 5 mg by mouth daily.         Marland Kitchen aspirin EC 81 MG tablet   Oral   Take 81 mg by mouth daily.         Marland Kitchen atorvastatin (LIPITOR) 40 MG tablet   Oral   Take 40 mg by mouth daily.         Marland Kitchen glipiZIDE (GLUCOTROL XL) 10 MG 24 hr tablet   Oral   Take 10 mg by mouth daily with breakfast.         . lidocaine-prilocaine (EMLA) cream   Topical   Apply 1 application topically as needed. Apply 1-2 hours prior to chemotherapy   30 g   1   . lisinopril-hydrochlorothiazide (PRINZIDE,ZESTORETIC) 20-25 MG per tablet   Oral   Take 1 tablet by mouth daily.         . metFORMIN (GLUCOPHAGE) 1000 MG tablet   Oral   Take 1,000 mg by mouth 2 (two) times daily.          . metoprolol (TOPROL-XL) 200 MG 24 hr tablet   Oral   Take 200 mg by mouth  daily.         . Multiple Vitamin (MULTIVITAMIN WITH MINERALS) TABS tablet   Oral   Take 1 tablet by mouth daily.         . ondansetron (ZOFRAN) 4 MG tablet   Oral   Take 1 tablet (4 mg total) by mouth every 8 (eight) hours as needed for nausea or vomiting.   30 tablet   2   . oxyCODONE-acetaminophen (PERCOCET/ROXICET) 5-325 MG per tablet   Oral   Take 1 tablet by mouth every 6 (six) hours as needed for severe pain.   20 tablet   0   . pioglitazone (ACTOS) 30 MG tablet   Oral   Take 30 mg by mouth daily.         Marland Kitchen zolpidem (AMBIEN) 10 MG tablet   Oral   Take 1 tablet by mouth at bedtime as needed.         . lidocaine-prilocaine (EMLA) cream   Topical   Apply 1 application topically as needed.   30 g   1     Allergies Review of patient's allergies indicates no known allergies.  Family History  Problem Relation Age  of Onset  . Cancer Sister     Social History History  Substance Use Topics  . Smoking status: Former Smoker -- 0.25 packs/day for 20 years    Types: Cigarettes    Quit date: 04/14/2003  . Smokeless tobacco: Never Used  . Alcohol Use: No    Review of Systems  Constitutional: No fever or chills. No weight changes. Generalized weakness Eyes:No blurry vision or double vision.  ENT: No sore throat. Cardiovascular: No chest pain. Respiratory: No dyspnea or cough. Gastrointestinal: Negative for abdominal pain, vomiting and diarrhea.  No BRBPR or melena. Genitourinary: Negative for dysuria, urinary retention, bloody urine, or difficulty urinating. Musculoskeletal: Negative for back pain. No joint swelling or pain. Skin: Negative for rash. Neurological: Negative for headaches, focal weakness or numbness. Psychiatric:No anxiety or depression.   Endocrine:No hot/cold intolerance, changes in energy, or sleep difficulty.  10-point ROS otherwise negative.  ____________________________________________   PHYSICAL EXAM:  VITAL SIGNS: ED Triage Vitals  Enc Vitals Group     BP 10/17/14 0953 109/63 mmHg     Pulse Rate 10/17/14 0953 78     Resp 10/17/14 0953 21     Temp 10/17/14 0953 98.3 F (36.8 C)     Temp Source 10/17/14 0953 Oral     SpO2 10/17/14 0953 98 %     Weight 10/17/14 0953 124 lb (56.246 kg)     Height 10/17/14 0953 5\' 5"  (1.651 m)     Head Cir --      Peak Flow --      Pain Score 10/17/14 0954 4     Pain Loc --      Pain Edu? --      Excl. in [place]? --      Constitutional: Alert and oriented. Well appearing and in no distress. Eyes: No scleral icterus. No conjunctival pallor. PERRL. EOMI ENT   Head: Normocephalic and atraumatic.   Nose: No congestion/rhinnorhea. No septal hematoma   Mouth/Throat: Dry mucous membranes, no pharyngeal erythema. No peritonsillar mass. No uvula shift.   Neck: No stridor. No SubQ emphysema. No  meningismus. Hematological/Lymphatic/Immunilogical: No cervical lymphadenopathy. Cardiovascular: RRR. Normal and symmetric distal pulses are present in all extremities. No murmurs, rubs, or gallops. Respiratory: Normal respiratory effort without tachypnea nor retractions. Breath sounds are clear and  equal bilaterally. No wheezes/rales/rhonchi. Gastrointestinal: Soft and nontender. No distention. There is no CVA tenderness.  No rebound, rigidity, or guarding. Genitourinary: deferred Musculoskeletal: Nontender with normal range of motion in all extremities. No joint effusions.  No lower extremity tenderness.  No edema. Left shoulder unremarkable Neurologic:   Normal speech and language.  CN 2-10 normal. Motor grossly intact. No pronator drift.  Normal gait. No gross focal neurologic deficits are appreciated.  Skin:  Skin is warm, dry and intact. No rash noted.  No petechiae, purpura, or bullae. Poor skin turgor Psychiatric: Mood and affect are normal. Speech and behavior are normal. Patient exhibits appropriate insight and judgment.  ____________________________________________    LABS (pertinent positives/negatives) (all labs ordered are listed, but only abnormal results are displayed) Labs Reviewed  COMPREHENSIVE METABOLIC PANEL - Abnormal; Notable for the following:    Sodium 134 (*)    Chloride 95 (*)    Glucose, Bld 203 (*)    Total Protein 6.1 (*)    Albumin 2.1 (*)    AST 44 (*)    ALT 11 (*)    Alkaline Phosphatase 216 (*)    All other components within normal limits  TROPONIN I - Abnormal; Notable for the following:    Troponin I 0.04 (*)    All other components within normal limits  CBC WITH DIFFERENTIAL/PLATELET - Abnormal; Notable for the following:    WBC 17.2 (*)    RBC 2.99 (*)    Hemoglobin 7.7 (*)    HCT 24.2 (*)    MCH 25.6 (*)    MCHC 31.6 (*)    RDW 18.3 (*)    Platelets 515 (*)    Neutro Abs 15.0 (*)    Lymphs Abs 0.6 (*)    Monocytes Absolute 1.6 (*)     All other components within normal limits  URINALYSIS COMPLETEWITH MICROSCOPIC ([facility] ONLY) - Abnormal; Notable for the following:    Color, Urine AMBER (*)    APPearance HAZY (*)    Protein, ur 30 (*)    Leukocytes, UA TRACE (*)    Bacteria, UA RARE (*)    Squamous Epithelial / LPF 0-5 (*)    All other components within normal limits  LIPASE, BLOOD  TROPONIN I   ____________________________________________   EKG  Interpreted by me Normal sinus rhythm rate of 83, normal axis and intervals. Normal QRS and ST segments. There are T-wave inversions in V3 through V6 as well as 1 and aVL. When compared to prior EKG on 08/28/2014, there are no significant changes.  ____________________________________________    RADIOLOGY  Chest x-ray unremarkable  ____________________________________________   PROCEDURES  ____________________________________________   INITIAL IMPRESSION / ASSESSMENT AND PLAN / ED COURSE  Pertinent labs & imaging results that were available during my care of the patient were reviewed by me and considered in my medical decision making (see chart for details).  Patient presents with malaise and generalized weakness and physical exam findings consistent with dehydration. History is also suggestive of dehydration. I reviewed the clinic notes from last week and this week and noted that the patient does appear to be doing well overall although she is in early stages of treatment for stage IV colon cancer and just started chemotherapy and is likely having some side effects symptoms from that affecting her oral intake. We'll give her IV fluids and check some labs to confirm that she did have a recent finding of mild hyponatremia and will need to verify that that did  not worsen.  ----------------------------------------- 2:33 PM on 10/17/2014 -----------------------------------------  Workup unremarkable including electrolytes and troponin 2. No evidence of  infection. Patient is feeling better and more energetic after 2 L IV fluids. We'll discharge the patient home and have her follow up with primary care and oncology for continued management of her symptoms and cancer.  ____________________________________________   FINAL CLINICAL IMPRESSION(S) / ED DIAGNOSES  Final diagnoses:  Generalized weakness  Mild dehydration      Carrie Mew, MD 10/17/14 1433

## 2014-10-17 NOTE — ED Notes (Signed)
Pt's daughter requested no information be given to Education officer, museum.

## 2014-10-18 ENCOUNTER — Other Ambulatory Visit: Payer: Self-pay

## 2014-10-18 ENCOUNTER — Inpatient Hospital Stay: Payer: Medicare Other

## 2014-10-18 VITALS — BP 95/55 | HR 68 | Temp 97.0°F | Resp 16

## 2014-10-18 DIAGNOSIS — D649 Anemia, unspecified: Secondary | ICD-10-CM

## 2014-10-18 DIAGNOSIS — C189 Malignant neoplasm of colon, unspecified: Secondary | ICD-10-CM | POA: Diagnosis not present

## 2014-10-18 DIAGNOSIS — C787 Secondary malignant neoplasm of liver and intrahepatic bile duct: Principal | ICD-10-CM

## 2014-10-18 LAB — SAMPLE TO BLOOD BANK

## 2014-10-18 LAB — PREPARE RBC (CROSSMATCH)

## 2014-10-18 MED ORDER — HEPARIN SOD (PORK) LOCK FLUSH 100 UNIT/ML IV SOLN
INTRAVENOUS | Status: AC
  Filled 2014-10-18: qty 5

## 2014-10-18 MED ORDER — SODIUM CHLORIDE 0.9 % IJ SOLN
10.0000 mL | INTRAMUSCULAR | Status: DC | PRN
  Administered 2014-10-18: 10 mL via INTRAVENOUS
  Filled 2014-10-18: qty 10

## 2014-10-18 MED ORDER — HEPARIN SOD (PORK) LOCK FLUSH 100 UNIT/ML IV SOLN
500.0000 [IU] | Freq: Once | INTRAVENOUS | Status: AC
  Administered 2014-10-18: 500 [IU] via INTRAVENOUS

## 2014-10-18 MED ORDER — SODIUM CHLORIDE 0.9 % IV SOLN
Freq: Once | INTRAVENOUS | Status: AC
  Administered 2014-10-18: 10:00:00 via INTRAVENOUS
  Filled 2014-10-18: qty 1000

## 2014-10-18 MED ORDER — KETOROLAC TROMETHAMINE 15 MG/ML IJ SOLN
15.0000 mg | Freq: Once | INTRAMUSCULAR | Status: AC
  Administered 2014-10-18: 15 mg via INTRAVENOUS
  Filled 2014-10-18: qty 1

## 2014-10-18 NOTE — Progress Notes (Signed)
Patient here for IV infusion/hydration.In a W/C, took 2 people to help her out of W/C into recliner. Pt has strange affect; chooses to look at you and answer questions at times. Other times she seems to ignore all questions. Pt agitated with port accesss. Daughter reports pt went to ER yest and received IV fluids. Pt weak, not eating very much appetite is poor. Pt is drinking fluids. Complaining of back pain. Restless and moving around in chair. MD called. Dr Mike Gip ordered toradol for pain. Pt will return to cancer center in [place] tomm for blood transfusion.

## 2014-10-19 ENCOUNTER — Inpatient Hospital Stay: Payer: Medicare Other

## 2014-10-19 VITALS — BP 132/85 | HR 66 | Temp 97.0°F | Resp 20

## 2014-10-19 DIAGNOSIS — C189 Malignant neoplasm of colon, unspecified: Secondary | ICD-10-CM

## 2014-10-19 DIAGNOSIS — C787 Secondary malignant neoplasm of liver and intrahepatic bile duct: Secondary | ICD-10-CM

## 2014-10-19 DIAGNOSIS — D649 Anemia, unspecified: Secondary | ICD-10-CM

## 2014-10-19 MED ORDER — DIPHENHYDRAMINE HCL 25 MG PO CAPS
25.0000 mg | ORAL_CAPSULE | Freq: Once | ORAL | Status: AC
  Administered 2014-10-19: 25 mg via ORAL
  Filled 2014-10-19: qty 1

## 2014-10-19 MED ORDER — OXYCODONE-ACETAMINOPHEN 5-325 MG PO TABS
1.0000 | ORAL_TABLET | Freq: Once | ORAL | Status: AC
  Administered 2014-10-19: 1 via ORAL
  Filled 2014-10-19: qty 1

## 2014-10-19 MED ORDER — SODIUM CHLORIDE 0.9 % IV SOLN
250.0000 mL | Freq: Once | INTRAVENOUS | Status: AC
  Administered 2014-10-19: 10 mL/h via INTRAVENOUS
  Filled 2014-10-19: qty 250

## 2014-10-19 MED ORDER — ACETAMINOPHEN 325 MG PO TABS: 650.0000 mg | ORAL_TABLET | Freq: Once | ORAL | Status: AC

## 2014-10-21 ENCOUNTER — Encounter: Payer: Self-pay | Admitting: Hematology and Oncology

## 2014-10-21 DIAGNOSIS — E871 Hypo-osmolality and hyponatremia: Secondary | ICD-10-CM | POA: Insufficient documentation

## 2014-10-23 ENCOUNTER — Inpatient Hospital Stay: Payer: Medicare Other

## 2014-10-23 ENCOUNTER — Inpatient Hospital Stay (HOSPITAL_BASED_OUTPATIENT_CLINIC_OR_DEPARTMENT_OTHER): Payer: Medicare Other | Admitting: Hematology and Oncology

## 2014-10-23 ENCOUNTER — Other Ambulatory Visit: Payer: Self-pay

## 2014-10-23 VITALS — BP 104/70 | HR 72 | Temp 97.4°F | Resp 17 | Ht 65.0 in | Wt 125.2 lb

## 2014-10-23 DIAGNOSIS — Z87891 Personal history of nicotine dependence: Secondary | ICD-10-CM

## 2014-10-23 DIAGNOSIS — C189 Malignant neoplasm of colon, unspecified: Secondary | ICD-10-CM | POA: Diagnosis not present

## 2014-10-23 DIAGNOSIS — Z79899 Other long term (current) drug therapy: Secondary | ICD-10-CM

## 2014-10-23 DIAGNOSIS — R634 Abnormal weight loss: Secondary | ICD-10-CM | POA: Diagnosis not present

## 2014-10-23 DIAGNOSIS — E876 Hypokalemia: Secondary | ICD-10-CM | POA: Insufficient documentation

## 2014-10-23 DIAGNOSIS — C787 Secondary malignant neoplasm of liver and intrahepatic bile duct: Principal | ICD-10-CM

## 2014-10-23 DIAGNOSIS — E119 Type 2 diabetes mellitus without complications: Secondary | ICD-10-CM

## 2014-10-23 DIAGNOSIS — R5383 Other fatigue: Secondary | ICD-10-CM

## 2014-10-23 DIAGNOSIS — Z7982 Long term (current) use of aspirin: Secondary | ICD-10-CM

## 2014-10-23 DIAGNOSIS — M129 Arthropathy, unspecified: Secondary | ICD-10-CM

## 2014-10-23 DIAGNOSIS — I10 Essential (primary) hypertension: Secondary | ICD-10-CM

## 2014-10-23 DIAGNOSIS — E871 Hypo-osmolality and hyponatremia: Secondary | ICD-10-CM

## 2014-10-23 DIAGNOSIS — R110 Nausea: Secondary | ICD-10-CM

## 2014-10-23 LAB — COMPREHENSIVE METABOLIC PANEL
ALT: 14 U/L (ref 14–54)
AST: 28 U/L (ref 15–41)
Albumin: 2.1 g/dL — ABNORMAL LOW (ref 3.5–5.0)
Alkaline Phosphatase: 216 U/L — ABNORMAL HIGH (ref 38–126)
Anion gap: 11 (ref 5–15)
BUN: 13 mg/dL (ref 6–20)
CO2: 24 mmol/L (ref 22–32)
Calcium: 8.6 mg/dL — ABNORMAL LOW (ref 8.9–10.3)
Chloride: 98 mmol/L — ABNORMAL LOW (ref 101–111)
Creatinine, Ser: 0.71 mg/dL (ref 0.44–1.00)
GFR calc Af Amer: >60 mL/min (ref >60)
GFR calc non Af Amer: >60 mL/min (ref >60)
Glucose, Bld: 173 mg/dL — ABNORMAL HIGH (ref 65–99)
Potassium: 3.3 mmol/L — ABNORMAL LOW (ref 3.5–5.1)
Sodium: 133 mmol/L — ABNORMAL LOW (ref 135–145)
Total Bilirubin: 0.6 mg/dL (ref 0.3–1.2)
Total Protein: 6.2 g/dL — ABNORMAL LOW (ref 6.5–8.1)

## 2014-10-23 LAB — CBC WITH DIFFERENTIAL/PLATELET
Basophils Absolute: 0.1 10*3/uL (ref 0–0.1)
Basophils Relative: 1 %
Eosinophils Absolute: 0 10*3/uL (ref 0–0.7)
Eosinophils Relative: 0 %
HCT: 32.9 % — ABNORMAL LOW (ref 35.0–47.0)
Hemoglobin: 10.6 g/dL — ABNORMAL LOW (ref 12.0–16.0)
Lymphocytes Relative: 7 %
Lymphs Abs: 0.7 10*3/uL — ABNORMAL LOW (ref 1.0–3.6)
MCH: 26.7 pg (ref 26.0–34.0)
MCHC: 32.3 g/dL (ref 32.0–36.0)
MCV: 82.8 fL (ref 80.0–100.0)
Monocytes Absolute: 0.9 10*3/uL (ref 0.2–0.9)
Monocytes Relative: 10 %
Neutro Abs: 7.4 10*3/uL — ABNORMAL HIGH (ref 1.4–6.5)
Neutrophils Relative %: 82 %
Platelets: 712 10*3/uL — ABNORMAL HIGH (ref 150–440)
RBC: 3.97 MIL/uL (ref 3.80–5.20)
RDW: 17.6 % — ABNORMAL HIGH (ref 11.5–14.5)
WBC: 9 10*3/uL (ref 3.6–11.0)

## 2014-10-23 MED ORDER — OXALIPLATIN CHEMO INJECTION 100 MG/20ML
85.0000 mg/m2 | Freq: Once | INTRAVENOUS | Status: AC
  Administered 2014-10-23: 140 mg via INTRAVENOUS
  Filled 2014-10-23: qty 20

## 2014-10-23 MED ORDER — FLUOROURACIL CHEMO INJECTION 2.5 GM/50ML
400.0000 mg/m2 | Freq: Once | INTRAVENOUS | Status: AC
  Administered 2014-10-23: 650 mg via INTRAVENOUS
  Filled 2014-10-23: qty 13

## 2014-10-23 MED ORDER — SODIUM CHLORIDE 0.9 % IV SOLN
2400.0000 mg/m2 | INTRAVENOUS | Status: AC
  Administered 2014-10-23: 3900 mg via INTRAVENOUS
  Filled 2014-10-23: qty 78

## 2014-10-23 MED ORDER — POTASSIUM CHLORIDE CRYS ER 10 MEQ PO TBCR: 10.0000 meq | EXTENDED_RELEASE_TABLET | Freq: Once | ORAL | Status: DC

## 2014-10-23 MED ORDER — DEXTROSE 5 % IV SOLN
Freq: Once | INTRAVENOUS | Status: AC
  Administered 2014-10-23: 11:00:00 via INTRAVENOUS
  Filled 2014-10-23: qty 1000

## 2014-10-23 MED ORDER — OXYCODONE-ACETAMINOPHEN 5-325 MG PO TABS: 1.0000 | ORAL_TABLET | Freq: Four times a day (QID) | ORAL | Status: DC | PRN

## 2014-10-23 MED ORDER — MEGESTROL ACETATE 400 MG/10ML PO SUSP
200.0000 mg | Freq: Every day | ORAL | Status: DC
Start: 2014-10-23 — End: 2014-11-06

## 2014-10-23 MED ORDER — DEXTROSE 5 % IV SOLN
650.0000 mg | Freq: Once | INTRAVENOUS | Status: AC
  Administered 2014-10-23: 650 mg via INTRAVENOUS
  Filled 2014-10-23: qty 25

## 2014-10-23 MED ORDER — SODIUM CHLORIDE 0.9 % IV SOLN
Freq: Once | INTRAVENOUS | Status: AC
  Administered 2014-10-23: 11:00:00 via INTRAVENOUS
  Filled 2014-10-23: qty 4

## 2014-10-23 NOTE — Progress Notes (Signed)
[facility] day:  10/23/2014  Chief Complaint: Sandy [name] is a 69 y.o. female with stage IV colon cancer who is seen for assessment prior to cycle #2 FOLFOX chemotherapy.  HPI: The patient was last seen in the medical oncology clinic on 10/16/2014.  At that time,  she was seen for nadir assessment following cycle #1 FOLFOX chemotherapy.  Symptomatically, she was fatigued.  She was eating poorly.  Her hematocrit was drifting down (24.6).  She had hyponatremia (Na 127) from drinking plain water.  She received IVF (1 liter NS) x 3 days.  She received 1 unit of PRBCs on 10/19/2014.  During the interim, she has continued to eat poorly.  She remains fatigued.  She fell this morning and hit her left side.  She did not hit her head.  She feels "tired but keeps going".  Past Medical History  Diagnosis Date  . Hypertension   . Diabetes mellitus without complication   . Arthritis   . Cancer     liver    Past Surgical History  Procedure Laterality Date  . Colonoscopy with propofol N/A 09/07/2014    Procedure: COLONOSCOPY WITH PROPOFOL;  Surgeon: Lucilla Lame, MD;  Location: [facility] ENDOSCOPY;  Service: Endoscopy;  Laterality: N/A;  . Esophagogastroduodenoscopy N/A 09/07/2014    Procedure: ESOPHAGOGASTRODUODENOSCOPY (EGD);  Surgeon: Lucilla Lame, MD;  Location: [facility] ENDOSCOPY;  Service: Endoscopy;  Laterality: N/A;  . Portacath placement Left 09/24/2014    Procedure: INSERTION PORT-A-CATH;  Surgeon: Robert Bellow, MD;  Location: [facility] ORS;  Service: General;  Laterality: Left;  . Liver biopsy      Family History  Problem Relation Age of Onset  . Cancer Sister     Social History:  reports that she quit smoking about 11 years ago. Her smoking use included Cigarettes. She has a 5 pack-year smoking history. She has never used smokeless tobacco. She reports that she does not drink alcohol or use illicit drugs.  The patient is accompanied by her  daughter who is often tearful trying to get her mother to eat or participate in activities.  Allergies: No Known Allergies  Current Medications: Current Outpatient Prescriptions  Medication Sig Dispense Refill  . amLODipine (NORVASC) 5 MG tablet Take 5 mg by mouth daily.    Marland Kitchen aspirin EC 81 MG tablet Take 81 mg by mouth daily.    Marland Kitchen atorvastatin (LIPITOR) 40 MG tablet Take 40 mg by mouth daily.    Marland Kitchen glipiZIDE (GLUCOTROL XL) 10 MG 24 hr tablet Take 10 mg by mouth daily with breakfast.    . lidocaine-prilocaine (EMLA) cream Apply 1 application topically as needed. 30 g 1  . lidocaine-prilocaine (EMLA) cream Apply 1 application topically as needed. Apply 1-2 hours prior to chemotherapy 30 g 1  . lisinopril-hydrochlorothiazide (PRINZIDE,ZESTORETIC) 20-25 MG per tablet Take 1 tablet by mouth daily.    . metFORMIN (GLUCOPHAGE) 1000 MG tablet Take 1,000 mg by mouth 2 (two) times daily.     . metoprolol (TOPROL-XL) 200 MG 24 hr tablet Take 200 mg by mouth daily.    . Multiple Vitamin (MULTIVITAMIN WITH MINERALS) TABS tablet Take 1 tablet by mouth daily.    . ondansetron (ZOFRAN) 4 MG tablet Take 1 tablet (4 mg total) by mouth every 8 (eight) hours as needed for nausea or vomiting. 30 tablet 2  . pioglitazone (ACTOS) 30 MG tablet Take 30 mg by mouth daily.    Marland Kitchen zolpidem (AMBIEN) 10 MG  tablet Take 1 tablet by mouth at bedtime as needed.    . megestrol (MEGACE) 400 MG/10ML suspension Take 5 mLs (200 mg total) by mouth daily. to stimulate appetite 240 mL 0  . oxyCODONE-acetaminophen (PERCOCET/ROXICET) 5-325 MG per tablet Take 1 tablet by mouth every 6 (six) hours as needed for severe pain. 20 tablet 0  . potassium chloride SA (K-DUR,KLOR-CON) 10 MEQ tablet Take 1 tablet (10 mEq total) by mouth once. Take 1 pill twice a day for 3 days then 1 pill a day. 20 tablet 0   No current facility-administered medications for this visit.   Facility-Administered Medications Ordered in Other Visits  Medication Dose  Route Frequency Provider Last Rate Last Dose  . acetaminophen (TYLENOL) tablet 650 mg  650 mg Oral Once Lequita Asal, MD   650 mg at 10/19/14 6381  . oxyCODONE-acetaminophen (PERCOCET/ROXICET) 5-325 MG per tablet 1 tablet  1 tablet Oral Q6H PRN Lequita Asal, MD   1 tablet at 11/06/14 1217  . sodium chloride 0.9 % injection 10 mL  10 mL Intracatheter PRN Lequita Asal, MD   10 mL at 10/11/14 1431    Review of Systems:  GENERAL: General fatigue. Stays in bed or on couch much of the day. Weight continues to slowly drift down.  No fevers or sweats. PERFORMANCE STATUS (ECOG): 3. HEENT: Wears a hearing aide. No visual changes, runny nose, sore throat, mouth sores or tenderness. Lungs: No shortness of breath or cough. No hemoptysis. Cardiac: No chest pain, palpitations, orthopnea, or PND. GI: Upper abdominal pain on/off.Poor appetite.  No nausea, vomiting, diarrhea, constipation, melena or hematochezia. GU: Wears a Depends pad.  No dysuria or hematuria. Musculoskeletal: No back pain. No joint pain. No muscle tenderness. Extremities: No pain or swelling. Skin: Upper abdominal rash (old). Neuro: No oxaliplatin induced neuropathy.  No headache, numbness or weakness, or balance issues. Endocrine: No diabetes, thyroid issues, hot flashes or night sweats. Psych: No mood changes, depression or anxiety. Pain: Upper abdominal pain. Review of systems: All other systems reviewed and found to be negative.  Physical Exam: Blood pressure 104/70, pulse 72, temperature 97.4 F (36.3 C), temperature source Oral, resp. rate 17, height _0  (1.651 m), weight 125 lb 3.5 oz (56.8 kg). GENERAL: Chronically ill appearing woman sitting comfortably in a wheelchair in the exam room in no acute distress. MENTAL STATUS: Alert and oriented to person, place and time. HEAD: Brown curly hair with red highlights. Normocephalic, atraumatic, face symmetric, no Cushingoid features. EYES:  Glasses. Hazel/brown eyes. Pupils equal round and reactive to light and accomodation. No conjunctivitis or scleral icterus. ENT: Oropharynx clear without lesion. Partials. Tongue normal. Mucous membranes moist.  CHEST:  Port-a-cath with unremarkable exit site. RESPIRATORY: Clear to auscultation without rales, wheezes or rhonchi. CARDIOVASCULAR: Regular rate and rhythm without murmur, rub or gallop. ABDOMEN: Soft, tender right upper quadrant with liver palpable to 2 fingerbreaths above umbilicus. No guarding or rebound tenderness. Left lower quadrant full without discrete mass. SKIN: Dry elongated oval rash beneath right breast (stable to improved). No bruises or ecchymosis s/p fall. EXTREMITIES: No edema, no skin discoloration or tenderness. No palpable cords. LYMPH NODES: No palpable cervical, supraclavicular, axillary or inguinal adenopathy  NEUROLOGICAL: Unremarkable. PSYCH: Appropriate.  Appointment on 10/23/2014  Component Date Value Ref Range Status  . WBC 10/23/2014 9.0  3.6 - 11.0 K/uL Final  . RBC 10/23/2014 3.97  3.80 - 5.20 MIL/uL Final  . Hemoglobin 10/23/2014 10.6* 12.0 - 16.0 g/dL Final  .  HCT 10/23/2014 32.9* 35.0 - 47.0 % Final  . MCV 10/23/2014 82.8  80.0 - 100.0 fL Final  . MCH 10/23/2014 26.7  26.0 - 34.0 pg Final  . MCHC 10/23/2014 32.3  32.0 - 36.0 g/dL Final  . RDW 10/23/2014 17.6* 11.5 - 14.5 % Final  . Platelets 10/23/2014 712* 150 - 440 K/uL Final  . Neutrophils Relative % 10/23/2014 82   Final  . Neutro Abs 10/23/2014 7.4* 1.4 - 6.5 K/uL Final  . Lymphocytes Relative 10/23/2014 7   Final  . Lymphs Abs 10/23/2014 0.7* 1.0 - 3.6 K/uL Final  . Monocytes Relative 10/23/2014 10   Final  . Monocytes Absolute 10/23/2014 0.9  0.2 - 0.9 K/uL Final  . Eosinophils Relative 10/23/2014 0   Final  . Eosinophils Absolute 10/23/2014 0.0  0 - 0.7 K/uL Final  . Basophils Relative 10/23/2014 1   Final  . Basophils Absolute 10/23/2014 0.1  0 - 0.1 K/uL Final  .  Sodium 10/23/2014 133* 135 - 145 mmol/L Final  . Potassium 10/23/2014 3.3* 3.5 - 5.1 mmol/L Final  . Chloride 10/23/2014 98* 101 - 111 mmol/L Final  . CO2 10/23/2014 24  22 - 32 mmol/L Final  . Glucose, Bld 10/23/2014 173* 65 - 99 mg/dL Final  . BUN 10/23/2014 13  6 - 20 mg/dL Final  . Creatinine, Ser 10/23/2014 0.71  0.44 - 1.00 mg/dL Final  . Calcium 10/23/2014 8.6* 8.9 - 10.3 mg/dL Final  . Total Protein 10/23/2014 6.2* 6.5 - 8.1 g/dL Final  . Albumin 10/23/2014 2.1* 3.5 - 5.0 g/dL Final  . AST 10/23/2014 28  15 - 41 U/L Final  . ALT 10/23/2014 14  14 - 54 U/L Final  . Alkaline Phosphatase 10/23/2014 216* 38 - 126 U/L Final  . Total Bilirubin 10/23/2014 0.6  0.3 - 1.2 mg/dL Final  . GFR calc non Af Amer 10/23/2014 >60  >60 mL/min Final  . GFR calc Af Amer 10/23/2014 >60  >60 mL/min Final   Comment: (NOTE) The eGFR has been calculated using the CKD EPI equation. This calculation has not been validated in all clinical situations. eGFR's persistently <60 mL/min signify possible Chronic Kidney Disease.   . Anion gap 10/23/2014 11  5 - 15 Final  . CEA 10/23/2014 12034.0* 0.0 - 4.7 ng/mL Final   Comment: (NOTE) Results confirmed on dilution.       Roche ECLIA methodology       Nonsmokers  <3.9                                     Smokers     <5.6 Performed At: [facility] [place], [place] [id number] Lindon Romp MD SL:[id number]     Assessment:  Sandy [name] is a 69 y.o. female with metastatic colon cancer. She presented with a 53 pound weight loss in 6 months. Colonoscopy on 09/07/2014 revealed a circumferential partially obstructive fungating mass in the descending colon. There was no active bleeding. There was a 1.5 cm pedunculated polyp in the sigmoid colon (tubulovillous adenoma). Pathology from the descending colon mass revealed fragments of adenocarcinoma. CEA was 11,793 on 09/05/2014.  Chest CT angiogram on 08/28/2014 revealed no  evidence of pulmonary embolism, but multiple large enhancing liver masses (largest 6.4 cm).  PET scan on 10/04/2014 revealed 6.5 x 6.1 cm hypermetabolic mass in the proximal sigmoid colon,  There were  numerous large hypermetabolic hepatic metastases. There were no additional sites of metastatic disease in the neck, chest or skeleton.  In addition, there was a 3.2 x 2.8 cm low-attenuation right thyroid nodule.   She is currently day 14 of cycle #1 FOLFOX chemotherapy (10/09/2014).  She tolerated her chemotherapy well with minimal nausea and no diarrhea or neuropathy.  She experienced hyponatremia (Na 127) from drinking too much free water.  She received 1 unit of PRBCs on 10/19/2014.  Symptomatically, she continues to lose weight.  Her hyponatremia has improved.  She has mild hypokalemia.  Plan: 1.  Labs today:  CBC with diff, CMP, CEA. 2.  Cycle #2 FOLFOX today. 3.  RTC in 2 days for pump disconnect. 4.  Discuss potential addition of Avastin with next cycle. 5.  Discuss caloric intake.  Rx:  Megace 200 mg po q day. 6.  Rx:  Potassium chloride 10 meq po BID x 3 days then 10 meq po q day. 7.  RTC in 2 weeks for MD assess, labs (CBC with diff, CMP, CEA, Mg), and cycle #3 FOLFOX +/- Avastin.   Lequita Asal, MD  10/23/2014, 9:25 AM

## 2014-10-24 LAB — TYPE AND SCREEN
ABO/RH(D): O POS
Antibody Screen: NEGATIVE
Unit division: 0
Unit division: 0

## 2014-10-25 ENCOUNTER — Inpatient Hospital Stay: Payer: Medicare Other

## 2014-10-25 ENCOUNTER — Telehealth: Payer: Self-pay | Admitting: *Deleted

## 2014-10-25 DIAGNOSIS — C189 Malignant neoplasm of colon, unspecified: Secondary | ICD-10-CM

## 2014-10-25 DIAGNOSIS — C787 Secondary malignant neoplasm of liver and intrahepatic bile duct: Principal | ICD-10-CM

## 2014-10-25 LAB — CEA: CEA: 12034 ng/mL — ABNORMAL HIGH (ref 0.0–4.7)

## 2014-10-25 MED ORDER — HEPARIN SOD (PORK) LOCK FLUSH 100 UNIT/ML IV SOLN
500.0000 [IU] | Freq: Once | INTRAVENOUS | Status: AC | PRN
  Administered 2014-10-25: 500 [IU]

## 2014-10-25 MED ORDER — SODIUM CHLORIDE 0.9 % IJ SOLN
10.0000 mL | INTRAMUSCULAR | Status: DC | PRN
  Administered 2014-10-25: 10 mL
  Filled 2014-10-25: qty 10

## 2014-10-25 MED ORDER — HEPARIN SOD (PORK) LOCK FLUSH 100 UNIT/ML IV SOLN
INTRAVENOUS | Status: AC
  Filled 2014-10-25: qty 5

## 2014-10-25 NOTE — Telephone Encounter (Signed)
After discussing with Dr Mike Gip, I called Sandy [name] back and instructed her to go get some Imodium AD and if after using that she is not better to call us back. She stated she will go get some now

## 2014-11-06 ENCOUNTER — Other Ambulatory Visit: Payer: Medicare Other

## 2014-11-06 ENCOUNTER — Inpatient Hospital Stay (HOSPITAL_BASED_OUTPATIENT_CLINIC_OR_DEPARTMENT_OTHER): Payer: Medicare Other | Admitting: Hematology and Oncology

## 2014-11-06 ENCOUNTER — Inpatient Hospital Stay: Payer: Medicare Other

## 2014-11-06 VITALS — BP 116/74 | HR 54 | Temp 95.8°F | Resp 17 | Ht 65.0 in | Wt 121.1 lb

## 2014-11-06 VITALS — BP 118/76 | HR 62 | Temp 96.1°F | Resp 18

## 2014-11-06 DIAGNOSIS — C787 Secondary malignant neoplasm of liver and intrahepatic bile duct: Secondary | ICD-10-CM | POA: Diagnosis not present

## 2014-11-06 DIAGNOSIS — R5383 Other fatigue: Secondary | ICD-10-CM

## 2014-11-06 DIAGNOSIS — Z87891 Personal history of nicotine dependence: Secondary | ICD-10-CM

## 2014-11-06 DIAGNOSIS — E871 Hypo-osmolality and hyponatremia: Secondary | ICD-10-CM | POA: Diagnosis not present

## 2014-11-06 DIAGNOSIS — I10 Essential (primary) hypertension: Secondary | ICD-10-CM

## 2014-11-06 DIAGNOSIS — E119 Type 2 diabetes mellitus without complications: Secondary | ICD-10-CM

## 2014-11-06 DIAGNOSIS — E876 Hypokalemia: Secondary | ICD-10-CM | POA: Diagnosis not present

## 2014-11-06 DIAGNOSIS — C189 Malignant neoplasm of colon, unspecified: Secondary | ICD-10-CM

## 2014-11-06 DIAGNOSIS — Z7982 Long term (current) use of aspirin: Secondary | ICD-10-CM

## 2014-11-06 DIAGNOSIS — Z79899 Other long term (current) drug therapy: Secondary | ICD-10-CM

## 2014-11-06 DIAGNOSIS — R110 Nausea: Secondary | ICD-10-CM

## 2014-11-06 DIAGNOSIS — M129 Arthropathy, unspecified: Secondary | ICD-10-CM

## 2014-11-06 DIAGNOSIS — G893 Neoplasm related pain (acute) (chronic): Secondary | ICD-10-CM

## 2014-11-06 DIAGNOSIS — R634 Abnormal weight loss: Secondary | ICD-10-CM

## 2014-11-06 LAB — COMPREHENSIVE METABOLIC PANEL
ALT: 10 U/L — ABNORMAL LOW (ref 14–54)
AST: 21 U/L (ref 15–41)
Albumin: 2.3 g/dL — ABNORMAL LOW (ref 3.5–5.0)
Alkaline Phosphatase: 174 U/L — ABNORMAL HIGH (ref 38–126)
Anion gap: 9 (ref 5–15)
BUN: 14 mg/dL (ref 6–20)
CO2: 26 mmol/L (ref 22–32)
Calcium: 9.1 mg/dL (ref 8.9–10.3)
Chloride: 102 mmol/L (ref 101–111)
Creatinine, Ser: 0.69 mg/dL (ref 0.44–1.00)
GFR calc Af Amer: >60 mL/min (ref >60)
GFR calc non Af Amer: >60 mL/min (ref >60)
Glucose, Bld: 111 mg/dL — ABNORMAL HIGH (ref 65–99)
Potassium: 4.2 mmol/L (ref 3.5–5.1)
Sodium: 137 mmol/L (ref 135–145)
Total Bilirubin: 0.5 mg/dL (ref 0.3–1.2)
Total Protein: 6.3 g/dL — ABNORMAL LOW (ref 6.5–8.1)

## 2014-11-06 LAB — CBC WITH DIFFERENTIAL/PLATELET
Basophils Absolute: 0.1 10*3/uL (ref 0–0.1)
Basophils Relative: 1 %
Eosinophils Absolute: 0 10*3/uL (ref 0–0.7)
Eosinophils Relative: 1 %
HCT: 32.2 % — ABNORMAL LOW (ref 35.0–47.0)
Hemoglobin: 10.4 g/dL — ABNORMAL LOW (ref 12.0–16.0)
Lymphocytes Relative: 17 %
Lymphs Abs: 1.3 10*3/uL (ref 1.0–3.6)
MCH: 26.7 pg (ref 26.0–34.0)
MCHC: 32.1 g/dL (ref 32.0–36.0)
MCV: 83.2 fL (ref 80.0–100.0)
Monocytes Absolute: 0.8 10*3/uL (ref 0.2–0.9)
Monocytes Relative: 10 %
Neutro Abs: 5.6 10*3/uL (ref 1.4–6.5)
Neutrophils Relative %: 71 %
Platelets: 472 10*3/uL — ABNORMAL HIGH (ref 150–440)
RBC: 3.87 MIL/uL (ref 3.80–5.20)
RDW: 17.9 % — ABNORMAL HIGH (ref 11.5–14.5)
WBC: 7.8 10*3/uL (ref 3.6–11.0)

## 2014-11-06 MED ORDER — HEPARIN SOD (PORK) LOCK FLUSH 100 UNIT/ML IV SOLN: 500.0000 [IU] | Freq: Once | INTRAVENOUS | Status: DC | PRN

## 2014-11-06 MED ORDER — MEGESTROL ACETATE 400 MG/10ML PO SUSP: 200.0000 mg | Freq: Every day | ORAL | Status: AC

## 2014-11-06 MED ORDER — FLUOROURACIL CHEMO INJECTION 2.5 GM/50ML
400.0000 mg/m2 | Freq: Once | INTRAVENOUS | Status: AC
Start: 2014-11-06 — End: 2014-11-06
  Administered 2014-11-06: 650 mg via INTRAVENOUS
  Filled 2014-11-06: qty 13

## 2014-11-06 MED ORDER — OXYCODONE-ACETAMINOPHEN 5-325 MG PO TABS: 1.0000 | ORAL_TABLET | Freq: Four times a day (QID) | ORAL | Status: DC | PRN

## 2014-11-06 MED ORDER — LEUCOVORIN CALCIUM INJECTION 350 MG
650.0000 mg | Freq: Once | INTRAVENOUS | Status: AC
  Administered 2014-11-06: 650 mg via INTRAVENOUS
  Filled 2014-11-06: qty 32.5

## 2014-11-06 MED ORDER — SODIUM CHLORIDE 0.9 % IJ SOLN
10.0000 mL | INTRAMUSCULAR | Status: DC | PRN
  Filled 2014-11-06: qty 10

## 2014-11-06 MED ORDER — DEXTROSE 5 % IV SOLN
85.0000 mg/m2 | Freq: Once | INTRAVENOUS | Status: AC
  Administered 2014-11-06: 140 mg via INTRAVENOUS
  Filled 2014-11-06: qty 28

## 2014-11-06 MED ORDER — OXYCODONE-ACETAMINOPHEN 5-325 MG PO TABS
1.0000 | ORAL_TABLET | Freq: Four times a day (QID) | ORAL | Status: DC | PRN
  Administered 2014-11-06: 1 via ORAL
  Filled 2014-11-06: qty 1

## 2014-11-06 MED ORDER — DEXTROSE 5 % IV SOLN
Freq: Once | INTRAVENOUS | Status: AC
  Administered 2014-11-06: 11:00:00 via INTRAVENOUS
  Filled 2014-11-06: qty 1000

## 2014-11-06 MED ORDER — SODIUM CHLORIDE 0.9 % IV SOLN
2400.0000 mg/m2 | INTRAVENOUS | Status: DC
  Administered 2014-11-06: 3900 mg via INTRAVENOUS
  Filled 2014-11-06: qty 78

## 2014-11-06 MED ORDER — SODIUM CHLORIDE 0.9 % IV SOLN
Freq: Once | INTRAVENOUS | Status: AC
  Administered 2014-11-06: 11:00:00 via INTRAVENOUS
  Filled 2014-11-06: qty 4

## 2014-11-06 NOTE — Progress Notes (Signed)
At 11:39 Patient grimacing and states she wants something for her back pain, rates it a 5 out of 10.  Zachery Dauer, RN who spoke with Dr. Mike Gip about ordering something for the patient's pain.  Dr. Mike Gip ordered one Percocet.  Percocet administered at 12:17.  At 12:47 patient rates pain 1 out of 5 and patient resting.

## 2014-11-06 NOTE — Progress Notes (Signed)
[facility] day:  11/06/2014  Chief Complaint: Sandy [name] is a 69 y.o. female with stage IV colon cancer who is seen for assessment prior to cycle #3 FOLFOX chemotherapy.  HPI: The patient was last seen in the medical oncology clinic on 10/23/2014.  At that time,  she was seen for assessment prior to cycle #2 FOLFOX chemotherapy.  She was eating poorly.  We discussed the importance of her caloric intake.  Megace was prescribed.  She had mild hypokalemia.  Potassium was prescribed.  She received her chemotherapy uneventfully.  During the interim, she states that she has been eating well on Megace. She has gone through her entire bottle as she was inadvertently taking too much. She states that she took Imodium for diarrhea for 2 days. Diarrhea has resolved.  She continues to perform her activities of daily living. Her daughter states that she sleeps a lot. She is taking one potassium pill a day.   Past Medical History  Diagnosis Date  . Hypertension   . Diabetes mellitus without complication   . Arthritis   . Cancer     liver    Past Surgical History  Procedure Laterality Date  . Colonoscopy with propofol N/A 09/07/2014    Procedure: COLONOSCOPY WITH PROPOFOL;  Surgeon: Lucilla Lame, MD;  Location: [facility] ENDOSCOPY;  Service: Endoscopy;  Laterality: N/A;  . Esophagogastroduodenoscopy N/A 09/07/2014    Procedure: ESOPHAGOGASTRODUODENOSCOPY (EGD);  Surgeon: Lucilla Lame, MD;  Location: [facility] ENDOSCOPY;  Service: Endoscopy;  Laterality: N/A;  . Portacath placement Left 09/24/2014    Procedure: INSERTION PORT-A-CATH;  Surgeon: Robert Bellow, MD;  Location: [facility] ORS;  Service: General;  Laterality: Left;  . Liver biopsy      Family History  Problem Relation Age of Onset  . Cancer Sister     Social History:  reports that she quit smoking about 11 years ago. Her smoking use included Cigarettes. She has a 5 pack-year smoking history. She has  never used smokeless tobacco. She reports that she does not drink alcohol or use illicit drugs.  The patient is accompanied by her daughter and [name], [name].  Allergies: No Known Allergies  Current Medications: Current Outpatient Prescriptions  Medication Sig Dispense Refill  . amLODipine (NORVASC) 5 MG tablet Take 5 mg by mouth daily.    Marland Kitchen aspirin EC 81 MG tablet Take 81 mg by mouth daily.    Marland Kitchen atorvastatin (LIPITOR) 40 MG tablet Take 40 mg by mouth daily.    Marland Kitchen glipiZIDE (GLUCOTROL XL) 10 MG 24 hr tablet Take 10 mg by mouth daily with breakfast.    . lidocaine-prilocaine (EMLA) cream Apply 1 application topically as needed. 30 g 1  . lidocaine-prilocaine (EMLA) cream Apply 1 application topically as needed. Apply 1-2 hours prior to chemotherapy 30 g 1  . lisinopril-hydrochlorothiazide (PRINZIDE,ZESTORETIC) 20-25 MG per tablet Take 1 tablet by mouth daily.    . metFORMIN (GLUCOPHAGE) 1000 MG tablet Take 1,000 mg by mouth 2 (two) times daily.     . metoprolol (TOPROL-XL) 200 MG 24 hr tablet Take 200 mg by mouth daily.    . Multiple Vitamin (MULTIVITAMIN WITH MINERALS) TABS tablet Take 1 tablet by mouth daily.    . ondansetron (ZOFRAN) 4 MG tablet Take 1 tablet (4 mg total) by mouth every 8 (eight) hours as needed for nausea or vomiting. 30 tablet 2  . pioglitazone (ACTOS) 30 MG tablet Take 30 mg by mouth daily.    Marland Kitchen  potassium chloride SA (K-DUR,KLOR-CON) 10 MEQ tablet Take 1 tablet (10 mEq total) by mouth once. Take 1 pill twice a day for 3 days then 1 pill a day. 20 tablet 0  . zolpidem (AMBIEN) 10 MG tablet Take 1 tablet by mouth at bedtime as needed.    . megestrol (MEGACE) 400 MG/10ML suspension Take 5 mLs (200 mg total) by mouth daily. to stimulate appetite 240 mL 0  . oxyCODONE-acetaminophen (PERCOCET/ROXICET) 5-325 MG per tablet Take 1 tablet by mouth every 6 (six) hours as needed for severe pain. 20 tablet 0   Current Facility-Administered Medications  Medication Dose Route  Frequency Provider Last Rate Last Dose  . oxyCODONE-acetaminophen (PERCOCET/ROXICET) 5-325 MG per tablet 1 tablet  1 tablet Oral Q6H PRN Lequita Asal, MD   1 tablet at 11/06/14 1217   Facility-Administered Medications Ordered in Other Visits  Medication Dose Route Frequency Provider Last Rate Last Dose  . acetaminophen (TYLENOL) tablet 650 mg  650 mg Oral Once Lequita Asal, MD   650 mg at 10/19/14 8299  . fluorouracil (ADRUCIL) 3,900 mg in sodium chloride 0.9 % 72 mL chemo infusion  2,400 mg/m2 (Treatment Plan Actual) Intravenous 1 day or 1 dose Lequita Asal, MD      . fluorouracil (ADRUCIL) chemo injection 650 mg  400 mg/m2 (Treatment Plan Actual) Intravenous Once Lequita Asal, MD      . heparin lock flush 100 unit/mL  500 Units Intracatheter Once PRN Lequita Asal, MD      . leucovorin 650 mg in dextrose 5 % 250 mL infusion  650 mg Intravenous Once Lequita Asal, MD 141 mL/hr at 11/06/14 1157 650 mg at 11/06/14 1157  . oxaliplatin (ELOXATIN) 140 mg in dextrose 5 % 500 mL chemo infusion  85 mg/m2 (Treatment Plan Actual) Intravenous Once Lequita Asal, MD 264 mL/hr at 11/06/14 1157 140 mg at 11/06/14 1157  . sodium chloride 0.9 % injection 10 mL  10 mL Intracatheter PRN Lequita Asal, MD   10 mL at 10/11/14 1431  . sodium chloride 0.9 % injection 10 mL  10 mL Intracatheter PRN Lequita Asal, MD        Review of Systems:  GENERAL: General fatigue. Sleeps a lot. Weight down despite eating more. No fevers or sweats. PERFORMANCE STATUS (ECOG): 3. HEENT: Wears a hearing aide. No visual changes, runny nose, sore throat, mouth sores or tenderness. Lungs: No shortness of breath or cough. No hemoptysis. Cardiac: No chest pain, palpitations, orthopnea, or PND. GI: Upper abdominal pain on/off. Transient diarrhea associated with chemotherapy.  No nausea, vomiting, constipation, melena or hematochezia. GU: Wears a Depends pad.  No dysuria or  hematuria. Musculoskeletal: No back pain. No joint pain. No muscle tenderness. Extremities: No pain or swelling. Skin: No rashes, skin lesions, or ulcers Neuro: No oxaliplatin induced neuropathy.  No headache, numbness or weakness, or balance issues. Endocrine: No diabetes, thyroid issues, hot flashes or night sweats. Psych: No mood changes, depression or anxiety. Pain: Upper abdominal pain. Review of systems: All other systems reviewed and found to be negative.  Physical Exam: Blood pressure 116/74, pulse 54, temperature 95.8 F (35.4 C), temperature source Tympanic, resp. rate 17, height 5' 5"  (1.651 m), weight 121 lb 2.3 oz (54.95 kg). GENERAL: Chronically ill appearing woman sitting comfortably in a wheelchair under a blanket in the exam room in no acute distress. MENTAL STATUS: Alert and oriented to person, place and time. HEAD: Brown curly hair with  red highlights. Normocephalic, atraumatic, face symmetric, no Cushingoid features. EYES: Glasses. Hazel/brown eyes. Pupils equal round and reactive to light and accomodation. No conjunctivitis or scleral icterus. ENT: Oropharynx clear without lesion. Partials. Tongue normal. Mucous membranes moist.  RESPIRATORY: Clear to auscultation without rales, wheezes or rhonchi. CARDIOVASCULAR: Regular rate and rhythm without murmur, rub or gallop. ABDOMEN: Soft, tender right upper quadrant with liver palpable to 2 fingerbreaths above umbilicus. No guarding or rebound tenderness. Left lower quadrant full without discrete mass. SKIN: Dry elongated oval rash beneath right breast (barely visible).  EXTREMITIES: No edema, no skin discoloration or tenderness. No palpable cords. LYMPH NODES: No palpable cervical, supraclavicular, axillary or inguinal adenopathy  NEUROLOGICAL: Unremarkable. PSYCH: Appropriate.  Clinical Support on 11/06/2014  Component Date Value Ref Range Status  . WBC 11/06/2014 7.8  3.6 - 11.0 K/uL Final   . RBC 11/06/2014 3.87  3.80 - 5.20 MIL/uL Final  . Hemoglobin 11/06/2014 10.4* 12.0 - 16.0 g/dL Final  . HCT 11/06/2014 32.2* 35.0 - 47.0 % Final  . MCV 11/06/2014 83.2  80.0 - 100.0 fL Final  . MCH 11/06/2014 26.7  26.0 - 34.0 pg Final  . MCHC 11/06/2014 32.1  32.0 - 36.0 g/dL Final  . RDW 11/06/2014 17.9* 11.5 - 14.5 % Final  . Platelets 11/06/2014 472* 150 - 440 K/uL Final  . Neutrophils Relative % 11/06/2014 71   Final  . Neutro Abs 11/06/2014 5.6  1.4 - 6.5 K/uL Final  . Lymphocytes Relative 11/06/2014 17   Final  . Lymphs Abs 11/06/2014 1.3  1.0 - 3.6 K/uL Final  . Monocytes Relative 11/06/2014 10   Final  . Monocytes Absolute 11/06/2014 0.8  0.2 - 0.9 K/uL Final  . Eosinophils Relative 11/06/2014 1   Final  . Eosinophils Absolute 11/06/2014 0.0  0 - 0.7 K/uL Final  . Basophils Relative 11/06/2014 1   Final  . Basophils Absolute 11/06/2014 0.1  0 - 0.1 K/uL Final  . Sodium 11/06/2014 137  135 - 145 mmol/L Final  . Potassium 11/06/2014 4.2  3.5 - 5.1 mmol/L Final  . Chloride 11/06/2014 102  101 - 111 mmol/L Final  . CO2 11/06/2014 26  22 - 32 mmol/L Final  . Glucose, Bld 11/06/2014 111* 65 - 99 mg/dL Final  . BUN 11/06/2014 14  6 - 20 mg/dL Final  . Creatinine, Ser 11/06/2014 0.69  0.44 - 1.00 mg/dL Final  . Calcium 11/06/2014 9.1  8.9 - 10.3 mg/dL Final  . Total Protein 11/06/2014 6.3* 6.5 - 8.1 g/dL Final  . Albumin 11/06/2014 2.3* 3.5 - 5.0 g/dL Final  . AST 11/06/2014 21  15 - 41 U/L Final  . ALT 11/06/2014 10* 14 - 54 U/L Final  . Alkaline Phosphatase 11/06/2014 174* 38 - 126 U/L Final  . Total Bilirubin 11/06/2014 0.5  0.3 - 1.2 mg/dL Final  . GFR calc non Af Amer 11/06/2014 >60  >60 mL/min Final  . GFR calc Af Amer 11/06/2014 >60  >60 mL/min Final   Comment: (NOTE) The eGFR has been calculated using the CKD EPI equation. This calculation has not been validated in all clinical situations. eGFR's persistently <60 mL/min signify possible Chronic Kidney Disease.   Georgiann Hahn gap 11/06/2014 9  5 - 15 Final    Assessment:  Sandy [name] is a 69 y.o. female with metastatic colon cancer. She presented with a 53 pound weight loss in 6 months. Colonoscopy on 09/07/2014 revealed a circumferential partially obstructive fungating mass in the  descending colon. There was no active bleeding. There was a 1.5 cm pedunculated polyp in the sigmoid colon (tubulovillous adenoma). Pathology from the descending colon mass revealed fragments of adenocarcinoma. CEA was 11,793 on 09/05/2014.  Chest CT angiogram on 08/28/2014 revealed no evidence of pulmonary embolism, but multiple large enhancing liver masses (largest 6.4 cm).  PET scan on 10/04/2014 revealed 6.5 x 6.1 cm hypermetabolic mass in the proximal sigmoid colon,  There were  numerous large hypermetabolic hepatic metastases. There were no additional sites of metastatic disease in the neck, chest or skeleton.  In addition, there was a 3.2 x 2.8 cm low-attenuation right thyroid nodule.   She is status post 2 cycles of FOLFOX chemotherapy (10/09/2014 - 10/23/2014).  She is tolerating her chemotherapy well with minimal nausea, transient diarrhea, and no neuropathy.  CEA was 10,169 on 10/09/2014 and 12,034 on 0712/2016.  Symptomatically, she remains fatigued.  She is eating well on Megace.  She is undecided whether she wishes to pursue Avastin.  Plan: 1.  Labs today:  CBC with diff, CMP, CEA. 2.  Discuss CEA trend. 3.  Discuss patient's thoughts about Avastin- wishes to defer. 4.  Follow-up initial KRAS and BRAF testing.  Consider Her2/neu testing (protocol).  If KRAS wild type then add cetuximab or panitumumab. 5.  Cycle #3 FOLFOX today. 6.  RTC  In 2 days for pump disconnect. 7.  Refill Megace 200 mg po q day.  Advance dose as needed with clinic reassessments. 8.  Rx:  Percocet 5/325 po q 6 hours prn pain, Dis: #20. 9.  Home health care evaluation. 10.  RTC in 2 weeks for MD assess, labs (CBC with diff, CMP, CEA,  Mg), FOLFOX +/- Avastin.   Lequita Asal, MD  11/06/2014, 12:37 PM

## 2014-11-08 ENCOUNTER — Inpatient Hospital Stay: Payer: Medicare Other

## 2014-11-08 DIAGNOSIS — C189 Malignant neoplasm of colon, unspecified: Secondary | ICD-10-CM

## 2014-11-08 DIAGNOSIS — C787 Secondary malignant neoplasm of liver and intrahepatic bile duct: Principal | ICD-10-CM

## 2014-11-08 MED ORDER — HEPARIN SOD (PORK) LOCK FLUSH 100 UNIT/ML IV SOLN
500.0000 [IU] | Freq: Once | INTRAVENOUS | Status: AC | PRN
  Administered 2014-11-08: 500 [IU]
  Filled 2014-11-08: qty 5

## 2014-11-08 MED ORDER — SODIUM CHLORIDE 0.9 % IJ SOLN
10.0000 mL | INTRAMUSCULAR | Status: DC | PRN
  Administered 2014-11-08: 10 mL
  Filled 2014-11-08: qty 10

## 2014-11-19 ENCOUNTER — Encounter: Payer: Self-pay | Admitting: Hematology and Oncology

## 2014-11-20 ENCOUNTER — Inpatient Hospital Stay: Payer: Medicare Other

## 2014-11-20 ENCOUNTER — Telehealth: Payer: Self-pay | Admitting: *Deleted

## 2014-11-20 ENCOUNTER — Inpatient Hospital Stay: Payer: Medicare Other | Attending: Hematology and Oncology | Admitting: Hematology and Oncology

## 2014-11-20 ENCOUNTER — Encounter: Payer: Self-pay | Admitting: Hematology and Oncology

## 2014-11-20 VITALS — BP 113/71 | HR 60 | Temp 97.6°F | Resp 18 | Wt 121.9 lb

## 2014-11-20 DIAGNOSIS — E119 Type 2 diabetes mellitus without complications: Secondary | ICD-10-CM

## 2014-11-20 DIAGNOSIS — G629 Polyneuropathy, unspecified: Secondary | ICD-10-CM

## 2014-11-20 DIAGNOSIS — Z79899 Other long term (current) drug therapy: Secondary | ICD-10-CM | POA: Diagnosis not present

## 2014-11-20 DIAGNOSIS — M129 Arthropathy, unspecified: Secondary | ICD-10-CM | POA: Diagnosis not present

## 2014-11-20 DIAGNOSIS — R634 Abnormal weight loss: Secondary | ICD-10-CM

## 2014-11-20 DIAGNOSIS — R1010 Upper abdominal pain, unspecified: Secondary | ICD-10-CM

## 2014-11-20 DIAGNOSIS — I10 Essential (primary) hypertension: Secondary | ICD-10-CM

## 2014-11-20 DIAGNOSIS — Z7982 Long term (current) use of aspirin: Secondary | ICD-10-CM | POA: Diagnosis not present

## 2014-11-20 DIAGNOSIS — C787 Secondary malignant neoplasm of liver and intrahepatic bile duct: Secondary | ICD-10-CM | POA: Diagnosis not present

## 2014-11-20 DIAGNOSIS — C189 Malignant neoplasm of colon, unspecified: Secondary | ICD-10-CM | POA: Diagnosis not present

## 2014-11-20 DIAGNOSIS — E871 Hypo-osmolality and hyponatremia: Secondary | ICD-10-CM

## 2014-11-20 DIAGNOSIS — Z87891 Personal history of nicotine dependence: Secondary | ICD-10-CM

## 2014-11-20 DIAGNOSIS — Z5111 Encounter for antineoplastic chemotherapy: Secondary | ICD-10-CM | POA: Insufficient documentation

## 2014-11-20 LAB — NM MYOCAR MULTI W/SPECT W/WALL MOTION / EF
LV dias vol: 75 mL
LV sys vol: 45 mL
NUC STRESS TID: 0.88
SDS: 0
SRS: 6
SSS: 3

## 2014-11-20 LAB — CBC WITH DIFFERENTIAL/PLATELET
Basophils Absolute: 0 10*3/uL (ref 0–0.1)
Basophils Relative: 1 %
Eosinophils Absolute: 0.1 10*3/uL (ref 0–0.7)
Eosinophils Relative: 1 %
HCT: 29.3 % — ABNORMAL LOW (ref 35.0–47.0)
Hemoglobin: 9.8 g/dL — ABNORMAL LOW (ref 12.0–16.0)
Lymphocytes Relative: 14 %
Lymphs Abs: 1 10*3/uL (ref 1.0–3.6)
MCH: 27.7 pg (ref 26.0–34.0)
MCHC: 33.5 g/dL (ref 32.0–36.0)
MCV: 82.5 fL (ref 80.0–100.0)
Monocytes Absolute: 0.8 10*3/uL (ref 0.2–0.9)
Monocytes Relative: 10 %
Neutro Abs: 5.4 10*3/uL (ref 1.4–6.5)
Neutrophils Relative %: 74 %
Platelets: 398 10*3/uL (ref 150–440)
RBC: 3.55 MIL/uL — ABNORMAL LOW (ref 3.80–5.20)
RDW: 18.8 % — ABNORMAL HIGH (ref 11.5–14.5)
WBC: 7.4 10*3/uL (ref 3.6–11.0)

## 2014-11-20 LAB — COMPREHENSIVE METABOLIC PANEL
ALT: 10 U/L — ABNORMAL LOW (ref 14–54)
AST: 22 U/L (ref 15–41)
Albumin: 2.3 g/dL — ABNORMAL LOW (ref 3.5–5.0)
Alkaline Phosphatase: 130 U/L — ABNORMAL HIGH (ref 38–126)
Anion gap: 8 (ref 5–15)
BUN: 17 mg/dL (ref 6–20)
CO2: 22 mmol/L (ref 22–32)
Calcium: 8.8 mg/dL — ABNORMAL LOW (ref 8.9–10.3)
Chloride: 101 mmol/L (ref 101–111)
Creatinine, Ser: 0.71 mg/dL (ref 0.44–1.00)
GFR calc Af Amer: >60 mL/min (ref >60)
GFR calc non Af Amer: >60 mL/min (ref >60)
Glucose, Bld: 169 mg/dL — ABNORMAL HIGH (ref 65–99)
Potassium: 4 mmol/L (ref 3.5–5.1)
Sodium: 131 mmol/L — ABNORMAL LOW (ref 135–145)
Total Bilirubin: 0.6 mg/dL (ref 0.3–1.2)
Total Protein: 6.4 g/dL — ABNORMAL LOW (ref 6.5–8.1)

## 2014-11-20 LAB — MAGNESIUM: Magnesium: 1.6 mg/dL — ABNORMAL LOW (ref 1.7–2.4)

## 2014-11-20 MED ORDER — LEUCOVORIN CALCIUM INJECTION 350 MG
650.0000 mg | Freq: Once | INTRAVENOUS | Status: AC
  Administered 2014-11-20: 650 mg via INTRAVENOUS
  Filled 2014-11-20: qty 32.5

## 2014-11-20 MED ORDER — SODIUM CHLORIDE 0.9 % IV SOLN
Freq: Once | INTRAVENOUS | Status: AC
  Administered 2014-11-20: 11:00:00 via INTRAVENOUS
  Filled 2014-11-20: qty 4

## 2014-11-20 MED ORDER — DEXTROSE 5 % IV SOLN
Freq: Once | INTRAVENOUS | Status: AC
  Administered 2014-11-20: 10:00:00 via INTRAVENOUS
  Filled 2014-11-20: qty 1000

## 2014-11-20 MED ORDER — SODIUM CHLORIDE 0.9 % IJ SOLN
10.0000 mL | INTRAMUSCULAR | Status: DC | PRN
  Administered 2014-11-20: 10 mL
  Filled 2014-11-20: qty 10

## 2014-11-20 MED ORDER — OXALIPLATIN CHEMO INJECTION 100 MG/20ML
85.0000 mg/m2 | Freq: Once | INTRAVENOUS | Status: AC
  Administered 2014-11-20: 140 mg via INTRAVENOUS
  Filled 2014-11-20: qty 20

## 2014-11-20 MED ORDER — FLUOROURACIL CHEMO INJECTION 2.5 GM/50ML
400.0000 mg/m2 | Freq: Once | INTRAVENOUS | Status: AC
  Administered 2014-11-20: 650 mg via INTRAVENOUS
  Filled 2014-11-20: qty 13

## 2014-11-20 MED ORDER — SODIUM CHLORIDE 0.9 % IV SOLN
2400.0000 mg/m2 | INTRAVENOUS | Status: DC
  Administered 2014-11-20: 3900 mg via INTRAVENOUS
  Filled 2014-11-20: qty 78

## 2014-11-20 NOTE — Progress Notes (Signed)
Patient here for follow up for colon cancer and treatment. States she felt dizzy this morning and used her walker at home to get around. Patient is in w/c. Denies pain. States she fell last week and hurt her shoulder but it is feeling much better. She saw Dr. Quay Burow yesterday.

## 2014-11-20 NOTE — Telephone Encounter (Signed)
KRAS testing can NOT be done, there was no tumor left in the block 

## 2014-11-20 NOTE — Progress Notes (Signed)
[name] day:  11/20/2014  Chief Complaint: Sandy [name] is a 69 y.o. female with stage IV colon cancer who is seen for assessment prior to cycle #4 FOLFOX chemotherapy.  HPI: The patient was last seen in the medical oncology clinic on 11/06/2014.  At that time,  she was seen for assessment prior to cycle #3 FOLFOX chemotherapy. At that time, she was eating well on Megace. She took Imodium for diarrhea for 2 days. Diarrhea had resolved.  During the interim, she has continued to "feel good".  She is taking 1 tsp of Megace a day.  She is eating well.  Bowel movements are normal and without blood.  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  She had no diarrhea with the last cycle.  She fell without her walker and hit her shoulder.  It was sore for a couple of days.  She managed symptoms with [place] and a heating pad.  Past Medical History  Diagnosis Date  . Hypertension   . Diabetes mellitus without complication   . Arthritis   . Cancer     liver    Past Surgical History  Procedure Laterality Date  . Colonoscopy with propofol N/A 09/07/2014    Procedure: COLONOSCOPY WITH PROPOFOL;  Surgeon: Lucilla Lame, MD;  Location: [facility] ENDOSCOPY;  Service: Endoscopy;  Laterality: N/A;  . Esophagogastroduodenoscopy N/A 09/07/2014    Procedure: ESOPHAGOGASTRODUODENOSCOPY (EGD);  Surgeon: Lucilla Lame, MD;  Location: [facility] ENDOSCOPY;  Service: Endoscopy;  Laterality: N/A;  . Portacath placement Left 09/24/2014    Procedure: INSERTION PORT-A-CATH;  Surgeon: Robert Bellow, MD;  Location: [facility] ORS;  Service: General;  Laterality: Left;  . Liver biopsy      Family History  Problem Relation Age of Onset  . Cancer Sister     Social History:  reports that she quit smoking about 11 years ago. Her smoking use included Cigarettes. She has a 5 pack-year smoking history. She has never used smokeless tobacco. She reports that she does not drink  alcohol or use illicit drugs.  The patient is accompanied by her daughter and 2 other people.  Allergies: No Known Allergies  Current Medications: Current Outpatient Prescriptions  Medication Sig Dispense Refill  . amLODipine (NORVASC) 5 MG tablet Take 5 mg by mouth daily.    Marland Kitchen aspirin EC 81 MG tablet Take 81 mg by mouth daily.    Marland Kitchen atorvastatin (LIPITOR) 40 MG tablet Take 40 mg by mouth daily.    Marland Kitchen glipiZIDE (GLUCOTROL XL) 10 MG 24 hr tablet Take 10 mg by mouth daily with breakfast.    . lidocaine-prilocaine (EMLA) cream Apply 1 application topically as needed. 30 g 1  . lidocaine-prilocaine (EMLA) cream Apply 1 application topically as needed. Apply 1-2 hours prior to chemotherapy 30 g 1  . lisinopril (PRINIVIL,ZESTRIL) 10 MG tablet Take 10 mg by mouth daily.    . megestrol (MEGACE) 400 MG/10ML suspension Take 5 mLs (200 mg total) by mouth daily. to stimulate appetite 240 mL 0  . metFORMIN (GLUCOPHAGE) 1000 MG tablet Take 1,000 mg by mouth 2 (two) times daily.     . metoprolol (TOPROL-XL) 200 MG 24 hr tablet Take 200 mg by mouth daily.    . Multiple Vitamin (MULTIVITAMIN WITH MINERALS) TABS tablet Take 1 tablet by mouth daily.    . ondansetron (ZOFRAN) 4 MG tablet Take 1 tablet (4 mg total) by mouth every 8 (eight) hours as needed for nausea  or vomiting. 30 tablet 2  . oxyCODONE-acetaminophen (PERCOCET/ROXICET) 5-325 MG per tablet Take 1 tablet by mouth every 6 (six) hours as needed for severe pain. 20 tablet 0  . pioglitazone (ACTOS) 30 MG tablet Take 30 mg by mouth daily.    . potassium chloride SA (K-DUR,KLOR-CON) 10 MEQ tablet Take 1 tablet (10 mEq total) by mouth once. Take 1 pill twice a day for 3 days then 1 pill a day. 20 tablet 0  . zolpidem (AMBIEN) 10 MG tablet Take 1 tablet by mouth at bedtime as needed.    Marland Kitchen lisinopril-hydrochlorothiazide (PRINZIDE,ZESTORETIC) 20-25 MG per tablet Take 1 tablet by mouth daily.     No current facility-administered medications for this visit.    Facility-Administered Medications Ordered in Other Visits  Medication Dose Route Frequency Provider Last Rate Last Dose  . acetaminophen (TYLENOL) tablet 650 mg  650 mg Oral Once Lequita Asal, MD   650 mg at 10/19/14 2197  . sodium chloride 0.9 % injection 10 mL  10 mL Intracatheter PRN Lequita Asal, MD   10 mL at 10/11/14 1431    Review of Systems:  GENERAL: Feels good. Weight stable. No fevers or sweats. PERFORMANCE STATUS (ECOG): 3. HEENT: Wears a hearing aide. No visual changes, runny nose, sore throat, mouth sores or tenderness. Lungs: No shortness of breath or cough. No hemoptysis. Cardiac: No chest pain, palpitations, orthopnea, or PND. GI: Upper abdominal pain on/off.No nausea, vomiting, diarrhea, constipation, melena or hematochezia. GU: Wears a Depends pad.  No dysuria or hematuria. Musculoskeletal: [place] and hit shoulder (see HPI).  No back pain. No joint pain. No muscle tenderness. Extremities: No pain or swelling. Skin: No rashes, skin lesions, or ulcers Neuro:  Transient oxaliplatin induced cold neuropathy.  No headache, numbness or weakness, or balance issues. Endocrine: No diabetes, thyroid issues, hot flashes or night sweats. Psych: No mood changes, depression or anxiety. Pain: Upper abdominal pain (minimal). Review of systems: All other systems reviewed and found to be negative.  Physical Exam: Blood pressure 113/71, pulse 60, temperature 97.6 F (36.4 C), temperature source Oral, resp. rate 18, weight 121 lb 14.6 oz (55.3 kg). GENERAL: Chronically ill appearing woman sitting comfortably in a wheelchair under a blanket in the exam room in no acute distress. MENTAL STATUS: Alert and oriented to person, place and time. HEAD: Brown curly hair with faint red highlights. Normocephalic, atraumatic, face symmetric, no Cushingoid features. EYES: Glasses. Hazel/green eyes. Pupils equal round and reactive to light and accomodation. No  conjunctivitis or scleral icterus. ENT: Oropharynx clear without lesion. Partials. Tongue normal. Mucous membranes moist.  RESPIRATORY: Clear to auscultation without rales, wheezes or rhonchi. CARDIOVASCULAR: Regular rate and rhythm without murmur, rub or gallop. ABDOMEN: Soft, non-tender with active bowel sounds.  Liver palpable to 2 fingerbreaths above umbilicus. No guarding or rebound tenderness.  SKIN: No rashes, ulcer or skin lesions.  EXTREMITIES: No edema, no skin discoloration or tenderness. No palpable cords. LYMPH NODES: No palpable cervical, supraclavicular, axillary or inguinal adenopathy  NEUROLOGICAL: Unremarkable. PSYCH: Appropriate.  Office Visit on 11/20/2014  Component Date Value Ref Range Status  . WBC 11/20/2014 7.4  3.6 - 11.0 K/uL Final  . RBC 11/20/2014 3.55* 3.80 - 5.20 MIL/uL Final  . Hemoglobin 11/20/2014 9.8* 12.0 - 16.0 g/dL Final  . HCT 11/20/2014 29.3* 35.0 - 47.0 % Final  . MCV 11/20/2014 82.5  80.0 - 100.0 fL Final  . MCH 11/20/2014 27.7  26.0 - 34.0 pg Final  . MCHC 11/20/2014  33.5  32.0 - 36.0 g/dL Final  . RDW 11/20/2014 18.8* 11.5 - 14.5 % Final  . Platelets 11/20/2014 398  150 - 440 K/uL Final  . Neutrophils Relative % 11/20/2014 74   Final  . Neutro Abs 11/20/2014 5.4  1.4 - 6.5 K/uL Final  . Lymphocytes Relative 11/20/2014 14   Final  . Lymphs Abs 11/20/2014 1.0  1.0 - 3.6 K/uL Final  . Monocytes Relative 11/20/2014 10   Final  . Monocytes Absolute 11/20/2014 0.8  0.2 - 0.9 K/uL Final  . Eosinophils Relative 11/20/2014 1   Final  . Eosinophils Absolute 11/20/2014 0.1  0 - 0.7 K/uL Final  . Basophils Relative 11/20/2014 1   Final  . Basophils Absolute 11/20/2014 0.0  0 - 0.1 K/uL Final  . Sodium 11/20/2014 131* 135 - 145 mmol/L Final  . Potassium 11/20/2014 4.0  3.5 - 5.1 mmol/L Final  . Chloride 11/20/2014 101  101 - 111 mmol/L Final  . CO2 11/20/2014 22  22 - 32 mmol/L Final  . Glucose, Bld 11/20/2014 169* 65 - 99 mg/dL Final  .  BUN 11/20/2014 17  6 - 20 mg/dL Final  . Creatinine, Ser 11/20/2014 0.71  0.44 - 1.00 mg/dL Final  . Calcium 11/20/2014 8.8* 8.9 - 10.3 mg/dL Final  . Total Protein 11/20/2014 6.4* 6.5 - 8.1 g/dL Final  . Albumin 11/20/2014 2.3* 3.5 - 5.0 g/dL Final  . AST 11/20/2014 22  15 - 41 U/L Final  . ALT 11/20/2014 10* 14 - 54 U/L Final  . Alkaline Phosphatase 11/20/2014 130* 38 - 126 U/L Final  . Total Bilirubin 11/20/2014 0.6  0.3 - 1.2 mg/dL Final  . GFR calc non Af Amer 11/20/2014 >60  >60 mL/min Final  . GFR calc Af Amer 11/20/2014 >60  >60 mL/min Final   Comment: (NOTE) The eGFR has been calculated using the CKD EPI equation. This calculation has not been validated in all clinical situations. eGFR's persistently <60 mL/min signify possible Chronic Kidney Disease.   . Anion gap 11/20/2014 8  5 - 15 Final  . Magnesium 11/20/2014 1.6* 1.7 - 2.4 mg/dL Final    Assessment:  Sandy [name] is a 69 y.o. female with metastatic colon cancer. She presented with a 53 pound weight loss in 6 months. Colonoscopy on 09/07/2014 revealed a circumferential partially obstructive fungating mass in the descending colon. There was no active bleeding. There was a 1.5 cm pedunculated polyp in the sigmoid colon (tubulovillous adenoma). Pathology from the descending colon mass revealed fragments of adenocarcinoma. CEA was 11,793 on 09/05/2014.  Chest CT angiogram on 08/28/2014 revealed no evidence of pulmonary embolism, but multiple large enhancing liver masses (largest 6.4 cm).  PET scan on 10/04/2014 revealed 6.5 x 6.1 cm hypermetabolic mass in the proximal sigmoid colon,  There were  numerous large hypermetabolic hepatic metastases. There were no additional sites of metastatic disease in the neck, chest or skeleton.  In addition, there was a 3.2 x 2.8 cm low-attenuation right thyroid nodule.   She is status post 3 cycles of FOLFOX chemotherapy (10/09/2014 - 11/06/2014).  She is tolerating her chemotherapy  well with transient cold neuropathy.  CEA was 10,169 on 10/09/2014 and 12,034 on 0712/2016.  Symptomatically, she remains fatigued.  She is eating well on Megace.  Weight is stable.  She has mild hyponatremia.  She does not wish to pursue Avastin.  Plan: 1.  Labs today:  CBC with diff, CMP, CEA. 2.  Discuss patient's thoughts about  Avastin- declines. 3.  Discuss cetuximab if KRAS wild type- patient agrees.  Side effects reviewed. 4.  Follow-up initial KRAS and BRAF testing- pathology called. 5.  Cycle #4 FOLFOX today. 6.  RTC in 2 days for pump disconnect. 7.  Discuss free water restriction, drinking fluids with electrolytes, and added salt. 8.  RTC in 2 weeks for MD assess, labs (CBC with diff, CMP, CEA, Mg), cycle #5 FOLFOX +/- Erbitux.   Lequita Asal, MD  11/20/2014, 9:21 AM

## 2014-11-21 ENCOUNTER — Telehealth: Payer: Self-pay | Admitting: *Deleted

## 2014-11-21 LAB — CEA: CEA: 7765 ng/mL — ABNORMAL HIGH (ref 0.0–4.7)

## 2014-11-21 NOTE — Telephone Encounter (Signed)
  Sounds like it is hemorrhoidal bleeding.  If she has any significant bleeding to let us know.  She would need to go to ER.  Please let her know that her CEA has improved!!  M

## 2014-11-21 NOTE — Telephone Encounter (Signed)
Spoke with [name] who repeated instructions back to me and cried when given news of CEA improving. She thanked me for calling back

## 2014-11-21 NOTE — Telephone Encounter (Signed)
Called to report pt had bright red blood with BM this morning. Not a lot and she had been constipated yesterday. Asking if anything needs to be done

## 2014-11-22 ENCOUNTER — Inpatient Hospital Stay: Payer: Medicare Other

## 2014-11-22 VITALS — BP 102/76 | HR 78 | Resp 20

## 2014-11-22 DIAGNOSIS — Z5111 Encounter for antineoplastic chemotherapy: Secondary | ICD-10-CM | POA: Diagnosis not present

## 2014-11-22 DIAGNOSIS — C801 Malignant (primary) neoplasm, unspecified: Secondary | ICD-10-CM

## 2014-11-22 MED ORDER — SODIUM CHLORIDE 0.9 % IJ SOLN
10.0000 mL | Freq: Once | INTRAMUSCULAR | Status: AC
  Administered 2014-11-22: 10 mL via INTRAVENOUS
  Filled 2014-11-22: qty 10

## 2014-11-22 MED ORDER — HEPARIN SOD (PORK) LOCK FLUSH 100 UNIT/ML IV SOLN
INTRAVENOUS | Status: AC
  Filled 2014-11-22: qty 5

## 2014-11-22 MED ORDER — HEPARIN SOD (PORK) LOCK FLUSH 100 UNIT/ML IV SOLN
500.0000 [IU] | Freq: Once | INTRAVENOUS | Status: AC
  Administered 2014-11-22: 500 [IU] via INTRAVENOUS

## 2014-11-28 LAB — SURGICAL PATHOLOGY

## 2014-12-04 ENCOUNTER — Inpatient Hospital Stay: Payer: Medicare Other

## 2014-12-04 ENCOUNTER — Inpatient Hospital Stay (HOSPITAL_BASED_OUTPATIENT_CLINIC_OR_DEPARTMENT_OTHER): Payer: Medicare Other | Admitting: Hematology and Oncology

## 2014-12-04 ENCOUNTER — Other Ambulatory Visit: Payer: Self-pay

## 2014-12-04 VITALS — BP 124/81 | HR 63 | Temp 95.8°F | Resp 17 | Ht 65.0 in | Wt 124.0 lb

## 2014-12-04 DIAGNOSIS — Z87891 Personal history of nicotine dependence: Secondary | ICD-10-CM

## 2014-12-04 DIAGNOSIS — C787 Secondary malignant neoplasm of liver and intrahepatic bile duct: Secondary | ICD-10-CM

## 2014-12-04 DIAGNOSIS — G629 Polyneuropathy, unspecified: Secondary | ICD-10-CM

## 2014-12-04 DIAGNOSIS — Z7982 Long term (current) use of aspirin: Secondary | ICD-10-CM

## 2014-12-04 DIAGNOSIS — R1010 Upper abdominal pain, unspecified: Secondary | ICD-10-CM

## 2014-12-04 DIAGNOSIS — E119 Type 2 diabetes mellitus without complications: Secondary | ICD-10-CM

## 2014-12-04 DIAGNOSIS — C189 Malignant neoplasm of colon, unspecified: Secondary | ICD-10-CM

## 2014-12-04 DIAGNOSIS — Z5111 Encounter for antineoplastic chemotherapy: Secondary | ICD-10-CM | POA: Diagnosis not present

## 2014-12-04 DIAGNOSIS — Z79899 Other long term (current) drug therapy: Secondary | ICD-10-CM

## 2014-12-04 DIAGNOSIS — M129 Arthropathy, unspecified: Secondary | ICD-10-CM

## 2014-12-04 DIAGNOSIS — E871 Hypo-osmolality and hyponatremia: Secondary | ICD-10-CM | POA: Diagnosis not present

## 2014-12-04 DIAGNOSIS — I10 Essential (primary) hypertension: Secondary | ICD-10-CM

## 2014-12-04 DIAGNOSIS — R634 Abnormal weight loss: Secondary | ICD-10-CM

## 2014-12-04 LAB — CBC WITH DIFFERENTIAL/PLATELET
Basophils Absolute: 0 10*3/uL (ref 0–0.1)
Basophils Relative: 1 %
Eosinophils Absolute: 0.1 10*3/uL (ref 0–0.7)
Eosinophils Relative: 3 %
HCT: 30 % — ABNORMAL LOW (ref 35.0–47.0)
Hemoglobin: 9.8 g/dL — ABNORMAL LOW (ref 12.0–16.0)
Lymphocytes Relative: 27 %
Lymphs Abs: 1.2 10*3/uL (ref 1.0–3.6)
MCH: 27.5 pg (ref 26.0–34.0)
MCHC: 32.8 g/dL (ref 32.0–36.0)
MCV: 83.9 fL (ref 80.0–100.0)
Monocytes Absolute: 0.5 10*3/uL (ref 0.2–0.9)
Monocytes Relative: 11 %
Neutro Abs: 2.5 10*3/uL (ref 1.4–6.5)
Neutrophils Relative %: 58 %
Platelets: 346 10*3/uL (ref 150–440)
RBC: 3.57 MIL/uL — ABNORMAL LOW (ref 3.80–5.20)
RDW: 19.4 % — ABNORMAL HIGH (ref 11.5–14.5)
WBC: 4.3 10*3/uL (ref 3.6–11.0)

## 2014-12-04 LAB — COMPREHENSIVE METABOLIC PANEL
ALT: 11 U/L — ABNORMAL LOW (ref 14–54)
AST: 17 U/L (ref 15–41)
Albumin: 2.6 g/dL — ABNORMAL LOW (ref 3.5–5.0)
Alkaline Phosphatase: 103 U/L (ref 38–126)
Anion gap: 5 (ref 5–15)
BUN: 11 mg/dL (ref 6–20)
CO2: 25 mmol/L (ref 22–32)
Calcium: 9.2 mg/dL (ref 8.9–10.3)
Chloride: 109 mmol/L (ref 101–111)
Creatinine, Ser: 0.77 mg/dL (ref 0.44–1.00)
GFR calc Af Amer: >60 mL/min (ref >60)
GFR calc non Af Amer: >60 mL/min (ref >60)
Glucose, Bld: 130 mg/dL — ABNORMAL HIGH (ref 65–99)
Potassium: 4.1 mmol/L (ref 3.5–5.1)
Sodium: 139 mmol/L (ref 135–145)
Total Bilirubin: 0.4 mg/dL (ref 0.3–1.2)
Total Protein: 6.4 g/dL — ABNORMAL LOW (ref 6.5–8.1)

## 2014-12-04 LAB — MAGNESIUM: Magnesium: 1.7 mg/dL (ref 1.7–2.4)

## 2014-12-04 MED ORDER — SODIUM CHLORIDE 0.9 % IV SOLN
2400.0000 mg/m2 | INTRAVENOUS | Status: DC
  Administered 2014-12-04: 3900 mg via INTRAVENOUS
  Filled 2014-12-04: qty 78

## 2014-12-04 MED ORDER — DEXTROSE 5 % IV SOLN
85.0000 mg/m2 | Freq: Once | INTRAVENOUS | Status: AC
  Administered 2014-12-04: 140 mg via INTRAVENOUS
  Filled 2014-12-04: qty 20

## 2014-12-04 MED ORDER — FLUOROURACIL CHEMO INJECTION 2.5 GM/50ML
400.0000 mg/m2 | Freq: Once | INTRAVENOUS | Status: AC
  Administered 2014-12-04: 650 mg via INTRAVENOUS
  Filled 2014-12-04: qty 13

## 2014-12-04 MED ORDER — SODIUM CHLORIDE 0.9 % IV SOLN
Freq: Once | INTRAVENOUS | Status: AC
  Administered 2014-12-04: 11:00:00 via INTRAVENOUS
  Filled 2014-12-04: qty 4

## 2014-12-04 MED ORDER — OXYCODONE-ACETAMINOPHEN 5-325 MG PO TABS: 1.0000 | ORAL_TABLET | Freq: Four times a day (QID) | ORAL | Status: DC | PRN

## 2014-12-04 MED ORDER — SODIUM CHLORIDE 0.9 % IJ SOLN
10.0000 mL | INTRAMUSCULAR | Status: DC | PRN
  Administered 2014-12-04: 10 mL
  Filled 2014-12-04: qty 10

## 2014-12-04 MED ORDER — LEUCOVORIN CALCIUM INJECTION 350 MG
650.0000 mg | Freq: Once | INTRAVENOUS | Status: AC
  Administered 2014-12-04: 650 mg via INTRAVENOUS
  Filled 2014-12-04: qty 25

## 2014-12-04 MED ORDER — DEXTROSE 5 % IV SOLN
Freq: Once | INTRAVENOUS | Status: AC
  Administered 2014-12-04: 10:00:00 via INTRAVENOUS
  Filled 2014-12-04: qty 1000

## 2014-12-04 NOTE — Progress Notes (Signed)
No changes but a positive weight gain

## 2014-12-04 NOTE — Progress Notes (Signed)
[facility] day:  12/04/2014  Chief Complaint: Sandy [name] is a 69 y.o. female with stage IV colon cancer who is seen for assessment prior to cycle #5 FOLFOX chemotherapy.  HPI: The patient was last seen in the medical oncology clinic on 11/20/2014.  At that time,  she was seen for assessment prior to cycle #4 FOLFOX chemotherapy. At that time, she felt good.  She was taking her Megace and eating well.  Bowel movements were normal.  She noted a transient cold neuropathy.  She had fallen without her walker and hit her shoulder.  Exam was unchanged.  She declined the addition of Avastin to chemotherapy.  CEA had decreased to 7,765.  During the interim, she has done well.  She states that she feels great.  She has gained 4 pounds.  Past Medical History  Diagnosis Date  . Hypertension   . Diabetes mellitus without complication   . Arthritis   . Cancer     liver    Past Surgical History  Procedure Laterality Date  . Colonoscopy with propofol N/A 09/07/2014    Procedure: COLONOSCOPY WITH PROPOFOL;  Surgeon: Lucilla Lame, MD;  Location: [facility] ENDOSCOPY;  Service: Endoscopy;  Laterality: N/A;  . Esophagogastroduodenoscopy N/A 09/07/2014    Procedure: ESOPHAGOGASTRODUODENOSCOPY (EGD);  Surgeon: Lucilla Lame, MD;  Location: [facility] ENDOSCOPY;  Service: Endoscopy;  Laterality: N/A;  . Portacath placement Left 09/24/2014    Procedure: INSERTION PORT-A-CATH;  Surgeon: Robert Bellow, MD;  Location: [facility] ORS;  Service: General;  Laterality: Left;  . Liver biopsy      Family History  Problem Relation Age of Onset  . Cancer Sister     Social History:  reports that she quit smoking about 11 years ago. Her smoking use included Cigarettes. She has a 5 pack-year smoking history. She has never used smokeless tobacco. She reports that she does not drink alcohol or use illicit drugs.  The patient is accompanied by her son and daughter.  Allergies: No Known  Allergies  Current Medications: Current Outpatient Prescriptions  Medication Sig Dispense Refill  . amLODipine (NORVASC) 5 MG tablet Take 5 mg by mouth daily.    Marland Kitchen aspirin EC 81 MG tablet Take 81 mg by mouth daily.    Marland Kitchen atorvastatin (LIPITOR) 40 MG tablet Take 40 mg by mouth daily.    Marland Kitchen glipiZIDE (GLUCOTROL XL) 10 MG 24 hr tablet Take 10 mg by mouth daily with breakfast.    . lidocaine-prilocaine (EMLA) cream Apply 1 application topically as needed. 30 g 1  . lidocaine-prilocaine (EMLA) cream Apply 1 application topically as needed. Apply 1-2 hours prior to chemotherapy 30 g 1  . lisinopril (PRINIVIL,ZESTRIL) 10 MG tablet Take 10 mg by mouth daily.    Marland Kitchen lisinopril-hydrochlorothiazide (PRINZIDE,ZESTORETIC) 20-25 MG per tablet Take 1 tablet by mouth daily.    . megestrol (MEGACE) 400 MG/10ML suspension Take 5 mLs (200 mg total) by mouth daily. to stimulate appetite 240 mL 0  . metFORMIN (GLUCOPHAGE) 1000 MG tablet Take 1,000 mg by mouth 2 (two) times daily.     . metoprolol (TOPROL-XL) 200 MG 24 hr tablet Take 200 mg by mouth daily.    . Multiple Vitamin (MULTIVITAMIN WITH MINERALS) TABS tablet Take 1 tablet by mouth daily.    . ondansetron (ZOFRAN) 4 MG tablet Take 1 tablet (4 mg total) by mouth every 8 (eight) hours as needed for nausea or vomiting. 30 tablet 2  . oxyCODONE-acetaminophen (  PERCOCET/ROXICET) 5-325 MG per tablet Take 1 tablet by mouth every 6 (six) hours as needed for severe pain. 20 tablet 0  . pioglitazone (ACTOS) 30 MG tablet Take 30 mg by mouth daily.    . potassium chloride SA (K-DUR,KLOR-CON) 10 MEQ tablet Take 1 tablet (10 mEq total) by mouth once. Take 1 pill twice a day for 3 days then 1 pill a day. 20 tablet 0  . zolpidem (AMBIEN) 10 MG tablet Take 1 tablet by mouth at bedtime as needed.     No current facility-administered medications for this visit.   Facility-Administered Medications Ordered in Other Visits  Medication Dose Route Frequency Provider Last Rate Last  Dose  . acetaminophen (TYLENOL) tablet 650 mg  650 mg Oral Once Lequita Asal, MD   650 mg at 10/19/14 1610  . sodium chloride 0.9 % injection 10 mL  10 mL Intracatheter PRN Lequita Asal, MD   10 mL at 10/11/14 1431    Review of Systems:  GENERAL: Feels great. Weight up 4 pounds. No fevers or sweats. PERFORMANCE STATUS (ECOG): 2-3. HEENT: Wears a hearing aide. No visual changes, runny nose, sore throat, mouth sores or tenderness. Lungs: No shortness of breath or cough. No hemoptysis. Cardiac: No chest pain, palpitations, orthopnea, or PND. GI:  Rare upper abdominal pain.No nausea, vomiting, diarrhea, constipation, melena or hematochezia. GU: Wears a Depends pad.  No dysuria or hematuria. Musculoskeletal: FNo back pain. No joint pain. No muscle tenderness. Extremities: No pain or swelling. Skin: No rashes, skin lesions, or ulcers Neuro:  Transient oxaliplatin induced cold neuropathy.  No headache, numbness or weakness, or balance issues. Endocrine: No diabetes, thyroid issues, hot flashes or night sweats. Psych: No mood changes, depression or anxiety. Pain: Upper abdominal pain (minimal). Review of systems: All other systems reviewed and found to be negative.  Physical Exam: There were no vitals taken for this visit. GENERAL: Chronically ill appearing woman sitting comfortably in a wheelchair in the exam room in no acute distress. MENTAL STATUS: Alert and oriented to person, place and time. HEAD: Brown curly hair. Normocephalic, atraumatic, face symmetric, no Cushingoid features. EYES: Glasses. Hazel/green eyes. Pupils equal round and reactive to light and accomodation. No conjunctivitis or scleral icterus. ENT: Oropharynx clear without lesion. Partials. Tongue normal. Mucous membranes moist.  RESPIRATORY: Clear to auscultation without rales, wheezes or rhonchi. CARDIOVASCULAR: Regular rate and rhythm without murmur, rub or gallop. ABDOMEN:  Soft, non-tender with active bowel sounds.  Liver palpable to 2 fingerbreaths above umbilicus. No guarding or rebound tenderness.  SKIN: No rashes, ulcer or skin lesions.  EXTREMITIES: No edema, no skin discoloration or tenderness. No palpable cords. LYMPH NODES: No palpable cervical, supraclavicular, axillary or inguinal adenopathy  NEUROLOGICAL: Unremarkable. PSYCH: Appropriate.  Appointment on 12/04/2014  Component Date Value Ref Range Status  . WBC 12/04/2014 4.3  3.6 - 11.0 K/uL Final  . RBC 12/04/2014 3.57* 3.80 - 5.20 MIL/uL Final  . Hemoglobin 12/04/2014 9.8* 12.0 - 16.0 g/dL Final  . HCT 12/04/2014 30.0* 35.0 - 47.0 % Final  . MCV 12/04/2014 83.9  80.0 - 100.0 fL Final  . MCH 12/04/2014 27.5  26.0 - 34.0 pg Final  . MCHC 12/04/2014 32.8  32.0 - 36.0 g/dL Final  . RDW 12/04/2014 19.4* 11.5 - 14.5 % Final  . Platelets 12/04/2014 346  150 - 440 K/uL Final  . Neutrophils Relative % 12/04/2014 58   Final  . Neutro Abs 12/04/2014 2.5  1.4 - 6.5 K/uL Final  .  Lymphocytes Relative 12/04/2014 27   Final  . Lymphs Abs 12/04/2014 1.2  1.0 - 3.6 K/uL Final  . Monocytes Relative 12/04/2014 11   Final  . Monocytes Absolute 12/04/2014 0.5  0.2 - 0.9 K/uL Final  . Eosinophils Relative 12/04/2014 3   Final  . Eosinophils Absolute 12/04/2014 0.1  0 - 0.7 K/uL Final  . Basophils Relative 12/04/2014 1   Final  . Basophils Absolute 12/04/2014 0.0  0 - 0.1 K/uL Final    Assessment:  CHARNELL PEPLINSKI is a 69 y.o. female with metastatic colon cancer. She presented with a 53 pound weight loss in 6 months. Colonoscopy on 09/07/2014 revealed a circumferential partially obstructive fungating mass in the descending colon. There was no active bleeding. There was a 1.5 cm pedunculated polyp in the sigmoid colon (tubulovillous adenoma). Pathology from the descending colon mass revealed fragments of adenocarcinoma. There was not enough tissue for KRAS testing.  CEA was 11,793 on  09/05/2014.  Chest CT angiogram on 08/28/2014 revealed no evidence of pulmonary embolism, but multiple large enhancing liver masses (largest 6.4 cm).  PET scan on 10/04/2014 revealed 6.5 x 6.1 cm hypermetabolic mass in the proximal sigmoid colon,  There were  numerous large hypermetabolic hepatic metastases. There were no additional sites of metastatic disease in the neck, chest or skeleton.  In addition, there was a 3.2 x 2.8 cm low-attenuation right thyroid nodule.   She is status post 4 cycles of FOLFOX chemotherapy (10/09/2014 - 11/20/2014).  She declined Avastin.  She is tolerating her chemotherapy well with only a transient cold neuropathy.  CEA was 10,169 on 10/09/2014 , 12,034 on 0712/2016, 7765 on 11/20/2014, and 5154 today.  Symptomatically, she feels.  She is eating well and gaining weight on Megace.  CEA is decreasing.  Plan: 1.  Labs today:  CBC with diff, CMP, CEA. 2.  Cycle #5 FOLFOX today. 3.  RTC in 2 days for pump disconnect. 4.  Discuss not enough tissue for KRAS testing.  Discussed consideration of biopsy (liver or colon).  Patient interested in repeat colonoscopy. 5.  Follow-up with Dr. Lucilla Lame regarding feasibility of repeat endoscopy for additional tissue. 6.  RTC in 2 weeks for MD assess, labs (CBC with diff, CMP, CEA, Mg), cycle #6 FOLFOX.   Lequita Asal, MD  12/04/2014, 9:22 AM

## 2014-12-05 LAB — CEA: CEA: 5154 ng/mL — ABNORMAL HIGH (ref 0.0–4.7)

## 2014-12-06 ENCOUNTER — Inpatient Hospital Stay: Payer: Medicare Other

## 2014-12-06 VITALS — BP 124/62 | HR 52 | Temp 97.0°F | Resp 18

## 2014-12-06 DIAGNOSIS — Z5111 Encounter for antineoplastic chemotherapy: Secondary | ICD-10-CM | POA: Diagnosis not present

## 2014-12-06 MED ORDER — HEPARIN SOD (PORK) LOCK FLUSH 100 UNIT/ML IV SOLN
INTRAVENOUS | Status: AC
  Filled 2014-12-06: qty 5

## 2014-12-06 MED ORDER — SODIUM CHLORIDE 0.9 % IJ SOLN
10.0000 mL | INTRAMUSCULAR | Status: DC | PRN
  Administered 2014-12-06: 10 mL via INTRAVENOUS
  Filled 2014-12-06: qty 10

## 2014-12-06 MED ORDER — HEPARIN SOD (PORK) LOCK FLUSH 100 UNIT/ML IV SOLN
500.0000 [IU] | Freq: Once | INTRAVENOUS | Status: AC
  Administered 2014-12-06: 500 [IU] via INTRAVENOUS

## 2014-12-13 ENCOUNTER — Telehealth: Payer: Self-pay | Admitting: *Deleted

## 2014-12-13 MED ORDER — ONDANSETRON HCL 4 MG PO TABS: 4.0000 mg | ORAL_TABLET | Freq: Three times a day (TID) | ORAL | Status: DC | PRN

## 2014-12-13 NOTE — Telephone Encounter (Signed)
E scribed ondansetron

## 2014-12-16 ENCOUNTER — Encounter: Payer: Self-pay | Admitting: Hematology and Oncology

## 2014-12-18 ENCOUNTER — Inpatient Hospital Stay: Payer: Medicare Other | Attending: Hematology and Oncology

## 2014-12-18 ENCOUNTER — Inpatient Hospital Stay (HOSPITAL_BASED_OUTPATIENT_CLINIC_OR_DEPARTMENT_OTHER): Payer: Medicare Other | Admitting: Hematology and Oncology

## 2014-12-18 ENCOUNTER — Inpatient Hospital Stay: Payer: Medicare Other

## 2014-12-18 ENCOUNTER — Encounter: Payer: Self-pay | Admitting: Hematology and Oncology

## 2014-12-18 VITALS — BP 144/86 | HR 68 | Temp 97.8°F | Ht 65.0 in | Wt 127.9 lb

## 2014-12-18 DIAGNOSIS — G629 Polyneuropathy, unspecified: Secondary | ICD-10-CM | POA: Diagnosis not present

## 2014-12-18 DIAGNOSIS — G4700 Insomnia, unspecified: Secondary | ICD-10-CM | POA: Insufficient documentation

## 2014-12-18 DIAGNOSIS — K5900 Constipation, unspecified: Secondary | ICD-10-CM | POA: Insufficient documentation

## 2014-12-18 DIAGNOSIS — I10 Essential (primary) hypertension: Secondary | ICD-10-CM

## 2014-12-18 DIAGNOSIS — Z87891 Personal history of nicotine dependence: Secondary | ICD-10-CM | POA: Insufficient documentation

## 2014-12-18 DIAGNOSIS — E119 Type 2 diabetes mellitus without complications: Secondary | ICD-10-CM | POA: Diagnosis not present

## 2014-12-18 DIAGNOSIS — C787 Secondary malignant neoplasm of liver and intrahepatic bile duct: Secondary | ICD-10-CM

## 2014-12-18 DIAGNOSIS — Z79899 Other long term (current) drug therapy: Secondary | ICD-10-CM | POA: Insufficient documentation

## 2014-12-18 DIAGNOSIS — E041 Nontoxic single thyroid nodule: Secondary | ICD-10-CM | POA: Insufficient documentation

## 2014-12-18 DIAGNOSIS — Z5111 Encounter for antineoplastic chemotherapy: Secondary | ICD-10-CM | POA: Diagnosis not present

## 2014-12-18 DIAGNOSIS — Z7982 Long term (current) use of aspirin: Secondary | ICD-10-CM

## 2014-12-18 DIAGNOSIS — C189 Malignant neoplasm of colon, unspecified: Secondary | ICD-10-CM | POA: Insufficient documentation

## 2014-12-18 DIAGNOSIS — B356 Tinea cruris: Secondary | ICD-10-CM | POA: Diagnosis not present

## 2014-12-18 DIAGNOSIS — M129 Arthropathy, unspecified: Secondary | ICD-10-CM | POA: Insufficient documentation

## 2014-12-18 LAB — COMPREHENSIVE METABOLIC PANEL
ALT: 10 U/L — ABNORMAL LOW (ref 14–54)
AST: 19 U/L (ref 15–41)
Albumin: 2.6 g/dL — ABNORMAL LOW (ref 3.5–5.0)
Alkaline Phosphatase: 87 U/L (ref 38–126)
Anion gap: 6 (ref 5–15)
BUN: 14 mg/dL (ref 6–20)
CO2: 24 mmol/L (ref 22–32)
Calcium: 9.1 mg/dL (ref 8.9–10.3)
Chloride: 107 mmol/L (ref 101–111)
Creatinine, Ser: 0.68 mg/dL (ref 0.44–1.00)
GFR calc Af Amer: >60 mL/min (ref >60)
GFR calc non Af Amer: >60 mL/min (ref >60)
Glucose, Bld: 96 mg/dL (ref 65–99)
Potassium: 4.1 mmol/L (ref 3.5–5.1)
Sodium: 137 mmol/L (ref 135–145)
Total Bilirubin: 0.4 mg/dL (ref 0.3–1.2)
Total Protein: 6.3 g/dL — ABNORMAL LOW (ref 6.5–8.1)

## 2014-12-18 LAB — CBC WITH DIFFERENTIAL/PLATELET
Basophils Absolute: 0 10*3/uL (ref 0–0.1)
Basophils Relative: 1 %
Eosinophils Absolute: 0.1 10*3/uL (ref 0–0.7)
Eosinophils Relative: 2 %
HCT: 28.5 % — ABNORMAL LOW (ref 35.0–47.0)
Hemoglobin: 9.4 g/dL — ABNORMAL LOW (ref 12.0–16.0)
Lymphocytes Relative: 32 %
Lymphs Abs: 1.2 10*3/uL (ref 1.0–3.6)
MCH: 28.1 pg (ref 26.0–34.0)
MCHC: 33 g/dL (ref 32.0–36.0)
MCV: 85.2 fL (ref 80.0–100.0)
Monocytes Absolute: 0.4 10*3/uL (ref 0.2–0.9)
Monocytes Relative: 10 %
Neutro Abs: 2.2 10*3/uL (ref 1.4–6.5)
Neutrophils Relative %: 55 %
Platelets: 367 10*3/uL (ref 150–440)
RBC: 3.35 MIL/uL — ABNORMAL LOW (ref 3.80–5.20)
RDW: 20.7 % — ABNORMAL HIGH (ref 11.5–14.5)
WBC: 3.9 10*3/uL (ref 3.6–11.0)

## 2014-12-18 MED ORDER — DEXTROSE 5 % IV SOLN
Freq: Once | INTRAVENOUS | Status: AC
  Administered 2014-12-18: 11:00:00 via INTRAVENOUS
  Filled 2014-12-18: qty 1000

## 2014-12-18 MED ORDER — SODIUM CHLORIDE 0.9 % IJ SOLN
10.0000 mL | INTRAMUSCULAR | Status: DC | PRN
  Administered 2014-12-18: 10 mL
  Filled 2014-12-18: qty 10

## 2014-12-18 MED ORDER — FLUOROURACIL CHEMO INJECTION 2.5 GM/50ML
400.0000 mg/m2 | Freq: Once | INTRAVENOUS | Status: AC
  Administered 2014-12-18: 650 mg via INTRAVENOUS
  Filled 2014-12-18: qty 13

## 2014-12-18 MED ORDER — SODIUM CHLORIDE 0.9 % IV SOLN
Freq: Once | INTRAVENOUS | Status: AC
  Administered 2014-12-18: 11:00:00 via INTRAVENOUS
  Filled 2014-12-18: qty 4

## 2014-12-18 MED ORDER — HEPARIN SOD (PORK) LOCK FLUSH 100 UNIT/ML IV SOLN: 500.0000 [IU] | Freq: Once | INTRAVENOUS | Status: DC | PRN

## 2014-12-18 MED ORDER — LEUCOVORIN CALCIUM INJECTION 350 MG
650.0000 mg | Freq: Once | INTRAMUSCULAR | Status: AC
  Administered 2014-12-18: 650 mg via INTRAVENOUS
  Filled 2014-12-18: qty 25

## 2014-12-18 MED ORDER — SODIUM CHLORIDE 0.9 % IV SOLN
2400.0000 mg/m2 | INTRAVENOUS | Status: DC
  Administered 2014-12-18: 3900 mg via INTRAVENOUS
  Filled 2014-12-18: qty 78

## 2014-12-18 MED ORDER — OXALIPLATIN CHEMO INJECTION 100 MG/20ML
85.0000 mg/m2 | Freq: Once | INTRAVENOUS | Status: AC
  Administered 2014-12-18: 140 mg via INTRAVENOUS
  Filled 2014-12-18: qty 8

## 2014-12-18 NOTE — Progress Notes (Signed)
Patient patient presents to clinic today for FOLFOX treatment today. She has c/o intermittent constipation, which is treated by an oral stool laxative, prune juice and occasional use of fleets enema. She also complains of insomnia. She takes frequent naps during the day and admits to waking up in the middle of the night.

## 2014-12-18 NOTE — Progress Notes (Signed)
[facility] day:  12/18/2014  Chief Complaint: Sandy [name] is a 69 y.o. female with stage IV colon cancer who is seen for assessment prior to cycle #6 FOLFOX chemotherapy.  HPI: The patient was last seen in the medical oncology clinic on 12/04/2014.  At that time, she was seen for assessment prior to cycle #5 FOLFOX chemotherapy. At that time, she felt great.  She had gained 4 pounds.  We discussed the lack of tissue for K-ras testing. She requested an endoscopy for additional tissue.  She received her chemotherapy uneventfully.  Symptomatically the patient continues to do well. She has a little constipation for which she is using stool softener, fiber, laxiatives and prunes. She continues to note a cold neuropathy when she touches cold water in the refrigerator and gets stiff hands.  Cold liquids cause a zinging sensation in her throat. She has gained an additional 3 pounds. Goal weight is 130-135 pounds.  Past Medical History  Diagnosis Date  . Hypertension   . Diabetes mellitus without complication   . Arthritis   . Cancer     liver mets; colon cancer  . Constipation   . Insomnia     Past Surgical History  Procedure Laterality Date  . Colonoscopy with propofol N/A 09/07/2014    Procedure: COLONOSCOPY WITH PROPOFOL;  Surgeon: Lucilla Lame, MD;  Location: [facility] ENDOSCOPY;  Service: Endoscopy;  Laterality: N/A;  . Esophagogastroduodenoscopy N/A 09/07/2014    Procedure: ESOPHAGOGASTRODUODENOSCOPY (EGD);  Surgeon: Lucilla Lame, MD;  Location: [facility] ENDOSCOPY;  Service: Endoscopy;  Laterality: N/A;  . Portacath placement Left 09/24/2014    Procedure: INSERTION PORT-A-CATH;  Surgeon: Robert Bellow, MD;  Location: [facility] ORS;  Service: General;  Laterality: Left;  . Liver biopsy      Family History  Problem Relation Age of Onset  . Cancer Sister     Social History:  reports that she quit smoking about 11 years ago. Her smoking use  included Cigarettes. She has a 5 pack-year smoking history. She has never used smokeless tobacco. She reports that she does not drink alcohol or use illicit drugs.  The patient is accompanied by her son and daughter.  Allergies: No Known Allergies  Current Medications: Current Outpatient Prescriptions  Medication Sig Dispense Refill  . amLODipine (NORVASC) 5 MG tablet Take 5 mg by mouth daily.    Marland Kitchen aspirin EC 81 MG tablet Take 81 mg by mouth daily.    Marland Kitchen atorvastatin (LIPITOR) 40 MG tablet Take 40 mg by mouth daily.    Marland Kitchen lidocaine-prilocaine (EMLA) cream Apply 1 application topically as needed. 30 g 1  . lisinopril-hydrochlorothiazide (PRINZIDE,ZESTORETIC) 20-25 MG per tablet Take 1 tablet by mouth daily.    . megestrol (MEGACE) 400 MG/10ML suspension Take 5 mLs (200 mg total) by mouth daily. to stimulate appetite 240 mL 0  . metFORMIN (GLUCOPHAGE) 1000 MG tablet Take 1,000 mg by mouth 2 (two) times daily.     . Multiple Vitamin (MULTIVITAMIN WITH MINERALS) TABS tablet Take 1 tablet by mouth daily.    . ondansetron (ZOFRAN) 4 MG tablet Take 1 tablet (4 mg total) by mouth every 8 (eight) hours as needed for nausea or vomiting. 30 tablet 2  . oxyCODONE-acetaminophen (PERCOCET/ROXICET) 5-325 MG per tablet Take 1 tablet by mouth every 6 (six) hours as needed for severe pain. 20 tablet 0  . potassium chloride SA (K-DUR,KLOR-CON) 10 MEQ tablet Take 1 tablet (10 mEq total) by mouth once.  Take 1 pill twice a day for 3 days then 1 pill a day. 20 tablet 0  . Sennosides-Docusate Sodium (STOOL SOFTENER LAXATIVE PO) Take 1 tablet by mouth daily as needed (constipation).    Marland Kitchen zolpidem (AMBIEN) 10 MG tablet Take 1 tablet by mouth at bedtime as needed.     No current facility-administered medications for this visit.   Facility-Administered Medications Ordered in Other Visits  Medication Dose Route Frequency Provider Last Rate Last Dose  . acetaminophen (TYLENOL) tablet 650 mg  650 mg Oral Once Lequita Asal, MD   650 mg at 10/19/14 9458  . sodium chloride 0.9 % injection 10 mL  10 mL Intracatheter PRN Lequita Asal, MD   10 mL at 10/11/14 1431    Review of Systems:  GENERAL: Feels great. Weight up 3 pounds. No fevers or sweats. PERFORMANCE STATUS (ECOG): 2. HEENT: Wears a hearing aide. No visual changes, runny nose, sore throat, mouth sores or tenderness. Lungs: No shortness of breath or cough. No hemoptysis. Cardiac: No chest pain, palpitations, orthopnea, or PND. GI:  No nausea, vomiting, diarrhea, constipation, melena or hematochezia. GU: Wears a Depends pad.  No dysuria or hematuria. Musculoskeletal: No back pain. No joint pain. No muscle tenderness. Extremities: No pain or swelling. Skin: No rashes, skin lesions, or ulcers Neuro:  Transient oxaliplatin induced cold neuropathy.  No headache, numbness or weakness, or balance issues. Endocrine: No diabetes, thyroid issues, hot flashes or night sweats. Psych: No mood changes, depression or anxiety. Pain: No pain. Review of systems: All other systems reviewed and found to be negative.  Physical Exam: Blood pressure 144/86, pulse 68, temperature 97.8 F (36.6 C), temperature source Tympanic, height _0  (1.651 m), weight 127 lb 13.9 oz (58.001 kg). GENERAL: Elderly woman sitting comfortably in a wheelchair in the exam room in no acute distress. MENTAL STATUS: Alert and oriented to person, place and time. HEAD: Styled hair. Normocephalic, atraumatic, face symmetric, no Cushingoid features. EYES: Glasses. Hazel/green eyes. Pupils equal round and reactive to light and accomodation. No conjunctivitis or scleral icterus. ENT: Oropharynx clear without lesion. Partial. Tongue normal. Mucous membranes moist.  RESPIRATORY: Clear to auscultation without rales, wheezes or rhonchi. CARDIOVASCULAR: Regular rate and rhythm without murmur, rub or gallop. ABDOMEN: Soft, non-tender with active bowel sounds.   Liver palpable to 2 fingerbreaths above umbilicus. No guarding or rebound tenderness.  SKIN: No rashes, ulcer or skin lesions.  EXTREMITIES: No edema, no skin discoloration or tenderness. No palpable cords. LYMPH NODES: Right lower neck fullness.  No palpable cervical, supraclavicular, axillary or inguinal adenopathy  NEUROLOGICAL: Unremarkable. PSYCH: Appropriate.  Appointment on 12/18/2014  Component Date Value Ref Range Status  . WBC 12/18/2014 3.9  3.6 - 11.0 K/uL Final  . RBC 12/18/2014 3.35* 3.80 - 5.20 MIL/uL Final  . Hemoglobin 12/18/2014 9.4* 12.0 - 16.0 g/dL Final  . HCT 12/18/2014 28.5* 35.0 - 47.0 % Final  . MCV 12/18/2014 85.2  80.0 - 100.0 fL Final  . MCH 12/18/2014 28.1  26.0 - 34.0 pg Final  . MCHC 12/18/2014 33.0  32.0 - 36.0 g/dL Final  . RDW 12/18/2014 20.7* 11.5 - 14.5 % Final  . Platelets 12/18/2014 367  150 - 440 K/uL Final  . Neutrophils Relative % 12/18/2014 55   Final  . Neutro Abs 12/18/2014 2.2  1.4 - 6.5 K/uL Final  . Lymphocytes Relative 12/18/2014 32   Final  . Lymphs Abs 12/18/2014 1.2  1.0 - 3.6 K/uL Final  .  Monocytes Relative 12/18/2014 10   Final  . Monocytes Absolute 12/18/2014 0.4  0.2 - 0.9 K/uL Final  . Eosinophils Relative 12/18/2014 2   Final  . Eosinophils Absolute 12/18/2014 0.1  0 - 0.7 K/uL Final  . Basophils Relative 12/18/2014 1   Final  . Basophils Absolute 12/18/2014 0.0  0 - 0.1 K/uL Final  . Sodium 12/18/2014 137  135 - 145 mmol/L Final  . Potassium 12/18/2014 4.1  3.5 - 5.1 mmol/L Final  . Chloride 12/18/2014 107  101 - 111 mmol/L Final  . CO2 12/18/2014 24  22 - 32 mmol/L Final  . Glucose, Bld 12/18/2014 96  65 - 99 mg/dL Final  . BUN 12/18/2014 14  6 - 20 mg/dL Final  . Creatinine, Ser 12/18/2014 0.68  0.44 - 1.00 mg/dL Final  . Calcium 12/18/2014 9.1  8.9 - 10.3 mg/dL Final  . Total Protein 12/18/2014 6.3* 6.5 - 8.1 g/dL Final  . Albumin 12/18/2014 2.6* 3.5 - 5.0 g/dL Final  . AST 12/18/2014 19  15 - 41 U/L Final  .  ALT 12/18/2014 10* 14 - 54 U/L Final  . Alkaline Phosphatase 12/18/2014 87  38 - 126 U/L Final  . Total Bilirubin 12/18/2014 0.4  0.3 - 1.2 mg/dL Final  . GFR calc non Af Amer 12/18/2014 >60  >60 mL/min Final  . GFR calc Af Amer 12/18/2014 >60  >60 mL/min Final   Comment: (NOTE) The eGFR has been calculated using the CKD EPI equation. This calculation has not been validated in all clinical situations. eGFR's persistently <60 mL/min signify possible Chronic Kidney Disease.   Georgiann Hahn gap 12/18/2014 6  5 - 15 Final    Assessment:  VEEDA VIRGO is a 69 y.o. female with metastatic colon cancer. She presented with a 53 pound weight loss in 6 months. Colonoscopy on 09/07/2014 revealed a circumferential partially obstructive fungating mass in the descending colon. There was no active bleeding. There was a 1.5 cm pedunculated polyp in the sigmoid colon (tubulovillous adenoma). Pathology from the descending colon mass revealed fragments of adenocarcinoma. There was not enough tissue for KRAS testing.  CEA was 11,793 on 09/05/2014.  Chest CT angiogram on 08/28/2014 revealed no evidence of pulmonary embolism, but multiple large enhancing liver masses (largest 6.4 cm).  PET scan on 10/04/2014 revealed 6.5 x 6.1 cm hypermetabolic mass in the proximal sigmoid colon,  There were  numerous large hypermetabolic hepatic metastases. There were no additional sites of metastatic disease in the neck, chest or skeleton.  In addition, there was a 3.2 x 2.8 cm low-attenuation right thyroid nodule.   She is status post 5 cycles of FOLFOX chemotherapy (10/09/2014 - 12/04/2014).  She declined Avastin.  She is tolerating her chemotherapy well with only a transient cold neuropathy.  CEA was 10,169 on 10/09/2014 , 12,034 on 0712/2016, 7765 on 11/20/2014, and 5154 on 12/04/2014.  Symptomatically, she feels good.  She is eating well and gaining weight on Megace.  She has some constipation.  CEA is  decreasing.  Plan: 1.  Labs today:  CBC with diff, CMP. 2.  Cycle #6 FOLFOX today. 3.  RTC in 2 days for pump disconnect. 4.  Discuss follow-up with Dr. Allen Norris for endoscopy to obtain additional tissue for KRAS testing. 5.  Schedule abdominal/pelvic CT scan on 12/28/2014 for restaging. 6.  RTC in 2 weeks for MD assess, labs (CBC with diff, CMP, CEA, Mg), review of studies, cycle #7 FOLFOX.   Lequita Asal, MD  12/18/2014, 10:12 AM

## 2014-12-19 ENCOUNTER — Ambulatory Visit: Payer: Self-pay | Admitting: Pharmacist

## 2014-12-19 MED ORDER — HEPARIN SOD (PORK) LOCK FLUSH 100 UNIT/ML IV SOLN
INTRAVENOUS | Status: AC
  Filled 2014-12-19: qty 5

## 2014-12-19 NOTE — Patient Instructions (Signed)
Patient arrived in clinic because 5FU pump had stopped running. Pump changed out with # [id number], and patient instructed to return to clinic as previously scheduled for pump removal. Malfunctioning pump removed and placed in area for pickup from infusystem.

## 2014-12-20 ENCOUNTER — Inpatient Hospital Stay: Payer: Medicare Other

## 2014-12-20 DIAGNOSIS — C787 Secondary malignant neoplasm of liver and intrahepatic bile duct: Principal | ICD-10-CM

## 2014-12-20 DIAGNOSIS — Z5111 Encounter for antineoplastic chemotherapy: Secondary | ICD-10-CM | POA: Diagnosis not present

## 2014-12-20 DIAGNOSIS — C189 Malignant neoplasm of colon, unspecified: Secondary | ICD-10-CM

## 2014-12-20 MED ORDER — SODIUM CHLORIDE 0.9 % IJ SOLN
10.0000 mL | INTRAMUSCULAR | Status: DC | PRN
  Administered 2014-12-20: 10 mL
  Filled 2014-12-20: qty 10

## 2014-12-20 MED ORDER — HEPARIN SOD (PORK) LOCK FLUSH 100 UNIT/ML IV SOLN
500.0000 [IU] | Freq: Once | INTRAVENOUS | Status: AC | PRN
  Administered 2014-12-20: 500 [IU]
  Filled 2014-12-20: qty 5

## 2014-12-23 ENCOUNTER — Encounter: Payer: Self-pay | Admitting: Hematology and Oncology

## 2014-12-28 ENCOUNTER — Ambulatory Visit
Admission: RE | Admit: 2014-12-28 | Discharge: 2014-12-28 | Disposition: A | Discharge status: Home / Self Care | Payer: Medicare Other | Source: Ambulatory Visit | Attending: Hematology and Oncology | Admitting: Hematology and Oncology

## 2014-12-28 DIAGNOSIS — C787 Secondary malignant neoplasm of liver and intrahepatic bile duct: Secondary | ICD-10-CM | POA: Diagnosis present

## 2014-12-28 DIAGNOSIS — C189 Malignant neoplasm of colon, unspecified: Secondary | ICD-10-CM | POA: Diagnosis present

## 2014-12-28 DIAGNOSIS — D259 Leiomyoma of uterus, unspecified: Secondary | ICD-10-CM | POA: Insufficient documentation

## 2014-12-28 MED ORDER — IOHEXOL 300 MG/ML  SOLN
100.0000 mL | Freq: Once | INTRAMUSCULAR | Status: AC | PRN
  Administered 2014-12-28: 100 mL via INTRAVENOUS

## 2015-01-01 ENCOUNTER — Other Ambulatory Visit: Payer: Self-pay

## 2015-01-01 ENCOUNTER — Inpatient Hospital Stay (HOSPITAL_BASED_OUTPATIENT_CLINIC_OR_DEPARTMENT_OTHER): Payer: Medicare Other | Admitting: Hematology and Oncology

## 2015-01-01 ENCOUNTER — Encounter: Payer: Self-pay | Admitting: Hematology and Oncology

## 2015-01-01 ENCOUNTER — Inpatient Hospital Stay: Payer: Medicare Other

## 2015-01-01 VITALS — BP 136/84 | HR 58 | Temp 98.1°F | Resp 18 | Wt 128.1 lb

## 2015-01-01 DIAGNOSIS — E041 Nontoxic single thyroid nodule: Secondary | ICD-10-CM

## 2015-01-01 DIAGNOSIS — C787 Secondary malignant neoplasm of liver and intrahepatic bile duct: Principal | ICD-10-CM

## 2015-01-01 DIAGNOSIS — B356 Tinea cruris: Secondary | ICD-10-CM | POA: Diagnosis not present

## 2015-01-01 DIAGNOSIS — G629 Polyneuropathy, unspecified: Secondary | ICD-10-CM

## 2015-01-01 DIAGNOSIS — Z79899 Other long term (current) drug therapy: Secondary | ICD-10-CM

## 2015-01-01 DIAGNOSIS — M129 Arthropathy, unspecified: Secondary | ICD-10-CM

## 2015-01-01 DIAGNOSIS — C189 Malignant neoplasm of colon, unspecified: Secondary | ICD-10-CM

## 2015-01-01 DIAGNOSIS — E119 Type 2 diabetes mellitus without complications: Secondary | ICD-10-CM

## 2015-01-01 DIAGNOSIS — G4700 Insomnia, unspecified: Secondary | ICD-10-CM

## 2015-01-01 DIAGNOSIS — Z5111 Encounter for antineoplastic chemotherapy: Secondary | ICD-10-CM | POA: Diagnosis not present

## 2015-01-01 DIAGNOSIS — Z87891 Personal history of nicotine dependence: Secondary | ICD-10-CM

## 2015-01-01 DIAGNOSIS — K5900 Constipation, unspecified: Secondary | ICD-10-CM

## 2015-01-01 DIAGNOSIS — Z7982 Long term (current) use of aspirin: Secondary | ICD-10-CM

## 2015-01-01 DIAGNOSIS — I10 Essential (primary) hypertension: Secondary | ICD-10-CM

## 2015-01-01 DIAGNOSIS — G893 Neoplasm related pain (acute) (chronic): Secondary | ICD-10-CM

## 2015-01-01 LAB — CBC WITH DIFFERENTIAL/PLATELET
Basophils Absolute: 0.1 10*3/uL (ref 0–0.1)
Basophils Relative: 1 %
Eosinophils Absolute: 0.1 10*3/uL (ref 0–0.7)
Eosinophils Relative: 2 %
HCT: 29.7 % — ABNORMAL LOW (ref 35.0–47.0)
Hemoglobin: 9.7 g/dL — ABNORMAL LOW (ref 12.0–16.0)
Lymphocytes Relative: 22 %
Lymphs Abs: 1.4 10*3/uL (ref 1.0–3.6)
MCH: 28.3 pg (ref 26.0–34.0)
MCHC: 32.5 g/dL (ref 32.0–36.0)
MCV: 87.1 fL (ref 80.0–100.0)
Monocytes Absolute: 0.7 10*3/uL (ref 0.2–0.9)
Monocytes Relative: 11 %
Neutro Abs: 4.2 10*3/uL (ref 1.4–6.5)
Neutrophils Relative %: 64 %
Platelets: 330 10*3/uL (ref 150–440)
RBC: 3.41 MIL/uL — ABNORMAL LOW (ref 3.80–5.20)
RDW: 20.4 % — ABNORMAL HIGH (ref 11.5–14.5)
WBC: 6.4 10*3/uL (ref 3.6–11.0)

## 2015-01-01 LAB — COMPREHENSIVE METABOLIC PANEL
ALT: 10 U/L — ABNORMAL LOW (ref 14–54)
AST: 20 U/L (ref 15–41)
Albumin: 2.7 g/dL — ABNORMAL LOW (ref 3.5–5.0)
Alkaline Phosphatase: 91 U/L (ref 38–126)
Anion gap: 6 (ref 5–15)
BUN: 10 mg/dL (ref 6–20)
CO2: 23 mmol/L (ref 22–32)
Calcium: 9.7 mg/dL (ref 8.9–10.3)
Chloride: 107 mmol/L (ref 101–111)
Creatinine, Ser: 0.72 mg/dL (ref 0.44–1.00)
GFR calc Af Amer: >60 mL/min (ref >60)
GFR calc non Af Amer: >60 mL/min (ref >60)
Glucose, Bld: 107 mg/dL — ABNORMAL HIGH (ref 65–99)
Potassium: 3.9 mmol/L (ref 3.5–5.1)
Sodium: 136 mmol/L (ref 135–145)
Total Bilirubin: 0.6 mg/dL (ref 0.3–1.2)
Total Protein: 6.9 g/dL (ref 6.5–8.1)

## 2015-01-01 LAB — MAGNESIUM: Magnesium: 2 mg/dL (ref 1.7–2.4)

## 2015-01-01 MED ORDER — LEUCOVORIN CALCIUM INJECTION 350 MG
650.0000 mg | Freq: Once | INTRAVENOUS | Status: AC
  Administered 2015-01-01: 650 mg via INTRAVENOUS
  Filled 2015-01-01: qty 25

## 2015-01-01 MED ORDER — HEPARIN SOD (PORK) LOCK FLUSH 100 UNIT/ML IV SOLN: 500.0000 [IU] | Freq: Once | INTRAVENOUS | Status: DC | PRN

## 2015-01-01 MED ORDER — SODIUM CHLORIDE 0.9 % IJ SOLN
10.0000 mL | INTRAMUSCULAR | Status: DC | PRN
  Administered 2015-01-01: 10 mL
  Filled 2015-01-01: qty 10

## 2015-01-01 MED ORDER — SODIUM CHLORIDE 0.9 % IV SOLN
Freq: Once | INTRAVENOUS | Status: AC
  Administered 2015-01-01: 11:00:00 via INTRAVENOUS
  Filled 2015-01-01: qty 4

## 2015-01-01 MED ORDER — SODIUM CHLORIDE 0.9 % IV SOLN
2400.0000 mg/m2 | INTRAVENOUS | Status: DC
  Administered 2015-01-01: 3900 mg via INTRAVENOUS
  Filled 2015-01-01: qty 78

## 2015-01-01 MED ORDER — DEXTROSE 5 % IV SOLN
Freq: Once | INTRAVENOUS | Status: AC
  Administered 2015-01-01: 11:00:00 via INTRAVENOUS
  Filled 2015-01-01: qty 1000

## 2015-01-01 MED ORDER — OXYCODONE-ACETAMINOPHEN 5-325 MG PO TABS: 1.0000 | ORAL_TABLET | Freq: Four times a day (QID) | ORAL | Status: DC | PRN

## 2015-01-01 MED ORDER — FLUOROURACIL CHEMO INJECTION 2.5 GM/50ML
400.0000 mg/m2 | Freq: Once | INTRAVENOUS | Status: AC
  Administered 2015-01-01: 650 mg via INTRAVENOUS
  Filled 2015-01-01: qty 13

## 2015-01-01 MED ORDER — OXALIPLATIN CHEMO INJECTION 100 MG/20ML
85.0000 mg/m2 | Freq: Once | INTRAVENOUS | Status: AC
  Administered 2015-01-01: 140 mg via INTRAVENOUS
  Filled 2015-01-01: qty 20

## 2015-01-01 NOTE — Progress Notes (Signed)
   01/01/15 1030  Clinical Encounter Type  Visited With Patient and family together  Visit Type Initial  Provided pastoral presence and support to patient and family member in the cancer center.  [place] [phone]

## 2015-01-01 NOTE — Progress Notes (Signed)
[facility] day:  01/01/2015  Chief Complaint: Sandy [name] is a 69 y.o. female with stage IV colon cancer who is seen for assessment prior to cycle #7 FOLFOX chemotherapy.  HPI: The patient was last seen in the medical oncology clinic on 12/18/2014.  At that time, she was seen for assessment prior to cycle #6 FOLFOX chemotherapy. Symptomatically , she was doing well.  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 She has gained an additional 3 pounds.  She received her chemotherapy uneventfully.  Per prior plan, she underwent abdominal and pelvic CT scan on 12/28/2014.  Imaging revealed response to therapy.  Liver metastases were smaller.  Index lesion within segment 8 had decreased from 5.8 x 6.4 cm to 3.6 x 4.4 cm.  A lesion in segment 7 had decreased from 5.5 x 5.1 to 3.8 x 3.6 cm.  An anterior left hepatic lobe lesion had decreased from 3.3 cm to 1.8 cm.  There was decreased size of the descending/sigmoid mass (3.3 x 3.3 cm compared to 6.5 x 6.1 cm), without obstruction.  There was no new disease.  During the interim, she has continued to do well.  She states that she feels great.  Weight is stable.  She notes a transient cold neuropathy.  She has constipation.  She had some back pain last week.  Yesterday was her last session with physical therapy.  She has a groin rash.  Past Medical History  Diagnosis Date  . Hypertension   . Arthritis   . Constipation   . Insomnia   . Diabetes mellitus without complication   . Cancer     liver mets; colon cancer    Past Surgical History  Procedure Laterality Date  . Colonoscopy with propofol N/A 09/07/2014    Procedure: COLONOSCOPY WITH PROPOFOL;  Surgeon: Lucilla Lame, MD;  Location: [facility] ENDOSCOPY;  Service: Endoscopy;  Laterality: N/A;  . Esophagogastroduodenoscopy N/A 09/07/2014    Procedure: ESOPHAGOGASTRODUODENOSCOPY (EGD);  Surgeon: Lucilla Lame, MD;  Location: [facility] ENDOSCOPY;  Service: Endoscopy;   Laterality: N/A;  . Portacath placement Left 09/24/2014    Procedure: INSERTION PORT-A-CATH;  Surgeon: Robert Bellow, MD;  Location: [facility] ORS;  Service: General;  Laterality: Left;  . Liver biopsy      Family History  Problem Relation Age of Onset  . Cancer Sister     Social History:  reports that she quit smoking about 11 years ago. Her smoking use included Cigarettes. She has a 5 pack-year smoking history. She has never used smokeless tobacco. She reports that she does not drink alcohol or use illicit drugs.  The patient is accompanied by her daughter.  Allergies: No Known Allergies  Current Medications: Current Outpatient Prescriptions  Medication Sig Dispense Refill  . amLODipine (NORVASC) 5 MG tablet Take 5 mg by mouth daily.    Marland Kitchen aspirin EC 81 MG tablet Take 81 mg by mouth daily.    Marland Kitchen atorvastatin (LIPITOR) 40 MG tablet Take 40 mg by mouth daily.    Marland Kitchen lidocaine-prilocaine (EMLA) cream Apply 1 application topically as needed. 30 g 1  . lisinopril-hydrochlorothiazide (PRINZIDE,ZESTORETIC) 20-25 MG per tablet Take 1 tablet by mouth daily.    . megestrol (MEGACE) 400 MG/10ML suspension Take 5 mLs (200 mg total) by mouth daily. to stimulate appetite 240 mL 0  . metFORMIN (GLUCOPHAGE) 1000 MG tablet Take 1,000 mg by mouth 2 (two) times daily.     . Multiple Vitamin (MULTIVITAMIN WITH MINERALS)  TABS tablet Take 1 tablet by mouth daily.    . ondansetron (ZOFRAN) 4 MG tablet Take 1 tablet (4 mg total) by mouth every 8 (eight) hours as needed for nausea or vomiting. 30 tablet 2  . oxyCODONE-acetaminophen (PERCOCET/ROXICET) 5-325 MG per tablet Take 1 tablet by mouth every 6 (six) hours as needed for severe pain. 20 tablet 0  . potassium chloride SA (K-DUR,KLOR-CON) 10 MEQ tablet Take 1 tablet (10 mEq total) by mouth once. Take 1 pill twice a day for 3 days then 1 pill a day. 20 tablet 0  . Sennosides-Docusate Sodium (STOOL SOFTENER LAXATIVE PO) Take 1 tablet by mouth daily as needed  (constipation).    Marland Kitchen zolpidem (AMBIEN) 10 MG tablet Take 1 tablet by mouth at bedtime as needed.     No current facility-administered medications for this visit.   Facility-Administered Medications Ordered in Other Visits  Medication Dose Route Frequency Provider Last Rate Last Dose  . acetaminophen (TYLENOL) tablet 650 mg  650 mg Oral Once Lequita Asal, MD   650 mg at 10/19/14 4097  . sodium chloride 0.9 % injection 10 mL  10 mL Intracatheter PRN Lequita Asal, MD   10 mL at 10/11/14 1431    Review of Systems:  GENERAL: Feels great. No fevers or sweats.  Weight stable. PERFORMANCE STATUS (ECOG): 2. HEENT: Wears a hearing aide. No visual changes, runny nose, sore throat, mouth sores or tenderness. Lungs: No shortness of breath or cough. No hemoptysis. Cardiac: No chest pain, palpitations, orthopnea, or PND. GI:  Constipation.  No nausea, vomiting, diarrhea, melena or hematochezia. GU: Wears a Depends pad.  No dysuria or hematuria. Musculoskeletal: Transient back pain. No joint pain. No muscle tenderness. Extremities: No pain or swelling. Skin: No rashes, skin lesions, or ulcers Neuro:  Transient oxaliplatin induced cold neuropathy.  No headache, numbness or weakness, or balance issues. Endocrine: No diabetes, thyroid issues, hot flashes or night sweats. Psych: No mood changes, depression or anxiety. Pain: No pain. Review of systems: All other systems reviewed and found to be negative.  Physical Exam: Blood pressure 136/84, pulse 58, temperature 98.1 F (36.7 C), temperature source Oral, resp. rate 18, weight 128 lb 1.4 oz (58.1 kg). GENERAL: Elderly woman sitting comfortably in a wheelchair under a blanket in the exam room in no acute distress. MENTAL STATUS: Alert and oriented to person, place and time. HEAD: Styled hair. Normocephalic, atraumatic, face symmetric, no Cushingoid features. EYES: Glasses. Hazel/green eyes. Pupils equal round and  reactive to light and accomodation. No conjunctivitis or scleral icterus. ENT: Oropharynx clear without lesion. Partial. Tongue normal. Mucous membranes moist.  RESPIRATORY: Clear to auscultation without rales, wheezes or rhonchi. CARDIOVASCULAR: Regular rate and rhythm without murmur, rub or gallop. ABDOMEN: Soft, non-tender with active bowel sounds.  No guarding or rebound tenderness. No hepatosplenomegaly.  No masses. SKIN: Fungal groin rash (left > right).  No ulcer or skin lesions.  EXTREMITIES: No edema, no skin discoloration or tenderness. No palpable cords. LYMPH NODES: No palpable cervical, supraclavicular, axillary or inguinal adenopathy  NEUROLOGICAL: Unremarkable. PSYCH: Appropriate.  Appointment on 01/01/2015  Component Date Value Ref Range Status  . WBC 01/01/2015 6.4  3.6 - 11.0 K/uL Final  . RBC 01/01/2015 3.41* 3.80 - 5.20 MIL/uL Final  . Hemoglobin 01/01/2015 9.7* 12.0 - 16.0 g/dL Final  . HCT 01/01/2015 29.7* 35.0 - 47.0 % Final  . MCV 01/01/2015 87.1  80.0 - 100.0 fL Final  . MCH 01/01/2015 28.3  26.0 -  34.0 pg Final  . MCHC 01/01/2015 32.5  32.0 - 36.0 g/dL Final  . RDW 01/01/2015 20.4* 11.5 - 14.5 % Final  . Platelets 01/01/2015 330  150 - 440 K/uL Final  . Neutrophils Relative % 01/01/2015 64   Final  . Neutro Abs 01/01/2015 4.2  1.4 - 6.5 K/uL Final  . Lymphocytes Relative 01/01/2015 22   Final  . Lymphs Abs 01/01/2015 1.4  1.0 - 3.6 K/uL Final  . Monocytes Relative 01/01/2015 11   Final  . Monocytes Absolute 01/01/2015 0.7  0.2 - 0.9 K/uL Final  . Eosinophils Relative 01/01/2015 2   Final  . Eosinophils Absolute 01/01/2015 0.1  0 - 0.7 K/uL Final  . Basophils Relative 01/01/2015 1   Final  . Basophils Absolute 01/01/2015 0.1  0 - 0.1 K/uL Final  . Sodium 01/01/2015 136  135 - 145 mmol/L Final  . Potassium 01/01/2015 3.9  3.5 - 5.1 mmol/L Final  . Chloride 01/01/2015 107  101 - 111 mmol/L Final  . CO2 01/01/2015 23  22 - 32 mmol/L Final  .  Glucose, Bld 01/01/2015 107* 65 - 99 mg/dL Final  . BUN 01/01/2015 10  6 - 20 mg/dL Final  . Creatinine, Ser 01/01/2015 0.72  0.44 - 1.00 mg/dL Final  . Calcium 01/01/2015 9.7  8.9 - 10.3 mg/dL Final  . Total Protein 01/01/2015 6.9  6.5 - 8.1 g/dL Final  . Albumin 01/01/2015 2.7* 3.5 - 5.0 g/dL Final  . AST 01/01/2015 20  15 - 41 U/L Final  . ALT 01/01/2015 10* 14 - 54 U/L Final  . Alkaline Phosphatase 01/01/2015 91  38 - 126 U/L Final  . Total Bilirubin 01/01/2015 0.6  0.3 - 1.2 mg/dL Final  . GFR calc non Af Amer 01/01/2015 >60  >60 mL/min Final  . GFR calc Af Amer 01/01/2015 >60  >60 mL/min Final   Comment: (NOTE) The eGFR has been calculated using the CKD EPI equation. This calculation has not been validated in all clinical situations. eGFR's persistently <60 mL/min signify possible Chronic Kidney Disease.   . Anion gap 01/01/2015 6  5 - 15 Final  . Magnesium 01/01/2015 2.0  1.7 - 2.4 mg/dL Final    Assessment:  Sandy [name] is a 69 y.o. female with metastatic colon cancer. She presented with a 53 pound weight loss in 6 months. Colonoscopy on 09/07/2014 revealed a circumferential partially obstructive fungating mass in the descending colon. There was no active bleeding. There was a 1.5 cm pedunculated polyp in the sigmoid colon (tubulovillous adenoma). Pathology from the descending colon mass revealed fragments of adenocarcinoma. There was not enough tissue for KRAS testing.  CEA was 11,793 on 09/05/2014.  Chest CT angiogram on 08/28/2014 revealed no evidence of pulmonary embolism, but multiple large enhancing liver masses (largest 6.4 cm).  PET scan on 10/04/2014 revealed 6.5 x 6.1 cm hypermetabolic mass in the proximal sigmoid colon,  There were  numerous large hypermetabolic hepatic metastases. There were no additional sites of metastatic disease in the neck, chest or skeleton.  In addition, there was a 3.2 x 2.8 cm low-attenuation right thyroid nodule.   She is status  post 6 cycles of FOLFOX chemotherapy (10/09/2014 - 12/18/2014).  She declined Avastin.  She is tolerating her chemotherapy well with only a transient cold neuropathy.  CEA was 10,169 on 10/09/2014 , 12,034 on 0712/2016, 7765 on 11/20/2014, 5154 on 12/04/2014, and 1921 today.  Abdominal and pelvic CT scan on 12/28/2014 revealed response to  therapy.  Liver metastases and the descending/sigmoid mass were smaller.  There was no new disease.  Symptomatically, she feels good.  She has some mild constipation.  She has some constipation.  She has a fungal groin rash.  CEA is decreasing.  Plan: 1.  Review restaging studies.  Discuss response to therapy.  KRAS status remains unknown.  Continue FOLFOX. 2.  Labs today:  CBC with diff, CMP, Mg, CEA. 3.  Cycle #7 FOLFOX today. 4.  RTC in 2 days for pump disconnect. 5.  Nystatin powder to groin.  Patient to call if needs additional Rx. 6.  Follow-up with Dr. Allen Norris for endoscopy to obtain additional tissue for KRAS testing. 7.  Refill percocet 5/325 1 tablet q 6 hours prn pain; dis #20. 8.  RTC in 2 weeks for MD assess, labs (CBC with diff, CMP, CEA, Mg) and cycle #8 FOLFOX.   Lequita Asal, MD  01/01/2015, 9:39 AM

## 2015-01-02 LAB — CEA: CEA: 1921 ng/mL — ABNORMAL HIGH (ref 0.0–4.7)

## 2015-01-03 ENCOUNTER — Inpatient Hospital Stay: Payer: Medicare Other

## 2015-01-03 DIAGNOSIS — C189 Malignant neoplasm of colon, unspecified: Secondary | ICD-10-CM

## 2015-01-03 DIAGNOSIS — C787 Secondary malignant neoplasm of liver and intrahepatic bile duct: Principal | ICD-10-CM

## 2015-01-03 DIAGNOSIS — Z5111 Encounter for antineoplastic chemotherapy: Secondary | ICD-10-CM | POA: Diagnosis not present

## 2015-01-03 MED ORDER — HEPARIN SOD (PORK) LOCK FLUSH 100 UNIT/ML IV SOLN
500.0000 [IU] | Freq: Once | INTRAVENOUS | Status: AC | PRN
  Administered 2015-01-03: 500 [IU]

## 2015-01-03 MED ORDER — SODIUM CHLORIDE 0.9 % IJ SOLN
3.0000 mL | INTRAMUSCULAR | Status: DC | PRN
  Filled 2015-01-03: qty 10

## 2015-01-03 MED ORDER — HEPARIN SOD (PORK) LOCK FLUSH 100 UNIT/ML IV SOLN
INTRAVENOUS | Status: AC
Start: 2015-01-03 — End: 2015-01-03
  Filled 2015-01-03: qty 5

## 2015-01-03 MED ORDER — SODIUM CHLORIDE 0.9 % IJ SOLN
10.0000 mL | INTRAMUSCULAR | Status: DC | PRN
  Administered 2015-01-03: 10 mL
  Filled 2015-01-03: qty 10

## 2015-01-14 ENCOUNTER — Other Ambulatory Visit: Payer: Self-pay

## 2015-01-14 DIAGNOSIS — C787 Secondary malignant neoplasm of liver and intrahepatic bile duct: Principal | ICD-10-CM

## 2015-01-14 DIAGNOSIS — C189 Malignant neoplasm of colon, unspecified: Secondary | ICD-10-CM

## 2015-01-15 ENCOUNTER — Inpatient Hospital Stay: Payer: Medicare Other

## 2015-01-15 ENCOUNTER — Inpatient Hospital Stay: Payer: Medicare Other | Attending: Hematology and Oncology

## 2015-01-15 ENCOUNTER — Inpatient Hospital Stay (HOSPITAL_BASED_OUTPATIENT_CLINIC_OR_DEPARTMENT_OTHER): Payer: Medicare Other | Admitting: Hematology and Oncology

## 2015-01-15 VITALS — BP 121/76 | HR 63 | Temp 94.6°F | Ht 65.0 in | Wt 132.6 lb

## 2015-01-15 DIAGNOSIS — R970 Elevated carcinoembryonic antigen [CEA]: Secondary | ICD-10-CM | POA: Diagnosis not present

## 2015-01-15 DIAGNOSIS — C186 Malignant neoplasm of descending colon: Secondary | ICD-10-CM

## 2015-01-15 DIAGNOSIS — M129 Arthropathy, unspecified: Secondary | ICD-10-CM

## 2015-01-15 DIAGNOSIS — C787 Secondary malignant neoplasm of liver and intrahepatic bile duct: Principal | ICD-10-CM

## 2015-01-15 DIAGNOSIS — K5900 Constipation, unspecified: Secondary | ICD-10-CM | POA: Insufficient documentation

## 2015-01-15 DIAGNOSIS — Z87891 Personal history of nicotine dependence: Secondary | ICD-10-CM | POA: Diagnosis not present

## 2015-01-15 DIAGNOSIS — I10 Essential (primary) hypertension: Secondary | ICD-10-CM

## 2015-01-15 DIAGNOSIS — G629 Polyneuropathy, unspecified: Secondary | ICD-10-CM | POA: Diagnosis not present

## 2015-01-15 DIAGNOSIS — Z5111 Encounter for antineoplastic chemotherapy: Secondary | ICD-10-CM | POA: Diagnosis not present

## 2015-01-15 DIAGNOSIS — C189 Malignant neoplasm of colon, unspecified: Secondary | ICD-10-CM

## 2015-01-15 DIAGNOSIS — G4700 Insomnia, unspecified: Secondary | ICD-10-CM | POA: Insufficient documentation

## 2015-01-15 DIAGNOSIS — Z79899 Other long term (current) drug therapy: Secondary | ICD-10-CM

## 2015-01-15 DIAGNOSIS — E041 Nontoxic single thyroid nodule: Secondary | ICD-10-CM | POA: Diagnosis not present

## 2015-01-15 DIAGNOSIS — M549 Dorsalgia, unspecified: Secondary | ICD-10-CM | POA: Insufficient documentation

## 2015-01-15 DIAGNOSIS — R634 Abnormal weight loss: Secondary | ICD-10-CM

## 2015-01-15 DIAGNOSIS — E119 Type 2 diabetes mellitus without complications: Secondary | ICD-10-CM | POA: Insufficient documentation

## 2015-01-15 LAB — CBC WITH DIFFERENTIAL/PLATELET
Basophils Absolute: 0 K/uL (ref 0–0.1)
Basophils Relative: 1 %
Eosinophils Absolute: 0.1 K/uL (ref 0–0.7)
Eosinophils Relative: 1 %
HCT: 29.5 % — ABNORMAL LOW (ref 35.0–47.0)
Hemoglobin: 9.6 g/dL — ABNORMAL LOW (ref 12.0–16.0)
Lymphocytes Relative: 22 %
Lymphs Abs: 1.3 K/uL (ref 1.0–3.6)
MCH: 29.2 pg (ref 26.0–34.0)
MCHC: 32.6 g/dL (ref 32.0–36.0)
MCV: 89.8 fL (ref 80.0–100.0)
Monocytes Absolute: 0.8 K/uL (ref 0.2–0.9)
Monocytes Relative: 13 %
Neutro Abs: 3.7 K/uL (ref 1.4–6.5)
Neutrophils Relative %: 63 %
Platelets: 198 K/uL (ref 150–440)
RBC: 3.28 MIL/uL — ABNORMAL LOW (ref 3.80–5.20)
RDW: 22.5 % — ABNORMAL HIGH (ref 11.5–14.5)
WBC: 5.8 K/uL (ref 3.6–11.0)

## 2015-01-15 LAB — COMPREHENSIVE METABOLIC PANEL
ALT: 10 U/L — ABNORMAL LOW (ref 14–54)
AST: 19 U/L (ref 15–41)
Albumin: 2.7 g/dL — ABNORMAL LOW (ref 3.5–5.0)
Alkaline Phosphatase: 86 U/L (ref 38–126)
Anion gap: 8 (ref 5–15)
BUN: 10 mg/dL (ref 6–20)
CO2: 21 mmol/L — ABNORMAL LOW (ref 22–32)
Calcium: 8.9 mg/dL (ref 8.9–10.3)
Chloride: 110 mmol/L (ref 101–111)
Creatinine, Ser: 0.72 mg/dL (ref 0.44–1.00)
GFR calc Af Amer: >60 mL/min (ref >60)
GFR calc non Af Amer: >60 mL/min (ref >60)
Glucose, Bld: 119 mg/dL — ABNORMAL HIGH (ref 65–99)
Potassium: 3.9 mmol/L (ref 3.5–5.1)
Sodium: 139 mmol/L (ref 135–145)
Total Bilirubin: 0.7 mg/dL (ref 0.3–1.2)
Total Protein: 6.3 g/dL — ABNORMAL LOW (ref 6.5–8.1)

## 2015-01-15 LAB — MAGNESIUM: Magnesium: 1.5 mg/dL — ABNORMAL LOW (ref 1.7–2.4)

## 2015-01-15 MED ORDER — DEXTROSE 5 % IV SOLN
85.0000 mg/m2 | Freq: Once | INTRAVENOUS | Status: AC
  Administered 2015-01-15: 140 mg via INTRAVENOUS
  Filled 2015-01-15: qty 20

## 2015-01-15 MED ORDER — DEXTROSE 5 % IV SOLN
Freq: Once | INTRAVENOUS | Status: AC
  Administered 2015-01-15: 11:00:00 via INTRAVENOUS
  Filled 2015-01-15: qty 1000

## 2015-01-15 MED ORDER — FLUOROURACIL CHEMO INJECTION 2.5 GM/50ML
400.0000 mg/m2 | Freq: Once | INTRAVENOUS | Status: AC
  Administered 2015-01-15: 650 mg via INTRAVENOUS
  Filled 2015-01-15: qty 13

## 2015-01-15 MED ORDER — MAGNESIUM SULFATE 50 % IJ SOLN: 1.0000 g | Freq: Once | INTRAMUSCULAR | Status: DC

## 2015-01-15 MED ORDER — SODIUM CHLORIDE 0.9 % IV SOLN
Freq: Once | INTRAVENOUS | Status: AC
  Administered 2015-01-15: 12:00:00 via INTRAVENOUS
  Filled 2015-01-15: qty 4

## 2015-01-15 MED ORDER — SODIUM CHLORIDE 0.9 % IV SOLN
Freq: Once | INTRAVENOUS | Status: AC
  Administered 2015-01-15: 11:00:00 via INTRAVENOUS
  Filled 2015-01-15: qty 50

## 2015-01-15 MED ORDER — SODIUM CHLORIDE 0.9 % IV SOLN
2400.0000 mg/m2 | INTRAVENOUS | Status: DC
  Administered 2015-01-15: 3900 mg via INTRAVENOUS
  Filled 2015-01-15: qty 78

## 2015-01-15 MED ORDER — SODIUM CHLORIDE 0.9 % IJ SOLN
10.0000 mL | INTRAMUSCULAR | Status: DC | PRN
  Administered 2015-01-15: 10 mL
  Filled 2015-01-15: qty 10

## 2015-01-15 MED ORDER — LEUCOVORIN CALCIUM INJECTION 350 MG
650.0000 mg | Freq: Once | INTRAMUSCULAR | Status: AC
  Administered 2015-01-15: 650 mg via INTRAVENOUS
  Filled 2015-01-15: qty 25

## 2015-01-15 NOTE — Progress Notes (Signed)
Patient here for follow up.  No complaints. 

## 2015-01-15 NOTE — Progress Notes (Signed)
[name] day:  01/15/2015  Chief Complaint: Sandy [name] is a 69 y.o. female with stage IV colon cancer who is seen for assessment prior to cycle #8 FOLFOX chemotherapy.  HPI: The patient was last seen in the medical oncology clinic on 01/01/2015.  At that time, she was doing well.  She had some mild constipation.  Weight was stable.  She had a fungal groin rash.  Interval imaging studies revealed decreasing disease.  CEA was 1921 (improved).  She received cycle #7 FOLFOX chemotherapy uneventfully.  During the interim, she has felt fine. She notes no problems. She only notes a cold neuropathy. She feels that she has some mild diarrhea caused by Boost. She is now constipated.  Past Medical History  Diagnosis Date  . Hypertension   . Arthritis   . Constipation   . Insomnia   . Diabetes mellitus without complication   . Cancer     liver mets; colon cancer    Past Surgical History  Procedure Laterality Date  . Colonoscopy with propofol N/A 09/07/2014    Procedure: COLONOSCOPY WITH PROPOFOL;  Surgeon: Lucilla Lame, MD;  Location: [facility] ENDOSCOPY;  Service: Endoscopy;  Laterality: N/A;  . Esophagogastroduodenoscopy N/A 09/07/2014    Procedure: ESOPHAGOGASTRODUODENOSCOPY (EGD);  Surgeon: Lucilla Lame, MD;  Location: [facility] ENDOSCOPY;  Service: Endoscopy;  Laterality: N/A;  . Portacath placement Left 09/24/2014    Procedure: INSERTION PORT-A-CATH;  Surgeon: Robert Bellow, MD;  Location: [facility] ORS;  Service: General;  Laterality: Left;  . Liver biopsy      Family History  Problem Relation Age of Onset  . Cancer Sister     Social History:  reports that she quit smoking about 11 years ago. Her smoking use included Cigarettes. She has a 5 pack-year smoking history. She has never used smokeless tobacco. She reports that she does not drink alcohol or use illicit drugs.  The patient is accompanied by her daughter.  Allergies: No Known  Allergies  Current Medications: Current Outpatient Prescriptions  Medication Sig Dispense Refill  . amLODipine (NORVASC) 5 MG tablet Take 5 mg by mouth daily.    Marland Kitchen aspirin EC 81 MG tablet Take 81 mg by mouth daily.    Marland Kitchen atorvastatin (LIPITOR) 40 MG tablet Take 40 mg by mouth daily.    Marland Kitchen lidocaine-prilocaine (EMLA) cream Apply 1 application topically as needed. 30 g 1  . lisinopril-hydrochlorothiazide (PRINZIDE,ZESTORETIC) 20-25 MG per tablet Take 1 tablet by mouth daily.    . megestrol (MEGACE) 400 MG/10ML suspension Take 5 mLs (200 mg total) by mouth daily. to stimulate appetite 240 mL 0  . metFORMIN (GLUCOPHAGE) 1000 MG tablet Take 1,000 mg by mouth 2 (two) times daily.     . metoprolol succinate (TOPROL-XL) 25 MG 24 hr tablet Take 25 mg by mouth daily.    . Multiple Vitamin (MULTIVITAMIN WITH MINERALS) TABS tablet Take 1 tablet by mouth daily.    . ondansetron (ZOFRAN) 4 MG tablet Take 1 tablet (4 mg total) by mouth every 8 (eight) hours as needed for nausea or vomiting. 30 tablet 2  . oxyCODONE-acetaminophen (PERCOCET/ROXICET) 5-325 MG per tablet Take 1 tablet by mouth every 6 (six) hours as needed for severe pain. 20 tablet 0  . Sennosides-Docusate Sodium (STOOL SOFTENER LAXATIVE PO) Take 1 tablet by mouth daily as needed (constipation).    Marland Kitchen zolpidem (AMBIEN) 10 MG tablet Take 1 tablet by mouth at bedtime as needed.    Marland Kitchen  potassium chloride SA (K-DUR,KLOR-CON) 10 MEQ tablet Take 1 tablet (10 mEq total) by mouth once. Take 1 pill twice a day for 3 days then 1 pill a day. (Patient not taking: Reported on 01/15/2015) 20 tablet 0   No current facility-administered medications for this visit.   Facility-Administered Medications Ordered in Other Visits  Medication Dose Route Frequency Provider Last Rate Last Dose  . acetaminophen (TYLENOL) tablet 650 mg  650 mg Oral Once Lequita Asal, MD   650 mg at 10/19/14 1448  . sodium chloride 0.9 % injection 10 mL  10 mL Intracatheter PRN Lequita Asal, MD   10 mL at 10/11/14 1431    Review of Systems:  GENERAL: Feels fine. No fevers or sweats. Weight up 4 pounds. PERFORMANCE STATUS (ECOG): 2. HEENT: Wears a hearing aide. No visual changes, runny nose, sore throat, mouth sores or tenderness. Lungs: No shortness of breath or cough. No hemoptysis. Cardiac: No chest pain, palpitations, orthopnea, or PND. GI:  Diarrhea caused by Boost.  Now has constipation.  No nausea, vomiting, melena or hematochezia. GU: Wears a Depends pad.  No dysuria or hematuria. Musculoskeletal: No back pain. No joint pain. No muscle tenderness. Extremities: No pain or swelling. Skin: No rashes, skin lesions, or ulcers Neuro:  Transient oxaliplatin induced cold neuropathy.  No headache, numbness or weakness, or balance issues. Endocrine: No diabetes, thyroid issues, hot flashes or night sweats. Psych: No mood changes, depression or anxiety. Pain: No pain. Review of systems: All other systems reviewed and found to be negative.  Physical Exam: Blood pressure 121/76, pulse 63, temperature 94.6 F (34.8 C), temperature source Tympanic, height 5' 5"  (1.651 m), weight 132 lb 9.7 oz (60.15 kg). GENERAL: Elderly woman sitting comfortably in a wheelchair in the exam room in no acute distress. MENTAL STATUS: Alert and oriented to person, place and time. HEAD: Styled brown hair. Normocephalic, atraumatic, face symmetric, no Cushingoid features. EYES: Glasses. Hazel/green eyes. Pupils equal round and reactive to light and accomodation. No conjunctivitis or scleral icterus. ENT: Oropharynx clear without lesion. Partial. Tongue normal. Mucous membranes moist.  RESPIRATORY: Clear to auscultation without rales, wheezes or rhonchi. CARDIOVASCULAR: Regular rate and rhythm without murmur, rub or gallop. ABDOMEN: Soft, non-tender with active bowel sounds.  Liver palpable to 2 fingerbreaths above umbilicus. No guarding or rebound tenderness.   SKIN: No rashes, ulcer or skin lesions.  EXTREMITIES: No edema, no skin discoloration or tenderness. No palpable cords. LYMPH NODES: Right lower neck fullness.  No palpable cervical, supraclavicular, axillary or inguinal adenopathy  NEUROLOGICAL: Unremarkable. PSYCH: Appropriate.  Appointment on 01/15/2015  Component Date Value Ref Range Status  . WBC 01/15/2015 5.8  3.6 - 11.0 K/uL Final  . RBC 01/15/2015 3.28* 3.80 - 5.20 MIL/uL Final  . Hemoglobin 01/15/2015 9.6* 12.0 - 16.0 g/dL Final  . HCT 01/15/2015 29.5* 35.0 - 47.0 % Final  . MCV 01/15/2015 89.8  80.0 - 100.0 fL Final  . MCH 01/15/2015 29.2  26.0 - 34.0 pg Final  . MCHC 01/15/2015 32.6  32.0 - 36.0 g/dL Final  . RDW 01/15/2015 22.5* 11.5 - 14.5 % Final  . Platelets 01/15/2015 198  150 - 440 K/uL Final  . Neutrophils Relative % 01/15/2015 63   Final  . Neutro Abs 01/15/2015 3.7  1.4 - 6.5 K/uL Final  . Lymphocytes Relative 01/15/2015 22   Final  . Lymphs Abs 01/15/2015 1.3  1.0 - 3.6 K/uL Final  . Monocytes Relative 01/15/2015 13   Final  .  Monocytes Absolute 01/15/2015 0.8  0.2 - 0.9 K/uL Final  . Eosinophils Relative 01/15/2015 1   Final  . Eosinophils Absolute 01/15/2015 0.1  0 - 0.7 K/uL Final  . Basophils Relative 01/15/2015 1   Final  . Basophils Absolute 01/15/2015 0.0  0 - 0.1 K/uL Final  . Sodium 01/15/2015 139  135 - 145 mmol/L Final  . Potassium 01/15/2015 3.9  3.5 - 5.1 mmol/L Final  . Chloride 01/15/2015 110  101 - 111 mmol/L Final  . CO2 01/15/2015 21* 22 - 32 mmol/L Final  . Glucose, Bld 01/15/2015 119* 65 - 99 mg/dL Final  . BUN 01/15/2015 10  6 - 20 mg/dL Final  . Creatinine, Ser 01/15/2015 0.72  0.44 - 1.00 mg/dL Final  . Calcium 01/15/2015 8.9  8.9 - 10.3 mg/dL Final  . Total Protein 01/15/2015 6.3* 6.5 - 8.1 g/dL Final  . Albumin 01/15/2015 2.7* 3.5 - 5.0 g/dL Final  . AST 01/15/2015 19  15 - 41 U/L Final  . ALT 01/15/2015 10* 14 - 54 U/L Final  . Alkaline Phosphatase 01/15/2015 86  38 - 126 U/L  Final  . Total Bilirubin 01/15/2015 0.7  0.3 - 1.2 mg/dL Final  . GFR calc non Af Amer 01/15/2015 >60  >60 mL/min Final  . GFR calc Af Amer 01/15/2015 >60  >60 mL/min Final   Comment: (NOTE) The eGFR has been calculated using the CKD EPI equation. This calculation has not been validated in all clinical situations. eGFR's persistently <60 mL/min signify possible Chronic Kidney Disease.   . Anion gap 01/15/2015 8  5 - 15 Final  . Magnesium 01/15/2015 1.5* 1.7 - 2.4 mg/dL Final    Assessment:  Sandy [name] is a 69 y.o. female with metastatic colon cancer. She presented with a 53 pound weight loss in 6 months. Colonoscopy on 09/07/2014 revealed a circumferential partially obstructive fungating mass in the descending colon. There was no active bleeding. There was a 1.5 cm pedunculated polyp in the sigmoid colon (tubulovillous adenoma). Pathology from the descending colon mass revealed fragments of adenocarcinoma. There was not enough tissue for KRAS testing.  CEA was 11,793 on 09/05/2014.  Chest CT angiogram on 08/28/2014 revealed no evidence of pulmonary embolism, but multiple large enhancing liver masses (largest 6.4 cm).  PET scan on 10/04/2014 revealed 6.5 x 6.1 cm hypermetabolic mass in the proximal sigmoid colon,  There were  numerous large hypermetabolic hepatic metastases. There were no additional sites of metastatic disease in the neck, chest or skeleton.  In addition, there was a 3.2 x 2.8 cm low-attenuation right thyroid nodule.   She is status post 7 cycles of FOLFOX chemotherapy (10/09/2014 - 01/01/2015).  She declined Avastin.  She is tolerating her chemotherapy well with only a transient cold neuropathy.  CEA was 10,169 on 10/09/2014 , 12,034 on 0712/2016, 7765 on 11/20/2014, 5154 on 12/04/2014, 1921 on 01/01/2015, and 1266 today.  Abdominal and pelvic CT scan on 12/28/2014 revealed response to therapy.  Liver metastases and the descending/sigmoid mass were smaller.  There  was no new disease.  Symptomatically, she feels good. 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  She has had some diarrhea cause by Boost, but she is now constipated.  Magnesium is low.  CEA is decreasing.  Plan: 1.  Labs today:  CBC with diff, CMP, Mg, CEA. 2.  Cycle #8 FOLFOX today. 3.  RTC in 2 days for pump disconnect. 4.  IV magnesium today. 5.  RTC in 2 weeks for  MD assess, labs (CBC with diff, CMP, CEA, Mg), and cycle #9 FOLFOX.   Lequita Asal, MD  01/15/2015, 10:05 AM

## 2015-01-16 LAB — CEA: CEA: 1266 ng/mL — ABNORMAL HIGH (ref 0.0–4.7)

## 2015-01-17 ENCOUNTER — Inpatient Hospital Stay: Payer: Medicare Other

## 2015-01-17 DIAGNOSIS — C801 Malignant (primary) neoplasm, unspecified: Secondary | ICD-10-CM

## 2015-01-17 DIAGNOSIS — Z5111 Encounter for antineoplastic chemotherapy: Secondary | ICD-10-CM | POA: Diagnosis not present

## 2015-01-17 MED ORDER — SODIUM CHLORIDE 0.9 % IJ SOLN
10.0000 mL | INTRAMUSCULAR | Status: DC | PRN
  Administered 2015-01-17: 10 mL via INTRAVENOUS
  Filled 2015-01-17: qty 10

## 2015-01-17 MED ORDER — HEPARIN SOD (PORK) LOCK FLUSH 100 UNIT/ML IV SOLN
500.0000 [IU] | Freq: Once | INTRAVENOUS | Status: AC
  Administered 2015-01-17: 500 [IU] via INTRAVENOUS

## 2015-01-17 MED ORDER — HEPARIN SOD (PORK) LOCK FLUSH 100 UNIT/ML IV SOLN
INTRAVENOUS | Status: AC
Start: 2015-01-17 — End: 2015-01-17
  Filled 2015-01-17: qty 5

## 2015-01-24 ENCOUNTER — Telehealth: Payer: Self-pay | Admitting: *Deleted

## 2015-01-24 MED ORDER — OXYCODONE-ACETAMINOPHEN 5-325 MG PO TABS: 1.0000 | ORAL_TABLET | Freq: Four times a day (QID) | ORAL | Status: DC | PRN

## 2015-01-24 NOTE — Telephone Encounter (Signed)
Having back pain and constipation. Last bm was two days ago and only a small amt. Advised in use of stool softeners twice a day and magnesium Citrate 1/2 bottle if no results, after 20 mins take the other 1/2.

## 2015-01-24 NOTE — Telephone Encounter (Signed)
Informed that prescription is ready to pick up  

## 2015-01-28 ENCOUNTER — Other Ambulatory Visit: Payer: Self-pay

## 2015-01-28 DIAGNOSIS — C189 Malignant neoplasm of colon, unspecified: Secondary | ICD-10-CM

## 2015-01-28 DIAGNOSIS — C787 Secondary malignant neoplasm of liver and intrahepatic bile duct: Principal | ICD-10-CM

## 2015-01-29 ENCOUNTER — Inpatient Hospital Stay: Payer: Medicare Other

## 2015-01-29 ENCOUNTER — Inpatient Hospital Stay (HOSPITAL_BASED_OUTPATIENT_CLINIC_OR_DEPARTMENT_OTHER): Payer: Medicare Other | Admitting: Hematology and Oncology

## 2015-01-29 VITALS — BP 146/96 | HR 60 | Temp 95.3°F | Resp 18 | Ht 65.0 in | Wt 125.9 lb

## 2015-01-29 DIAGNOSIS — C787 Secondary malignant neoplasm of liver and intrahepatic bile duct: Principal | ICD-10-CM

## 2015-01-29 DIAGNOSIS — R970 Elevated carcinoembryonic antigen [CEA]: Secondary | ICD-10-CM

## 2015-01-29 DIAGNOSIS — Z79899 Other long term (current) drug therapy: Secondary | ICD-10-CM

## 2015-01-29 DIAGNOSIS — K5900 Constipation, unspecified: Secondary | ICD-10-CM

## 2015-01-29 DIAGNOSIS — R634 Abnormal weight loss: Secondary | ICD-10-CM

## 2015-01-29 DIAGNOSIS — C189 Malignant neoplasm of colon, unspecified: Secondary | ICD-10-CM

## 2015-01-29 DIAGNOSIS — M129 Arthropathy, unspecified: Secondary | ICD-10-CM

## 2015-01-29 DIAGNOSIS — G4700 Insomnia, unspecified: Secondary | ICD-10-CM

## 2015-01-29 DIAGNOSIS — M7918 Myalgia, other site: Secondary | ICD-10-CM

## 2015-01-29 DIAGNOSIS — E041 Nontoxic single thyroid nodule: Secondary | ICD-10-CM

## 2015-01-29 DIAGNOSIS — I10 Essential (primary) hypertension: Secondary | ICD-10-CM

## 2015-01-29 DIAGNOSIS — Z5111 Encounter for antineoplastic chemotherapy: Secondary | ICD-10-CM | POA: Diagnosis not present

## 2015-01-29 DIAGNOSIS — M549 Dorsalgia, unspecified: Secondary | ICD-10-CM

## 2015-01-29 DIAGNOSIS — E119 Type 2 diabetes mellitus without complications: Secondary | ICD-10-CM

## 2015-01-29 DIAGNOSIS — Z87891 Personal history of nicotine dependence: Secondary | ICD-10-CM

## 2015-01-29 DIAGNOSIS — G629 Polyneuropathy, unspecified: Secondary | ICD-10-CM

## 2015-01-29 LAB — COMPREHENSIVE METABOLIC PANEL
ALT: 13 U/L — ABNORMAL LOW (ref 14–54)
AST: 24 U/L (ref 15–41)
Albumin: 3 g/dL — ABNORMAL LOW (ref 3.5–5.0)
Alkaline Phosphatase: 112 U/L (ref 38–126)
Anion gap: 6 (ref 5–15)
BUN: 9 mg/dL (ref 6–20)
CO2: 22 mmol/L (ref 22–32)
Calcium: 9.6 mg/dL (ref 8.9–10.3)
Chloride: 110 mmol/L (ref 101–111)
Creatinine, Ser: 0.8 mg/dL (ref 0.44–1.00)
GFR calc Af Amer: >60 mL/min (ref >60)
GFR calc non Af Amer: >60 mL/min (ref >60)
Glucose, Bld: 159 mg/dL — ABNORMAL HIGH (ref 65–99)
Potassium: 3.8 mmol/L (ref 3.5–5.1)
Sodium: 138 mmol/L (ref 135–145)
Total Bilirubin: 0.4 mg/dL (ref 0.3–1.2)
Total Protein: 6.9 g/dL (ref 6.5–8.1)

## 2015-01-29 LAB — CBC WITH DIFFERENTIAL/PLATELET
Basophils Absolute: 0 10*3/uL (ref 0–0.1)
Basophils Relative: 0 %
Eosinophils Absolute: 0 10*3/uL (ref 0–0.7)
Eosinophils Relative: 1 %
HCT: 31.7 % — ABNORMAL LOW (ref 35.0–47.0)
Hemoglobin: 10.3 g/dL — ABNORMAL LOW (ref 12.0–16.0)
Lymphocytes Relative: 26 %
Lymphs Abs: 1.3 10*3/uL (ref 1.0–3.6)
MCH: 29.6 pg (ref 26.0–34.0)
MCHC: 32.5 g/dL (ref 32.0–36.0)
MCV: 91 fL (ref 80.0–100.0)
Monocytes Absolute: 0.5 10*3/uL (ref 0.2–0.9)
Monocytes Relative: 10 %
Neutro Abs: 3 10*3/uL (ref 1.4–6.5)
Neutrophils Relative %: 63 %
Platelets: 273 10*3/uL (ref 150–440)
RBC: 3.49 MIL/uL — ABNORMAL LOW (ref 3.80–5.20)
RDW: 21.1 % — ABNORMAL HIGH (ref 11.5–14.5)
WBC: 4.8 10*3/uL (ref 3.6–11.0)

## 2015-01-29 LAB — MAGNESIUM: Magnesium: 1.8 mg/dL (ref 1.7–2.4)

## 2015-01-29 MED ORDER — DEXTROSE 5 % IV SOLN
650.0000 mg | Freq: Once | INTRAVENOUS | Status: AC
  Administered 2015-01-29: 650 mg via INTRAVENOUS
  Filled 2015-01-29: qty 32.5

## 2015-01-29 MED ORDER — HEPARIN SOD (PORK) LOCK FLUSH 100 UNIT/ML IV SOLN: 500.0000 [IU] | Freq: Once | INTRAVENOUS | Status: DC | PRN

## 2015-01-29 MED ORDER — DEXTROSE 5 % IV SOLN
Freq: Once | INTRAVENOUS | Status: AC
  Administered 2015-01-29: 11:00:00 via INTRAVENOUS
  Filled 2015-01-29: qty 1000

## 2015-01-29 MED ORDER — FLUOROURACIL CHEMO INJECTION 2.5 GM/50ML
400.0000 mg/m2 | Freq: Once | INTRAVENOUS | Status: AC
  Administered 2015-01-29: 650 mg via INTRAVENOUS
  Filled 2015-01-29: qty 13

## 2015-01-29 MED ORDER — SODIUM CHLORIDE 0.9 % IV SOLN
2400.0000 mg/m2 | INTRAVENOUS | Status: DC
  Administered 2015-01-29: 3900 mg via INTRAVENOUS
  Filled 2015-01-29: qty 78

## 2015-01-29 MED ORDER — SODIUM CHLORIDE 0.9 % IV SOLN
Freq: Once | INTRAVENOUS | Status: AC
  Administered 2015-01-29: 11:00:00 via INTRAVENOUS
  Filled 2015-01-29: qty 4

## 2015-01-29 MED ORDER — SODIUM CHLORIDE 0.9 % IJ SOLN
10.0000 mL | INTRAMUSCULAR | Status: DC | PRN
  Administered 2015-01-29: 10 mL
  Filled 2015-01-29: qty 10

## 2015-01-29 MED ORDER — OXALIPLATIN CHEMO INJECTION 100 MG/20ML
85.0000 mg/m2 | Freq: Once | INTRAVENOUS | Status: AC
  Administered 2015-01-29: 140 mg via INTRAVENOUS
  Filled 2015-01-29: qty 8

## 2015-01-29 NOTE — Progress Notes (Signed)
Patient is here for follow-up of colon cancer and folfox treatment. Patient states that she has some off and on pain in her right side and back. Patient rates her pain 7/10. She has also been having some constipation and is taking senna.

## 2015-01-29 NOTE — Progress Notes (Signed)
[facility] day:  01/29/2015  Chief Complaint: Sandy [name] is a 69 y.o. female with stage IV colon cancer who is seen for assessment prior to cycle #9 FOLFOX chemotherapy.  HPI: The patient was last seen in the medical oncology clinic on 01/15/2015.  At that time, she was seen for assessment prior to cycle #8 FOLFOX chemotherapy.  Symptomatically , she was doing well.  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 Magnesium was low.  She received her chemotherapy and IV magnesium uneventfully.  During the interim, she has continued to do well.  She has some on and off right sided back pain.  She has constipation for which she is using Senna.  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  Past Medical History  Diagnosis Date  . Hypertension   . Arthritis   . Constipation   . Insomnia   . Diabetes mellitus without complication   . Cancer     liver mets; colon cancer    Past Surgical History  Procedure Laterality Date  . Colonoscopy with propofol N/A 09/07/2014    Procedure: COLONOSCOPY WITH PROPOFOL;  Surgeon: Lucilla Lame, MD;  Location: [facility] ENDOSCOPY;  Service: Endoscopy;  Laterality: N/A;  . Esophagogastroduodenoscopy N/A 09/07/2014    Procedure: ESOPHAGOGASTRODUODENOSCOPY (EGD);  Surgeon: Lucilla Lame, MD;  Location: [facility] ENDOSCOPY;  Service: Endoscopy;  Laterality: N/A;  . Portacath placement Left 09/24/2014    Procedure: INSERTION PORT-A-CATH;  Surgeon: Robert Bellow, MD;  Location: [facility] ORS;  Service: General;  Laterality: Left;  . Liver biopsy      Family History  Problem Relation Age of Onset  . Cancer Sister     Social History:  reports that she quit smoking about 11 years ago. Her smoking use included Cigarettes. She has a 5 pack-year smoking history. She has never used smokeless tobacco. She reports that she does not drink alcohol or use illicit drugs.  The patient is accompanied by her  daughter.  Allergies: No Known Allergies  Current Medications: Current Outpatient Prescriptions  Medication Sig Dispense Refill  . amLODipine (NORVASC) 5 MG tablet Take 5 mg by mouth daily.    Marland Kitchen aspirin EC 81 MG tablet Take 81 mg by mouth daily.    Marland Kitchen atorvastatin (LIPITOR) 40 MG tablet Take 40 mg by mouth daily.    Marland Kitchen lidocaine-prilocaine (EMLA) cream Apply 1 application topically as needed. 30 g 1  . lisinopril-hydrochlorothiazide (PRINZIDE,ZESTORETIC) 20-25 MG per tablet Take 1 tablet by mouth daily.    . megestrol (MEGACE) 400 MG/10ML suspension Take 5 mLs (200 mg total) by mouth daily. to stimulate appetite 240 mL 0  . metFORMIN (GLUCOPHAGE) 1000 MG tablet Take 1,000 mg by mouth 2 (two) times daily.     . metoprolol succinate (TOPROL-XL) 25 MG 24 hr tablet Take 25 mg by mouth daily.    . Multiple Vitamin (MULTIVITAMIN WITH MINERALS) TABS tablet Take 1 tablet by mouth daily.    . ondansetron (ZOFRAN) 4 MG tablet Take 1 tablet (4 mg total) by mouth every 8 (eight) hours as needed for nausea or vomiting. 30 tablet 2  . oxyCODONE-acetaminophen (PERCOCET/ROXICET) 5-325 MG tablet Take 1 tablet by mouth every 6 (six) hours as needed for severe pain. 20 tablet 0  . potassium chloride SA (K-DUR,KLOR-CON) 10 MEQ tablet Take 1 tablet (10 mEq total) by mouth once. Take 1 pill twice a day for 3 days then 1 pill a day. 20 tablet 0  .  Sennosides-Docusate Sodium (STOOL SOFTENER LAXATIVE PO) Take 1 tablet by mouth daily as needed (constipation).    Marland Kitchen zolpidem (AMBIEN) 10 MG tablet Take 1 tablet by mouth at bedtime as needed.     No current facility-administered medications for this visit.   Facility-Administered Medications Ordered in Other Visits  Medication Dose Route Frequency Provider Last Rate Last Dose  . acetaminophen (TYLENOL) tablet 650 mg  650 mg Oral Once Lequita Asal, MD   650 mg at 10/19/14 6294  . fluorouracil (ADRUCIL) 3,900 mg in sodium chloride 0.9 % 72 mL chemo infusion  2,400  mg/m2 (Treatment Plan Actual) Intravenous 1 day or 1 dose Lequita Asal, MD   3,900 mg at 01/29/15 1428  . heparin lock flush 100 unit/mL  500 Units Intracatheter Once PRN Lequita Asal, MD      . sodium chloride 0.9 % injection 10 mL  10 mL Intracatheter PRN Lequita Asal, MD   10 mL at 10/11/14 1431  . sodium chloride 0.9 % injection 10 mL  10 mL Intracatheter PRN Lequita Asal, MD   10 mL at 01/29/15 1056    Review of Systems:  GENERAL: Feels good. No fevers or sweats.  Weight down. PERFORMANCE STATUS (ECOG): 2. HEENT: Wears a hearing aide. No visual changes, runny nose, sore throat, mouth sores or tenderness. Lungs: No shortness of breath or cough. No hemoptysis. Cardiac: No chest pain, palpitations, orthopnea, or PND. GI:  Constipation.  No nausea, vomiting, diarrhea, melena or hematochezia. GU: Wears a Depends pad.  No dysuria or hematuria. Musculoskeletal: On and off right sided back pain. No joint pain. No muscle tenderness. Extremities: No pain or swelling. Skin: No rashes, skin lesions, or ulcers Neuro:  Transient oxaliplatin induced cold neuropathy.  No headache, numbness or weakness, or balance issues. Endocrine: No diabetes, thyroid issues, hot flashes or night sweats. Psych: No mood changes, depression or anxiety. Pain: No pain. Review of systems: All other systems reviewed and found to be negative.  Physical Exam: Blood pressure 146/96, pulse 60, temperature 95.3 F (35.2 C), temperature source Tympanic, resp. rate 18, height _0  (1.651 m), weight 125 lb 14.1 oz (57.1 kg). GENERAL: Elderly woman sitting comfortably in a wheelchair in the exam room in no acute distress. MENTAL STATUS: Alert and oriented to person, place and time. HEAD: Styled hair. Normocephalic, atraumatic, face symmetric, no Cushingoid features. EYES: Glasses. Hazel/green eyes. Pupils equal round and reactive to light and accomodation. No conjunctivitis or  scleral icterus. ENT: Oropharynx clear without lesion. Partial. Tongue normal. Mucous membranes moist.  RESPIRATORY: Clear to auscultation without rales, wheezes or rhonchi. CARDIOVASCULAR: Regular rate and rhythm without murmur, rub or gallop. ABDOMEN: Soft, non-tender with active bowel sounds. No guarding or rebound tenderness. No hepatosplenomegaly. BACK:  No pain on palpation of spine.  Right sided paravertebral muscle tenderness. SKIN: No rashes, ulcer or skin lesions.  EXTREMITIES: No edema, no skin discoloration or tenderness. No palpable cords. LYMPH NODES: No palpable cervical, supraclavicular, axillary or inguinal adenopathy  NEUROLOGICAL: Unremarkable. PSYCH: Appropriate.  Appointment on 01/29/2015  Component Date Value Ref Range Status  . WBC 01/29/2015 4.8  3.6 - 11.0 K/uL Final  . RBC 01/29/2015 3.49* 3.80 - 5.20 MIL/uL Final  . Hemoglobin 01/29/2015 10.3* 12.0 - 16.0 g/dL Final  . HCT 01/29/2015 31.7* 35.0 - 47.0 % Final  . MCV 01/29/2015 91.0  80.0 - 100.0 fL Final  . MCH 01/29/2015 29.6  26.0 - 34.0 pg Final  . MCHC  01/29/2015 32.5  32.0 - 36.0 g/dL Final  . RDW 01/29/2015 21.1* 11.5 - 14.5 % Final  . Platelets 01/29/2015 273  150 - 440 K/uL Final  . Neutrophils Relative % 01/29/2015 63   Final  . Neutro Abs 01/29/2015 3.0  1.4 - 6.5 K/uL Final  . Lymphocytes Relative 01/29/2015 26   Final  . Lymphs Abs 01/29/2015 1.3  1.0 - 3.6 K/uL Final  . Monocytes Relative 01/29/2015 10   Final  . Monocytes Absolute 01/29/2015 0.5  0.2 - 0.9 K/uL Final  . Eosinophils Relative 01/29/2015 1   Final  . Eosinophils Absolute 01/29/2015 0.0  0 - 0.7 K/uL Final  . Basophils Relative 01/29/2015 0   Final  . Basophils Absolute 01/29/2015 0.0  0 - 0.1 K/uL Final  . Sodium 01/29/2015 138  135 - 145 mmol/L Final  . Potassium 01/29/2015 3.8  3.5 - 5.1 mmol/L Final  . Chloride 01/29/2015 110  101 - 111 mmol/L Final  . CO2 01/29/2015 22  22 - 32 mmol/L Final  . Glucose, Bld  01/29/2015 159* 65 - 99 mg/dL Final  . BUN 01/29/2015 9  6 - 20 mg/dL Final  . Creatinine, Ser 01/29/2015 0.80  0.44 - 1.00 mg/dL Final  . Calcium 01/29/2015 9.6  8.9 - 10.3 mg/dL Final  . Total Protein 01/29/2015 6.9  6.5 - 8.1 g/dL Final  . Albumin 01/29/2015 3.0* 3.5 - 5.0 g/dL Final  . AST 01/29/2015 24  15 - 41 U/L Final  . ALT 01/29/2015 13* 14 - 54 U/L Final  . Alkaline Phosphatase 01/29/2015 112  38 - 126 U/L Final  . Total Bilirubin 01/29/2015 0.4  0.3 - 1.2 mg/dL Final  . GFR calc non Af Amer 01/29/2015 >60  >60 mL/min Final  . GFR calc Af Amer 01/29/2015 >60  >60 mL/min Final   Comment: (NOTE) The eGFR has been calculated using the CKD EPI equation. This calculation has not been validated in all clinical situations. eGFR's persistently <60 mL/min signify possible Chronic Kidney Disease.   . Anion gap 01/29/2015 6  5 - 15 Final  . Magnesium 01/29/2015 1.8  1.7 - 2.4 mg/dL Final    Assessment:  CAIT LOCUST is a 69 y.o. female with metastatic colon cancer. She presented with a 53 pound weight loss in 6 months. Colonoscopy on 09/07/2014 revealed a circumferential partially obstructive fungating mass in the descending colon. There was no active bleeding. There was a 1.5 cm pedunculated polyp in the sigmoid colon (tubulovillous adenoma). Pathology from the descending colon mass revealed fragments of adenocarcinoma. There was not enough tissue for KRAS testing.  CEA was 11,793 on 09/05/2014.  Chest CT angiogram on 08/28/2014 revealed no evidence of pulmonary embolism, but multiple large enhancing liver masses (largest 6.4 cm).  PET scan on 10/04/2014 revealed 6.5 x 6.1 cm hypermetabolic mass in the proximal sigmoid colon,  There were  numerous large hypermetabolic hepatic metastases. There were no additional sites of metastatic disease in the neck, chest or skeleton.  In addition, there was a 3.2 x 2.8 cm low-attenuation right thyroid nodule.   She is status post 8 cycles  of FOLFOX chemotherapy (10/09/2014 - 01/15/2015).  She declined Avastin.  She is tolerating her chemotherapy well with only a transient cold neuropathy.  CEA was 10,169 on 10/09/2014 , 12,034 on 0712/2016, 7765 on 11/20/2014, 5154 on 12/04/2014, 1921 on 01/01/2015, 1266 on 01/15/2015, and 770.6 today.  Abdominal and pelvic CT scan on 12/28/2014 revealed response to  therapy.  Liver metastases and the descending/sigmoid mass were smaller.  There was no new disease.  Symptomatically, she has mild constipation.  She has right sided lumbar paravertebral muscular pain.  Weight is down.  CEA continues to decline.  Plan: 1.  Labs today:  CBC with diff, CMP, Mg, CEA. 2.  Cycle #9 FOLFOX today. 3.  RTC in 2 days for pump disconnect. 4.  Discuss management of constipation and musculoskeletal pain. 5.  Anticipate next restaging studies around 03/29/2015. 6.  Discuss weight and caloric intake. 7.  RTC in 2 weeks for MD assess, labs (CBC with diff, CMP, CEA, Mg), and cycle #10 FOLFOX.   Lequita Asal, MD  01/29/2015, 5:01 PM

## 2015-01-30 LAB — CEA: CEA: 770.6 ng/mL — ABNORMAL HIGH (ref 0.0–4.7)

## 2015-01-31 ENCOUNTER — Inpatient Hospital Stay: Payer: Medicare Other

## 2015-01-31 VITALS — BP 129/80 | HR 62 | Temp 97.0°F | Resp 18

## 2015-01-31 DIAGNOSIS — C787 Secondary malignant neoplasm of liver and intrahepatic bile duct: Principal | ICD-10-CM

## 2015-01-31 DIAGNOSIS — Z5111 Encounter for antineoplastic chemotherapy: Secondary | ICD-10-CM | POA: Diagnosis not present

## 2015-01-31 DIAGNOSIS — C189 Malignant neoplasm of colon, unspecified: Secondary | ICD-10-CM

## 2015-01-31 MED ORDER — HEPARIN SOD (PORK) LOCK FLUSH 100 UNIT/ML IV SOLN
500.0000 [IU] | Freq: Once | INTRAVENOUS | Status: AC | PRN
  Administered 2015-01-31: 500 [IU]

## 2015-01-31 MED ORDER — SODIUM CHLORIDE 0.9 % IJ SOLN
10.0000 mL | INTRAMUSCULAR | Status: DC | PRN
  Administered 2015-01-31: 10 mL
  Filled 2015-01-31: qty 10

## 2015-02-12 ENCOUNTER — Other Ambulatory Visit: Payer: Medicare Other

## 2015-02-12 ENCOUNTER — Ambulatory Visit: Payer: Medicare Other | Admitting: Oncology

## 2015-02-12 ENCOUNTER — Ambulatory Visit: Payer: Medicare Other

## 2015-02-12 ENCOUNTER — Inpatient Hospital Stay: Payer: Medicare Other | Attending: Oncology

## 2015-02-12 ENCOUNTER — Inpatient Hospital Stay (HOSPITAL_BASED_OUTPATIENT_CLINIC_OR_DEPARTMENT_OTHER): Payer: Medicare Other | Admitting: Family Medicine

## 2015-02-12 ENCOUNTER — Inpatient Hospital Stay: Payer: Medicare Other

## 2015-02-12 ENCOUNTER — Encounter: Payer: Self-pay | Admitting: Hematology and Oncology

## 2015-02-12 ENCOUNTER — Other Ambulatory Visit: Payer: Self-pay | Admitting: *Deleted

## 2015-02-12 ENCOUNTER — Encounter: Payer: Self-pay | Admitting: Oncology

## 2015-02-12 VITALS — BP 130/71 | HR 64 | Temp 96.2°F | Resp 18 | Ht 65.0 in | Wt 134.5 lb

## 2015-02-12 DIAGNOSIS — G629 Polyneuropathy, unspecified: Secondary | ICD-10-CM | POA: Insufficient documentation

## 2015-02-12 DIAGNOSIS — C787 Secondary malignant neoplasm of liver and intrahepatic bile duct: Secondary | ICD-10-CM | POA: Insufficient documentation

## 2015-02-12 DIAGNOSIS — C189 Malignant neoplasm of colon, unspecified: Secondary | ICD-10-CM

## 2015-02-12 DIAGNOSIS — D125 Benign neoplasm of sigmoid colon: Secondary | ICD-10-CM | POA: Diagnosis not present

## 2015-02-12 DIAGNOSIS — E041 Nontoxic single thyroid nodule: Secondary | ICD-10-CM | POA: Insufficient documentation

## 2015-02-12 DIAGNOSIS — Z79899 Other long term (current) drug therapy: Secondary | ICD-10-CM

## 2015-02-12 DIAGNOSIS — G4700 Insomnia, unspecified: Secondary | ICD-10-CM | POA: Insufficient documentation

## 2015-02-12 DIAGNOSIS — E119 Type 2 diabetes mellitus without complications: Secondary | ICD-10-CM | POA: Diagnosis not present

## 2015-02-12 DIAGNOSIS — Z5111 Encounter for antineoplastic chemotherapy: Secondary | ICD-10-CM | POA: Insufficient documentation

## 2015-02-12 DIAGNOSIS — K5900 Constipation, unspecified: Secondary | ICD-10-CM | POA: Diagnosis not present

## 2015-02-12 DIAGNOSIS — Z794 Long term (current) use of insulin: Secondary | ICD-10-CM | POA: Insufficient documentation

## 2015-02-12 DIAGNOSIS — I10 Essential (primary) hypertension: Secondary | ICD-10-CM | POA: Diagnosis not present

## 2015-02-12 DIAGNOSIS — M129 Arthropathy, unspecified: Secondary | ICD-10-CM | POA: Insufficient documentation

## 2015-02-12 DIAGNOSIS — Z7982 Long term (current) use of aspirin: Secondary | ICD-10-CM | POA: Insufficient documentation

## 2015-02-12 DIAGNOSIS — M7918 Myalgia, other site: Secondary | ICD-10-CM | POA: Insufficient documentation

## 2015-02-12 DIAGNOSIS — Z87891 Personal history of nicotine dependence: Secondary | ICD-10-CM | POA: Diagnosis not present

## 2015-02-12 LAB — COMPREHENSIVE METABOLIC PANEL
ALT: 11 U/L — ABNORMAL LOW (ref 14–54)
AST: 26 U/L (ref 15–41)
Albumin: 3 g/dL — ABNORMAL LOW (ref 3.5–5.0)
Alkaline Phosphatase: 93 U/L (ref 38–126)
Anion gap: 8 (ref 5–15)
BUN: 10 mg/dL (ref 6–20)
CO2: 22 mmol/L (ref 22–32)
Calcium: 9.7 mg/dL (ref 8.9–10.3)
Chloride: 108 mmol/L (ref 101–111)
Creatinine, Ser: 0.79 mg/dL (ref 0.44–1.00)
GFR calc Af Amer: >60 mL/min (ref >60)
GFR calc non Af Amer: >60 mL/min (ref >60)
Glucose, Bld: 152 mg/dL — ABNORMAL HIGH (ref 65–99)
Potassium: 3.7 mmol/L (ref 3.5–5.1)
Sodium: 138 mmol/L (ref 135–145)
Total Bilirubin: 0.6 mg/dL (ref 0.3–1.2)
Total Protein: 6.7 g/dL (ref 6.5–8.1)

## 2015-02-12 LAB — CBC
HCT: 31.7 % — ABNORMAL LOW (ref 35.0–47.0)
Hemoglobin: 10.3 g/dL — ABNORMAL LOW (ref 12.0–16.0)
MCH: 29.8 pg (ref 26.0–34.0)
MCHC: 32.5 g/dL (ref 32.0–36.0)
MCV: 91.6 fL (ref 80.0–100.0)
Platelets: 195 10*3/uL (ref 150–440)
RBC: 3.46 MIL/uL — ABNORMAL LOW (ref 3.80–5.20)
RDW: 20.3 % — ABNORMAL HIGH (ref 11.5–14.5)
WBC: 5.4 10*3/uL (ref 3.6–11.0)

## 2015-02-12 LAB — MAGNESIUM: Magnesium: 1.8 mg/dL (ref 1.7–2.4)

## 2015-02-12 MED ORDER — FLUOROURACIL CHEMO INJECTION 2.5 GM/50ML
400.0000 mg/m2 | Freq: Once | INTRAVENOUS | Status: AC
  Administered 2015-02-12: 650 mg via INTRAVENOUS
  Filled 2015-02-12: qty 13

## 2015-02-12 MED ORDER — OXALIPLATIN CHEMO INJECTION 100 MG/20ML
85.0000 mg/m2 | Freq: Once | INTRAVENOUS | Status: AC
  Administered 2015-02-12: 140 mg via INTRAVENOUS
  Filled 2015-02-12: qty 20

## 2015-02-12 MED ORDER — SODIUM CHLORIDE 0.9 % IV SOLN
2400.0000 mg/m2 | INTRAVENOUS | Status: DC
  Administered 2015-02-12: 3900 mg via INTRAVENOUS
  Filled 2015-02-12: qty 78

## 2015-02-12 MED ORDER — OXYCODONE-ACETAMINOPHEN 5-325 MG PO TABS: 1.0000 | ORAL_TABLET | Freq: Four times a day (QID) | ORAL | Status: DC | PRN

## 2015-02-12 MED ORDER — HEPARIN SOD (PORK) LOCK FLUSH 100 UNIT/ML IV SOLN
500.0000 [IU] | Freq: Once | INTRAVENOUS | Status: DC | PRN
Start: 2015-02-12 — End: 2015-02-12

## 2015-02-12 MED ORDER — LEUCOVORIN CALCIUM INJECTION 350 MG
650.0000 mg | Freq: Once | INTRAVENOUS | Status: AC
  Administered 2015-02-12: 650 mg via INTRAVENOUS
  Filled 2015-02-12: qty 10

## 2015-02-12 MED ORDER — DEXTROSE 5 % IV SOLN
Freq: Once | INTRAVENOUS | Status: AC
  Administered 2015-02-12: 11:00:00 via INTRAVENOUS
  Filled 2015-02-12: qty 1000

## 2015-02-12 MED ORDER — SODIUM CHLORIDE 0.9 % IV SOLN
Freq: Once | INTRAVENOUS | Status: AC
  Administered 2015-02-12: 11:00:00 via INTRAVENOUS
  Filled 2015-02-12: qty 4

## 2015-02-12 NOTE — Progress Notes (Signed)
Appetite good, constipation is under control with medicine. Back pain still hurting in lower back.

## 2015-02-12 NOTE — Progress Notes (Signed)
[place] [facility] day:  02/12/2015  Chief Complaint: JONEISHA MILES is a 69 y.o. female with stage IV colon cancer who is seen for assessment prior to cycle #10 FOLFOX chemotherapy.  HPI: The patient was last seen in the medical oncology clinic on 01/29/2015.  At that time, she was seen for assessment prior to cycle #9 FOLFOX chemotherapy.  Symptomatically, she was doing well.  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   Patient has continued to feel well. Reports increasing appetite as well as weight gain. She continues with cold neuropathy but this remains stable. Overall patient is feeling very well and denies any acute complaints.   Past Medical History  Diagnosis Date  . Hypertension   . Arthritis   . Constipation   . Insomnia   . Diabetes mellitus without complication ([place])   . Cancer [facility])     liver mets; colon cancer    Past Surgical History  Procedure Laterality Date  . Colonoscopy with propofol N/A 09/07/2014    Procedure: COLONOSCOPY WITH PROPOFOL;  Surgeon: Lucilla Lame, MD;  Location: [facility] ENDOSCOPY;  Service: Endoscopy;  Laterality: N/A;  . Esophagogastroduodenoscopy N/A 09/07/2014    Procedure: ESOPHAGOGASTRODUODENOSCOPY (EGD);  Surgeon: Lucilla Lame, MD;  Location: [facility] ENDOSCOPY;  Service: Endoscopy;  Laterality: N/A;  . Portacath placement Left 09/24/2014    Procedure: INSERTION PORT-A-CATH;  Surgeon: Robert Bellow, MD;  Location: [facility] ORS;  Service: General;  Laterality: Left;  . Liver biopsy      Family History  Problem Relation Age of Onset  . Cancer Sister     Social History:  reports that she quit smoking about 11 years ago. Her smoking use included Cigarettes. She has a 5 pack-year smoking history. She has never used smokeless tobacco. She reports that she does not drink alcohol or use illicit drugs.  The patient is accompanied by her daughter.  Allergies: No Known Allergies  Current Medications: Current  Outpatient Prescriptions  Medication Sig Dispense Refill  . amLODipine (NORVASC) 5 MG tablet Take 5 mg by mouth daily.    Marland Kitchen aspirin EC 81 MG tablet Take 81 mg by mouth daily.    Marland Kitchen atorvastatin (LIPITOR) 40 MG tablet Take 40 mg by mouth daily.    Marland Kitchen lidocaine-prilocaine (EMLA) cream Apply 1 application topically as needed. 30 g 1  . lisinopril-hydrochlorothiazide (PRINZIDE,ZESTORETIC) 20-25 MG per tablet Take 1 tablet by mouth daily.    . megestrol (MEGACE) 400 MG/10ML suspension Take 5 mLs (200 mg total) by mouth daily. to stimulate appetite 240 mL 0  . metFORMIN (GLUCOPHAGE) 1000 MG tablet Take 1,000 mg by mouth 2 (two) times daily.     . metoprolol succinate (TOPROL-XL) 25 MG 24 hr tablet Take 25 mg by mouth daily.    . Multiple Vitamin (MULTIVITAMIN WITH MINERALS) TABS tablet Take 1 tablet by mouth daily.    . ondansetron (ZOFRAN) 4 MG tablet Take 1 tablet (4 mg total) by mouth every 8 (eight) hours as needed for nausea or vomiting. 30 tablet 2  . oxyCODONE-acetaminophen (PERCOCET/ROXICET) 5-325 MG tablet Take 1 tablet by mouth every 6 (six) hours as needed for severe pain. 30 tablet 0  . potassium chloride SA (K-DUR,KLOR-CON) 10 MEQ tablet Take 1 tablet (10 mEq total) by mouth once. Take 1 pill twice a day for 3 days then 1 pill a day. 20 tablet 0  . Sennosides-Docusate Sodium (STOOL SOFTENER LAXATIVE PO) Take 1 tablet by mouth daily as  needed (constipation).    Marland Kitchen zolpidem (AMBIEN) 10 MG tablet Take 1 tablet by mouth at bedtime as needed.     No current facility-administered medications for this visit.   Facility-Administered Medications Ordered in Other Visits  Medication Dose Route Frequency Provider Last Rate Last Dose  . acetaminophen (TYLENOL) tablet 650 mg  650 mg Oral Once Lequita Asal, MD   650 mg at 10/19/14 4665  . sodium chloride 0.9 % injection 10 mL  10 mL Intracatheter PRN Lequita Asal, MD   10 mL at 10/11/14 1431    Review of Systems:  GENERAL: Feels good. No  fevers or sweats. Appetite improving, weight up. PERFORMANCE STATUS (ECOG): 2. HEENT: Wears a hearing aide. No visual changes, runny nose, sore throat, mouth sores or tenderness. Lungs: No shortness of breath or cough. No hemoptysis. Cardiac: No chest pain, palpitations, orthopnea, or PND. GI:  Constipation.  No nausea, vomiting, diarrhea, melena or hematochezia. GU: Wears a Depends pad.  No dysuria or hematuria. Musculoskeletal: On and off right sided back pain. No joint pain. No muscle tenderness. Extremities: No pain or swelling. Skin: No rashes, skin lesions, or ulcers Neuro:  Transient oxaliplatin induced cold neuropathy.  No headache, numbness or weakness, or balance issues. Endocrine: No diabetes, thyroid issues, hot flashes or night sweats. Psych: No mood changes, depression or anxiety. Pain: No pain. Review of systems: All other systems reviewed and found to be negative.  Physical Exam: Blood pressure 130/71, pulse 64, temperature 96.2 F (35.7 C), temperature source Tympanic, resp. rate 18, height 5' 5"  (1.651 m), weight 134 lb 7.7 oz (61 kg). GENERAL: Elderly woman sitting comfortably in a wheelchair in the exam room in no acute distress. MENTAL STATUS: Alert and oriented to person, place and time. HEAD: Styled hair. Normocephalic, atraumatic, face symmetric, no Cushingoid features. EYES: Glasses. Hazel/green eyes. Pupils equal round and reactive to light and accomodation. No conjunctivitis or scleral icterus. ENT: Oropharynx clear without lesion. Partial. Tongue normal. Mucous membranes moist.  RESPIRATORY: Clear to auscultation without rales, wheezes or rhonchi. CARDIOVASCULAR: Regular rate and rhythm without murmur, rub or gallop. ABDOMEN: Soft, non-tender with active bowel sounds. No guarding or rebound tenderness. No hepatosplenomegaly. BACK:  No pain on palpation of spine.  Right sided paravertebral muscle tenderness. SKIN: No rashes, ulcer  or skin lesions.  EXTREMITIES: No edema, no skin discoloration or tenderness. No palpable cords. LYMPH NODES: No palpable cervical, supraclavicular, axillary or inguinal adenopathy  NEUROLOGICAL: Unremarkable. PSYCH: Appropriate.  Appointment on 02/12/2015  Component Date Value Ref Range Status  . WBC 02/12/2015 5.4  3.6 - 11.0 K/uL Final  . RBC 02/12/2015 3.46* 3.80 - 5.20 MIL/uL Final  . Hemoglobin 02/12/2015 10.3* 12.0 - 16.0 g/dL Final  . HCT 02/12/2015 31.7* 35.0 - 47.0 % Final  . MCV 02/12/2015 91.6  80.0 - 100.0 fL Final  . MCH 02/12/2015 29.8  26.0 - 34.0 pg Final  . MCHC 02/12/2015 32.5  32.0 - 36.0 g/dL Final  . RDW 02/12/2015 20.3* 11.5 - 14.5 % Final  . Platelets 02/12/2015 195  150 - 440 K/uL Final  . Sodium 02/12/2015 138  135 - 145 mmol/L Final  . Potassium 02/12/2015 3.7  3.5 - 5.1 mmol/L Final  . Chloride 02/12/2015 108  101 - 111 mmol/L Final  . CO2 02/12/2015 22  22 - 32 mmol/L Final  . Glucose, Bld 02/12/2015 152* 65 - 99 mg/dL Final  . BUN 02/12/2015 10  6 - 20 mg/dL Final  .  Creatinine, Ser 02/12/2015 0.79  0.44 - 1.00 mg/dL Final  . Calcium 02/12/2015 9.7  8.9 - 10.3 mg/dL Final  . Total Protein 02/12/2015 6.7  6.5 - 8.1 g/dL Final  . Albumin 02/12/2015 3.0* 3.5 - 5.0 g/dL Final  . AST 02/12/2015 26  15 - 41 U/L Final  . ALT 02/12/2015 11* 14 - 54 U/L Final  . Alkaline Phosphatase 02/12/2015 93  38 - 126 U/L Final  . Total Bilirubin 02/12/2015 0.6  0.3 - 1.2 mg/dL Final  . GFR calc non Af Amer 02/12/2015 >60  >60 mL/min Final  . GFR calc Af Amer 02/12/2015 >60  >60 mL/min Final   Comment: (NOTE) The eGFR has been calculated using the CKD EPI equation. This calculation has not been validated in all clinical situations. eGFR's persistently <60 mL/min signify possible Chronic Kidney Disease.   . Anion gap 02/12/2015 8  5 - 15 Final  . Magnesium 02/12/2015 1.8  1.7 - 2.4 mg/dL Final    Assessment:  VICTORIAH WILDS is a 69 y.o. female with metastatic  colon cancer. She presented with a 53 pound weight loss in 6 months. Colonoscopy on 09/07/2014 revealed a circumferential partially obstructive fungating mass in the descending colon. There was no active bleeding. There was a 1.5 cm pedunculated polyp in the sigmoid colon (tubulovillous adenoma). Pathology from the descending colon mass revealed fragments of adenocarcinoma. There was not enough tissue for KRAS testing.  CEA was 11,793 on 09/05/2014.  Chest CT angiogram on 08/28/2014 revealed no evidence of pulmonary embolism, but multiple large enhancing liver masses (largest 6.4 cm).  PET scan on 10/04/2014 revealed 6.5 x 6.1 cm hypermetabolic mass in the proximal sigmoid colon,  There were  numerous large hypermetabolic hepatic metastases. There were no additional sites of metastatic disease in the neck, chest or skeleton.  In addition, there was a 3.2 x 2.8 cm low-attenuation right thyroid nodule.   She is status post 9 cycles of FOLFOX chemotherapy (10/09/2014 - 01/15/2015).  She declined Avastin.  She is tolerating her chemotherapy well with only a transient cold neuropathy.  CEA was 10,169 on 10/09/2014 , 12,034 on 0712/2016, 7765 on 11/20/2014, 5154 on 12/04/2014, 1921 on 01/01/2015, 1266 on 01/15/2015, and 770.6 on 01/29/2015.  Abdominal and pelvic CT scan on 12/28/2014 revealed response to therapy.  Liver metastases and the descending/sigmoid mass were smaller.  There was no new disease.  Symptomatically, she has mild constipation but is treating with OTC Senna.  She has right sided lumbar paravertebral muscular pain.  Appetite is improving and weight has gone up.  CEA continues to decline.  Plan: 1.  Labs today:  CBC with diff, CMP, Mg, CEA. 2.  Cycle #10 FOLFOX today. 3.  RTC in 2 days for pump disconnect. 4.  Discuss management of constipation and musculoskeletal pain. 5.  Anticipate next restaging studies around 03/29/2015. 6.  Discuss weight and caloric intake. 7.  RTC in 2  weeks for MD assess, labs (CBC with diff, CMP, CEA, Mg), and cycle #11 FOLFOX.   Evlyn Kanner, NP  02/12/2015, 10:37 AM

## 2015-02-13 LAB — CEA: CEA: 502.7 ng/mL — ABNORMAL HIGH (ref 0.0–4.7)

## 2015-02-14 ENCOUNTER — Inpatient Hospital Stay: Payer: Medicare Other

## 2015-02-14 VITALS — BP 119/77 | HR 59 | Temp 95.7°F | Resp 18

## 2015-02-14 DIAGNOSIS — C189 Malignant neoplasm of colon, unspecified: Secondary | ICD-10-CM

## 2015-02-14 DIAGNOSIS — C787 Secondary malignant neoplasm of liver and intrahepatic bile duct: Principal | ICD-10-CM

## 2015-02-14 DIAGNOSIS — Z5111 Encounter for antineoplastic chemotherapy: Secondary | ICD-10-CM | POA: Diagnosis not present

## 2015-02-14 MED ORDER — HEPARIN SOD (PORK) LOCK FLUSH 100 UNIT/ML IV SOLN
INTRAVENOUS | Status: AC
  Filled 2015-02-14: qty 5

## 2015-02-14 MED ORDER — HEPARIN SOD (PORK) LOCK FLUSH 100 UNIT/ML IV SOLN
500.0000 [IU] | Freq: Once | INTRAVENOUS | Status: AC | PRN
  Administered 2015-02-14: 500 [IU]

## 2015-02-14 MED ORDER — SODIUM CHLORIDE 0.9 % IJ SOLN
10.0000 mL | INTRAMUSCULAR | Status: DC | PRN
  Administered 2015-02-14: 10 mL
  Filled 2015-02-14: qty 10

## 2015-02-26 ENCOUNTER — Inpatient Hospital Stay: Payer: Medicare Other

## 2015-02-26 ENCOUNTER — Encounter: Payer: Self-pay | Admitting: Internal Medicine

## 2015-02-26 ENCOUNTER — Other Ambulatory Visit: Payer: Self-pay | Admitting: Hematology and Oncology

## 2015-02-26 ENCOUNTER — Inpatient Hospital Stay (HOSPITAL_BASED_OUTPATIENT_CLINIC_OR_DEPARTMENT_OTHER): Payer: Medicare Other | Admitting: Internal Medicine

## 2015-02-26 VITALS — BP 137/85 | HR 54 | Temp 98.2°F | Resp 18 | Ht 65.0 in | Wt 133.8 lb

## 2015-02-26 DIAGNOSIS — Z7982 Long term (current) use of aspirin: Secondary | ICD-10-CM

## 2015-02-26 DIAGNOSIS — G629 Polyneuropathy, unspecified: Secondary | ICD-10-CM | POA: Diagnosis not present

## 2015-02-26 DIAGNOSIS — D125 Benign neoplasm of sigmoid colon: Secondary | ICD-10-CM

## 2015-02-26 DIAGNOSIS — C787 Secondary malignant neoplasm of liver and intrahepatic bile duct: Principal | ICD-10-CM

## 2015-02-26 DIAGNOSIS — Z87891 Personal history of nicotine dependence: Secondary | ICD-10-CM

## 2015-02-26 DIAGNOSIS — M129 Arthropathy, unspecified: Secondary | ICD-10-CM

## 2015-02-26 DIAGNOSIS — E119 Type 2 diabetes mellitus without complications: Secondary | ICD-10-CM

## 2015-02-26 DIAGNOSIS — I10 Essential (primary) hypertension: Secondary | ICD-10-CM

## 2015-02-26 DIAGNOSIS — C189 Malignant neoplasm of colon, unspecified: Secondary | ICD-10-CM

## 2015-02-26 DIAGNOSIS — G4700 Insomnia, unspecified: Secondary | ICD-10-CM

## 2015-02-26 DIAGNOSIS — K5900 Constipation, unspecified: Secondary | ICD-10-CM

## 2015-02-26 DIAGNOSIS — Z794 Long term (current) use of insulin: Secondary | ICD-10-CM

## 2015-02-26 DIAGNOSIS — Z79899 Other long term (current) drug therapy: Secondary | ICD-10-CM

## 2015-02-26 DIAGNOSIS — E041 Nontoxic single thyroid nodule: Secondary | ICD-10-CM

## 2015-02-26 DIAGNOSIS — Z5111 Encounter for antineoplastic chemotherapy: Secondary | ICD-10-CM | POA: Diagnosis not present

## 2015-02-26 LAB — COMPREHENSIVE METABOLIC PANEL
ALBUMIN: 3 g/dL — AB (ref 3.5–5.0)
ALT: 11 U/L — AB (ref 14–54)
ANION GAP: 7 (ref 5–15)
AST: 21 U/L (ref 15–41)
Alkaline Phosphatase: 97 U/L (ref 38–126)
BUN: 10 mg/dL (ref 6–20)
CHLORIDE: 107 mmol/L (ref 101–111)
CO2: 23 mmol/L (ref 22–32)
CREATININE: 0.85 mg/dL (ref 0.44–1.00)
Calcium: 10 mg/dL (ref 8.9–10.3)
GFR calc non Af Amer: >60 mL/min (ref >60)
GLUCOSE: 124 mg/dL — AB (ref 65–99)
Potassium: 3.7 mmol/L (ref 3.5–5.1)
SODIUM: 137 mmol/L (ref 135–145)
Total Bilirubin: 0.7 mg/dL (ref 0.3–1.2)
Total Protein: 6.9 g/dL (ref 6.5–8.1)

## 2015-02-26 LAB — CBC WITH DIFFERENTIAL/PLATELET
Basophils Absolute: 0 10*3/uL (ref 0–0.1)
Basophils Relative: 1 %
EOS ABS: 0 10*3/uL (ref 0–0.7)
EOS PCT: 1 %
HCT: 34.3 % — ABNORMAL LOW (ref 35.0–47.0)
Hemoglobin: 11.1 g/dL — ABNORMAL LOW (ref 12.0–16.0)
LYMPHS ABS: 1.3 10*3/uL (ref 1.0–3.6)
Lymphocytes Relative: 24 %
MCH: 29.5 pg (ref 26.0–34.0)
MCHC: 32.3 g/dL (ref 32.0–36.0)
MCV: 91.1 fL (ref 80.0–100.0)
MONO ABS: 0.5 10*3/uL (ref 0.2–0.9)
MONOS PCT: 10 %
Neutro Abs: 3.5 10*3/uL (ref 1.4–6.5)
Neutrophils Relative %: 64 %
PLATELETS: 209 10*3/uL (ref 150–440)
RBC: 3.76 MIL/uL — ABNORMAL LOW (ref 3.80–5.20)
RDW: 19.5 % — ABNORMAL HIGH (ref 11.5–14.5)
WBC: 5.5 10*3/uL (ref 3.6–11.0)

## 2015-02-26 MED ORDER — SODIUM CHLORIDE 0.9 % IV SOLN
2400.0000 mg/m2 | INTRAVENOUS | Status: DC
  Administered 2015-02-26: 3900 mg via INTRAVENOUS
  Filled 2015-02-26: qty 78

## 2015-02-26 MED ORDER — HEPARIN SOD (PORK) LOCK FLUSH 100 UNIT/ML IV SOLN: 500.0000 [IU] | Freq: Once | INTRAVENOUS | Status: DC | PRN

## 2015-02-26 MED ORDER — DEXAMETHASONE SODIUM PHOSPHATE 100 MG/10ML IJ SOLN
Freq: Once | INTRAMUSCULAR | Status: AC
  Administered 2015-02-26: 11:00:00 via INTRAVENOUS
  Filled 2015-02-26: qty 4

## 2015-02-26 MED ORDER — OXALIPLATIN CHEMO INJECTION 100 MG/20ML
85.0000 mg/m2 | Freq: Once | INTRAVENOUS | Status: AC
  Administered 2015-02-26: 140 mg via INTRAVENOUS
  Filled 2015-02-26: qty 20

## 2015-02-26 MED ORDER — LEUCOVORIN CALCIUM INJECTION 350 MG
650.0000 mg | Freq: Once | INTRAVENOUS | Status: AC
  Administered 2015-02-26: 650 mg via INTRAVENOUS
  Filled 2015-02-26: qty 25

## 2015-02-26 MED ORDER — SODIUM CHLORIDE 0.9 % IJ SOLN
10.0000 mL | INTRAMUSCULAR | Status: DC | PRN
  Administered 2015-02-26: 10 mL
  Filled 2015-02-26: qty 10

## 2015-02-26 MED ORDER — FLUOROURACIL CHEMO INJECTION 2.5 GM/50ML
400.0000 mg/m2 | Freq: Once | INTRAVENOUS | Status: AC
  Administered 2015-02-26: 650 mg via INTRAVENOUS
  Filled 2015-02-26: qty 13

## 2015-02-26 MED ORDER — DEXTROSE 5 % IV SOLN
Freq: Once | INTRAVENOUS | Status: AC
  Administered 2015-02-26: 11:00:00 via INTRAVENOUS
  Filled 2015-02-26: qty 1000

## 2015-02-26 NOTE — Progress Notes (Signed)
Only complaint is constipation. Eating and drinking good

## 2015-02-26 NOTE — Progress Notes (Signed)
[place] day:  02/26/2015  Chief Complaint: Sandy [name] is a 69 y.o. female with stage IV colon cancer who is seen for assessment prior to cycle #11 FOLFOX chemotherapy.  HPI:  She presented with a 53 pound weight loss in 6 months. Colonoscopy on 09/07/2014 revealed a circumferential partially obstructive fungating mass in the descending colon. There was no active bleeding. There was a 1.5 cm pedunculated polyp in the sigmoid colon (tubulovillous adenoma). Pathology from the descending colon mass revealed fragments of adenocarcinoma. There was not enough tissue for KRAS testing.  CEA was 11,793 on 09/05/2014.  Chest CT angiogram on 08/28/2014 revealed no evidence of pulmonary embolism, but multiple large enhancing liver masses (largest 6.4 cm).  PET scan on 10/04/2014 revealed 6.5 x 6.1 cm hypermetabolic mass in the proximal sigmoid colon,  There were  numerous large hypermetabolic hepatic metastases. There were no additional sites of metastatic disease in the neck, chest or skeleton.  In addition, there was a 3.2 x 2.8 cm low-attenuation right thyroid nodule.   She is status post 10 cycles of FOLFOX chemotherapy (10/09/2014 - 01/15/2015).  She declined Avastin.  She is tolerating her chemotherapy well with only a transient cold neuropathy.  CEA was 10,169 on 10/09/2014 , 12,034 on 0712/2016, 7765 on 11/20/2014, 5154 on 12/04/2014, 1921 on 01/01/2015, 1266 on 01/15/2015, and 770.6 on 01/29/2015.  Abdominal and pelvic CT scan on 12/28/2014 revealed response to therapy.  Liver metastases and the descending/sigmoid mass were smaller.  There was no new disease.    CURRENT STATUS:  Sandy [name] returns to our clinic for follow-up visit prior to initiating cycle #11 of FOLFOX chemotherapy for metastatic colon cancer Clinically she has done fairly well since her last appointment. She is able to tolerate FOLFOX remarkably well with only minimal  numbness and tingling in her fingers after exposure to cold few days after completion of this cycle of FOLFOX. Otherwise, she is able to eat and drink quite well, she is able to walk with a walker. She, however, complains of persistent constipation, which does not improve after prolonged juice or daily senna. She denies nausea, vomiting, diarrhea, excessive pain, fevers, chills, rash. Her daughter, who is a primary caregiver, however, indicate that that Sandy [name] in activities of daily living, and has been moody, avoiding social interactions, especially after completion of each cycle of chemotherapy.  Past Medical History  Diagnosis Date  . Hypertension   . Arthritis   . Constipation   . Insomnia   . Diabetes mellitus without complication ([place])   . Cancer [facility])     liver mets; colon cancer    Past Surgical History  Procedure Laterality Date  . Colonoscopy with propofol N/A 09/07/2014    Procedure: COLONOSCOPY WITH PROPOFOL;  Surgeon: Lucilla Lame, MD;  Location: [facility] ENDOSCOPY;  Service: Endoscopy;  Laterality: N/A;  . Esophagogastroduodenoscopy N/A 09/07/2014    Procedure: ESOPHAGOGASTRODUODENOSCOPY (EGD);  Surgeon: Lucilla Lame, MD;  Location: [facility] ENDOSCOPY;  Service: Endoscopy;  Laterality: N/A;  . Portacath placement Left 09/24/2014    Procedure: INSERTION PORT-A-CATH;  Surgeon: Robert Bellow, MD;  Location: [facility] ORS;  Service: General;  Laterality: Left;  . Liver biopsy      Family History  Problem Relation Age of Onset  . Cancer Sister     Social History:  reports that she quit smoking about 11 years ago. Her smoking use included Cigarettes. She has a  5 pack-year smoking history. She has never used smokeless tobacco. She reports that she does not drink alcohol or use illicit drugs.  The patient is accompanied by her daughter.  Allergies: No Known Allergies  Current Medications: Current Outpatient Prescriptions  Medication Sig Dispense  Refill  . amLODipine (NORVASC) 5 MG tablet Take 5 mg by mouth daily.    Marland Kitchen aspirin EC 81 MG tablet Take 81 mg by mouth daily.    Marland Kitchen atorvastatin (LIPITOR) 40 MG tablet Take 40 mg by mouth daily.    Marland Kitchen lidocaine-prilocaine (EMLA) cream Apply 1 application topically as needed. 30 g 1  . lisinopril-hydrochlorothiazide (PRINZIDE,ZESTORETIC) 20-25 MG per tablet Take 1 tablet by mouth daily.    . megestrol (MEGACE) 400 MG/10ML suspension Take 5 mLs (200 mg total) by mouth daily. to stimulate appetite 240 mL 0  . metFORMIN (GLUCOPHAGE) 1000 MG tablet Take 1,000 mg by mouth 2 (two) times daily.     . metoprolol succinate (TOPROL-XL) 25 MG 24 hr tablet Take 25 mg by mouth daily.    . Multiple Vitamin (MULTIVITAMIN WITH MINERALS) TABS tablet Take 1 tablet by mouth daily.    . ondansetron (ZOFRAN) 4 MG tablet Take 1 tablet (4 mg total) by mouth every 8 (eight) hours as needed for nausea or vomiting. 30 tablet 2  . oxyCODONE-acetaminophen (PERCOCET/ROXICET) 5-325 MG tablet Take 1 tablet by mouth every 6 (six) hours as needed for severe pain. 30 tablet 0  . potassium chloride SA (K-DUR,KLOR-CON) 10 MEQ tablet Take 1 tablet (10 mEq total) by mouth once. Take 1 pill twice a day for 3 days then 1 pill a day. 20 tablet 0  . Sennosides-Docusate Sodium (STOOL SOFTENER LAXATIVE PO) Take 1 tablet by mouth daily as needed (constipation).    Marland Kitchen zolpidem (AMBIEN) 10 MG tablet Take 1 tablet by mouth at bedtime as needed.     No current facility-administered medications for this visit.   Facility-Administered Medications Ordered in Other Visits  Medication Dose Route Frequency Provider Last Rate Last Dose  . acetaminophen (TYLENOL) tablet 650 mg  650 mg Oral Once Lequita Asal, MD   650 mg at 10/19/14 3748  . sodium chloride 0.9 % injection 10 mL  10 mL Intracatheter PRN Lequita Asal, MD   10 mL at 10/11/14 1431    Review of Systems:  GENERAL: Feels good. No fevers or sweats. Appetite improving, weight  up. PERFORMANCE STATUS (ECOG): 2. HEENT: Wears a hearing aide. No visual changes, runny nose, sore throat, mouth sores or tenderness. Lungs: No shortness of breath or cough. No hemoptysis. Cardiac: No chest pain, palpitations, orthopnea, or PND. GI:  Constipation.  No nausea, vomiting, diarrhea, melena or hematochezia. GU: Wears a Depends pad.  No dysuria or hematuria. Musculoskeletal: On and off right sided back pain. No joint pain. No muscle tenderness. Extremities: No pain or swelling. Skin: No rashes, skin lesions, or ulcers Neuro:  Transient oxaliplatin induced cold neuropathy.  No headache, numbness or weakness, or balance issues. Endocrine: No diabetes, thyroid issues, hot flashes or night sweats. Psych: No mood changes, depression or anxiety. Pain: No pain. Review of systems: All other systems reviewed and found to be negative.  Physical Exam: Blood pressure 137/85, pulse 54, temperature 98.2 F (36.8 C), temperature source Tympanic, resp. rate 18, height $RemoveBe'5\' 5"'pndEhdFoe$  (1.651 m), weight 133 lb 13.1 oz (60.7 kg). GENERAL: Elderly African-American  hard of hearing woman sitting comfortably in a wheelchair in the exam room in no acute distress.  MENTAL STATUS: Alert and oriented to person, place and time. HEAD: Styled hair. Normocephalic, atraumatic, face symmetric, no Cushingoid features. EYES: Glasses. Hazel/green eyes. Pupils equal round and reactive to light and accomodation. No conjunctivitis or scleral icterus. ENT: Oropharynx clear without lesion. Partial. Tongue normal. Mucous membranes moist.  RESPIRATORY: Clear to auscultation without rales, wheezes or rhonchi. CARDIOVASCULAR: Regular rate and rhythm without murmur, rub or gallop. ABDOMEN: Soft, non-tender with active bowel sounds. No guarding or rebound tenderness. No hepatosplenomegaly. BACK:  No pain on palpation of spine.  Right sided paravertebral muscle tenderness. SKIN: No rashes, ulcer or skin  lesions. Port-A-Cath site nontender, nonerythematous. EXTREMITIES: No edema, no skin discoloration or tenderness. No palpable cords. LYMPH NODES: No palpable cervical, supraclavicular, axillary or inguinal adenopathy  NEUROLOGICAL: Unremarkable. PSYCH: Appropriate, but somewhat emotional.  Appointment on 02/26/2015  Component Date Value Ref Range Status  . WBC 02/26/2015 5.5  3.6 - 11.0 K/uL Final  . RBC 02/26/2015 3.76* 3.80 - 5.20 MIL/uL Final  . Hemoglobin 02/26/2015 11.1* 12.0 - 16.0 g/dL Final  . HCT 02/26/2015 34.3* 35.0 - 47.0 % Final  . MCV 02/26/2015 91.1  80.0 - 100.0 fL Final  . MCH 02/26/2015 29.5  26.0 - 34.0 pg Final  . MCHC 02/26/2015 32.3  32.0 - 36.0 g/dL Final  . RDW 02/26/2015 19.5* 11.5 - 14.5 % Final  . Platelets 02/26/2015 209  150 - 440 K/uL Final  . Neutrophils Relative % 02/26/2015 64   Final  . Neutro Abs 02/26/2015 3.5  1.4 - 6.5 K/uL Final  . Lymphocytes Relative 02/26/2015 24   Final  . Lymphs Abs 02/26/2015 1.3  1.0 - 3.6 K/uL Final  . Monocytes Relative 02/26/2015 10   Final  . Monocytes Absolute 02/26/2015 0.5  0.2 - 0.9 K/uL Final  . Eosinophils Relative 02/26/2015 1   Final  . Eosinophils Absolute 02/26/2015 0.0  0 - 0.7 K/uL Final  . Basophils Relative 02/26/2015 1   Final  . Basophils Absolute 02/26/2015 0.0  0 - 0.1 K/uL Final  . Sodium 02/26/2015 137  135 - 145 mmol/L Final  . Potassium 02/26/2015 3.7  3.5 - 5.1 mmol/L Final  . Chloride 02/26/2015 107  101 - 111 mmol/L Final  . CO2 02/26/2015 23  22 - 32 mmol/L Final  . Glucose, Bld 02/26/2015 124* 65 - 99 mg/dL Final  . BUN 02/26/2015 10  6 - 20 mg/dL Final  . Creatinine, Ser 02/26/2015 0.85  0.44 - 1.00 mg/dL Final  . Calcium 02/26/2015 10.0  8.9 - 10.3 mg/dL Final  . Total Protein 02/26/2015 6.9  6.5 - 8.1 g/dL Final  . Albumin 02/26/2015 3.0* 3.5 - 5.0 g/dL Final  . AST 02/26/2015 21  15 - 41 U/L Final  . ALT 02/26/2015 11* 14 - 54 U/L Final  . Alkaline Phosphatase 02/26/2015 97  38 -  126 U/L Final  . Total Bilirubin 02/26/2015 0.7  0.3 - 1.2 mg/dL Final  . GFR calc non Af Amer 02/26/2015 >60  >60 mL/min Final  . GFR calc Af Amer 02/26/2015 >60  >60 mL/min Final   Comment: (NOTE) The eGFR has been calculated using the CKD EPI equation. This calculation has not been validated in all clinical situations. eGFR's persistently <60 mL/min signify possible Chronic Kidney Disease.   Georgiann Hahn gap 02/26/2015 7  5 - 15 Final    Assessment:  BREZLYN MANRIQUE is a 69 y.o. female with metastatic colon cancer.   1. Stage IV colon  cancer, K-ras status unknown - Sandy [name] has done remarkably well on FOLFOX therapy after 10 cycles. She only has minimal neuropathy, which does not require any treatment, except for avoidance of cold exposure after completion of therapy. She does not appear to have any significant pain at this point. We will complete 12 cycles of treatment and do restaging scan.  2. Constipation-in the past constipation was well controlled by twice a day senna, so we discussed the need to increase senna to twice a day, and if needed, 2 capsules twice a day. If she does not have a bowel movement after 3 days of consistent treatment with senna, she shouldn't take sorbitol every 8 hours until bowel movement. 3. Emotional support-had a very long conversation with Sandy [name] and her daughter, during which we underlined the fact that it is very important to involve as many people as possible in her daily life, which should improve her mood and overall level of activity. Currently her daughter is the sole care provider, and it appears to place a significant burden on her. We discussed available sources for a caregiver in this situation, and will investigate the availability of a support group for cancer patients and providers alike inoperable hospital.   Plan: 1.  Labs today:  CBC with diff, CMP, Mg, CEA. 2.  Cycle #11 FOLFOX today. 3.  RTC in 2 days for pump disconnect. 4.  Anticipate next restaging studies around 03/29/2015. 5.  RTC in 2 weeks for MD assess, labs (CBC with diff, CMP, CEA, Mg), and cycle #12 FOLFOX.   Roxana Hires, MD  02/26/2015, 9:24 AM

## 2015-02-27 LAB — CEA: CEA: 368.7 ng/mL — ABNORMAL HIGH (ref 0.0–4.7)

## 2015-02-27 MED ORDER — HEPARIN SOD (PORK) LOCK FLUSH 100 UNIT/ML IV SOLN
INTRAVENOUS | Status: AC
  Filled 2015-02-27: qty 5

## 2015-02-28 ENCOUNTER — Inpatient Hospital Stay: Payer: Medicare Other

## 2015-02-28 VITALS — BP 153/76 | HR 54 | Temp 98.0°F | Resp 18

## 2015-02-28 DIAGNOSIS — C787 Secondary malignant neoplasm of liver and intrahepatic bile duct: Principal | ICD-10-CM

## 2015-02-28 DIAGNOSIS — C189 Malignant neoplasm of colon, unspecified: Secondary | ICD-10-CM

## 2015-02-28 DIAGNOSIS — Z5111 Encounter for antineoplastic chemotherapy: Secondary | ICD-10-CM | POA: Diagnosis not present

## 2015-02-28 MED ORDER — SODIUM CHLORIDE 0.9 % IJ SOLN
10.0000 mL | INTRAMUSCULAR | Status: AC | PRN
  Administered 2015-02-28: 10 mL
  Filled 2015-02-28: qty 10

## 2015-02-28 MED ORDER — HEPARIN SOD (PORK) LOCK FLUSH 100 UNIT/ML IV SOLN
500.0000 [IU] | Freq: Once | INTRAVENOUS | Status: AC | PRN
  Administered 2015-02-28: 500 [IU]
  Filled 2015-02-28: qty 5

## 2015-03-11 ENCOUNTER — Other Ambulatory Visit: Payer: Self-pay | Admitting: *Deleted

## 2015-03-11 MED ORDER — POTASSIUM CHLORIDE CRYS ER 10 MEQ PO TBCR: 10.0000 meq | EXTENDED_RELEASE_TABLET | Freq: Once | ORAL | Status: DC

## 2015-03-11 NOTE — Telephone Encounter (Signed)
Last potassium 11/15 3.7

## 2015-03-12 ENCOUNTER — Other Ambulatory Visit: Payer: Self-pay | Admitting: Hematology and Oncology

## 2015-03-12 ENCOUNTER — Other Ambulatory Visit: Payer: Self-pay

## 2015-03-12 DIAGNOSIS — C189 Malignant neoplasm of colon, unspecified: Secondary | ICD-10-CM

## 2015-03-12 DIAGNOSIS — C787 Secondary malignant neoplasm of liver and intrahepatic bile duct: Principal | ICD-10-CM

## 2015-03-14 ENCOUNTER — Inpatient Hospital Stay (HOSPITAL_BASED_OUTPATIENT_CLINIC_OR_DEPARTMENT_OTHER): Payer: Medicare Other | Admitting: Hematology and Oncology

## 2015-03-14 ENCOUNTER — Inpatient Hospital Stay: Payer: Medicare Other | Attending: Hematology and Oncology

## 2015-03-14 ENCOUNTER — Inpatient Hospital Stay: Payer: Medicare Other

## 2015-03-14 VITALS — BP 117/75 | HR 56 | Temp 97.1°F | Resp 18 | Ht 65.0 in | Wt 129.2 lb

## 2015-03-14 DIAGNOSIS — E119 Type 2 diabetes mellitus without complications: Secondary | ICD-10-CM | POA: Diagnosis not present

## 2015-03-14 DIAGNOSIS — M2190 Unspecified acquired deformity of unspecified limb: Secondary | ICD-10-CM

## 2015-03-14 DIAGNOSIS — G629 Polyneuropathy, unspecified: Secondary | ICD-10-CM | POA: Diagnosis not present

## 2015-03-14 DIAGNOSIS — Z87891 Personal history of nicotine dependence: Secondary | ICD-10-CM

## 2015-03-14 DIAGNOSIS — C189 Malignant neoplasm of colon, unspecified: Secondary | ICD-10-CM | POA: Diagnosis not present

## 2015-03-14 DIAGNOSIS — Z7984 Long term (current) use of oral hypoglycemic drugs: Secondary | ICD-10-CM

## 2015-03-14 DIAGNOSIS — K5900 Constipation, unspecified: Secondary | ICD-10-CM | POA: Insufficient documentation

## 2015-03-14 DIAGNOSIS — E041 Nontoxic single thyroid nodule: Secondary | ICD-10-CM | POA: Insufficient documentation

## 2015-03-14 DIAGNOSIS — G4700 Insomnia, unspecified: Secondary | ICD-10-CM | POA: Diagnosis not present

## 2015-03-14 DIAGNOSIS — M129 Arthropathy, unspecified: Secondary | ICD-10-CM | POA: Diagnosis not present

## 2015-03-14 DIAGNOSIS — Z5111 Encounter for antineoplastic chemotherapy: Secondary | ICD-10-CM | POA: Insufficient documentation

## 2015-03-14 DIAGNOSIS — D125 Benign neoplasm of sigmoid colon: Secondary | ICD-10-CM | POA: Diagnosis not present

## 2015-03-14 DIAGNOSIS — Z79899 Other long term (current) drug therapy: Secondary | ICD-10-CM | POA: Diagnosis not present

## 2015-03-14 DIAGNOSIS — Z7982 Long term (current) use of aspirin: Secondary | ICD-10-CM | POA: Diagnosis not present

## 2015-03-14 DIAGNOSIS — I10 Essential (primary) hypertension: Secondary | ICD-10-CM | POA: Diagnosis not present

## 2015-03-14 DIAGNOSIS — C787 Secondary malignant neoplasm of liver and intrahepatic bile duct: Secondary | ICD-10-CM | POA: Insufficient documentation

## 2015-03-14 LAB — COMPREHENSIVE METABOLIC PANEL
ALT: 24 U/L (ref 14–54)
AST: 44 U/L — ABNORMAL HIGH (ref 15–41)
Albumin: 2.8 g/dL — ABNORMAL LOW (ref 3.5–5.0)
Alkaline Phosphatase: 97 U/L (ref 38–126)
Anion gap: 10 (ref 5–15)
BUN: 11 mg/dL (ref 6–20)
CO2: 20 mmol/L — ABNORMAL LOW (ref 22–32)
Calcium: 9.6 mg/dL (ref 8.9–10.3)
Chloride: 106 mmol/L (ref 101–111)
Creatinine, Ser: 0.97 mg/dL (ref 0.44–1.00)
GFR calc Af Amer: >60 mL/min (ref >60)
GFR calc non Af Amer: 57 mL/min — ABNORMAL LOW (ref >60)
Glucose, Bld: 206 mg/dL — ABNORMAL HIGH (ref 65–99)
Potassium: 3.5 mmol/L (ref 3.5–5.1)
Sodium: 136 mmol/L (ref 135–145)
Total Bilirubin: 0.7 mg/dL (ref 0.3–1.2)
Total Protein: 6.9 g/dL (ref 6.5–8.1)

## 2015-03-14 LAB — CBC WITH DIFFERENTIAL/PLATELET
Basophils Absolute: 0 10*3/uL (ref 0–0.1)
Basophils Relative: 0 %
Eosinophils Absolute: 0 10*3/uL (ref 0–0.7)
Eosinophils Relative: 1 %
HCT: 33.1 % — ABNORMAL LOW (ref 35.0–47.0)
Hemoglobin: 10.8 g/dL — ABNORMAL LOW (ref 12.0–16.0)
Lymphocytes Relative: 18 %
Lymphs Abs: 1 10*3/uL (ref 1.0–3.6)
MCH: 29.8 pg (ref 26.0–34.0)
MCHC: 32.7 g/dL (ref 32.0–36.0)
MCV: 91.1 fL (ref 80.0–100.0)
Monocytes Absolute: 0.7 10*3/uL (ref 0.2–0.9)
Monocytes Relative: 12 %
Neutro Abs: 3.8 10*3/uL (ref 1.4–6.5)
Neutrophils Relative %: 69 %
Platelets: 301 10*3/uL (ref 150–440)
RBC: 3.63 MIL/uL — ABNORMAL LOW (ref 3.80–5.20)
RDW: 19.7 % — ABNORMAL HIGH (ref 11.5–14.5)
WBC: 5.6 10*3/uL (ref 3.6–11.0)

## 2015-03-14 LAB — MAGNESIUM: Magnesium: 1.8 mg/dL (ref 1.7–2.4)

## 2015-03-14 MED ORDER — OXALIPLATIN CHEMO INJECTION 100 MG/20ML
85.0000 mg/m2 | Freq: Once | INTRAVENOUS | Status: AC
  Administered 2015-03-14: 140 mg via INTRAVENOUS
  Filled 2015-03-14: qty 20

## 2015-03-14 MED ORDER — SODIUM CHLORIDE 0.9 % IV SOLN
Freq: Once | INTRAVENOUS | Status: AC
  Administered 2015-03-14: 10:00:00 via INTRAVENOUS
  Filled 2015-03-14: qty 4

## 2015-03-14 MED ORDER — DEXTROSE 5 % IV SOLN
Freq: Once | INTRAVENOUS | Status: AC
  Administered 2015-03-14: 10:00:00 via INTRAVENOUS
  Filled 2015-03-14: qty 1000

## 2015-03-14 MED ORDER — FLUOROURACIL CHEMO INJECTION 2.5 GM/50ML
400.0000 mg/m2 | Freq: Once | INTRAVENOUS | Status: AC
  Administered 2015-03-14: 650 mg via INTRAVENOUS
  Filled 2015-03-14: qty 13

## 2015-03-14 MED ORDER — HEPARIN SOD (PORK) LOCK FLUSH 100 UNIT/ML IV SOLN: 500.0000 [IU] | Freq: Once | INTRAVENOUS | Status: DC | PRN

## 2015-03-14 MED ORDER — SODIUM CHLORIDE 0.9 % IJ SOLN
10.0000 mL | INTRAMUSCULAR | Status: DC | PRN
  Administered 2015-03-14: 10 mL
  Filled 2015-03-14: qty 10

## 2015-03-14 MED ORDER — LEUCOVORIN CALCIUM INJECTION 350 MG
650.0000 mg | Freq: Once | INTRAVENOUS | Status: AC
  Administered 2015-03-14: 650 mg via INTRAVENOUS
  Filled 2015-03-14: qty 25

## 2015-03-14 MED ORDER — SODIUM CHLORIDE 0.9 % IV SOLN
2400.0000 mg/m2 | INTRAVENOUS | Status: DC
  Administered 2015-03-14: 3900 mg via INTRAVENOUS
  Filled 2015-03-14: qty 78

## 2015-03-14 NOTE — Progress Notes (Signed)
[place] [facility] day:  03/14/2015  Chief Complaint: Sandy [name] is a 69 y.o. female with stage IV colon cancer who is seen for assessment prior to cycle #12 FOLFOX chemotherapy.  HPI: The patient was last seen in the medical oncology clinic on 01/29/2015.  At that time, she was seen for assessment prior to cycle #9 FOLFOX chemotherapy.  Symptomatically , she was doing well.  She had some on and off right sided back pain.  She had constipation for which she is using Senna.  She only noted a transient cold neuropathy with chemotherapy.  She was seen by Georgeanne Nim, NP on 02/12/2015 in my absence.  She felt well.  Appetite had increased.  She was gaining weight.  CEA had decreased from  770.6 to 502.7.  She received cycle #10 FOLFOX chemotherapy.  She was seen by Dr. Rolin Barry on 02/26/2015 for cycle #11 FOLFOX chemotherapy.  She only noted a transient cold neuropathy.  She had some constipation for which Senna was increased.  CEA was 368.7.  During the interim, she notes no new complaints.  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 She has constipation for which she is managing with Senna. She states that she in is independent in most tasks (dress, bathroom, bath).  She can make a sandwich.  Her daughter does most of the cooking and cleans the house.  Her daughter sets out her medications.  Her daughter has a different perspective on things (daughter does everything and is overwhelmed).  Past Medical History  Diagnosis Date  . Hypertension   . Arthritis   . Constipation   . Insomnia   . Diabetes mellitus without complication ([place])   . Cancer [facility])     liver mets; colon cancer    Past Surgical History  Procedure Laterality Date  . Colonoscopy with propofol N/A 09/07/2014    Procedure: COLONOSCOPY WITH PROPOFOL;  Surgeon: Lucilla Lame, MD;  Location: [facility] ENDOSCOPY;  Service: Endoscopy;  Laterality: N/A;  . Esophagogastroduodenoscopy N/A  09/07/2014    Procedure: ESOPHAGOGASTRODUODENOSCOPY (EGD);  Surgeon: Lucilla Lame, MD;  Location: [facility] ENDOSCOPY;  Service: Endoscopy;  Laterality: N/A;  . Portacath placement Left 09/24/2014    Procedure: INSERTION PORT-A-CATH;  Surgeon: Robert Bellow, MD;  Location: [facility] ORS;  Service: General;  Laterality: Left;  . Liver biopsy      Family History  Problem Relation Age of Onset  . Cancer Sister     Social History:  reports that she quit smoking about 11 years ago. Her smoking use included Cigarettes. She has a 5 pack-year smoking history. She has never used smokeless tobacco. She reports that she does not drink alcohol or use illicit drugs.  The patient is accompanied by her daughter.  Her daughter is tearful.  Allergies: No Known Allergies  Current Medications: Current Outpatient Prescriptions  Medication Sig Dispense Refill  . amLODipine (NORVASC) 5 MG tablet Take 5 mg by mouth daily.    Marland Kitchen aspirin EC 81 MG tablet Take 81 mg by mouth daily.    Marland Kitchen atorvastatin (LIPITOR) 40 MG tablet Take 40 mg by mouth daily.    Marland Kitchen lidocaine-prilocaine (EMLA) cream Apply 1 application topically as needed. 30 g 1  . lisinopril-hydrochlorothiazide (PRINZIDE,ZESTORETIC) 20-25 MG per tablet Take 1 tablet by mouth daily.    . megestrol (MEGACE) 400 MG/10ML suspension Take 5 mLs (200 mg total) by mouth daily. to stimulate appetite 240 mL 0  . metFORMIN (GLUCOPHAGE) 1000 MG  tablet Take 1,000 mg by mouth 2 (two) times daily.     . metoprolol succinate (TOPROL-XL) 25 MG 24 hr tablet Take 25 mg by mouth daily.    . Multiple Vitamin (MULTIVITAMIN WITH MINERALS) TABS tablet Take 1 tablet by mouth daily.    . ondansetron (ZOFRAN) 4 MG tablet Take 1 tablet (4 mg total) by mouth every 8 (eight) hours as needed for nausea or vomiting. 30 tablet 2  . oxyCODONE-acetaminophen (PERCOCET/ROXICET) 5-325 MG tablet Take 1 tablet by mouth every 6 (six) hours as needed for severe pain. 30 tablet 0  . potassium chloride  (K-DUR,KLOR-CON) 10 MEQ tablet Take 1 tablet (10 mEq total) by mouth once. Take 1 pill twice a day for 3 days then 1 pill a day. 30 tablet 0  . Sennosides-Docusate Sodium (STOOL SOFTENER LAXATIVE PO) Take 1 tablet by mouth 2 (two) times daily.     Marland Kitchen zolpidem (AMBIEN) 10 MG tablet Take 1 tablet by mouth at bedtime as needed.     No current facility-administered medications for this visit.   Facility-Administered Medications Ordered in Other Visits  Medication Dose Route Frequency Provider Last Rate Last Dose  . acetaminophen (TYLENOL) tablet 650 mg  650 mg Oral Once Lequita Asal, MD   650 mg at 10/19/14 0626  . sodium chloride 0.9 % injection 10 mL  10 mL Intracatheter PRN Lequita Asal, MD   10 mL at 10/11/14 1431  . sodium chloride 0.9 % injection 10 mL  10 mL Intracatheter PRN Lequita Asal, MD   10 mL at 02/28/15 1529    Review of Systems:  GENERAL: Feels fine. No fevers or sweats.  Weight down 4 pounds in 2 weeks (fluctuates 125 to 134 pounds). PERFORMANCE STATUS (ECOG): 2. HEENT: Wears a hearing aide. No visual changes, runny nose, sore throat, mouth sores or tenderness. Lungs: No shortness of breath or cough. No hemoptysis. Cardiac: No chest pain, palpitations, orthopnea, or PND. GI:  Constipation on Senna.  No nausea, vomiting, diarrhea, melena or hematochezia. GU: Wears a Depends pad.  No dysuria or hematuria. Musculoskeletal: Denies back pain. No joint pain. No muscle tenderness. Extremities: No pain or swelling. Skin: No rashes, skin lesions, or ulcers Neuro:  Transient oxaliplatin induced cold neuropathy (no change).  No headache, numbness or weakness, or balance issues. Endocrine: No diabetes, thyroid issues, hot flashes or night sweats. Psych: No mood changes, depression or anxiety. Pain: No pain. Review of systems: All other systems reviewed and found to be negative.  Physical Exam: Blood pressure 117/75, pulse 56, temperature 97.1 F (36.2  C), temperature source Tympanic, resp. rate 18, height 5' 5" (1.651 m), weight 129 lb 3 oz (58.6 kg). GENERAL: Elderly woman sitting comfortably in a wheelchair in the exam room in no acute distress. Mobile. MENTAL STATUS: Alert and oriented to person, place and time. HEAD: Styled brown hair. Normocephalic, atraumatic, face symmetric, no Cushingoid features. EYES: Glasses. Hazel/green eyes. Pupils equal round and reactive to light and accomodation. No conjunctivitis or scleral icterus. ENT: Oropharynx clear without lesion. Partial. Tongue normal. Mucous membranes moist.  RESPIRATORY: Clear to auscultation without rales, wheezes or rhonchi. CARDIOVASCULAR: Regular rate and rhythm without murmur, rub or gallop. ABDOMEN: Soft, non-tender with active bowel sounds. No guarding or rebound tenderness. No hepatosplenomegaly. SKIN: No rashes, ulcer or skin lesions.  EXTREMITIES: No edema, no skin discoloration or tenderness. No palpable cords. LYMPH NODES: No palpable cervical, supraclavicular, axillary or inguinal adenopathy  NEUROLOGICAL: Unremarkable. PSYCH: Appropriate.  Appointment on 03/14/2015  Component Date Value Ref Range Status  . WBC 03/14/2015 5.6  3.6 - 11.0 K/uL Final  . RBC 03/14/2015 3.63* 3.80 - 5.20 MIL/uL Final  . Hemoglobin 03/14/2015 10.8* 12.0 - 16.0 g/dL Final  . HCT 03/14/2015 33.1* 35.0 - 47.0 % Final  . MCV 03/14/2015 91.1  80.0 - 100.0 fL Final  . MCH 03/14/2015 29.8  26.0 - 34.0 pg Final  . MCHC 03/14/2015 32.7  32.0 - 36.0 g/dL Final  . RDW 03/14/2015 19.7* 11.5 - 14.5 % Final  . Platelets 03/14/2015 301  150 - 440 K/uL Final  . Neutrophils Relative % 03/14/2015 69   Final  . Neutro Abs 03/14/2015 3.8  1.4 - 6.5 K/uL Final  . Lymphocytes Relative 03/14/2015 18   Final  . Lymphs Abs 03/14/2015 1.0  1.0 - 3.6 K/uL Final  . Monocytes Relative 03/14/2015 12   Final  . Monocytes Absolute 03/14/2015 0.7  0.2 - 0.9 K/uL Final  . Eosinophils Relative  03/14/2015 1   Final  . Eosinophils Absolute 03/14/2015 0.0  0 - 0.7 K/uL Final  . Basophils Relative 03/14/2015 0   Final  . Basophils Absolute 03/14/2015 0.0  0 - 0.1 K/uL Final    Assessment:  Sandy [name] is a 69 y.o. female with metastatic colon cancer. She presented with a 53 pound weight loss in 6 months. Colonoscopy on 09/07/2014 revealed a circumferential partially obstructive fungating mass in the descending colon. There was no active bleeding. There was a 1.5 cm pedunculated polyp in the sigmoid colon (tubulovillous adenoma). Pathology from the descending colon mass revealed fragments of adenocarcinoma. There was not enough tissue for KRAS testing.  CEA was 11,793 on 09/05/2014.  Chest CT angiogram on 08/28/2014 revealed no evidence of pulmonary embolism, but multiple large enhancing liver masses (largest 6.4 cm).  PET scan on 10/04/2014 revealed 6.5 x 6.1 cm hypermetabolic mass in the proximal sigmoid colon,  There were  numerous large hypermetabolic hepatic metastases. There were no additional sites of metastatic disease in the neck, chest or skeleton.  In addition, there was a 3.2 x 2.8 cm low-attenuation right thyroid nodule.   She is status post 11 cycles of FOLFOX chemotherapy (10/09/2014 - 02/26/2015).  She declined Avastin.  She is tolerating her chemotherapy well with only a transient cold neuropathy.  CEA was 10,169 on 10/09/2014 , 12,034 on 0712/2016, 7765 on 11/20/2014, 5154 on 12/04/2014, 1921 on 01/01/2015, 1266 on 01/15/2015, 770.6 on 01/29/2015, 502.7 on 02/12/2015, and 368.7 on 02/26/2015.  Abdominal and pelvic CT scan on 12/28/2014 revealed response to therapy.  Liver metastases and the descending/sigmoid mass were smaller.  There was no new disease.  Symptomatically, she has constipation.  Weight is up and down.  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  There is a discrepancy between what the daughter and patient believes is her functional status.  CEA  continues to decline.  Plan: 1.  Labs today:  CBC with diff, CMP, Mg, CEA. 2.  Cycle #12 FOLFOX today. 3.  RTC in 2 days for pump disconnect. 4.  Discuss management of constipation. 5.  Schedule abdominal/pelvic CT scan on 03/25/2015. 6.  Schedule evaluation with home heath regarding nursing needs. 7.  RTC in 2 weeks for MD assess, labs (CBC with diff, CMP, CEA), review of CT scans, and cycle #13 FOLFOX.   Lequita Asal, MD  03/14/2015, 8:49 AM

## 2015-03-15 ENCOUNTER — Telehealth: Payer: Self-pay | Admitting: *Deleted

## 2015-03-15 LAB — CEA: CEA: 269 ng/mL — ABNORMAL HIGH (ref 0.0–4.7)

## 2015-03-15 MED ORDER — OXYCODONE-ACETAMINOPHEN 5-325 MG PO TABS: 1.0000 | ORAL_TABLET | Freq: Four times a day (QID) | ORAL | Status: DC | PRN

## 2015-03-15 NOTE — Telephone Encounter (Signed)
Informed that prescription is ready to pick up  

## 2015-03-16 ENCOUNTER — Inpatient Hospital Stay: Payer: Medicare Other

## 2015-03-16 VITALS — BP 128/77 | HR 60 | Temp 97.2°F | Resp 18

## 2015-03-16 DIAGNOSIS — Z5111 Encounter for antineoplastic chemotherapy: Secondary | ICD-10-CM | POA: Diagnosis not present

## 2015-03-16 DIAGNOSIS — C787 Secondary malignant neoplasm of liver and intrahepatic bile duct: Principal | ICD-10-CM

## 2015-03-16 DIAGNOSIS — C189 Malignant neoplasm of colon, unspecified: Secondary | ICD-10-CM

## 2015-03-16 MED ORDER — HEPARIN SOD (PORK) LOCK FLUSH 100 UNIT/ML IV SOLN
500.0000 [IU] | Freq: Once | INTRAVENOUS | Status: AC | PRN
  Administered 2015-03-16: 500 [IU]

## 2015-03-16 MED ORDER — SODIUM CHLORIDE 0.9 % IJ SOLN
10.0000 mL | INTRAMUSCULAR | Status: DC | PRN
  Administered 2015-03-16: 10 mL
  Filled 2015-03-16: qty 10

## 2015-03-19 ENCOUNTER — Other Ambulatory Visit: Payer: Self-pay

## 2015-03-25 ENCOUNTER — Ambulatory Visit
Admission: RE | Admit: 2015-03-25 | Discharge: 2015-03-25 | Disposition: A | Discharge status: Home / Self Care | Payer: Medicare Other | Source: Ambulatory Visit | Attending: Hematology and Oncology | Admitting: Hematology and Oncology

## 2015-03-25 DIAGNOSIS — C189 Malignant neoplasm of colon, unspecified: Secondary | ICD-10-CM | POA: Insufficient documentation

## 2015-03-25 DIAGNOSIS — I2510 Atherosclerotic heart disease of native coronary artery without angina pectoris: Secondary | ICD-10-CM | POA: Diagnosis not present

## 2015-03-25 DIAGNOSIS — C787 Secondary malignant neoplasm of liver and intrahepatic bile duct: Secondary | ICD-10-CM | POA: Insufficient documentation

## 2015-03-25 MED ORDER — IOHEXOL 350 MG/ML SOLN
100.0000 mL | Freq: Once | INTRAVENOUS | Status: AC | PRN
  Administered 2015-03-25: 100 mL via INTRAVENOUS

## 2015-03-28 ENCOUNTER — Inpatient Hospital Stay: Payer: Medicare Other

## 2015-03-28 ENCOUNTER — Encounter: Payer: Self-pay | Admitting: Hematology and Oncology

## 2015-03-28 ENCOUNTER — Inpatient Hospital Stay (HOSPITAL_BASED_OUTPATIENT_CLINIC_OR_DEPARTMENT_OTHER): Payer: Medicare Other | Admitting: Hematology and Oncology

## 2015-03-28 ENCOUNTER — Other Ambulatory Visit: Payer: Self-pay

## 2015-03-28 ENCOUNTER — Other Ambulatory Visit: Payer: Self-pay | Admitting: Hematology and Oncology

## 2015-03-28 VITALS — BP 148/85 | HR 56 | Temp 96.5°F | Resp 18 | Ht 65.0 in | Wt 137.8 lb

## 2015-03-28 DIAGNOSIS — C189 Malignant neoplasm of colon, unspecified: Secondary | ICD-10-CM | POA: Diagnosis not present

## 2015-03-28 DIAGNOSIS — E119 Type 2 diabetes mellitus without complications: Secondary | ICD-10-CM

## 2015-03-28 DIAGNOSIS — C787 Secondary malignant neoplasm of liver and intrahepatic bile duct: Principal | ICD-10-CM

## 2015-03-28 DIAGNOSIS — I10 Essential (primary) hypertension: Secondary | ICD-10-CM

## 2015-03-28 DIAGNOSIS — K5900 Constipation, unspecified: Secondary | ICD-10-CM | POA: Diagnosis not present

## 2015-03-28 DIAGNOSIS — G629 Polyneuropathy, unspecified: Secondary | ICD-10-CM

## 2015-03-28 DIAGNOSIS — D125 Benign neoplasm of sigmoid colon: Secondary | ICD-10-CM

## 2015-03-28 DIAGNOSIS — Z79899 Other long term (current) drug therapy: Secondary | ICD-10-CM

## 2015-03-28 DIAGNOSIS — G4700 Insomnia, unspecified: Secondary | ICD-10-CM

## 2015-03-28 DIAGNOSIS — E041 Nontoxic single thyroid nodule: Secondary | ICD-10-CM

## 2015-03-28 DIAGNOSIS — M129 Arthropathy, unspecified: Secondary | ICD-10-CM

## 2015-03-28 DIAGNOSIS — Z5111 Encounter for antineoplastic chemotherapy: Secondary | ICD-10-CM | POA: Diagnosis not present

## 2015-03-28 DIAGNOSIS — Z7984 Long term (current) use of oral hypoglycemic drugs: Secondary | ICD-10-CM

## 2015-03-28 DIAGNOSIS — Z87891 Personal history of nicotine dependence: Secondary | ICD-10-CM

## 2015-03-28 DIAGNOSIS — Z7982 Long term (current) use of aspirin: Secondary | ICD-10-CM

## 2015-03-28 LAB — COMPREHENSIVE METABOLIC PANEL
ALBUMIN: 3.1 g/dL — AB (ref 3.5–5.0)
ALK PHOS: 105 U/L (ref 38–126)
ALT: 11 U/L — ABNORMAL LOW (ref 14–54)
ANION GAP: 8 (ref 5–15)
AST: 20 U/L (ref 15–41)
BUN: 9 mg/dL (ref 6–20)
CALCIUM: 9.9 mg/dL (ref 8.9–10.3)
CHLORIDE: 108 mmol/L (ref 101–111)
CO2: 22 mmol/L (ref 22–32)
Creatinine, Ser: 0.7 mg/dL (ref 0.44–1.00)
GFR calc Af Amer: >60 mL/min (ref >60)
GFR calc non Af Amer: >60 mL/min (ref >60)
GLUCOSE: 169 mg/dL — AB (ref 65–99)
Potassium: 3.8 mmol/L (ref 3.5–5.1)
SODIUM: 138 mmol/L (ref 135–145)
Total Bilirubin: 0.6 mg/dL (ref 0.3–1.2)
Total Protein: 6.6 g/dL (ref 6.5–8.1)

## 2015-03-28 LAB — CBC WITH DIFFERENTIAL/PLATELET
BASOS PCT: 1 %
Basophils Absolute: 0 10*3/uL (ref 0–0.1)
EOS ABS: 0 10*3/uL (ref 0–0.7)
Eosinophils Relative: 1 %
HCT: 32.9 % — ABNORMAL LOW (ref 35.0–47.0)
HEMOGLOBIN: 10.8 g/dL — AB (ref 12.0–16.0)
Lymphocytes Relative: 23 %
Lymphs Abs: 1.2 10*3/uL (ref 1.0–3.6)
MCH: 29.9 pg (ref 26.0–34.0)
MCHC: 32.9 g/dL (ref 32.0–36.0)
MCV: 91 fL (ref 80.0–100.0)
MONOS PCT: 12 %
Monocytes Absolute: 0.6 10*3/uL (ref 0.2–0.9)
NEUTROS PCT: 63 %
Neutro Abs: 3.2 10*3/uL (ref 1.4–6.5)
PLATELETS: 200 10*3/uL (ref 150–440)
RBC: 3.61 MIL/uL — AB (ref 3.80–5.20)
RDW: 19.7 % — ABNORMAL HIGH (ref 11.5–14.5)
WBC: 5 10*3/uL (ref 3.6–11.0)

## 2015-03-28 MED ORDER — SODIUM CHLORIDE 0.9 % IV SOLN
2400.0000 mg/m2 | INTRAVENOUS | Status: DC
  Administered 2015-03-28: 3900 mg via INTRAVENOUS
  Filled 2015-03-28: qty 78

## 2015-03-28 MED ORDER — SODIUM CHLORIDE 0.9 % IJ SOLN
10.0000 mL | INTRAMUSCULAR | Status: DC | PRN
  Administered 2015-03-28: 10 mL
  Filled 2015-03-28: qty 10

## 2015-03-28 MED ORDER — OXALIPLATIN CHEMO INJECTION 100 MG/20ML
85.0000 mg/m2 | Freq: Once | INTRAVENOUS | Status: AC
  Administered 2015-03-28: 140 mg via INTRAVENOUS
  Filled 2015-03-28: qty 20

## 2015-03-28 MED ORDER — HEPARIN SOD (PORK) LOCK FLUSH 100 UNIT/ML IV SOLN
500.0000 [IU] | Freq: Once | INTRAVENOUS | Status: DC | PRN
  Filled 2015-03-28: qty 5

## 2015-03-28 MED ORDER — SODIUM CHLORIDE 0.9 % IV SOLN
Freq: Once | INTRAVENOUS | Status: AC
  Administered 2015-03-28: 11:00:00 via INTRAVENOUS
  Filled 2015-03-28: qty 4

## 2015-03-28 MED ORDER — DEXTROSE 5 % IV SOLN
Freq: Once | INTRAVENOUS | Status: AC
  Administered 2015-03-28: 10:00:00 via INTRAVENOUS
  Filled 2015-03-28: qty 1000

## 2015-03-28 MED ORDER — FLUOROURACIL CHEMO INJECTION 2.5 GM/50ML
400.0000 mg/m2 | Freq: Once | INTRAVENOUS | Status: AC
  Administered 2015-03-28: 650 mg via INTRAVENOUS
  Filled 2015-03-28: qty 13

## 2015-03-28 MED ORDER — DEXTROSE 5 % IV SOLN
650.0000 mg | Freq: Once | INTRAVENOUS | Status: AC
  Administered 2015-03-28: 650 mg via INTRAVENOUS
  Filled 2015-03-28: qty 25

## 2015-03-28 NOTE — Progress Notes (Signed)
[place] [facility] day:  03/28/2015  Chief Complaint: Sandy [name] is a 69 y.o. female with stage IV colon cancer who is seen for assessment prior to cycle #13 FOLFOX chemotherapy.  HPI: The patient was last seen in the medical oncology clinic on 03/14/2015.  At that time, she was seen for assessment prior to cycle #12 FOLFOX chemotherapy.  She only noted a cold neuropathy. She had constipation for which she is managing with Senna. Weight was up and down.  CEA was 269 (improved).  Abdomen and pelvic CT scan on 03/25/2015 revealed interval decrease in size of hepatic metastasis. There were no new lesions.  There was interval decrease in size of distal descending colon mass. There was mild obstruction with moderate volume of stool proximal to the mass lesion.  There was no evidence of peritoneal disease or omental metastasis.  There was stable thickening of the adrenal glands.  During the interim, she has done well.  She voices no new complaints.  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  She has gained 8 pounds.  Bowel movements are normal.  Past Medical History  Diagnosis Date  . Hypertension   . Arthritis   . Constipation   . Insomnia   . Diabetes mellitus without complication ([place])   . Cancer [facility])     liver mets; colon cancer    Past Surgical History  Procedure Laterality Date  . Colonoscopy with propofol N/A 09/07/2014    Procedure: COLONOSCOPY WITH PROPOFOL;  Surgeon: Lucilla Lame, MD;  Location: [facility] ENDOSCOPY;  Service: Endoscopy;  Laterality: N/A;  . Esophagogastroduodenoscopy N/A 09/07/2014    Procedure: ESOPHAGOGASTRODUODENOSCOPY (EGD);  Surgeon: Lucilla Lame, MD;  Location: [facility] ENDOSCOPY;  Service: Endoscopy;  Laterality: N/A;  . Portacath placement Left 09/24/2014    Procedure: INSERTION PORT-A-CATH;  Surgeon: Robert Bellow, MD;  Location: [facility] ORS;  Service: General;  Laterality: Left;  . Liver biopsy       Family History  Problem Relation Age of Onset  . Cancer Sister     Social History:  reports that she quit smoking about 11 years ago. Her smoking use included Cigarettes. She has a 5 pack-year smoking history. She has never used smokeless tobacco. She reports that she does not drink alcohol or use illicit drugs.  The patient is accompanied by her daughter.   Allergies: No Known Allergies  Current Medications: Current Outpatient Prescriptions  Medication Sig Dispense Refill  . amLODipine (NORVASC) 5 MG tablet Take 5 mg by mouth daily.    Marland Kitchen aspirin EC 81 MG tablet Take 81 mg by mouth daily.    Marland Kitchen atorvastatin (LIPITOR) 40 MG tablet Take 40 mg by mouth daily.    Marland Kitchen lidocaine-prilocaine (EMLA) cream Apply 1 application topically as needed. 30 g 1  . lisinopril-hydrochlorothiazide (PRINZIDE,ZESTORETIC) 20-25 MG per tablet Take 1 tablet by mouth daily.    . megestrol (MEGACE) 400 MG/10ML suspension Take 5 mLs (200 mg total) by mouth daily. to stimulate appetite 240 mL 0  . metFORMIN (GLUCOPHAGE) 1000 MG tablet Take 1,000 mg by mouth 2 (two) times daily.     . metoprolol succinate (TOPROL-XL) 25 MG 24 hr tablet Take 25 mg by mouth daily.    . Multiple Vitamin (MULTIVITAMIN WITH MINERALS) TABS tablet Take 1 tablet by mouth daily.    . ondansetron (ZOFRAN) 4 MG tablet Take 1 tablet (4 mg total) by mouth every 8 (eight) hours as needed for nausea  or vomiting. 30 tablet 2  . oxyCODONE-acetaminophen (PERCOCET/ROXICET) 5-325 MG tablet Take 1 tablet by mouth every 6 (six) hours as needed for severe pain. 30 tablet 0  . potassium chloride (K-DUR,KLOR-CON) 10 MEQ tablet Take 1 tablet (10 mEq total) by mouth once. Take 1 pill twice a day for 3 days then 1 pill a day. 30 tablet 0  . Sennosides-Docusate Sodium (STOOL SOFTENER LAXATIVE PO) Take 1 tablet by mouth 2 (two) times daily.     Marland Kitchen zolpidem (AMBIEN) 10 MG tablet Take 1 tablet by mouth at bedtime as needed.     No current facility-administered  medications for this visit.   Facility-Administered Medications Ordered in Other Visits  Medication Dose Route Frequency Provider Last Rate Last Dose  . acetaminophen (TYLENOL) tablet 650 mg  650 mg Oral Once Lequita Asal, MD   650 mg at 10/19/14 1027  . sodium chloride 0.9 % injection 10 mL  10 mL Intracatheter PRN Lequita Asal, MD   10 mL at 10/11/14 1431  . sodium chloride 0.9 % injection 10 mL  10 mL Intracatheter PRN Lequita Asal, MD   10 mL at 02/28/15 1529    Review of Systems:  GENERAL: Feels good. No fevers or sweats.  Weight up 8 pounds (fluctuates 125 to 134 pounds). PERFORMANCE STATUS (ECOG): 2. HEENT: Wears a hearing aide. No visual changes, runny nose, sore throat, mouth sores or tenderness. Lungs: No shortness of breath or cough. No hemoptysis. Cardiac: No chest pain, palpitations, orthopnea, or PND. GI:  Constipation on Senna.  No nausea, vomiting, diarrhea, melena or hematochezia. GU: Wears a Depends pad.  No dysuria or hematuria. Musculoskeletal: Denies back pain. No joint pain. No muscle tenderness. Extremities: No pain or swelling. Skin: No rashes, skin lesions, or ulcers Neuro:  Transient oxaliplatin induced cold neuropathy (stable).  No headache, numbness or weakness, or balance issues. Endocrine: Diabetes.  No thyroid issues, hot flashes or night sweats. Psych: No mood changes, depression or anxiety. Pain: No pain. Review of systems: All other systems reviewed and found to be negative.  Physical Exam: Blood pressure 148/85, pulse 56, temperature 96.5 F (35.8 C), temperature source Tympanic, resp. rate 18, height _0  (1.651 m), weight 137 lb 12.6 oz (62.5 kg). GENERAL: Elderly woman sitting comfortably in a wheelchair in the exam room in no acute distress. MENTAL STATUS: Alert and oriented to person, place and time. HEAD: Styled curly brown hair. Normocephalic, atraumatic, face symmetric, no Cushingoid features. EYES:  Black rimmed glasses. Hazel/green eyes. Pupils equal round and reactive to light and accomodation. No conjunctivitis or scleral icterus. ENT: Oropharynx clear without lesion. Partial. Tongue normal. Mucous membranes moist.  RESPIRATORY: Clear to auscultation without rales, wheezes or rhonchi. CARDIOVASCULAR: Regular rate and rhythm without murmur, rub or gallop. ABDOMEN: Soft, non-tender with active bowel sounds. No guarding or rebound tenderness. No hepatosplenomegaly. SKIN: No rashes, ulcer or skin lesions.  EXTREMITIES: No edema, no skin discoloration or tenderness. No palpable cords. LYMPH NODES: No palpable cervical, supraclavicular, axillary or inguinal adenopathy  NEUROLOGICAL: Unremarkable. PSYCH: Appropriate.  Appointment on 03/28/2015  Component Date Value Ref Range Status  . WBC 03/28/2015 5.0  3.6 - 11.0 K/uL Final  . RBC 03/28/2015 3.61* 3.80 - 5.20 MIL/uL Final  . Hemoglobin 03/28/2015 10.8* 12.0 - 16.0 g/dL Final  . HCT 03/28/2015 32.9* 35.0 - 47.0 % Final  . MCV 03/28/2015 91.0  80.0 - 100.0 fL Final  . MCH 03/28/2015 29.9  26.0 - 34.0 pg  Final  . MCHC 03/28/2015 32.9  32.0 - 36.0 g/dL Final  . RDW 03/28/2015 19.7* 11.5 - 14.5 % Final  . Platelets 03/28/2015 200  150 - 440 K/uL Final  . Neutrophils Relative % 03/28/2015 63   Final  . Neutro Abs 03/28/2015 3.2  1.4 - 6.5 K/uL Final  . Lymphocytes Relative 03/28/2015 23   Final  . Lymphs Abs 03/28/2015 1.2  1.0 - 3.6 K/uL Final  . Monocytes Relative 03/28/2015 12   Final  . Monocytes Absolute 03/28/2015 0.6  0.2 - 0.9 K/uL Final  . Eosinophils Relative 03/28/2015 1   Final  . Eosinophils Absolute 03/28/2015 0.0  0 - 0.7 K/uL Final  . Basophils Relative 03/28/2015 1   Final  . Basophils Absolute 03/28/2015 0.0  0 - 0.1 K/uL Final  . Sodium 03/28/2015 138  135 - 145 mmol/L Final  . Potassium 03/28/2015 3.8  3.5 - 5.1 mmol/L Final  . Chloride 03/28/2015 108  101 - 111 mmol/L Final  . CO2 03/28/2015 22  22 -  32 mmol/L Final  . Glucose, Bld 03/28/2015 169* 65 - 99 mg/dL Final  . BUN 03/28/2015 9  6 - 20 mg/dL Final  . Creatinine, Ser 03/28/2015 0.70  0.44 - 1.00 mg/dL Final  . Calcium 03/28/2015 9.9  8.9 - 10.3 mg/dL Final  . Total Protein 03/28/2015 6.6  6.5 - 8.1 g/dL Final  . Albumin 03/28/2015 3.1* 3.5 - 5.0 g/dL Final  . AST 03/28/2015 20  15 - 41 U/L Final  . ALT 03/28/2015 11* 14 - 54 U/L Final  . Alkaline Phosphatase 03/28/2015 105  38 - 126 U/L Final  . Total Bilirubin 03/28/2015 0.6  0.3 - 1.2 mg/dL Final  . GFR calc non Af Amer 03/28/2015 >60  >60 mL/min Final  . GFR calc Af Amer 03/28/2015 >60  >60 mL/min Final   Comment: (NOTE) The eGFR has been calculated using the CKD EPI equation. This calculation has not been validated in all clinical situations. eGFR's persistently <60 mL/min signify possible Chronic Kidney Disease.   Georgiann Hahn gap 03/28/2015 8  5 - 15 Final    Assessment:  Sandy [name] is a 69 y.o. female with metastatic colon cancer. She presented with a 53 pound weight loss in 6 months. Colonoscopy on 09/07/2014 revealed a circumferential partially obstructive fungating mass in the descending colon. There was no active bleeding. There was a 1.5 cm pedunculated polyp in the sigmoid colon (tubulovillous adenoma). Pathology from the descending colon mass revealed fragments of adenocarcinoma. There was not enough tissue for KRAS testing.  CEA was 11,793 on 09/05/2014.  Chest CT angiogram on 08/28/2014 revealed no evidence of pulmonary embolism, but multiple large enhancing liver masses (largest 6.4 cm).  PET scan on 10/04/2014 revealed 6.5 x 6.1 cm hypermetabolic mass in the proximal sigmoid colon,  There were  numerous large hypermetabolic hepatic metastases. There were no additional sites of metastatic disease in the neck, chest or skeleton.  In addition, there was a 3.2 x 2.8 cm low-attenuation right thyroid nodule.   She is status post 11 cycles of FOLFOX  chemotherapy (10/09/2014 - 02/26/2015).  She declined Avastin.  She is tolerating her chemotherapy well with only a transient cold neuropathy.  CEA was 10,169 on 10/09/2014 , 12,034 on 0712/2016, 7765 on 11/20/2014, 5154 on 12/04/2014, 1921 on 01/01/2015, 1266 on 01/15/2015, 770.6 on 01/29/2015, 502.7 on 02/12/2015, 368.7 on 02/26/2015, and 269 on 03/14/2015.  Abdominal and pelvic CT scan on 12/28/2014  revealed response to therapy.  Liver metastases and the descending/sigmoid mass were smaller.  There was no new disease.  Abdomen and pelvic CT scan on 03/25/2015 revealed interval decrease in size of hepatic metastasis and distal descending colon mass.   Symptomatically, she is doing well.  She is eating well and has gained weight.  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    Plan: 1.  Labs today:  CBC with diff, CMP, CEA. 2.  Review abdomen/pelvic CT scan. 3.  Cycle #13 FOLFOX today. 4.  RTC in 2 days for pump disconnect. 5.  Discuss switching chemo days to Mondays in 04/2015 per daughter's schedule. 6.  Follow-up home heath evaluation. 7.  Encourage patient not to smoke. 8.  RTC in 2 weeks for MD assess, labs (CBC with diff, CMP, CEA, Mg), and cycle #14 FOLFOX.   Lequita Asal, MD  03/28/2015, 9:34 AM

## 2015-03-29 LAB — CEA: CEA: 178.3 ng/mL — ABNORMAL HIGH (ref 0.0–4.7)

## 2015-03-30 ENCOUNTER — Inpatient Hospital Stay: Payer: Medicare Other

## 2015-03-30 VITALS — BP 137/85 | HR 59 | Temp 96.4°F | Resp 18

## 2015-03-30 DIAGNOSIS — Z5111 Encounter for antineoplastic chemotherapy: Secondary | ICD-10-CM | POA: Diagnosis not present

## 2015-03-30 MED ORDER — SODIUM CHLORIDE 0.9 % IJ SOLN
10.0000 mL | INTRAMUSCULAR | Status: DC | PRN
  Administered 2015-03-30: 10 mL
  Filled 2015-03-30: qty 10

## 2015-03-30 MED ORDER — HEPARIN SOD (PORK) LOCK FLUSH 100 UNIT/ML IV SOLN
500.0000 [IU] | Freq: Once | INTRAVENOUS | Status: AC | PRN
  Administered 2015-03-30: 500 [IU]

## 2015-04-11 ENCOUNTER — Other Ambulatory Visit: Payer: Self-pay

## 2015-04-11 ENCOUNTER — Inpatient Hospital Stay (HOSPITAL_BASED_OUTPATIENT_CLINIC_OR_DEPARTMENT_OTHER): Payer: Medicare Other | Admitting: Oncology

## 2015-04-11 ENCOUNTER — Other Ambulatory Visit: Payer: Self-pay | Admitting: Hematology and Oncology

## 2015-04-11 ENCOUNTER — Inpatient Hospital Stay: Payer: Medicare Other

## 2015-04-11 VITALS — BP 137/87 | HR 64 | Temp 97.2°F | Resp 18 | Wt 137.8 lb

## 2015-04-11 DIAGNOSIS — I10 Essential (primary) hypertension: Secondary | ICD-10-CM

## 2015-04-11 DIAGNOSIS — Z5111 Encounter for antineoplastic chemotherapy: Secondary | ICD-10-CM | POA: Diagnosis not present

## 2015-04-11 DIAGNOSIS — Z87891 Personal history of nicotine dependence: Secondary | ICD-10-CM

## 2015-04-11 DIAGNOSIS — Z79899 Other long term (current) drug therapy: Secondary | ICD-10-CM

## 2015-04-11 DIAGNOSIS — G4700 Insomnia, unspecified: Secondary | ICD-10-CM

## 2015-04-11 DIAGNOSIS — E041 Nontoxic single thyroid nodule: Secondary | ICD-10-CM

## 2015-04-11 DIAGNOSIS — C787 Secondary malignant neoplasm of liver and intrahepatic bile duct: Secondary | ICD-10-CM

## 2015-04-11 DIAGNOSIS — C189 Malignant neoplasm of colon, unspecified: Secondary | ICD-10-CM

## 2015-04-11 DIAGNOSIS — Z7982 Long term (current) use of aspirin: Secondary | ICD-10-CM

## 2015-04-11 DIAGNOSIS — E119 Type 2 diabetes mellitus without complications: Secondary | ICD-10-CM

## 2015-04-11 DIAGNOSIS — Z7984 Long term (current) use of oral hypoglycemic drugs: Secondary | ICD-10-CM

## 2015-04-11 DIAGNOSIS — M129 Arthropathy, unspecified: Secondary | ICD-10-CM

## 2015-04-11 DIAGNOSIS — D125 Benign neoplasm of sigmoid colon: Secondary | ICD-10-CM

## 2015-04-11 DIAGNOSIS — K5900 Constipation, unspecified: Secondary | ICD-10-CM

## 2015-04-11 DIAGNOSIS — G629 Polyneuropathy, unspecified: Secondary | ICD-10-CM | POA: Diagnosis not present

## 2015-04-11 LAB — CBC WITH DIFFERENTIAL/PLATELET
Basophils Absolute: 0 10*3/uL (ref 0–0.1)
Basophils Relative: 1 %
Eosinophils Absolute: 0 10*3/uL (ref 0–0.7)
Eosinophils Relative: 1 %
HCT: 34.3 % — ABNORMAL LOW (ref 35.0–47.0)
Hemoglobin: 11.1 g/dL — ABNORMAL LOW (ref 12.0–16.0)
Lymphocytes Relative: 18 %
Lymphs Abs: 1.2 10*3/uL (ref 1.0–3.6)
MCH: 29.7 pg (ref 26.0–34.0)
MCHC: 32.5 g/dL (ref 32.0–36.0)
MCV: 91.3 fL (ref 80.0–100.0)
Monocytes Absolute: 0.6 10*3/uL (ref 0.2–0.9)
Monocytes Relative: 9 %
Neutro Abs: 4.5 10*3/uL (ref 1.4–6.5)
Neutrophils Relative %: 71 %
Platelets: 191 10*3/uL (ref 150–440)
RBC: 3.75 MIL/uL — ABNORMAL LOW (ref 3.80–5.20)
RDW: 20 % — ABNORMAL HIGH (ref 11.5–14.5)
WBC: 6.4 10*3/uL (ref 3.6–11.0)

## 2015-04-11 LAB — COMPREHENSIVE METABOLIC PANEL
ALT: 10 U/L — ABNORMAL LOW (ref 14–54)
AST: 18 U/L (ref 15–41)
Albumin: 3.2 g/dL — ABNORMAL LOW (ref 3.5–5.0)
Alkaline Phosphatase: 124 U/L (ref 38–126)
Anion gap: 7 (ref 5–15)
BUN: 9 mg/dL (ref 6–20)
CO2: 23 mmol/L (ref 22–32)
Calcium: 10 mg/dL (ref 8.9–10.3)
Chloride: 108 mmol/L (ref 101–111)
Creatinine, Ser: 0.75 mg/dL (ref 0.44–1.00)
GFR calc Af Amer: >60 mL/min (ref >60)
GFR calc non Af Amer: >60 mL/min (ref >60)
Glucose, Bld: 156 mg/dL — ABNORMAL HIGH (ref 65–99)
Potassium: 3.8 mmol/L (ref 3.5–5.1)
Sodium: 138 mmol/L (ref 135–145)
Total Bilirubin: 0.7 mg/dL (ref 0.3–1.2)
Total Protein: 6.9 g/dL (ref 6.5–8.1)

## 2015-04-11 LAB — MAGNESIUM: Magnesium: 1.8 mg/dL (ref 1.7–2.4)

## 2015-04-11 MED ORDER — OXYCODONE-ACETAMINOPHEN 5-325 MG PO TABS: 1.0000 | ORAL_TABLET | Freq: Four times a day (QID) | ORAL | Status: DC | PRN

## 2015-04-11 MED ORDER — DEXTROSE 5 % IV SOLN
Freq: Once | INTRAVENOUS | Status: AC
  Administered 2015-04-11: 11:00:00 via INTRAVENOUS
  Filled 2015-04-11: qty 1000

## 2015-04-11 MED ORDER — LEUCOVORIN CALCIUM INJECTION 350 MG: 650.0000 mg | Freq: Once | INTRAMUSCULAR | Status: DC

## 2015-04-11 MED ORDER — SODIUM CHLORIDE 0.9 % IV SOLN
2400.0000 mg/m2 | INTRAVENOUS | Status: DC
  Administered 2015-04-11: 3900 mg via INTRAVENOUS
  Filled 2015-04-11: qty 78

## 2015-04-11 MED ORDER — LEUCOVORIN CALCIUM INJECTION 100 MG
20.0000 mg/m2 | Freq: Once | INTRAMUSCULAR | Status: AC
  Administered 2015-04-11: 34 mg via INTRAVENOUS
  Filled 2015-04-11: qty 1.7

## 2015-04-11 MED ORDER — OXALIPLATIN CHEMO INJECTION 100 MG/20ML
85.0000 mg/m2 | Freq: Once | INTRAVENOUS | Status: AC
  Administered 2015-04-11: 140 mg via INTRAVENOUS
  Filled 2015-04-11: qty 20

## 2015-04-11 MED ORDER — FLUOROURACIL CHEMO INJECTION 2.5 GM/50ML
400.0000 mg/m2 | Freq: Once | INTRAVENOUS | Status: AC
  Administered 2015-04-11: 650 mg via INTRAVENOUS
  Filled 2015-04-11: qty 13

## 2015-04-11 MED ORDER — SODIUM CHLORIDE 0.9 % IJ SOLN
10.0000 mL | INTRAMUSCULAR | Status: DC | PRN
  Administered 2015-04-11: 10 mL
  Filled 2015-04-11: qty 10

## 2015-04-11 MED ORDER — SODIUM CHLORIDE 0.9 % IV SOLN
Freq: Once | INTRAVENOUS | Status: AC
  Administered 2015-04-11: 11:00:00 via INTRAVENOUS
  Filled 2015-04-11: qty 4

## 2015-04-11 NOTE — Progress Notes (Signed)
Patient denies any problems today. 

## 2015-04-12 LAB — CEA: CEA: 135.9 ng/mL — ABNORMAL HIGH (ref 0.0–4.7)

## 2015-04-13 ENCOUNTER — Inpatient Hospital Stay: Payer: Medicare Other

## 2015-04-13 VITALS — BP 134/94 | HR 61 | Temp 96.8°F | Resp 18

## 2015-04-13 DIAGNOSIS — Z5111 Encounter for antineoplastic chemotherapy: Secondary | ICD-10-CM | POA: Diagnosis not present

## 2015-04-13 MED ORDER — SODIUM CHLORIDE 0.9 % IJ SOLN
10.0000 mL | INTRAMUSCULAR | Status: DC | PRN
  Administered 2015-04-13: 10 mL
  Filled 2015-04-13: qty 10

## 2015-04-13 MED ORDER — HEPARIN SOD (PORK) LOCK FLUSH 100 UNIT/ML IV SOLN
500.0000 [IU] | Freq: Once | INTRAVENOUS | Status: AC | PRN
  Administered 2015-04-13: 500 [IU]

## 2015-04-15 NOTE — Progress Notes (Signed)
[place] [facility] day:  04/15/2015  Chief Complaint: Sandy [name] is a 70 y.o. female with stage IV colon cancer who is seen for assessment prior to cycle #14 FOLFOX chemotherapy.  HPI:  Patient continues to tolerate her treatments well without significant side effects. She has a mild cold neuropathy which does not affect her day-to-day activity. She previously had constipation, which is well controlled with senna.  Abdomen and pelvic CT scan on 03/25/2015 revealed interval decrease in size of hepatic metastasis. There were no new lesions.  There was interval decrease in size of distal descending colon mass. There was mild obstruction with moderate volume of stool proximal to the mass lesion.  There was no evidence of peritoneal disease or omental metastasis.  There was stable thickening of the adrenal glands.   Past Medical History  Diagnosis Date  . Hypertension   . Arthritis   . Constipation   . Insomnia   . Diabetes mellitus without complication ([place])   . Cancer [facility])     liver mets; colon cancer    Past Surgical History  Procedure Laterality Date  . Colonoscopy with propofol N/A 09/07/2014    Procedure: COLONOSCOPY WITH PROPOFOL;  Surgeon: Lucilla Lame, MD;  Location: [facility] ENDOSCOPY;  Service: Endoscopy;  Laterality: N/A;  . Esophagogastroduodenoscopy N/A 09/07/2014    Procedure: ESOPHAGOGASTRODUODENOSCOPY (EGD);  Surgeon: Lucilla Lame, MD;  Location: [facility] ENDOSCOPY;  Service: Endoscopy;  Laterality: N/A;  . Portacath placement Left 09/24/2014    Procedure: INSERTION PORT-A-CATH;  Surgeon: Robert Bellow, MD;  Location: [facility] ORS;  Service: General;  Laterality: Left;  . Liver biopsy      Family History  Problem Relation Age of Onset  . Cancer Sister     Social History:  reports that she quit smoking about 12 years ago. Her smoking use included Cigarettes. She has a 5 pack-year smoking history. She has never used smokeless tobacco.  She reports that she does not drink alcohol or use illicit drugs.  The patient is accompanied by her daughter.   Allergies: No Known Allergies  Current Medications: Current Outpatient Prescriptions  Medication Sig Dispense Refill  . amLODipine (NORVASC) 5 MG tablet Take 5 mg by mouth daily.    Marland Kitchen aspirin EC 81 MG tablet Take 81 mg by mouth daily.    Marland Kitchen atorvastatin (LIPITOR) 40 MG tablet Take 40 mg by mouth daily.    Marland Kitchen lidocaine-prilocaine (EMLA) cream Apply 1 application topically as needed. 30 g 1  . lisinopril-hydrochlorothiazide (PRINZIDE,ZESTORETIC) 20-25 MG per tablet Take 1 tablet by mouth daily.    . megestrol (MEGACE) 400 MG/10ML suspension Take 5 mLs (200 mg total) by mouth daily. to stimulate appetite 240 mL 0  . metFORMIN (GLUCOPHAGE) 1000 MG tablet Take 1,000 mg by mouth 2 (two) times daily.     . metoprolol succinate (TOPROL-XL) 25 MG 24 hr tablet Take 25 mg by mouth daily.    . Multiple Vitamin (MULTIVITAMIN WITH MINERALS) TABS tablet Take 1 tablet by mouth daily.    . ondansetron (ZOFRAN) 4 MG tablet Take 1 tablet (4 mg total) by mouth every 8 (eight) hours as needed for nausea or vomiting. 30 tablet 2  . potassium chloride (K-DUR,KLOR-CON) 10 MEQ tablet Take 1 tablet (10 mEq total) by mouth once. Take 1 pill twice a day for 3 days then 1 pill a day. 30 tablet 0  . Sennosides-Docusate Sodium (STOOL SOFTENER LAXATIVE PO) Take 1 tablet by mouth 2 (  two) times daily.     Marland Kitchen zolpidem (AMBIEN) 10 MG tablet Take 1 tablet by mouth at bedtime as needed.    Marland Kitchen oxyCODONE-acetaminophen (PERCOCET/ROXICET) 5-325 MG tablet Take 1 tablet by mouth every 6 (six) hours as needed for severe pain. 30 tablet 0   No current facility-administered medications for this visit.   Facility-Administered Medications Ordered in Other Visits  Medication Dose Route Frequency Provider Last Rate Last Dose  . acetaminophen (TYLENOL) tablet 650 mg  650 mg Oral Once Lequita Asal, MD   650 mg at 10/19/14 8657   . sodium chloride 0.9 % injection 10 mL  10 mL Intracatheter PRN Lequita Asal, MD   10 mL at 10/11/14 1431  . sodium chloride 0.9 % injection 10 mL  10 mL Intracatheter PRN Lequita Asal, MD   10 mL at 02/28/15 1529    Review of Systems:  GENERAL: Feels good. No fevers or sweats.   PERFORMANCE STATUS (ECOG): 1. HEENT: Wears a hearing aide. No visual changes, runny nose, sore throat, mouth sores or tenderness. Lungs: No shortness of breath or cough. No hemoptysis. Cardiac: No chest pain, palpitations, orthopnea, or PND. GI:  Constipation on Senna.  No nausea, vomiting, diarrhea, melena or hematochezia. GU: Wears a Depends pad.  No dysuria or hematuria. Musculoskeletal: Denies back pain. No joint pain. No muscle tenderness. Extremities: No pain or swelling. Skin: No rashes, skin lesions, or ulcers Neuro:  Transient oxaliplatin induced cold neuropathy (stable).  No headache, numbness or weakness, or balance issues. Endocrine: Diabetes.  No thyroid issues, hot flashes or night sweats. Psych: No mood changes, depression or anxiety. Pain: No pain.  Review of systems: All other systems reviewed and found to be negative.  Physical Exam: Blood pressure 137/87, pulse 64, temperature 97.2 F (36.2 C), temperature source Tympanic, resp. rate 18, weight 137 lb 12.6 oz (62.5 kg). GENERAL: Elderly woman sitting comfortably in a wheelchair in the exam room in no acute distress. MENTAL STATUS: Alert and oriented to person, place and time. HEAD: Styled curly brown hair. Normocephalic, atraumatic, face symmetric, no Cushingoid features. EYES: Black rimmed glasses. Hazel/green eyes. Pupils equal round and reactive to light and accomodation. No conjunctivitis or scleral icterus. ENT: Oropharynx clear without lesion. Partial. Tongue normal. Mucous membranes moist.  RESPIRATORY: Clear to auscultation without rales, wheezes or rhonchi. CARDIOVASCULAR: Regular rate and  rhythm without murmur, rub or gallop. ABDOMEN: Soft, non-tender with active bowel sounds. No guarding or rebound tenderness. No hepatosplenomegaly. SKIN: No rashes, ulcer or skin lesions.  EXTREMITIES: No edema, no skin discoloration or tenderness. No palpable cords. LYMPH NODES: No palpable cervical, supraclavicular, axillary or inguinal adenopathy  NEUROLOGICAL: Unremarkable. PSYCH: Appropriate.  Appointment on 04/11/2015  Component Date Value Ref Range Status  . WBC 04/11/2015 6.4  3.6 - 11.0 K/uL Final  . RBC 04/11/2015 3.75* 3.80 - 5.20 MIL/uL Final  . Hemoglobin 04/11/2015 11.1* 12.0 - 16.0 g/dL Final  . HCT 04/11/2015 34.3* 35.0 - 47.0 % Final  . MCV 04/11/2015 91.3  80.0 - 100.0 fL Final  . MCH 04/11/2015 29.7  26.0 - 34.0 pg Final  . MCHC 04/11/2015 32.5  32.0 - 36.0 g/dL Final  . RDW 04/11/2015 20.0* 11.5 - 14.5 % Final  . Platelets 04/11/2015 191  150 - 440 K/uL Final  . Neutrophils Relative % 04/11/2015 71   Final  . Neutro Abs 04/11/2015 4.5  1.4 - 6.5 K/uL Final  . Lymphocytes Relative 04/11/2015 18   Final  .  Lymphs Abs 04/11/2015 1.2  1.0 - 3.6 K/uL Final  . Monocytes Relative 04/11/2015 9   Final  . Monocytes Absolute 04/11/2015 0.6  0.2 - 0.9 K/uL Final  . Eosinophils Relative 04/11/2015 1   Final  . Eosinophils Absolute 04/11/2015 0.0  0 - 0.7 K/uL Final  . Basophils Relative 04/11/2015 1   Final  . Basophils Absolute 04/11/2015 0.0  0 - 0.1 K/uL Final  . Sodium 04/11/2015 138  135 - 145 mmol/L Final  . Potassium 04/11/2015 3.8  3.5 - 5.1 mmol/L Final  . Chloride 04/11/2015 108  101 - 111 mmol/L Final  . CO2 04/11/2015 23  22 - 32 mmol/L Final  . Glucose, Bld 04/11/2015 156* 65 - 99 mg/dL Final  . BUN 04/11/2015 9  6 - 20 mg/dL Final  . Creatinine, Ser 04/11/2015 0.75  0.44 - 1.00 mg/dL Final  . Calcium 04/11/2015 10.0  8.9 - 10.3 mg/dL Final  . Total Protein 04/11/2015 6.9  6.5 - 8.1 g/dL Final  . Albumin 04/11/2015 3.2* 3.5 - 5.0 g/dL Final  . AST  04/11/2015 18  15 - 41 U/L Final  . ALT 04/11/2015 10* 14 - 54 U/L Final  . Alkaline Phosphatase 04/11/2015 124  38 - 126 U/L Final  . Total Bilirubin 04/11/2015 0.7  0.3 - 1.2 mg/dL Final  . GFR calc non Af Amer 04/11/2015 >60  >60 mL/min Final  . GFR calc Af Amer 04/11/2015 >60  >60 mL/min Final   Comment: (NOTE) The eGFR has been calculated using the CKD EPI equation. This calculation has not been validated in all clinical situations. eGFR's persistently <60 mL/min signify possible Chronic Kidney Disease.   . Anion gap 04/11/2015 7  5 - 15 Final  . CEA 04/11/2015 135.9* 0.0 - 4.7 ng/mL Final   Comment: (NOTE)       Roche ECLIA methodology       Nonsmokers  <3.9                                     Smokers     <5.6 Performed At: [facility] [place], [place] [id number] Lindon Romp MD [id number]   . Magnesium 04/11/2015 1.8  1.7 - 2.4 mg/dL Final    Assessment:  ROWYNN MCWEENEY is a 70 y.o. female with metastatic colon cancer. She presented with a 53 pound weight loss in 6 months. Colonoscopy on 09/07/2014 revealed a circumferential partially obstructive fungating mass in the descending colon. There was no active bleeding. There was a 1.5 cm pedunculated polyp in the sigmoid colon (tubulovillous adenoma). Pathology from the descending colon mass revealed fragments of adenocarcinoma. There was not enough tissue for KRAS testing.  CEA was 11,793 on 09/05/2014.  Chest CT angiogram on 08/28/2014 revealed no evidence of pulmonary embolism, but multiple large enhancing liver masses (largest 6.4 cm).  PET scan on 10/04/2014 revealed 6.5 x 6.1 cm hypermetabolic mass in the proximal sigmoid colon,  There were  numerous large hypermetabolic hepatic metastases. There were no additional sites of metastatic disease in the neck, chest or skeleton.  In addition, there was a 3.2 x 2.8 cm low-attenuation right thyroid nodule.   She is status post 11 cycles of  FOLFOX chemotherapy (10/09/2014 - 02/26/2015).  She declined Avastin.  She is tolerating her chemotherapy well with only a transient cold neuropathy.  CEA was 10,169 on 10/09/2014 ,  12,034 on 0712/2016, 7765 on 11/20/2014, 5154 on 12/04/2014, 1921 on 01/01/2015, 1266 on 01/15/2015, 770.6 on 01/29/2015, 502.7 on 02/12/2015, 368.7 on 02/26/2015, and 269 on 03/14/2015.  Abdominal and pelvic CT scan on 12/28/2014 revealed response to therapy.  Liver metastases and the descending/sigmoid mass were smaller.  There was no new disease.  Abdomen and pelvic CT scan on 03/25/2015 revealed interval decrease in size of hepatic metastasis and distal descending colon mass.   Symptomatically, she is doing well.  She is eating well and has gained weight.  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    Plan: 1.  Labs today:  CBC with diff, CMP, CEA. 2.  Cycle #14 FOLFOX today. 3.  RTC in 2 days for pump disconnect. 4.  Patient has requested switching treatment days to Monday, therefore she will return to clinic on April 29, 2015 for consideration of cycle 15. 5.  Follow-up home heath evaluation.   Lloyd Huger, MD  04/15/2015, 11:23 AM

## 2015-04-26 ENCOUNTER — Other Ambulatory Visit: Payer: Self-pay

## 2015-04-26 DIAGNOSIS — C787 Secondary malignant neoplasm of liver and intrahepatic bile duct: Principal | ICD-10-CM

## 2015-04-26 DIAGNOSIS — C189 Malignant neoplasm of colon, unspecified: Secondary | ICD-10-CM

## 2015-04-29 ENCOUNTER — Encounter: Payer: Self-pay | Admitting: Hematology and Oncology

## 2015-04-29 ENCOUNTER — Inpatient Hospital Stay: Payer: Medicare Other

## 2015-04-29 ENCOUNTER — Inpatient Hospital Stay: Payer: Medicare Other | Attending: Hematology and Oncology | Admitting: Hematology and Oncology

## 2015-04-29 VITALS — BP 152/90 | HR 60 | Temp 95.2°F | Resp 18 | Ht 65.0 in | Wt 139.1 lb

## 2015-04-29 DIAGNOSIS — C787 Secondary malignant neoplasm of liver and intrahepatic bile duct: Principal | ICD-10-CM

## 2015-04-29 DIAGNOSIS — E119 Type 2 diabetes mellitus without complications: Secondary | ICD-10-CM | POA: Insufficient documentation

## 2015-04-29 DIAGNOSIS — R634 Abnormal weight loss: Secondary | ICD-10-CM | POA: Insufficient documentation

## 2015-04-29 DIAGNOSIS — E041 Nontoxic single thyroid nodule: Secondary | ICD-10-CM | POA: Insufficient documentation

## 2015-04-29 DIAGNOSIS — Z79899 Other long term (current) drug therapy: Secondary | ICD-10-CM

## 2015-04-29 DIAGNOSIS — G4700 Insomnia, unspecified: Secondary | ICD-10-CM | POA: Insufficient documentation

## 2015-04-29 DIAGNOSIS — K5900 Constipation, unspecified: Secondary | ICD-10-CM | POA: Insufficient documentation

## 2015-04-29 DIAGNOSIS — I10 Essential (primary) hypertension: Secondary | ICD-10-CM | POA: Diagnosis not present

## 2015-04-29 DIAGNOSIS — M129 Arthropathy, unspecified: Secondary | ICD-10-CM | POA: Diagnosis not present

## 2015-04-29 DIAGNOSIS — C189 Malignant neoplasm of colon, unspecified: Secondary | ICD-10-CM | POA: Diagnosis not present

## 2015-04-29 DIAGNOSIS — G629 Polyneuropathy, unspecified: Secondary | ICD-10-CM | POA: Insufficient documentation

## 2015-04-29 DIAGNOSIS — Z5111 Encounter for antineoplastic chemotherapy: Secondary | ICD-10-CM | POA: Insufficient documentation

## 2015-04-29 DIAGNOSIS — Z87891 Personal history of nicotine dependence: Secondary | ICD-10-CM | POA: Diagnosis not present

## 2015-04-29 DIAGNOSIS — Z7982 Long term (current) use of aspirin: Secondary | ICD-10-CM | POA: Diagnosis not present

## 2015-04-29 DIAGNOSIS — Z7984 Long term (current) use of oral hypoglycemic drugs: Secondary | ICD-10-CM | POA: Diagnosis not present

## 2015-04-29 LAB — COMPREHENSIVE METABOLIC PANEL
ALT: 12 U/L — ABNORMAL LOW (ref 14–54)
AST: 19 U/L (ref 15–41)
Albumin: 3.2 g/dL — ABNORMAL LOW (ref 3.5–5.0)
Alkaline Phosphatase: 127 U/L — ABNORMAL HIGH (ref 38–126)
Anion gap: 8 (ref 5–15)
BUN: 11 mg/dL (ref 6–20)
CO2: 23 mmol/L (ref 22–32)
Calcium: 10 mg/dL (ref 8.9–10.3)
Chloride: 107 mmol/L (ref 101–111)
Creatinine, Ser: 0.79 mg/dL (ref 0.44–1.00)
GFR calc Af Amer: >60 mL/min (ref >60)
GFR calc non Af Amer: >60 mL/min (ref >60)
Glucose, Bld: 159 mg/dL — ABNORMAL HIGH (ref 65–99)
Potassium: 3.7 mmol/L (ref 3.5–5.1)
Sodium: 138 mmol/L (ref 135–145)
Total Bilirubin: 0.7 mg/dL (ref 0.3–1.2)
Total Protein: 7.1 g/dL (ref 6.5–8.1)

## 2015-04-29 LAB — CBC WITH DIFFERENTIAL/PLATELET
Basophils Absolute: 0 10*3/uL (ref 0–0.1)
Basophils Relative: 1 %
Eosinophils Absolute: 0 10*3/uL (ref 0–0.7)
Eosinophils Relative: 1 %
HCT: 33.8 % — ABNORMAL LOW (ref 35.0–47.0)
Hemoglobin: 11.3 g/dL — ABNORMAL LOW (ref 12.0–16.0)
Lymphocytes Relative: 29 %
Lymphs Abs: 1.3 10*3/uL (ref 1.0–3.6)
MCH: 30.3 pg (ref 26.0–34.0)
MCHC: 33.4 g/dL (ref 32.0–36.0)
MCV: 90.6 fL (ref 80.0–100.0)
Monocytes Absolute: 0.7 10*3/uL (ref 0.2–0.9)
Monocytes Relative: 16 %
Neutro Abs: 2.4 10*3/uL (ref 1.4–6.5)
Neutrophils Relative %: 53 %
Platelets: 260 10*3/uL (ref 150–440)
RBC: 3.73 MIL/uL — ABNORMAL LOW (ref 3.80–5.20)
RDW: 19 % — ABNORMAL HIGH (ref 11.5–14.5)
WBC: 4.5 10*3/uL (ref 3.6–11.0)

## 2015-04-29 LAB — MAGNESIUM: Magnesium: 1.9 mg/dL (ref 1.7–2.4)

## 2015-04-29 MED ORDER — HEPARIN SOD (PORK) LOCK FLUSH 100 UNIT/ML IV SOLN: 500.0000 [IU] | Freq: Once | INTRAVENOUS | Status: DC

## 2015-04-29 MED ORDER — FLUOROURACIL CHEMO INJECTION 2.5 GM/50ML
400.0000 mg/m2 | Freq: Once | INTRAVENOUS | Status: AC
  Administered 2015-04-29: 650 mg via INTRAVENOUS
  Filled 2015-04-29: qty 13

## 2015-04-29 MED ORDER — SODIUM CHLORIDE 0.9 % IV SOLN
2400.0000 mg/m2 | INTRAVENOUS | Status: DC
  Administered 2015-04-29: 3900 mg via INTRAVENOUS
  Filled 2015-04-29: qty 78

## 2015-04-29 MED ORDER — SODIUM CHLORIDE 0.9 % IJ SOLN
10.0000 mL | INTRAMUSCULAR | Status: DC | PRN
  Administered 2015-04-29: 10 mL via INTRAVENOUS
  Filled 2015-04-29: qty 10

## 2015-04-29 MED ORDER — DEXTROSE 5 % IV SOLN
Freq: Once | INTRAVENOUS | Status: DC
  Filled 2015-04-29: qty 1000

## 2015-04-29 MED ORDER — DEXTROSE 5 % IV SOLN: 650.0000 mg | Freq: Once | INTRAVENOUS | Status: DC

## 2015-04-29 MED ORDER — SODIUM CHLORIDE 0.9 % IV SOLN
Freq: Once | INTRAVENOUS | Status: AC
  Administered 2015-04-29: 11:00:00 via INTRAVENOUS
  Filled 2015-04-29: qty 4

## 2015-04-29 MED ORDER — SODIUM CHLORIDE 0.9 % IV SOLN
INTRAVENOUS | Status: DC
  Administered 2015-04-29: 11:00:00 via INTRAVENOUS
  Filled 2015-04-29: qty 1000

## 2015-04-29 MED ORDER — LEUCOVORIN CALCIUM INJECTION 100 MG
20.0000 mg/m2 | Freq: Once | INTRAMUSCULAR | Status: AC
  Administered 2015-04-29: 32 mg via INTRAVENOUS
  Filled 2015-04-29: qty 1.6

## 2015-04-29 NOTE — Progress Notes (Signed)
[facility] day:  04/29/2015  Chief Complaint: MAKAIYA GEERDES is a 70 y.o. female with stage IV colon cancer who is seen for assessment prior to cycle #15 FOLFOX chemotherapy.  HPI: The patient was last seen in the medical oncology clinic on 03/28/2015.  At that time, she was doing well.  She voiced no complaints.  She received cycle #13 FOLFOX chemotherapy.  She saw Dr. Grayland Ormond in my absence on 04/11/2015.  She was doing well.  She received cycle #14 FOLFOX chemotherapy.  CEA was 135.9 (improved from 178.3).  During the interim, she notes a progressive neuropathy.  She describes her hands getting numb.  She has dropped cups.  She has had issues buttoning buttons, zipping zippers, and at times bathing herself.  She has no trouble with a fork.  She denies any diarrhea, mouth sores or tenderness.  She has gained 2 pounds.   Past Medical History  Diagnosis Date  . Hypertension   . Arthritis   . Constipation   . Insomnia   . Diabetes mellitus without complication ([place])   . Cancer [facility])     liver mets; colon cancer    Past Surgical History  Procedure Laterality Date  . Colonoscopy with propofol N/A 09/07/2014    Procedure: COLONOSCOPY WITH PROPOFOL;  Surgeon: Lucilla Lame, MD;  Location: [facility] ENDOSCOPY;  Service: Endoscopy;  Laterality: N/A;  . Esophagogastroduodenoscopy N/A 09/07/2014    Procedure: ESOPHAGOGASTRODUODENOSCOPY (EGD);  Surgeon: Lucilla Lame, MD;  Location: [facility] ENDOSCOPY;  Service: Endoscopy;  Laterality: N/A;  . Portacath placement Left 09/24/2014    Procedure: INSERTION PORT-A-CATH;  Surgeon: Robert Bellow, MD;  Location: [facility] ORS;  Service: General;  Laterality: Left;  . Liver biopsy      Family History  Problem Relation Age of Onset  . Cancer Sister     Social History:  reports that she quit smoking about 12 years ago. Her smoking use included Cigarettes. She has a 5 pack-year smoking history. She has never used  smokeless tobacco. She reports that she does not drink alcohol or use illicit drugs.  The patient is accompanied by her daughter.   Allergies: No Known Allergies  Current Medications: Current Outpatient Prescriptions  Medication Sig Dispense Refill  . amLODipine (NORVASC) 5 MG tablet Take 5 mg by mouth daily.    Marland Kitchen aspirin EC 81 MG tablet Take 81 mg by mouth daily.    Marland Kitchen atorvastatin (LIPITOR) 40 MG tablet Take 40 mg by mouth daily.    Marland Kitchen lidocaine-prilocaine (EMLA) cream Apply 1 application topically as needed. 30 g 1  . lisinopril-hydrochlorothiazide (PRINZIDE,ZESTORETIC) 20-25 MG per tablet Take 1 tablet by mouth daily.    . megestrol (MEGACE) 400 MG/10ML suspension Take 5 mLs (200 mg total) by mouth daily. to stimulate appetite 240 mL 0  . metFORMIN (GLUCOPHAGE) 1000 MG tablet Take 1,000 mg by mouth 2 (two) times daily.     . metoprolol succinate (TOPROL-XL) 25 MG 24 hr tablet Take 25 mg by mouth daily.    . Multiple Vitamin (MULTIVITAMIN WITH MINERALS) TABS tablet Take 1 tablet by mouth daily.    . ondansetron (ZOFRAN) 4 MG tablet Take 1 tablet (4 mg total) by mouth every 8 (eight) hours as needed for nausea or vomiting. 30 tablet 2  . oxyCODONE-acetaminophen (PERCOCET/ROXICET) 5-325 MG tablet Take 1 tablet by mouth every 6 (six) hours as needed for severe pain. 30 tablet 0  . potassium chloride (K-DUR,KLOR-CON) 10 MEQ  tablet Take 1 tablet (10 mEq total) by mouth once. Take 1 pill twice a day for 3 days then 1 pill a day. 30 tablet 0  . Sennosides-Docusate Sodium (STOOL SOFTENER LAXATIVE PO) Take 1 tablet by mouth 2 (two) times daily.     Marland Kitchen zolpidem (AMBIEN) 10 MG tablet Take 1 tablet by mouth at bedtime as needed.     No current facility-administered medications for this visit.   Facility-Administered Medications Ordered in Other Visits  Medication Dose Route Frequency Provider Last Rate Last Dose  . acetaminophen (TYLENOL) tablet 650 mg  650 mg Oral Once Lequita Asal, MD   650 mg  at 10/19/14 6160  . sodium chloride 0.9 % injection 10 mL  10 mL Intracatheter PRN Lequita Asal, MD   10 mL at 10/11/14 1431  . sodium chloride 0.9 % injection 10 mL  10 mL Intracatheter PRN Lequita Asal, MD   10 mL at 02/28/15 1529    Review of Systems:  GENERAL: Feels good. No fevers or sweats.  Weight up 2 pounds in past month. PERFORMANCE STATUS (ECOG): 2. HEENT: Wears a hearing aide. No visual changes, runny nose, sore throat, mouth sores or tenderness. Lungs: No shortness of breath or cough. No hemoptysis. Cardiac: No chest pain, palpitations, orthopnea, or PND. GI:  Constipation on Senna.  No nausea, vomiting, diarrhea, melena or hematochezia. GU: Wears a Depends pad.  No dysuria or hematuria. Musculoskeletal: Denies back pain. No joint pain. No muscle tenderness. Extremities: No pain or swelling. Skin: No rashes, skin lesions, or ulcers Neuro:  Neuropathy in fingers (see HPI).  Transient oxaliplatin induced cold neuropathy (stable).  No headache, numbness or weakness, or balance issues. Endocrine: Diabetes.  No thyroid issues, hot flashes or night sweats. Psych: No mood changes, depression or anxiety. Pain: No pain. Review of systems: All other systems reviewed and found to be negative.  Physical Exam: Blood pressure 152/90, pulse 60, temperature 95.2 F (35.1 C), temperature source Tympanic, resp. rate 18, height 5' 5"  (1.651 m), weight 139 lb 1.8 oz (63.1 kg). GENERAL: Elderly woman sitting comfortably in a wheelchair in the exam room in no acute distress. MENTAL STATUS: Alert and oriented to person, place and time. HEAD: Styled curly brown hair. Normocephalic, atraumatic, face symmetric, no Cushingoid features. EYES: Black rimmed glasses. Hazel/green eyes. Pupils equal round and reactive to light and accomodation. No conjunctivitis or scleral icterus. ENT: Oropharynx clear without lesion. Partial. Tongue normal. Mucous membranes moist.   RESPIRATORY: Clear to auscultation without rales, wheezes or rhonchi. CARDIOVASCULAR: Regular rate and rhythm without murmur, rub or gallop. ABDOMEN: Soft, non-tender with active bowel sounds. No guarding or rebound tenderness. No hepatosplenomegaly. SKIN: No rashes, ulcer or skin lesions.  EXTREMITIES: No edema, no skin discoloration or tenderness. No palpable cords. LYMPH NODES: No palpable cervical, supraclavicular, axillary or inguinal adenopathy  NEUROLOGICAL: Unremarkable.  Gait slightly unsteady. PSYCH: Appropriate.  Appointment on 04/29/2015  Component Date Value Ref Range Status  . WBC 04/29/2015 4.5  3.6 - 11.0 K/uL Final  . RBC 04/29/2015 3.73* 3.80 - 5.20 MIL/uL Final  . Hemoglobin 04/29/2015 11.3* 12.0 - 16.0 g/dL Final  . HCT 04/29/2015 33.8* 35.0 - 47.0 % Final  . MCV 04/29/2015 90.6  80.0 - 100.0 fL Final  . MCH 04/29/2015 30.3  26.0 - 34.0 pg Final  . MCHC 04/29/2015 33.4  32.0 - 36.0 g/dL Final  . RDW 04/29/2015 19.0* 11.5 - 14.5 % Final  . Platelets 04/29/2015 260  150 - 440 K/uL Final  . Neutrophils Relative % 04/29/2015 53   Final  . Neutro Abs 04/29/2015 2.4  1.4 - 6.5 K/uL Final  . Lymphocytes Relative 04/29/2015 29   Final  . Lymphs Abs 04/29/2015 1.3  1.0 - 3.6 K/uL Final  . Monocytes Relative 04/29/2015 16   Final  . Monocytes Absolute 04/29/2015 0.7  0.2 - 0.9 K/uL Final  . Eosinophils Relative 04/29/2015 1   Final  . Eosinophils Absolute 04/29/2015 0.0  0 - 0.7 K/uL Final  . Basophils Relative 04/29/2015 1   Final  . Basophils Absolute 04/29/2015 0.0  0 - 0.1 K/uL Final  . Sodium 04/29/2015 138  135 - 145 mmol/L Final  . Potassium 04/29/2015 3.7  3.5 - 5.1 mmol/L Final  . Chloride 04/29/2015 107  101 - 111 mmol/L Final  . CO2 04/29/2015 23  22 - 32 mmol/L Final  . Glucose, Bld 04/29/2015 159* 65 - 99 mg/dL Final  . BUN 04/29/2015 11  6 - 20 mg/dL Final  . Creatinine, Ser 04/29/2015 0.79  0.44 - 1.00 mg/dL Final  . Calcium 04/29/2015 10.0  8.9  - 10.3 mg/dL Final  . Total Protein 04/29/2015 7.1  6.5 - 8.1 g/dL Final  . Albumin 04/29/2015 3.2* 3.5 - 5.0 g/dL Final  . AST 04/29/2015 19  15 - 41 U/L Final  . ALT 04/29/2015 12* 14 - 54 U/L Final  . Alkaline Phosphatase 04/29/2015 127* 38 - 126 U/L Final  . Total Bilirubin 04/29/2015 0.7  0.3 - 1.2 mg/dL Final  . GFR calc non Af Amer 04/29/2015 >60  >60 mL/min Final  . GFR calc Af Amer 04/29/2015 >60  >60 mL/min Final   Comment: (NOTE) The eGFR has been calculated using the CKD EPI equation. This calculation has not been validated in all clinical situations. eGFR's persistently <60 mL/min signify possible Chronic Kidney Disease.   . Anion gap 04/29/2015 8  5 - 15 Final  . Magnesium 04/29/2015 1.9  1.7 - 2.4 mg/dL Final    Assessment:  LORETTA DOUTT is a 70 y.o. female with metastatic colon cancer. She presented with a 53 pound weight loss in 6 months. Colonoscopy on 09/07/2014 revealed a circumferential partially obstructive fungating mass in the descending colon. There was no active bleeding. There was a 1.5 cm pedunculated polyp in the sigmoid colon (tubulovillous adenoma). Pathology from the descending colon mass revealed fragments of adenocarcinoma. There was not enough tissue for KRAS testing.  CEA was 11,793 on 09/05/2014.  Chest CT angiogram on 08/28/2014 revealed no evidence of pulmonary embolism, but multiple large enhancing liver masses (largest 6.4 cm).  PET scan on 10/04/2014 revealed 6.5 x 6.1 cm hypermetabolic mass in the proximal sigmoid colon,  There were  numerous large hypermetabolic hepatic metastases. There were no additional sites of metastatic disease in the neck, chest or skeleton.  In addition, there was a 3.2 x 2.8 cm low-attenuation right thyroid nodule.   She is status post 14 cycles of FOLFOX chemotherapy (10/09/2014 - 04/11/2015).  She declined Avastin.  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  CEA was 10,169 on 10/09/2014 ,  12,034 on 0712/2016, 7765 on 11/20/2014, 5154 on 12/04/2014, 1921 on 01/01/2015, 1266 on 01/15/2015, 770.6 on 01/29/2015, 502.7 on 02/12/2015, 368.7 on 02/26/2015, 269 on 03/14/2015, 178.3 on 03/28/2015, and 135.9 on 04/11/2015.  Abdominal and pelvic CT scan on 12/28/2014 revealed response to therapy.  Liver metastases and the descending/sigmoid mass were smaller.  There was no new disease.  Abdomen and pelvic CT scan on 03/25/2015 revealed interval decrease in size of hepatic metastasis and distal descending colon mass.   Symptomatically, she has a grade II neuropathy.  She is eating well and has gained weight.  Exam is stable.    Plan: 1.  Labs today:  CBC with diff, CMP, CEA. 2.  Discuss progressive neuropathy.  Discontinue oxaliplatin.  Plan on reinstituting oxaliplatin with progressive disease if neuropathy resolves.  Patient in agreement. 3.  Cycle #1 5FU/LVtoday. 4.  RTC in 2 days for pump disconnect. 5.  RTC in 2 weeks for MD assess, labs (CBC with diff, CMP, CEA, Mg), and cycle #2 5FU/LV.   Lequita Asal, MD  04/29/2015, 9:51 AM

## 2015-04-30 LAB — CEA: CEA: 101.6 ng/mL — ABNORMAL HIGH (ref 0.0–4.7)

## 2015-05-01 ENCOUNTER — Inpatient Hospital Stay: Payer: Medicare Other

## 2015-05-01 DIAGNOSIS — Z5111 Encounter for antineoplastic chemotherapy: Secondary | ICD-10-CM | POA: Diagnosis not present

## 2015-05-01 DIAGNOSIS — C787 Secondary malignant neoplasm of liver and intrahepatic bile duct: Principal | ICD-10-CM

## 2015-05-01 DIAGNOSIS — C189 Malignant neoplasm of colon, unspecified: Secondary | ICD-10-CM

## 2015-05-01 MED ORDER — HEPARIN SOD (PORK) LOCK FLUSH 100 UNIT/ML IV SOLN
500.0000 [IU] | Freq: Once | INTRAVENOUS | Status: AC | PRN
  Administered 2015-05-01: 500 [IU]

## 2015-05-01 MED ORDER — HEPARIN SOD (PORK) LOCK FLUSH 100 UNIT/ML IV SOLN
INTRAVENOUS | Status: AC
  Filled 2015-05-01: qty 5

## 2015-05-01 MED ORDER — SODIUM CHLORIDE 0.9 % IJ SOLN
10.0000 mL | INTRAMUSCULAR | Status: DC | PRN
  Administered 2015-05-01: 10 mL
  Filled 2015-05-01: qty 10

## 2015-05-10 ENCOUNTER — Other Ambulatory Visit: Payer: Self-pay

## 2015-05-10 DIAGNOSIS — C787 Secondary malignant neoplasm of liver and intrahepatic bile duct: Principal | ICD-10-CM

## 2015-05-10 DIAGNOSIS — C189 Malignant neoplasm of colon, unspecified: Secondary | ICD-10-CM

## 2015-05-13 ENCOUNTER — Other Ambulatory Visit: Payer: Self-pay

## 2015-05-13 ENCOUNTER — Other Ambulatory Visit: Payer: Self-pay | Admitting: Hematology and Oncology

## 2015-05-13 ENCOUNTER — Inpatient Hospital Stay: Payer: Medicare Other

## 2015-05-13 ENCOUNTER — Inpatient Hospital Stay (HOSPITAL_BASED_OUTPATIENT_CLINIC_OR_DEPARTMENT_OTHER): Payer: Medicare Other | Admitting: Hematology and Oncology

## 2015-05-13 VITALS — BP 146/88 | HR 60 | Temp 96.9°F | Resp 18 | Ht 65.0 in | Wt 139.9 lb

## 2015-05-13 DIAGNOSIS — E119 Type 2 diabetes mellitus without complications: Secondary | ICD-10-CM

## 2015-05-13 DIAGNOSIS — K5900 Constipation, unspecified: Secondary | ICD-10-CM

## 2015-05-13 DIAGNOSIS — G629 Polyneuropathy, unspecified: Secondary | ICD-10-CM | POA: Diagnosis not present

## 2015-05-13 DIAGNOSIS — Z5111 Encounter for antineoplastic chemotherapy: Secondary | ICD-10-CM | POA: Diagnosis not present

## 2015-05-13 DIAGNOSIS — C189 Malignant neoplasm of colon, unspecified: Secondary | ICD-10-CM

## 2015-05-13 DIAGNOSIS — Z7982 Long term (current) use of aspirin: Secondary | ICD-10-CM | POA: Diagnosis not present

## 2015-05-13 DIAGNOSIS — Z79899 Other long term (current) drug therapy: Secondary | ICD-10-CM

## 2015-05-13 DIAGNOSIS — G4700 Insomnia, unspecified: Secondary | ICD-10-CM

## 2015-05-13 DIAGNOSIS — Z7984 Long term (current) use of oral hypoglycemic drugs: Secondary | ICD-10-CM

## 2015-05-13 DIAGNOSIS — R634 Abnormal weight loss: Secondary | ICD-10-CM

## 2015-05-13 DIAGNOSIS — M129 Arthropathy, unspecified: Secondary | ICD-10-CM

## 2015-05-13 DIAGNOSIS — E041 Nontoxic single thyroid nodule: Secondary | ICD-10-CM

## 2015-05-13 DIAGNOSIS — C787 Secondary malignant neoplasm of liver and intrahepatic bile duct: Principal | ICD-10-CM

## 2015-05-13 DIAGNOSIS — I10 Essential (primary) hypertension: Secondary | ICD-10-CM

## 2015-05-13 DIAGNOSIS — Z87891 Personal history of nicotine dependence: Secondary | ICD-10-CM

## 2015-05-13 LAB — CBC WITH DIFFERENTIAL/PLATELET
Basophils Absolute: 0 10*3/uL (ref 0–0.1)
Basophils Relative: 0 %
Eosinophils Absolute: 0 10*3/uL (ref 0–0.7)
Eosinophils Relative: 1 %
HCT: 34.4 % — ABNORMAL LOW (ref 35.0–47.0)
Hemoglobin: 11.5 g/dL — ABNORMAL LOW (ref 12.0–16.0)
Lymphocytes Relative: 18 %
Lymphs Abs: 1.1 10*3/uL (ref 1.0–3.6)
MCH: 31.1 pg (ref 26.0–34.0)
MCHC: 33.3 g/dL (ref 32.0–36.0)
MCV: 93.4 fL (ref 80.0–100.0)
Monocytes Absolute: 0.5 10*3/uL (ref 0.2–0.9)
Monocytes Relative: 8 %
Neutro Abs: 4.5 10*3/uL (ref 1.4–6.5)
Neutrophils Relative %: 73 %
Platelets: 171 10*3/uL (ref 150–440)
RBC: 3.68 MIL/uL — ABNORMAL LOW (ref 3.80–5.20)
RDW: 19.5 % — ABNORMAL HIGH (ref 11.5–14.5)
WBC: 6.1 10*3/uL (ref 3.6–11.0)

## 2015-05-13 LAB — COMPREHENSIVE METABOLIC PANEL
ALT: 10 U/L — ABNORMAL LOW (ref 14–54)
AST: 16 U/L (ref 15–41)
Albumin: 3.3 g/dL — ABNORMAL LOW (ref 3.5–5.0)
Alkaline Phosphatase: 121 U/L (ref 38–126)
Anion gap: 8 (ref 5–15)
BUN: 9 mg/dL (ref 6–20)
CO2: 23 mmol/L (ref 22–32)
Calcium: 10.1 mg/dL (ref 8.9–10.3)
Chloride: 108 mmol/L (ref 101–111)
Creatinine, Ser: 0.72 mg/dL (ref 0.44–1.00)
GFR calc Af Amer: >60 mL/min (ref >60)
GFR calc non Af Amer: >60 mL/min (ref >60)
Glucose, Bld: 173 mg/dL — ABNORMAL HIGH (ref 65–99)
Potassium: 3.7 mmol/L (ref 3.5–5.1)
Sodium: 139 mmol/L (ref 135–145)
Total Bilirubin: 0.7 mg/dL (ref 0.3–1.2)
Total Protein: 7.1 g/dL (ref 6.5–8.1)

## 2015-05-13 LAB — MAGNESIUM: Magnesium: 1.9 mg/dL (ref 1.7–2.4)

## 2015-05-13 MED ORDER — DEXTROSE 5 % IV SOLN
Freq: Once | INTRAVENOUS | Status: DC
  Filled 2015-05-13: qty 1000

## 2015-05-13 MED ORDER — HEPARIN SOD (PORK) LOCK FLUSH 100 UNIT/ML IV SOLN: 500.0000 [IU] | Freq: Once | INTRAVENOUS | Status: DC | PRN

## 2015-05-13 MED ORDER — SODIUM CHLORIDE 0.9 % IV SOLN
2400.0000 mg/m2 | INTRAVENOUS | Status: DC
  Administered 2015-05-13: 3900 mg via INTRAVENOUS
  Filled 2015-05-13: qty 78

## 2015-05-13 MED ORDER — FLUOROURACIL CHEMO INJECTION 2.5 GM/50ML
400.0000 mg/m2 | Freq: Once | INTRAVENOUS | Status: AC
  Administered 2015-05-13: 650 mg via INTRAVENOUS
  Filled 2015-05-13: qty 13

## 2015-05-13 MED ORDER — LEUCOVORIN CALCIUM INJECTION 100 MG
20.0000 mg/m2 | Freq: Once | INTRAMUSCULAR | Status: AC
  Administered 2015-05-13: 32 mg via INTRAVENOUS
  Filled 2015-05-13: qty 1.6

## 2015-05-13 MED ORDER — LEUCOVORIN CALCIUM INJECTION 350 MG: 650.0000 mg | Freq: Once | INTRAVENOUS | Status: DC

## 2015-05-13 MED ORDER — SODIUM CHLORIDE 0.9 % IV SOLN
Freq: Once | INTRAVENOUS | Status: AC
  Administered 2015-05-13: 11:00:00 via INTRAVENOUS
  Filled 2015-05-13: qty 4

## 2015-05-13 MED ORDER — SODIUM CHLORIDE 0.9 % IJ SOLN
10.0000 mL | INTRAMUSCULAR | Status: DC | PRN
  Administered 2015-05-13: 10 mL
  Filled 2015-05-13: qty 10

## 2015-05-13 MED ORDER — SODIUM CHLORIDE 0.9 % IV SOLN
INTRAVENOUS | Status: DC
  Administered 2015-05-13: 11:00:00 via INTRAVENOUS
  Filled 2015-05-13: qty 1000

## 2015-05-13 MED ORDER — GABAPENTIN 100 MG PO CAPS: 100.0000 mg | ORAL_CAPSULE | Freq: Every day | ORAL | Status: DC

## 2015-05-13 MED ORDER — OXYCODONE-ACETAMINOPHEN 5-325 MG PO TABS: 1.0000 | ORAL_TABLET | Freq: Four times a day (QID) | ORAL | Status: DC | PRN

## 2015-05-13 NOTE — Progress Notes (Signed)
[facility] day:  05/13/2015  Chief Complaint: Sandy [name] is a 70 y.o. female with stage IV colon cancer who is seen for assessment prior to cycle #2 FOLFOX chemotherapy.  HPI: The patient was last seen in the medical oncology clinic on 04/29/2015.  At that time, because of a grade II neuropathy, oxaliplatin was held.  She received 5-FU and leucovorin alone.  During the interim, she notes that her neuropathy is bad. She states that dropping things and signs can't pick things up. She notes some pain.  Her discomfort does not keep her up at night. She notes her hands are sensitive to cold. She notes some knee problems. She denies any abdominal pain or diarrhea. She has gained a pound.  Past Medical History  Diagnosis Date  . Hypertension   . Arthritis   . Constipation   . Insomnia   . Diabetes mellitus without complication ([place])   . Cancer [facility])     liver mets; colon cancer    Past Surgical History  Procedure Laterality Date  . Colonoscopy with propofol N/A 09/07/2014    Procedure: COLONOSCOPY WITH PROPOFOL;  Surgeon: Lucilla Lame, MD;  Location: [facility] ENDOSCOPY;  Service: Endoscopy;  Laterality: N/A;  . Esophagogastroduodenoscopy N/A 09/07/2014    Procedure: ESOPHAGOGASTRODUODENOSCOPY (EGD);  Surgeon: Lucilla Lame, MD;  Location: [facility] ENDOSCOPY;  Service: Endoscopy;  Laterality: N/A;  . Portacath placement Left 09/24/2014    Procedure: INSERTION PORT-A-CATH;  Surgeon: Robert Bellow, MD;  Location: [facility] ORS;  Service: General;  Laterality: Left;  . Liver biopsy      Family History  Problem Relation Age of Onset  . Cancer Sister     Social History:  reports that she quit smoking about 12 years ago. Her smoking use included Cigarettes. She has a 5 pack-year smoking history. She has never used smokeless tobacco. She reports that she does not drink alcohol or use illicit drugs.  The patient is accompanied by her daughter.    Allergies: No Known Allergies  Current Medications: Current Outpatient Prescriptions  Medication Sig Dispense Refill  . amLODipine (NORVASC) 5 MG tablet Take 5 mg by mouth daily.    Marland Kitchen aspirin EC 81 MG tablet Take 81 mg by mouth daily.    Marland Kitchen atorvastatin (LIPITOR) 40 MG tablet Take 40 mg by mouth daily.    Marland Kitchen lidocaine-prilocaine (EMLA) cream Apply 1 application topically as needed. 30 g 1  . lisinopril-hydrochlorothiazide (PRINZIDE,ZESTORETIC) 20-25 MG per tablet Take 1 tablet by mouth daily.    . megestrol (MEGACE) 400 MG/10ML suspension Take 5 mLs (200 mg total) by mouth daily. to stimulate appetite 240 mL 0  . metFORMIN (GLUCOPHAGE) 1000 MG tablet Take 1,000 mg by mouth 2 (two) times daily.     . metoprolol succinate (TOPROL-XL) 25 MG 24 hr tablet Take 25 mg by mouth daily.    . Multiple Vitamin (MULTIVITAMIN WITH MINERALS) TABS tablet Take 1 tablet by mouth daily.    . ondansetron (ZOFRAN) 4 MG tablet Take 1 tablet (4 mg total) by mouth every 8 (eight) hours as needed for nausea or vomiting. 30 tablet 2  . oxyCODONE-acetaminophen (PERCOCET/ROXICET) 5-325 MG tablet Take 1 tablet by mouth every 6 (six) hours as needed for severe pain. 30 tablet 0  . potassium chloride (K-DUR,KLOR-CON) 10 MEQ tablet Take 1 tablet (10 mEq total) by mouth once. Take 1 pill twice a day for 3 days then 1 pill a day. 30 tablet 0  .  Sennosides-Docusate Sodium (STOOL SOFTENER LAXATIVE PO) Take 1 tablet by mouth 2 (two) times daily.     Marland Kitchen zolpidem (AMBIEN) 10 MG tablet Take 1 tablet by mouth at bedtime as needed.     No current facility-administered medications for this visit.   Facility-Administered Medications Ordered in Other Visits  Medication Dose Route Frequency Provider Last Rate Last Dose  . acetaminophen (TYLENOL) tablet 650 mg  650 mg Oral Once Lequita Asal, MD   650 mg at 10/19/14 8127  . sodium chloride 0.9 % injection 10 mL  10 mL Intracatheter PRN Lequita Asal, MD   10 mL at 10/11/14  1431  . sodium chloride 0.9 % injection 10 mL  10 mL Intracatheter PRN Lequita Asal, MD   10 mL at 02/28/15 1529    Review of Systems:  GENERAL: Feels "fine". No fevers or sweats.  Weight stable. PERFORMANCE STATUS (ECOG): 2. HEENT: Wears a hearing aide. No visual changes, runny nose, sore throat, mouth sores or tenderness. Lungs: No shortness of breath or cough. No hemoptysis. Cardiac: No chest pain, palpitations, orthopnea, or PND. GI:  Constipation on Senna.  No nausea, vomiting, diarrhea, melena or hematochezia. GU: Wears a Depends pad.  No dysuria or hematuria. Musculoskeletal: Denies back pain. No joint pain. No muscle tenderness. Extremities: No pain or swelling. Skin: No rashes, skin lesions, or ulcers Neuro:  Neuropathy in fingers (worse).  Fingers sensitive to cold.  No headache, numbness or weakness, or balance issues. Endocrine: Diabetes.  No thyroid issues, hot flashes or night sweats. Psych: No mood changes, depression or anxiety. Pain: No pain. Review of systems: All other systems reviewed and found to be negative.  Physical Exam: Blood pressure 146/88, pulse 60, temperature 96.9 F (36.1 C), temperature source Tympanic, resp. rate 18, height 5' 5"  (1.651 m), weight 139 lb 14.1 oz (63.45 kg). GENERAL: Elderly woman sitting comfortably in a wheelchair in the exam room in no acute distress. MENTAL STATUS: Alert and oriented to person, place and time. HEAD: Styled curly brown hair. Normocephalic, atraumatic, face symmetric, no Cushingoid features. EYES: Black rimmed glasses. Hazel/green eyes. Pupils equal round and reactive to light and accomodation. No conjunctivitis or scleral icterus. ENT: Oropharynx clear without lesion. Partial. Tongue normal. Mucous membranes moist.  RESPIRATORY: Clear to auscultation without rales, wheezes or rhonchi. CARDIOVASCULAR: Regular rate and rhythm without murmur, rub or gallop. ABDOMEN: Soft, non-tender  with active bowel sounds. No guarding or rebound tenderness. No hepatosplenomegaly. SKIN: No rashes, ulcer or skin lesions.  EXTREMITIES: No edema, no skin discoloration or tenderness. No palpable cords. LYMPH NODES: No palpable cervical, supraclavicular, axillary or inguinal adenopathy  NEUROLOGICAL: Unremarkable.  Gait slightly unsteady. PSYCH: Appropriate.  Appointment on 05/13/2015  Component Date Value Ref Range Status  . WBC 05/13/2015 6.1  3.6 - 11.0 K/uL Final  . RBC 05/13/2015 3.68* 3.80 - 5.20 MIL/uL Final  . Hemoglobin 05/13/2015 11.5* 12.0 - 16.0 g/dL Final  . HCT 05/13/2015 34.4* 35.0 - 47.0 % Final  . MCV 05/13/2015 93.4  80.0 - 100.0 fL Final  . MCH 05/13/2015 31.1  26.0 - 34.0 pg Final  . MCHC 05/13/2015 33.3  32.0 - 36.0 g/dL Final  . RDW 05/13/2015 19.5* 11.5 - 14.5 % Final  . Platelets 05/13/2015 171  150 - 440 K/uL Final  . Neutrophils Relative % 05/13/2015 73   Final  . Neutro Abs 05/13/2015 4.5  1.4 - 6.5 K/uL Final  . Lymphocytes Relative 05/13/2015 18   Final  .  Lymphs Abs 05/13/2015 1.1  1.0 - 3.6 K/uL Final  . Monocytes Relative 05/13/2015 8   Final  . Monocytes Absolute 05/13/2015 0.5  0.2 - 0.9 K/uL Final  . Eosinophils Relative 05/13/2015 1   Final  . Eosinophils Absolute 05/13/2015 0.0  0 - 0.7 K/uL Final  . Basophils Relative 05/13/2015 0   Final  . Basophils Absolute 05/13/2015 0.0  0 - 0.1 K/uL Final  . Sodium 05/13/2015 139  135 - 145 mmol/L Final  . Potassium 05/13/2015 3.7  3.5 - 5.1 mmol/L Final  . Chloride 05/13/2015 108  101 - 111 mmol/L Final  . CO2 05/13/2015 23  22 - 32 mmol/L Final  . Glucose, Bld 05/13/2015 173* 65 - 99 mg/dL Final  . BUN 05/13/2015 9  6 - 20 mg/dL Final  . Creatinine, Ser 05/13/2015 0.72  0.44 - 1.00 mg/dL Final  . Calcium 05/13/2015 10.1  8.9 - 10.3 mg/dL Final  . Total Protein 05/13/2015 7.1  6.5 - 8.1 g/dL Final  . Albumin 05/13/2015 3.3* 3.5 - 5.0 g/dL Final  . AST 05/13/2015 16  15 - 41 U/L Final  . ALT  05/13/2015 10* 14 - 54 U/L Final  . Alkaline Phosphatase 05/13/2015 121  38 - 126 U/L Final  . Total Bilirubin 05/13/2015 0.7  0.3 - 1.2 mg/dL Final  . GFR calc non Af Amer 05/13/2015 >60  >60 mL/min Final  . GFR calc Af Amer 05/13/2015 >60  >60 mL/min Final   Comment: (NOTE) The eGFR has been calculated using the CKD EPI equation. This calculation has not been validated in all clinical situations. eGFR's persistently <60 mL/min signify possible Chronic Kidney Disease.   . Anion gap 05/13/2015 8  5 - 15 Final  . Magnesium 05/13/2015 1.9  1.7 - 2.4 mg/dL Final    Assessment:  JUPITER BOYS is a 70 y.o. female with metastatic colon cancer. She presented with a 53 pound weight loss in 6 months. Colonoscopy on 09/07/2014 revealed a circumferential partially obstructive fungating mass in the descending colon. There was no active bleeding. There was a 1.5 cm pedunculated polyp in the sigmoid colon (tubulovillous adenoma). Pathology from the descending colon mass revealed fragments of adenocarcinoma. There was not enough tissue for KRAS testing.  CEA was 11,793 on 09/05/2014.  Chest CT angiogram on 08/28/2014 revealed no evidence of pulmonary embolism, but multiple large enhancing liver masses (largest 6.4 cm).  PET scan on 10/04/2014 revealed 6.5 x 6.1 cm hypermetabolic mass in the proximal sigmoid colon,  There were  numerous large hypermetabolic hepatic metastases. There were no additional sites of metastatic disease in the neck, chest or skeleton.  In addition, there was a 3.2 x 2.8 cm low-attenuation right thyroid nodule.   She is status post 14 cycles of FOLFOX chemotherapy (10/09/2014 - 04/11/2015).  She declined Avastin.  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  CEA was 10,169 on 10/09/2014 , 12,034 on 0712/2016, 7765 on 11/20/2014, 5154 on 12/04/2014, 1921 on 01/01/2015, 1266 on 01/15/2015, 770.6 on 01/29/2015, 502.7 on 02/12/2015, 368.7 on 02/26/2015, 269 on  03/14/2015, 178.3 on 03/28/2015, 135.9 on 04/11/2015, and 116.1 on 05/13/2015.  Abdominal and pelvic CT scan on 12/28/2014 revealed response to therapy.  Liver metastases and the descending/sigmoid mass were smaller.  There was no new disease.  Abdomen and pelvic CT scan on 03/25/2015 revealed interval decrease in size of hepatic metastasis and distal descending colon mass.   She is status post 1 cycle  of 5-fluorouracil and leucovorin (04/28/2014) secondary to oxaliplatin neuropathy.  Symptomatically, she has a grade II-III neuropathy.  She is eating well.  Weight is stable.  Exam is stable.    Plan: 1.  Labs today:  CBC with diff, CMP, Mg, CEA. 2.  Cycle #1 5FU/LVtoday. 3.  RTC in 2 days for pump disconnect. 4.  Begin Neurontin 100 mg po qhs.  Increase dose every 3-5 days based on tolerance. 5.  RTC in 2 weeks for MD assess, labs (CBC with diff, CMP, CEA, Mg), and cycle #3 5FU/LV.   Lequita Asal, MD  05/13/2015, 10:03 AM

## 2015-05-14 LAB — CEA: CEA: 116.1 ng/mL — ABNORMAL HIGH (ref 0.0–4.7)

## 2015-05-15 ENCOUNTER — Inpatient Hospital Stay: Payer: Medicare Other | Attending: Hematology and Oncology

## 2015-05-15 DIAGNOSIS — E041 Nontoxic single thyroid nodule: Secondary | ICD-10-CM | POA: Diagnosis not present

## 2015-05-15 DIAGNOSIS — Z7984 Long term (current) use of oral hypoglycemic drugs: Secondary | ICD-10-CM | POA: Diagnosis not present

## 2015-05-15 DIAGNOSIS — G4700 Insomnia, unspecified: Secondary | ICD-10-CM | POA: Diagnosis not present

## 2015-05-15 DIAGNOSIS — I10 Essential (primary) hypertension: Secondary | ICD-10-CM | POA: Diagnosis not present

## 2015-05-15 DIAGNOSIS — Z5111 Encounter for antineoplastic chemotherapy: Secondary | ICD-10-CM | POA: Diagnosis not present

## 2015-05-15 DIAGNOSIS — K5900 Constipation, unspecified: Secondary | ICD-10-CM | POA: Diagnosis not present

## 2015-05-15 DIAGNOSIS — M129 Arthropathy, unspecified: Secondary | ICD-10-CM | POA: Insufficient documentation

## 2015-05-15 DIAGNOSIS — G629 Polyneuropathy, unspecified: Secondary | ICD-10-CM | POA: Diagnosis not present

## 2015-05-15 DIAGNOSIS — Z87891 Personal history of nicotine dependence: Secondary | ICD-10-CM | POA: Diagnosis not present

## 2015-05-15 DIAGNOSIS — K625 Hemorrhage of anus and rectum: Secondary | ICD-10-CM | POA: Diagnosis not present

## 2015-05-15 DIAGNOSIS — Z7982 Long term (current) use of aspirin: Secondary | ICD-10-CM | POA: Diagnosis not present

## 2015-05-15 DIAGNOSIS — C189 Malignant neoplasm of colon, unspecified: Secondary | ICD-10-CM | POA: Insufficient documentation

## 2015-05-15 DIAGNOSIS — C801 Malignant (primary) neoplasm, unspecified: Secondary | ICD-10-CM

## 2015-05-15 DIAGNOSIS — Z79899 Other long term (current) drug therapy: Secondary | ICD-10-CM | POA: Insufficient documentation

## 2015-05-15 DIAGNOSIS — E119 Type 2 diabetes mellitus without complications: Secondary | ICD-10-CM | POA: Insufficient documentation

## 2015-05-15 DIAGNOSIS — C787 Secondary malignant neoplasm of liver and intrahepatic bile duct: Secondary | ICD-10-CM | POA: Diagnosis not present

## 2015-05-15 MED ORDER — HEPARIN SOD (PORK) LOCK FLUSH 100 UNIT/ML IV SOLN
500.0000 [IU] | Freq: Once | INTRAVENOUS | Status: AC
  Administered 2015-05-15: 500 [IU] via INTRAVENOUS
  Filled 2015-05-15: qty 5

## 2015-05-21 ENCOUNTER — Encounter: Payer: Self-pay | Admitting: Hematology and Oncology

## 2015-05-27 ENCOUNTER — Inpatient Hospital Stay: Payer: Medicare Other

## 2015-05-27 ENCOUNTER — Inpatient Hospital Stay (HOSPITAL_BASED_OUTPATIENT_CLINIC_OR_DEPARTMENT_OTHER): Payer: Medicare Other | Admitting: Internal Medicine

## 2015-05-27 ENCOUNTER — Other Ambulatory Visit: Payer: Self-pay | Admitting: Hematology and Oncology

## 2015-05-27 ENCOUNTER — Encounter: Payer: Self-pay | Admitting: Hematology and Oncology

## 2015-05-27 ENCOUNTER — Telehealth: Payer: Self-pay

## 2015-05-27 VITALS — BP 131/81 | HR 97 | Temp 95.1°F | Resp 18 | Ht 65.0 in | Wt 144.6 lb

## 2015-05-27 DIAGNOSIS — E041 Nontoxic single thyroid nodule: Secondary | ICD-10-CM

## 2015-05-27 DIAGNOSIS — C787 Secondary malignant neoplasm of liver and intrahepatic bile duct: Secondary | ICD-10-CM

## 2015-05-27 DIAGNOSIS — Z79899 Other long term (current) drug therapy: Secondary | ICD-10-CM

## 2015-05-27 DIAGNOSIS — C189 Malignant neoplasm of colon, unspecified: Secondary | ICD-10-CM

## 2015-05-27 DIAGNOSIS — Z87891 Personal history of nicotine dependence: Secondary | ICD-10-CM

## 2015-05-27 DIAGNOSIS — Z7982 Long term (current) use of aspirin: Secondary | ICD-10-CM

## 2015-05-27 DIAGNOSIS — Z5111 Encounter for antineoplastic chemotherapy: Secondary | ICD-10-CM | POA: Diagnosis not present

## 2015-05-27 DIAGNOSIS — Z7984 Long term (current) use of oral hypoglycemic drugs: Secondary | ICD-10-CM

## 2015-05-27 DIAGNOSIS — G4700 Insomnia, unspecified: Secondary | ICD-10-CM

## 2015-05-27 DIAGNOSIS — I10 Essential (primary) hypertension: Secondary | ICD-10-CM

## 2015-05-27 DIAGNOSIS — K5900 Constipation, unspecified: Secondary | ICD-10-CM

## 2015-05-27 DIAGNOSIS — E119 Type 2 diabetes mellitus without complications: Secondary | ICD-10-CM

## 2015-05-27 DIAGNOSIS — M129 Arthropathy, unspecified: Secondary | ICD-10-CM

## 2015-05-27 LAB — COMPREHENSIVE METABOLIC PANEL
ALT: 9 U/L — ABNORMAL LOW (ref 14–54)
AST: 14 U/L — ABNORMAL LOW (ref 15–41)
Albumin: 3.4 g/dL — ABNORMAL LOW (ref 3.5–5.0)
Alkaline Phosphatase: 108 U/L (ref 38–126)
Anion gap: 8 (ref 5–15)
BUN: 12 mg/dL (ref 6–20)
CO2: 21 mmol/L — ABNORMAL LOW (ref 22–32)
Calcium: 10 mg/dL (ref 8.9–10.3)
Chloride: 109 mmol/L (ref 101–111)
Creatinine, Ser: 0.72 mg/dL (ref 0.44–1.00)
GFR calc Af Amer: >60 mL/min (ref >60)
GFR calc non Af Amer: >60 mL/min (ref >60)
Glucose, Bld: 143 mg/dL — ABNORMAL HIGH (ref 65–99)
Potassium: 3.8 mmol/L (ref 3.5–5.1)
Sodium: 138 mmol/L (ref 135–145)
Total Bilirubin: 0.8 mg/dL (ref 0.3–1.2)
Total Protein: 6.9 g/dL (ref 6.5–8.1)

## 2015-05-27 LAB — CBC WITH DIFFERENTIAL/PLATELET
Basophils Absolute: 0 10*3/uL (ref 0–0.1)
Basophils Relative: 0 %
Eosinophils Absolute: 0.1 10*3/uL (ref 0–0.7)
Eosinophils Relative: 1 %
HCT: 33.8 % — ABNORMAL LOW (ref 35.0–47.0)
Hemoglobin: 11.4 g/dL — ABNORMAL LOW (ref 12.0–16.0)
Lymphocytes Relative: 18 %
Lymphs Abs: 1.4 10*3/uL (ref 1.0–3.6)
MCH: 31.3 pg (ref 26.0–34.0)
MCHC: 33.6 g/dL (ref 32.0–36.0)
MCV: 93.2 fL (ref 80.0–100.0)
Monocytes Absolute: 0.6 10*3/uL (ref 0.2–0.9)
Monocytes Relative: 8 %
Neutro Abs: 5.7 10*3/uL (ref 1.4–6.5)
Neutrophils Relative %: 73 %
Platelets: 210 10*3/uL (ref 150–440)
RBC: 3.62 MIL/uL — ABNORMAL LOW (ref 3.80–5.20)
RDW: 18.7 % — ABNORMAL HIGH (ref 11.5–14.5)
WBC: 7.8 10*3/uL (ref 3.6–11.0)

## 2015-05-27 LAB — MAGNESIUM: Magnesium: 1.7 mg/dL (ref 1.7–2.4)

## 2015-05-27 MED ORDER — DEXTROSE 5 % IV SOLN
Freq: Once | INTRAVENOUS | Status: AC
  Administered 2015-05-27: 11:00:00 via INTRAVENOUS
  Filled 2015-05-27: qty 1000

## 2015-05-27 MED ORDER — LEUCOVORIN CALCIUM INJECTION 100 MG
20.0000 mg/m2 | Freq: Once | INTRAMUSCULAR | Status: AC
  Administered 2015-05-27: 32 mg via INTRAVENOUS
  Filled 2015-05-27: qty 1.6

## 2015-05-27 MED ORDER — SODIUM CHLORIDE 0.9 % IV SOLN
2400.0000 mg/m2 | INTRAVENOUS | Status: DC
  Administered 2015-05-27: 3900 mg via INTRAVENOUS
  Filled 2015-05-27: qty 78

## 2015-05-27 MED ORDER — LEUCOVORIN CALCIUM INJECTION 350 MG: 650.0000 mg | Freq: Once | INTRAVENOUS | Status: DC

## 2015-05-27 MED ORDER — FLUOROURACIL CHEMO INJECTION 2.5 GM/50ML
400.0000 mg/m2 | Freq: Once | INTRAVENOUS | Status: AC
  Administered 2015-05-27: 650 mg via INTRAVENOUS
  Filled 2015-05-27: qty 13

## 2015-05-27 MED ORDER — SODIUM CHLORIDE 0.9 % IV SOLN
Freq: Once | INTRAVENOUS | Status: AC
  Administered 2015-05-27: 11:00:00 via INTRAVENOUS
  Filled 2015-05-27: qty 4

## 2015-05-27 MED ORDER — SODIUM CHLORIDE 0.9 % IJ SOLN
10.0000 mL | INTRAMUSCULAR | Status: DC | PRN
  Filled 2015-05-27: qty 10

## 2015-05-27 MED ORDER — SODIUM CHLORIDE 0.9% FLUSH
10.0000 mL | INTRAVENOUS | Status: DC | PRN
Start: 2015-05-27 — End: 2015-05-27
  Administered 2015-05-27: 10 mL via INTRAVENOUS
  Filled 2015-05-27: qty 10

## 2015-05-27 MED ORDER — HEPARIN SOD (PORK) LOCK FLUSH 100 UNIT/ML IV SOLN: 500.0000 [IU] | Freq: Once | INTRAVENOUS | Status: DC

## 2015-05-27 NOTE — Progress Notes (Signed)
Pt has no concerns and no changes

## 2015-05-27 NOTE — Progress Notes (Signed)
[facility] day:  05/27/2015  Chief Complaint: Sandy [name] is a 70 y.o. 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  HPI:  Sandy [name] returns to our clinic for a follow-up visit. She has done reasonably well since her last appointment. She denies any abdominal pain, diarrhea, nausea, vomiting, bleeding. 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 Initially she felt sleepy in the evening, however she has had difficult time sleeping over the past few nights. She has to use wheelchair for long-distance troubles. She does not like to use walker or a cane.  Past Medical History  Diagnosis Date  . Hypertension   . Arthritis   . Constipation   . Insomnia   . Diabetes mellitus without complication ([place])   . Cancer [facility])     liver mets; colon cancer    Past Surgical History  Procedure Laterality Date  . Colonoscopy with propofol N/A 09/07/2014    Procedure: COLONOSCOPY WITH PROPOFOL;  Surgeon: Lucilla Lame, MD;  Location: [facility] ENDOSCOPY;  Service: Endoscopy;  Laterality: N/A;  . Esophagogastroduodenoscopy N/A 09/07/2014    Procedure: ESOPHAGOGASTRODUODENOSCOPY (EGD);  Surgeon: Lucilla Lame, MD;  Location: [facility] ENDOSCOPY;  Service: Endoscopy;  Laterality: N/A;  . Portacath placement Left 09/24/2014    Procedure: INSERTION PORT-A-CATH;  Surgeon: Robert Bellow, MD;  Location: [facility] ORS;  Service: General;  Laterality: Left;  . Liver biopsy      Family History  Problem Relation Age of Onset  . Cancer Sister     Social History:  reports that she quit smoking about 12 years ago. Her smoking use included Cigarettes. She has a 5 pack-year smoking history. She has never used smokeless tobacco. She reports that she does  not drink alcohol or use illicit drugs.  The patient is accompanied by her daughter.   Allergies: No Known Allergies  Current Medications: Current Outpatient Prescriptions  Medication Sig Dispense Refill  . amLODipine (NORVASC) 5 MG tablet Take 5 mg by mouth daily.    Marland Kitchen aspirin EC 81 MG tablet Take 81 mg by mouth daily.    Marland Kitchen atorvastatin (LIPITOR) 40 MG tablet Take 40 mg by mouth daily.    Marland Kitchen gabapentin (NEURONTIN) 100 MG capsule Take 1 capsule (100 mg total) by mouth at bedtime. 60 capsule 0  . lidocaine-prilocaine (EMLA) cream Apply 1 application topically as needed. 30 g 1  . lisinopril-hydrochlorothiazide (PRINZIDE,ZESTORETIC) 20-25 MG per tablet Take 1 tablet by mouth daily.    . megestrol (MEGACE) 400 MG/10ML suspension Take 5 mLs (200 mg total) by mouth daily. to stimulate appetite 240 mL 0  . metFORMIN (GLUCOPHAGE) 1000 MG tablet Take 1,000 mg by mouth 2 (two) times daily.     . metoprolol succinate (TOPROL-XL) 25 MG 24 hr tablet Take 25 mg by mouth daily.    . Multiple Vitamin (MULTIVITAMIN WITH MINERALS) TABS tablet Take 1 tablet by mouth daily.    . ondansetron (ZOFRAN) 4 MG tablet Take 1 tablet (4 mg total) by mouth every 8 (eight) hours as needed for nausea or vomiting. 30 tablet 2  . oxyCODONE-acetaminophen (PERCOCET/ROXICET) 5-325 MG tablet Take 1 tablet by mouth every 6 (six) hours as needed for severe pain. 30 tablet 0  . potassium chloride (K-DUR,KLOR-CON) 10 MEQ tablet Take 1 tablet (10  mEq total) by mouth once. Take 1 pill twice a day for 3 days then 1 pill a day. 30 tablet 0  . Sennosides-Docusate Sodium (STOOL SOFTENER LAXATIVE PO) Take 1 tablet by mouth 2 (two) times daily.     Marland Kitchen zolpidem (AMBIEN) 10 MG tablet Take 1 tablet by mouth at bedtime as needed.     No current facility-administered medications for this visit.   Facility-Administered Medications Ordered in Other Visits  Medication Dose Route Frequency Provider Last Rate Last Dose  . acetaminophen (TYLENOL)  tablet 650 mg  650 mg Oral Once Lequita Asal, MD   650 mg at 10/19/14 3710  . heparin lock flush 100 unit/mL  500 Units Intravenous Once Lequita Asal, MD      . sodium chloride 0.9 % injection 10 mL  10 mL Intracatheter PRN Lequita Asal, MD   10 mL at 10/11/14 1431  . sodium chloride 0.9 % injection 10 mL  10 mL Intracatheter PRN Lequita Asal, MD   10 mL at 02/28/15 1529  . sodium chloride flush (NS) 0.9 % injection 10 mL  10 mL Intravenous PRN Lequita Asal, MD        Review of Systems:  GENERAL: Feels "fine". No fevers or sweats.  Weight stable. PERFORMANCE STATUS (ECOG): 2. HEENT: Wears a hearing aide. No visual changes, runny nose, sore throat, mouth sores or tenderness. Lungs: No shortness of breath or cough. No hemoptysis. Cardiac: No chest pain, palpitations, orthopnea, or PND. GI:  Constipation on Senna.  No nausea, vomiting, diarrhea, melena or hematochezia. GU: Wears a Depends pad.  No dysuria or hematuria. Musculoskeletal: Denies back pain. No joint pain. No muscle tenderness. Extremities: No pain or swelling. Skin: No rashes, skin lesions, or ulcers Neuro:  Neuropathy in fingers (worse).  Fingers sensitive to cold.  No headache, numbness or weakness, or balance issues. Endocrine: Diabetes.  No thyroid issues, hot flashes or night sweats. Psych: No mood changes, depression or anxiety. Pain: No pain. Review of systems: All other systems reviewed and found to be negative.  Physical Exam: There were no vitals taken for this visit. GENERAL: Elderly woman sitting comfortably in a wheelchair in the exam room in no acute distress. MENTAL STATUS: Alert and oriented to person, place and time. HEAD: Styled curly brown hair. Normocephalic, atraumatic, face symmetric, no Cushingoid features. EYES: Black rimmed glasses. Hazel/green eyes. Pupils equal round and reactive to light and accomodation. No conjunctivitis or scleral  icterus. ENT: Oropharynx clear without lesion. Partial. Tongue normal. Mucous membranes moist.  RESPIRATORY: Clear to auscultation without rales, wheezes or rhonchi. CARDIOVASCULAR: Regular rate and rhythm without murmur, rub or gallop. ABDOMEN: Soft, non-tender with active bowel sounds. No guarding or rebound tenderness. No hepatosplenomegaly. SKIN: No rashes, ulcer or skin lesions.  EXTREMITIES: No edema, no skin discoloration or tenderness. No palpable cords. LYMPH NODES: No palpable cervical, supraclavicular, axillary or inguinal adenopathy  NEUROLOGICAL: Unremarkable.  Gait slightly unsteady. PSYCH: Appropriate.  No visits with results within 3 Day(s) from this visit. Latest known visit with results is:  Appointment on 05/13/2015  Component Date Value Ref Range Status  . WBC 05/13/2015 6.1  3.6 - 11.0 K/uL Final  . RBC 05/13/2015 3.68* 3.80 - 5.20 MIL/uL Final  . Hemoglobin 05/13/2015 11.5* 12.0 - 16.0 g/dL Final  . HCT 05/13/2015 34.4* 35.0 - 47.0 % Final  . MCV 05/13/2015 93.4  80.0 - 100.0 fL Final  . MCH 05/13/2015 31.1  26.0 - 34.0 pg  Final  . MCHC 05/13/2015 33.3  32.0 - 36.0 g/dL Final  . RDW 05/13/2015 19.5* 11.5 - 14.5 % Final  . Platelets 05/13/2015 171  150 - 440 K/uL Final  . Neutrophils Relative % 05/13/2015 73   Final  . Neutro Abs 05/13/2015 4.5  1.4 - 6.5 K/uL Final  . Lymphocytes Relative 05/13/2015 18   Final  . Lymphs Abs 05/13/2015 1.1  1.0 - 3.6 K/uL Final  . Monocytes Relative 05/13/2015 8   Final  . Monocytes Absolute 05/13/2015 0.5  0.2 - 0.9 K/uL Final  . Eosinophils Relative 05/13/2015 1   Final  . Eosinophils Absolute 05/13/2015 0.0  0 - 0.7 K/uL Final  . Basophils Relative 05/13/2015 0   Final  . Basophils Absolute 05/13/2015 0.0  0 - 0.1 K/uL Final  . Sodium 05/13/2015 139  135 - 145 mmol/L Final  . Potassium 05/13/2015 3.7  3.5 - 5.1 mmol/L Final  . Chloride 05/13/2015 108  101 - 111 mmol/L Final  . CO2 05/13/2015 23  22 - 32 mmol/L  Final  . Glucose, Bld 05/13/2015 173* 65 - 99 mg/dL Final  . BUN 05/13/2015 9  6 - 20 mg/dL Final  . Creatinine, Ser 05/13/2015 0.72  0.44 - 1.00 mg/dL Final  . Calcium 05/13/2015 10.1  8.9 - 10.3 mg/dL Final  . Total Protein 05/13/2015 7.1  6.5 - 8.1 g/dL Final  . Albumin 05/13/2015 3.3* 3.5 - 5.0 g/dL Final  . AST 05/13/2015 16  15 - 41 U/L Final  . ALT 05/13/2015 10* 14 - 54 U/L Final  . Alkaline Phosphatase 05/13/2015 121  38 - 126 U/L Final  . Total Bilirubin 05/13/2015 0.7  0.3 - 1.2 mg/dL Final  . GFR calc non Af Amer 05/13/2015 >60  >60 mL/min Final  . GFR calc Af Amer 05/13/2015 >60  >60 mL/min Final   Comment: (NOTE) The eGFR has been calculated using the CKD EPI equation. This calculation has not been validated in all clinical situations. eGFR's persistently <60 mL/min signify possible Chronic Kidney Disease.   . Anion gap 05/13/2015 8  5 - 15 Final  . CEA 05/13/2015 116.1* 0.0 - 4.7 ng/mL Final   Comment: (NOTE)       Roche ECLIA methodology       Nonsmokers  <3.9                                     Smokers     <5.6 Performed At: [facility] [place], [place] [id number] Lindon Romp MD [id number]   . Magnesium 05/13/2015 1.9  1.7 - 2.4 mg/dL Final    Assessment:  Sandy [name] is a 70 y.o. female with metastatic colon cancer. She presented with a 53 pound weight loss in 6 months. Colonoscopy on 09/07/2014 revealed a circumferential partially obstructive fungating mass in the descending colon. There was no active bleeding. There was a 1.5 cm pedunculated polyp in the sigmoid colon (tubulovillous adenoma). Pathology from the descending colon mass revealed fragments of adenocarcinoma. There was not enough tissue for KRAS testing.  CEA was 11,793 on 09/05/2014.  Chest CT angiogram on 08/28/2014 revealed no evidence of pulmonary embolism, but multiple large enhancing liver masses (largest 6.4 cm).  PET scan on 10/04/2014 revealed 6.5 x  6.1 cm hypermetabolic mass in the proximal sigmoid colon,  There were  numerous large hypermetabolic hepatic metastases.  There were no additional sites of metastatic disease in the neck, chest or skeleton.  In addition, there was a 3.2 x 2.8 cm low-attenuation right thyroid nodule.   She is status post 14 cycles of FOLFOX chemotherapy (10/09/2014 - 04/11/2015).  She declined Avastin.  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  CEA was 10,169 on 10/09/2014 , 12,034 on 0712/2016, 7765 on 11/20/2014, 5154 on 12/04/2014, 1921 on 01/01/2015, 1266 on 01/15/2015, 770.6 on 01/29/2015, 502.7 on 02/12/2015, 368.7 on 02/26/2015, 269 on 03/14/2015, 178.3 on 03/28/2015, 135.9 on 04/11/2015, and 116.1 on 05/13/2015.  Abdominal and pelvic CT scan on 12/28/2014 revealed response to therapy.  Liver metastases and the descending/sigmoid mass were smaller.  There was no new disease.  Abdomen and pelvic CT scan on 03/25/2015 revealed interval decrease in size of hepatic metastasis and distal descending colon mass.   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 She is able to tolerate treatment very well. CEA continues to decline.  Symptomatically, she has a grade II-III neuropathy, she is able to tolerate Neurontin at 100 mg at night, without significant improvement off neuropathy.    Constipation has resolved. She will continue with senna, Colace as needed, as well as sorbitol as needed.  Plan: 1.  Labs today:  CBC with diff, CMP, Mg,date 2.  Cycle #3 5FU/LVtoday. 3.  RTC in 2 days for pump disconnect. 4.  increase Neurontin to 200 mg po qhs.   5.  RTC in 2 weeks for MD assess, labs (CBC with diff, CMP, CEA, Mg), and cycle #4 5FU/LV.   Roxana Hires, MD  05/27/2015, 9:17 AM

## 2015-05-27 NOTE — Telephone Encounter (Signed)
Called pt's daughter [name] back regarding referral for a company called [employer].  I spoke with daughter and told her I would talk with Dr. Mike Gip tomorrow because now is after hours, and If I didn't get back with her to please call back.

## 2015-05-29 ENCOUNTER — Inpatient Hospital Stay: Payer: Medicare Other

## 2015-05-29 DIAGNOSIS — C189 Malignant neoplasm of colon, unspecified: Secondary | ICD-10-CM

## 2015-05-29 DIAGNOSIS — G629 Polyneuropathy, unspecified: Secondary | ICD-10-CM

## 2015-05-29 DIAGNOSIS — C787 Secondary malignant neoplasm of liver and intrahepatic bile duct: Secondary | ICD-10-CM

## 2015-05-29 DIAGNOSIS — Z5111 Encounter for antineoplastic chemotherapy: Secondary | ICD-10-CM | POA: Diagnosis not present

## 2015-05-29 MED ORDER — SODIUM CHLORIDE 0.9 % IJ SOLN
10.0000 mL | INTRAMUSCULAR | Status: DC | PRN
Start: 2015-05-29 — End: 2015-05-29
  Administered 2015-05-29: 10 mL
  Filled 2015-05-29: qty 10

## 2015-05-29 MED ORDER — HEPARIN SOD (PORK) LOCK FLUSH 100 UNIT/ML IV SOLN
500.0000 [IU] | Freq: Once | INTRAVENOUS | Status: AC | PRN
  Administered 2015-05-29: 500 [IU]
  Filled 2015-05-29: qty 5

## 2015-06-10 ENCOUNTER — Inpatient Hospital Stay: Payer: Medicare Other

## 2015-06-10 ENCOUNTER — Other Ambulatory Visit: Payer: Self-pay | Admitting: Hematology and Oncology

## 2015-06-10 ENCOUNTER — Inpatient Hospital Stay (HOSPITAL_BASED_OUTPATIENT_CLINIC_OR_DEPARTMENT_OTHER): Payer: Medicare Other | Admitting: Hematology and Oncology

## 2015-06-10 ENCOUNTER — Other Ambulatory Visit: Payer: Self-pay

## 2015-06-10 ENCOUNTER — Encounter: Payer: Self-pay | Admitting: Gastroenterology

## 2015-06-10 ENCOUNTER — Ambulatory Visit (INDEPENDENT_AMBULATORY_CARE_PROVIDER_SITE_OTHER): Payer: Medicare Other | Admitting: Gastroenterology

## 2015-06-10 VITALS — BP 152/72 | HR 60 | Temp 98.2°F | Ht 65.0 in | Wt 144.0 lb

## 2015-06-10 VITALS — BP 137/80 | HR 61 | Temp 97.0°F | Resp 18 | Ht 65.0 in | Wt 145.5 lb

## 2015-06-10 DIAGNOSIS — C186 Malignant neoplasm of descending colon: Secondary | ICD-10-CM

## 2015-06-10 DIAGNOSIS — C787 Secondary malignant neoplasm of liver and intrahepatic bile duct: Secondary | ICD-10-CM | POA: Diagnosis not present

## 2015-06-10 DIAGNOSIS — K921 Melena: Secondary | ICD-10-CM

## 2015-06-10 DIAGNOSIS — I10 Essential (primary) hypertension: Secondary | ICD-10-CM

## 2015-06-10 DIAGNOSIS — K625 Hemorrhage of anus and rectum: Secondary | ICD-10-CM

## 2015-06-10 DIAGNOSIS — C189 Malignant neoplasm of colon, unspecified: Secondary | ICD-10-CM

## 2015-06-10 DIAGNOSIS — Z79899 Other long term (current) drug therapy: Secondary | ICD-10-CM

## 2015-06-10 DIAGNOSIS — E041 Nontoxic single thyroid nodule: Secondary | ICD-10-CM

## 2015-06-10 DIAGNOSIS — G629 Polyneuropathy, unspecified: Secondary | ICD-10-CM

## 2015-06-10 DIAGNOSIS — Z5111 Encounter for antineoplastic chemotherapy: Secondary | ICD-10-CM | POA: Diagnosis not present

## 2015-06-10 DIAGNOSIS — E119 Type 2 diabetes mellitus without complications: Secondary | ICD-10-CM

## 2015-06-10 DIAGNOSIS — Z87891 Personal history of nicotine dependence: Secondary | ICD-10-CM

## 2015-06-10 DIAGNOSIS — K5900 Constipation, unspecified: Secondary | ICD-10-CM

## 2015-06-10 DIAGNOSIS — Z7984 Long term (current) use of oral hypoglycemic drugs: Secondary | ICD-10-CM

## 2015-06-10 DIAGNOSIS — M129 Arthropathy, unspecified: Secondary | ICD-10-CM

## 2015-06-10 DIAGNOSIS — Z7982 Long term (current) use of aspirin: Secondary | ICD-10-CM

## 2015-06-10 DIAGNOSIS — G4700 Insomnia, unspecified: Secondary | ICD-10-CM

## 2015-06-10 LAB — CBC WITH DIFFERENTIAL/PLATELET
BASOS ABS: 0 10*3/uL (ref 0–0.1)
BASOS PCT: 0 %
Eosinophils Absolute: 0.1 10*3/uL (ref 0–0.7)
Eosinophils Relative: 1 %
HEMATOCRIT: 32.3 % — AB (ref 35.0–47.0)
Hemoglobin: 10.8 g/dL — ABNORMAL LOW (ref 12.0–16.0)
LYMPHS PCT: 14 %
Lymphs Abs: 1.2 10*3/uL (ref 1.0–3.6)
MCH: 31.1 pg (ref 26.0–34.0)
MCHC: 33.5 g/dL (ref 32.0–36.0)
MCV: 92.8 fL (ref 80.0–100.0)
MONO ABS: 0.6 10*3/uL (ref 0.2–0.9)
Monocytes Relative: 6 %
NEUTROS ABS: 6.8 10*3/uL — AB (ref 1.4–6.5)
Neutrophils Relative %: 79 %
PLATELETS: 186 10*3/uL (ref 150–440)
RBC: 3.48 MIL/uL — AB (ref 3.80–5.20)
RDW: 17.8 % — AB (ref 11.5–14.5)
WBC: 8.7 10*3/uL (ref 3.6–11.0)

## 2015-06-10 LAB — COMPREHENSIVE METABOLIC PANEL
ALBUMIN: 3.4 g/dL — AB (ref 3.5–5.0)
ALT: 12 U/L — AB (ref 14–54)
AST: 16 U/L (ref 15–41)
Alkaline Phosphatase: 111 U/L (ref 38–126)
Anion gap: 7 (ref 5–15)
BILIRUBIN TOTAL: 0.7 mg/dL (ref 0.3–1.2)
BUN: 12 mg/dL (ref 6–20)
CHLORIDE: 108 mmol/L (ref 101–111)
CO2: 23 mmol/L (ref 22–32)
CREATININE: 0.85 mg/dL (ref 0.44–1.00)
Calcium: 10 mg/dL (ref 8.9–10.3)
GFR calc Af Amer: >60 mL/min (ref >60)
GFR calc non Af Amer: >60 mL/min (ref >60)
GLUCOSE: 188 mg/dL — AB (ref 65–99)
POTASSIUM: 3.9 mmol/L (ref 3.5–5.1)
Sodium: 138 mmol/L (ref 135–145)
Total Protein: 6.7 g/dL (ref 6.5–8.1)

## 2015-06-10 LAB — MAGNESIUM: Magnesium: 1.8 mg/dL (ref 1.7–2.4)

## 2015-06-10 MED ORDER — OXYCODONE-ACETAMINOPHEN 5-325 MG PO TABS: 1.0000 | ORAL_TABLET | Freq: Four times a day (QID) | ORAL | Status: DC | PRN

## 2015-06-10 NOTE — Progress Notes (Signed)
Primary Care Physician: [name], MD  Primary Gastroenterologist:  Dr. Lucilla Lame  Chief Complaint  Patient presents with  . Hepatic Cancer    HPI: Sandy [name] is a 70 y.o. female here after being seen this morning by her oncologist. The patient was sent here today because she had an episode of bright red blood per rectum. The patient's daughter did not see the stool nor is this sheany clots in it. There is no report of any increase in her abdominal pain. She also has not had any further episodes of rectal bleeding since this morning. She denies any nausea or vomiting. The patient is presently getting chemotherapy.  Current Outpatient Prescriptions  Medication Sig Dispense Refill  . amLODipine (NORVASC) 5 MG tablet Take 5 mg by mouth daily.    Marland Kitchen aspirin EC 81 MG tablet Take 81 mg by mouth daily.    Marland Kitchen atorvastatin (LIPITOR) 40 MG tablet Take 40 mg by mouth daily.    Marland Kitchen gabapentin (NEURONTIN) 100 MG capsule Take 1 capsule (100 mg total) by mouth at bedtime. 60 capsule 0  . lidocaine-prilocaine (EMLA) cream Apply 1 application topically as needed. 30 g 1  . lisinopril-hydrochlorothiazide (PRINZIDE,ZESTORETIC) 20-25 MG per tablet Take 1 tablet by mouth daily.    . megestrol (MEGACE) 400 MG/10ML suspension Take 5 mLs (200 mg total) by mouth daily. to stimulate appetite 240 mL 0  . metFORMIN (GLUCOPHAGE) 1000 MG tablet Take 1,000 mg by mouth 2 (two) times daily.     . metoprolol succinate (TOPROL-XL) 25 MG 24 hr tablet Take 25 mg by mouth daily.    . Multiple Vitamin (MULTIVITAMIN WITH MINERALS) TABS tablet Take 1 tablet by mouth daily.    . ondansetron (ZOFRAN) 4 MG tablet Take 1 tablet (4 mg total) by mouth every 8 (eight) hours as needed for nausea or vomiting. 30 tablet 2  . oxyCODONE-acetaminophen (PERCOCET/ROXICET) 5-325 MG tablet Take 1 tablet by mouth every 6 (six) hours as needed for severe pain. 30 tablet 0  . potassium chloride (K-DUR,KLOR-CON) 10 MEQ tablet Take 1  tablet (10 mEq total) by mouth once. Take 1 pill twice a day for 3 days then 1 pill a day. 30 tablet 0  . Sennosides-Docusate Sodium (STOOL SOFTENER LAXATIVE PO) Take 1 tablet by mouth 2 (two) times daily.     Marland Kitchen zolpidem (AMBIEN) 10 MG tablet Take 1 tablet by mouth at bedtime as needed.     No current facility-administered medications for this visit.   Facility-Administered Medications Ordered in Other Visits  Medication Dose Route Frequency Provider Last Rate Last Dose  . acetaminophen (TYLENOL) tablet 650 mg  650 mg Oral Once Lequita Asal, MD   650 mg at 10/19/14 [id number]  . sodium chloride 0.9 % injection 10 mL  10 mL Intracatheter PRN Lequita Asal, MD   10 mL at 10/11/14 1431  . sodium chloride 0.9 % injection 10 mL  10 mL Intracatheter PRN Lequita Asal, MD   10 mL at 02/28/15 1529    Allergies as of 06/10/2015  . (No Known Allergies)    ROS:  General: Negative for anorexia, weight loss, fever, chills, fatigue, weakness. ENT: Negative for hoarseness, difficulty swallowing , nasal congestion. CV: Negative for chest pain, angina, palpitations, dyspnea on exertion, peripheral edema.  Respiratory: Negative for dyspnea at rest, dyspnea on exertion, cough, sputum, wheezing.  GI: See history of present illness. GU:  Negative for dysuria, hematuria, urinary incontinence, urinary frequency, nocturnal  urination.  Endo: Negative for unusual weight change.    Physical Examination:   BP 152/72 mmHg  Pulse 60  Temp(Src) 98.2 F (36.8 C) (Oral)  Ht 5\' 5"  (1.651 m)  Wt 144 lb (65.318 kg)  BMI 23.96 kg/m2  General: Well-nourished, well-developed in no acute distress.  Eyes: No icterus. Conjunctivae pink. Mouth: Oropharyngeal mucosa moist and pink , no lesions erythema or exudate. Lungs: Clear to auscultation bilaterally. Non-labored. Heart: Regular rate and rhythm, no murmurs rubs or gallops.  Abdomen: Bowel sounds are normal, nontender, nondistended, no hepatosplenomegaly  or masses, no abdominal bruits or hernia , no rebound or guarding.   Extremities: No lower extremity edema. No clubbing or deformities. Neuro: Alert and oriented x 3.  Grossly intact. Skin: Warm and dry, no jaundice.   Psych: Alert and cooperative, normal mood and affect.  Labs:    Imaging Studies: No results found.  Assessment and Plan:   [name] is a 70 y.o. y/o female who comes in today with a episode of rectal bleeding. The patient has been constipated and her last CT scan and back in December showed her to have a partially obstructing lesion that was seen on her colonoscopy prior to that. The patient have bleeding from the mass versus hemorrhoids. She denies being constipated recently although she has had constipation the past. The patient will have her blood sent off tomorrow morning for repeat hemoglobin and if it is lower the patient may need intervention with possible admission and transfusion versus outpatient colonoscopy. The patient and her family have been explained the plan and agree with it.   Note: This dictation was prepared with Dragon dictation along with smaller phrase technology. Any transcriptional errors that result from this process are unintentional.

## 2015-06-10 NOTE — Progress Notes (Signed)
[facility] day:  06/10/2015  Chief Complaint: Sandy [name] is a 70 y.o. female with stage IV colon cancer who is seen for assessment prior to cycle #4 5-fluorouracil and leucovorin chemotherapy.  HPI: The patient was last seen by me in the medical oncology clinic on 05/13/2015.  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  She received 5-FU and leucovorin alone. She began Neurontin 100 mg po q hs with planned dose increases every 3-5 days.  She saw Dr Rudean Hitt on 05/27/2015.    At that time, she received cycle #3 5-FU and leucovorin.  Her Neurontin was increased to 200 mg po qhs.  During the interim, she continues to have a neuropathy which causes her to have trouble holding onto things.  It takes her awhile to button buttons and zip zippers.  She notes a little bit of neuropathy in her feet.  Neurontin makes her a little sleepy in the evenings.  She describes passing a clot of blood this morning.  She describes "passing wind then a plop".  Blood was on her clothes and toilet bowl. She is unsure of the amount.  She didn't let her daughter see it.   She does not think she had a bowel movement.  She denies constipation or straining.  Her daughter notes that she ate a lot of jumbo shrimp recently.  Past Medical History  Diagnosis Date  . Hypertension   . Arthritis   . Constipation   . Insomnia   . Diabetes mellitus without complication ([place])   . Cancer [facility])     liver mets; colon cancer    Past Surgical History  Procedure Laterality Date  . Colonoscopy with propofol N/A 09/07/2014    Procedure: COLONOSCOPY WITH PROPOFOL;  Surgeon: Lucilla Lame, MD;  Location: [facility] ENDOSCOPY;  Service: Endoscopy;  Laterality: N/A;  . Esophagogastroduodenoscopy N/A 09/07/2014    Procedure: ESOPHAGOGASTRODUODENOSCOPY (EGD);  Surgeon: Lucilla Lame, MD;  Location: [facility] ENDOSCOPY;  Service: Endoscopy;  Laterality: N/A;  . Portacath  placement Left 09/24/2014    Procedure: INSERTION PORT-A-CATH;  Surgeon: Robert Bellow, MD;  Location: [facility] ORS;  Service: General;  Laterality: Left;  . Liver biopsy      Family History  Problem Relation Age of Onset  . Cancer Sister     Social History:  reports that she quit smoking about 12 years ago. Her smoking use included Cigarettes. She has a 5 pack-year smoking history. She has never used smokeless tobacco. She reports that she does not drink alcohol or use illicit drugs.  The patient is accompanied by her daughter.   Allergies: No Known Allergies  Current Medications: Current Outpatient Prescriptions  Medication Sig Dispense Refill  . amLODipine (NORVASC) 5 MG tablet Take 5 mg by mouth daily.    Marland Kitchen aspirin EC 81 MG tablet Take 81 mg by mouth daily.    Marland Kitchen atorvastatin (LIPITOR) 40 MG tablet Take 40 mg by mouth daily.    Marland Kitchen gabapentin (NEURONTIN) 100 MG capsule Take 1 capsule (100 mg total) by mouth at bedtime. 60 capsule 0  . lidocaine-prilocaine (EMLA) cream Apply 1 application topically as needed. 30 g 1  . lisinopril-hydrochlorothiazide (PRINZIDE,ZESTORETIC) 20-25 MG per tablet Take 1 tablet by mouth daily.    . megestrol (MEGACE) 400 MG/10ML suspension Take 5 mLs (200 mg total) by mouth daily. to stimulate appetite 240 mL 0  . metFORMIN (GLUCOPHAGE) 1000 MG tablet Take 1,000 mg  by mouth 2 (two) times daily.     . metoprolol succinate (TOPROL-XL) 25 MG 24 hr tablet Take 25 mg by mouth daily.    . Multiple Vitamin (MULTIVITAMIN WITH MINERALS) TABS tablet Take 1 tablet by mouth daily.    . ondansetron (ZOFRAN) 4 MG tablet Take 1 tablet (4 mg total) by mouth every 8 (eight) hours as needed for nausea or vomiting. 30 tablet 2  . oxyCODONE-acetaminophen (PERCOCET/ROXICET) 5-325 MG tablet Take 1 tablet by mouth every 6 (six) hours as needed for severe pain. 30 tablet 0  . potassium chloride (K-DUR,KLOR-CON) 10 MEQ tablet Take 1 tablet (10 mEq total) by mouth once. Take 1 pill twice  a day for 3 days then 1 pill a day. 30 tablet 0  . Sennosides-Docusate Sodium (STOOL SOFTENER LAXATIVE PO) Take 1 tablet by mouth 2 (two) times daily.     Marland Kitchen zolpidem (AMBIEN) 10 MG tablet Take 1 tablet by mouth at bedtime as needed.     No current facility-administered medications for this visit.   Facility-Administered Medications Ordered in Other Visits  Medication Dose Route Frequency Provider Last Rate Last Dose  . acetaminophen (TYLENOL) tablet 650 mg  650 mg Oral Once Lequita Asal, MD   650 mg at 10/19/14 2725  . sodium chloride 0.9 % injection 10 mL  10 mL Intracatheter PRN Lequita Asal, MD   10 mL at 10/11/14 1431  . sodium chloride 0.9 % injection 10 mL  10 mL Intracatheter PRN Lequita Asal, MD   10 mL at 02/28/15 1529    Review of Systems:  GENERAL: Feels "ok".   Daughter notes activity as "couch potato".  No fevers or sweats.  Weight stable. PERFORMANCE STATUS (ECOG): 2. HEENT: Wears a hearing aide. No visual changes, runny nose, sore throat, mouth sores or tenderness. Lungs: No shortness of breath or cough. No hemoptysis. Cardiac: No chest pain, palpitations, orthopnea, or PND. GI:  Large amount of blood per rectum this morning.  No nausea, vomiting, diarrhea, constipation, melena or hematochezia. GU: Wears a Depends pad.  No dysuria or hematuria. Musculoskeletal: Denies back pain. No joint pain. No muscle tenderness. Extremities: No pain or swelling. Skin: No rashes, skin lesions, or ulcers Neuro:  Neuropathy in fingers (stable).  No headache, numbness or weakness, or balance issues. Endocrine: Diabetes.  No thyroid issues, hot flashes or night sweats. Psych: No mood changes, depression or anxiety. Pain: No pain. Review of systems: All other systems reviewed and found to be negative.  Physical Exam: Blood pressure 137/80, pulse 61, temperature 97 F (36.1 C), temperature source Tympanic, resp. rate 18, height 5' 5" (1.651 m), weight 145 lb  8.1 oz (66 kg). GENERAL: Elderly woman sitting comfortably in a wheelchair in the exam room in no acute distress. MENTAL STATUS: Alert and oriented to person, place and time. HEAD: Styled curly brown hair. Normocephalic, atraumatic, face symmetric, no Cushingoid features. EYES: Black rimmed glasses. Hazel/green eyes. Pupils equal round and reactive to light and accomodation. No conjunctivitis or scleral icterus. ENT: Oropharynx clear without lesion. Partial. Tongue normal. Mucous membranes moist.  RESPIRATORY: Clear to auscultation without rales, wheezes or rhonchi. CARDIOVASCULAR: Regular rate and rhythm without murmur, rub or gallop. ABDOMEN: Soft, non-tender with active bowel sounds. No guarding or rebound tenderness. No hepatosplenomegaly. RECTAL:  No external hemorrhoids.  Large amount of blood on gloved finger. SKIN: No rashes, ulcer or skin lesions.  EXTREMITIES: No edema, no skin discoloration or tenderness. No palpable cords. LYMPH NODES:  No palpable cervical, supraclavicular, axillary or inguinal adenopathy  NEUROLOGICAL: Unremarkable.  Gait slightly unsteady. PSYCH: Appropriate.  Appointment on 06/10/2015  Component Date Value Ref Range Status  . WBC 06/10/2015 8.7  3.6 - 11.0 K/uL Final  . RBC 06/10/2015 3.48* 3.80 - 5.20 MIL/uL Final  . Hemoglobin 06/10/2015 10.8* 12.0 - 16.0 g/dL Final  . HCT 06/10/2015 32.3* 35.0 - 47.0 % Final  . MCV 06/10/2015 92.8  80.0 - 100.0 fL Final  . MCH 06/10/2015 31.1  26.0 - 34.0 pg Final  . MCHC 06/10/2015 33.5  32.0 - 36.0 g/dL Final  . RDW 06/10/2015 17.8* 11.5 - 14.5 % Final  . Platelets 06/10/2015 186  150 - 440 K/uL Final  . Neutrophils Relative % 06/10/2015 79   Final  . Neutro Abs 06/10/2015 6.8* 1.4 - 6.5 K/uL Final  . Lymphocytes Relative 06/10/2015 14   Final  . Lymphs Abs 06/10/2015 1.2  1.0 - 3.6 K/uL Final  . Monocytes Relative 06/10/2015 6   Final  . Monocytes Absolute 06/10/2015 0.6  0.2 - 0.9 K/uL Final  .  Eosinophils Relative 06/10/2015 1   Final  . Eosinophils Absolute 06/10/2015 0.1  0 - 0.7 K/uL Final  . Basophils Relative 06/10/2015 0   Final  . Basophils Absolute 06/10/2015 0.0  0 - 0.1 K/uL Final  . Sodium 06/10/2015 138  135 - 145 mmol/L Final  . Potassium 06/10/2015 3.9  3.5 - 5.1 mmol/L Final  . Chloride 06/10/2015 108  101 - 111 mmol/L Final  . CO2 06/10/2015 23  22 - 32 mmol/L Final  . Glucose, Bld 06/10/2015 188* 65 - 99 mg/dL Final  . BUN 06/10/2015 12  6 - 20 mg/dL Final  . Creatinine, Ser 06/10/2015 0.85  0.44 - 1.00 mg/dL Final  . Calcium 06/10/2015 10.0  8.9 - 10.3 mg/dL Final  . Total Protein 06/10/2015 6.7  6.5 - 8.1 g/dL Final  . Albumin 06/10/2015 3.4* 3.5 - 5.0 g/dL Final  . AST 06/10/2015 16  15 - 41 U/L Final  . ALT 06/10/2015 12* 14 - 54 U/L Final  . Alkaline Phosphatase 06/10/2015 111  38 - 126 U/L Final  . Total Bilirubin 06/10/2015 0.7  0.3 - 1.2 mg/dL Final  . GFR calc non Af Amer 06/10/2015 >60  >60 mL/min Final  . GFR calc Af Amer 06/10/2015 >60  >60 mL/min Final   Comment: (NOTE) The eGFR has been calculated using the CKD EPI equation. This calculation has not been validated in all clinical situations. eGFR's persistently <60 mL/min signify possible Chronic Kidney Disease.   . Anion gap 06/10/2015 7  5 - 15 Final  . Magnesium 06/10/2015 1.8  1.7 - 2.4 mg/dL Final    Assessment:  Sandy [name] is a 70 y.o. female with metastatic colon cancer. She presented with a 53 pound weight loss in 6 months. Colonoscopy on 09/07/2014 revealed a circumferential partially obstructive fungating mass in the descending colon. There was no active bleeding. There was a 1.5 cm pedunculated polyp in the sigmoid colon (tubulovillous adenoma). Pathology from the descending colon mass revealed fragments of adenocarcinoma. There was not enough tissue for KRAS testing.  CEA was 11,793 on 09/05/2014.  Chest CT angiogram on 08/28/2014 revealed no evidence of pulmonary  embolism, but multiple large enhancing liver masses (largest 6.4 cm).  PET scan on 10/04/2014 revealed 6.5 x 6.1 cm hypermetabolic mass in the proximal sigmoid colon,  There were  numerous large hypermetabolic hepatic metastases. There were  no additional sites of metastatic disease in the neck, chest or skeleton.  In addition, there was a 3.2 x 2.8 cm low-attenuation right thyroid nodule.   She is status post 14 cycles of FOLFOX chemotherapy (10/09/2014 - 04/11/2015).  She declined Avastin.  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  CEA was 10,169 on 10/09/2014 , 12,034 on 0712/2016, 7765 on 11/20/2014, 5154 on 12/04/2014, 1921 on 01/01/2015, 1266 on 01/15/2015, 770.6 on 01/29/2015, 502.7 on 02/12/2015, 368.7 on 02/26/2015, 269 on 03/14/2015, 178.3 on 03/28/2015, 135.9 on 04/11/2015, and 116.1 on 05/13/2015.  Abdominal and pelvic CT scan on 12/28/2014 revealed response to therapy.  Liver metastases and the descending/sigmoid mass were smaller.  There was no new disease.  Abdomen and pelvic CT scan on 03/25/2015 revealed interval decrease in size of hepatic metastasis and distal descending colon mass.   She is status post 3 cycles of 5-fluorouracil and leucovorin (04/28/2014 - 05/27/2015) secondary to oxaliplatin neuropathy.  She is on Neurontin 200 mg po qhs.  Symptomatically, she has a grade III neuropathy.  She passed a significant amount of bright red blood per rectum today.    Plan: 1.  Labs today:  CBC with diff, CMP, CEA, Mg. 2.  Postpone cycle #4 5-FU/LV today. 3.  Contact Dr. Lucilla Lame, patient's gastroenterologist- done.  She will be seen today at 1 pm. 4.  Increase Neurontin 100 mg in AM and 200 mg at night.  Increase dose as tolerated. 5.  RTC on 06/24/2015 (per patient request) for MD assess, labs (CBC with diff, CMP, Mg), and cycle #4 5FU/LV.   Lequita Asal, MD  06/10/2015, 9:52 AM

## 2015-06-11 ENCOUNTER — Encounter: Payer: Self-pay | Admitting: Hematology and Oncology

## 2015-06-11 LAB — CEA: CEA: 90.8 ng/mL — AB (ref 0.0–4.7)

## 2015-06-12 ENCOUNTER — Inpatient Hospital Stay: Payer: Medicare Other

## 2015-06-12 ENCOUNTER — Telehealth: Payer: Self-pay | Admitting: *Deleted

## 2015-06-12 ENCOUNTER — Other Ambulatory Visit: Payer: Self-pay | Admitting: *Deleted

## 2015-06-12 LAB — CBC WITH DIFFERENTIAL/PLATELET
BASOS ABS: 0 10*3/uL (ref 0.0–0.2)
Basos: 0 %
EOS (ABSOLUTE): 0.1 10*3/uL (ref 0.0–0.4)
Eos: 2 %
HEMOGLOBIN: 10.6 g/dL — AB (ref 11.1–15.9)
Hematocrit: 32 % — ABNORMAL LOW (ref 34.0–46.6)
Immature Grans (Abs): 0 10*3/uL (ref 0.0–0.1)
Immature Granulocytes: 0 %
LYMPHS ABS: 1.4 10*3/uL (ref 0.7–3.1)
Lymphs: 18 %
MCH: 31 pg (ref 26.6–33.0)
MCHC: 33.1 g/dL (ref 31.5–35.7)
MCV: 94 fL (ref 79–97)
MONOCYTES: 7 %
MONOS ABS: 0.5 10*3/uL (ref 0.1–0.9)
Neutrophils Absolute: 5.6 10*3/uL (ref 1.4–7.0)
Neutrophils: 73 %
PLATELETS: 192 10*3/uL (ref 150–379)
RBC: 3.42 x10E6/uL — AB (ref 3.77–5.28)
RDW: 17.2 % — AB (ref 12.3–15.4)
WBC: 7.6 10*3/uL (ref 3.4–10.8)

## 2015-06-12 NOTE — Telephone Encounter (Signed)
  Please call patient.    If Dr. Allen Norris clears patient, she can see me and have treatment that day.  M

## 2015-06-12 NOTE — Telephone Encounter (Signed)
Informed Sandy [name] of D [facility] response and she states that they called her this morning and said she is ok, I explained that they need to let Dr Mike Gip know this, so she said she will call Ginger and have her call Dr Mike Gip

## 2015-06-12 NOTE — Telephone Encounter (Signed)
Asking if chemo appt can be moved to 3/6, she has arranged transportation for this date. Please call her and let her know

## 2015-06-13 MED ORDER — GABAPENTIN 100 MG PO CAPS: 200.0000 mg | ORAL_CAPSULE | Freq: Every day | ORAL | Status: DC

## 2015-06-23 ENCOUNTER — Other Ambulatory Visit: Payer: Self-pay | Admitting: Hematology and Oncology

## 2015-06-24 ENCOUNTER — Inpatient Hospital Stay: Payer: Medicare Other

## 2015-06-24 ENCOUNTER — Inpatient Hospital Stay: Payer: Medicare Other | Attending: Hematology and Oncology | Admitting: Hematology and Oncology

## 2015-06-24 VITALS — BP 134/79 | HR 60 | Temp 96.2°F | Resp 18 | Ht 65.0 in | Wt 145.1 lb

## 2015-06-24 DIAGNOSIS — K625 Hemorrhage of anus and rectum: Secondary | ICD-10-CM | POA: Diagnosis not present

## 2015-06-24 DIAGNOSIS — E119 Type 2 diabetes mellitus without complications: Secondary | ICD-10-CM | POA: Diagnosis not present

## 2015-06-24 DIAGNOSIS — G4730 Sleep apnea, unspecified: Secondary | ICD-10-CM | POA: Diagnosis not present

## 2015-06-24 DIAGNOSIS — Z7982 Long term (current) use of aspirin: Secondary | ICD-10-CM

## 2015-06-24 DIAGNOSIS — C787 Secondary malignant neoplasm of liver and intrahepatic bile duct: Principal | ICD-10-CM

## 2015-06-24 DIAGNOSIS — Z79899 Other long term (current) drug therapy: Secondary | ICD-10-CM | POA: Diagnosis not present

## 2015-06-24 DIAGNOSIS — R197 Diarrhea, unspecified: Secondary | ICD-10-CM | POA: Insufficient documentation

## 2015-06-24 DIAGNOSIS — C189 Malignant neoplasm of colon, unspecified: Secondary | ICD-10-CM

## 2015-06-24 DIAGNOSIS — Z87891 Personal history of nicotine dependence: Secondary | ICD-10-CM | POA: Insufficient documentation

## 2015-06-24 DIAGNOSIS — K5900 Constipation, unspecified: Secondary | ICD-10-CM | POA: Diagnosis not present

## 2015-06-24 DIAGNOSIS — Z5111 Encounter for antineoplastic chemotherapy: Secondary | ICD-10-CM | POA: Diagnosis not present

## 2015-06-24 DIAGNOSIS — F17210 Nicotine dependence, cigarettes, uncomplicated: Secondary | ICD-10-CM

## 2015-06-24 DIAGNOSIS — R634 Abnormal weight loss: Secondary | ICD-10-CM

## 2015-06-24 DIAGNOSIS — I10 Essential (primary) hypertension: Secondary | ICD-10-CM | POA: Insufficient documentation

## 2015-06-24 DIAGNOSIS — G629 Polyneuropathy, unspecified: Secondary | ICD-10-CM | POA: Diagnosis not present

## 2015-06-24 DIAGNOSIS — Z7984 Long term (current) use of oral hypoglycemic drugs: Secondary | ICD-10-CM | POA: Insufficient documentation

## 2015-06-24 DIAGNOSIS — M129 Arthropathy, unspecified: Secondary | ICD-10-CM | POA: Diagnosis not present

## 2015-06-24 LAB — CBC WITH DIFFERENTIAL/PLATELET
Basophils Absolute: 0.1 10*3/uL (ref 0–0.1)
Basophils Relative: 1 %
Eosinophils Absolute: 0.3 10*3/uL (ref 0–0.7)
Eosinophils Relative: 4 %
HCT: 32.7 % — ABNORMAL LOW (ref 35.0–47.0)
Hemoglobin: 11 g/dL — ABNORMAL LOW (ref 12.0–16.0)
Lymphocytes Relative: 14 %
Lymphs Abs: 1.2 10*3/uL (ref 1.0–3.6)
MCH: 31.2 pg (ref 26.0–34.0)
MCHC: 33.5 g/dL (ref 32.0–36.0)
MCV: 93 fL (ref 80.0–100.0)
Monocytes Absolute: 0.6 10*3/uL (ref 0.2–0.9)
Monocytes Relative: 7 %
Neutro Abs: 6.8 10*3/uL — ABNORMAL HIGH (ref 1.4–6.5)
Neutrophils Relative %: 74 %
Platelets: 255 10*3/uL (ref 150–440)
RBC: 3.52 MIL/uL — ABNORMAL LOW (ref 3.80–5.20)
RDW: 17 % — ABNORMAL HIGH (ref 11.5–14.5)
WBC: 9 10*3/uL (ref 3.6–11.0)

## 2015-06-24 LAB — COMPREHENSIVE METABOLIC PANEL
ALT: 11 U/L — ABNORMAL LOW (ref 14–54)
AST: 16 U/L (ref 15–41)
Albumin: 3.4 g/dL — ABNORMAL LOW (ref 3.5–5.0)
Alkaline Phosphatase: 108 U/L (ref 38–126)
Anion gap: 7 (ref 5–15)
BUN: 12 mg/dL (ref 6–20)
CO2: 23 mmol/L (ref 22–32)
Calcium: 9.8 mg/dL (ref 8.9–10.3)
Chloride: 108 mmol/L (ref 101–111)
Creatinine, Ser: 0.79 mg/dL (ref 0.44–1.00)
GFR calc Af Amer: >60 mL/min (ref >60)
GFR calc non Af Amer: >60 mL/min (ref >60)
Glucose, Bld: 169 mg/dL — ABNORMAL HIGH (ref 65–99)
Potassium: 3.8 mmol/L (ref 3.5–5.1)
Sodium: 138 mmol/L (ref 135–145)
Total Bilirubin: 0.8 mg/dL (ref 0.3–1.2)
Total Protein: 7.1 g/dL (ref 6.5–8.1)

## 2015-06-24 LAB — MAGNESIUM: Magnesium: 1.9 mg/dL (ref 1.7–2.4)

## 2015-06-24 MED ORDER — HEPARIN SOD (PORK) LOCK FLUSH 100 UNIT/ML IV SOLN: 500.0000 [IU] | Freq: Once | INTRAVENOUS | Status: DC

## 2015-06-24 MED ORDER — FLUOROURACIL CHEMO INJECTION 2.5 GM/50ML
400.0000 mg/m2 | Freq: Once | INTRAVENOUS | Status: AC
Start: 2015-06-24 — End: 2015-06-24
  Administered 2015-06-24: 650 mg via INTRAVENOUS
  Filled 2015-06-24: qty 13

## 2015-06-24 MED ORDER — SODIUM CHLORIDE 0.9% FLUSH
10.0000 mL | INTRAVENOUS | Status: DC | PRN
  Administered 2015-06-24: 10 mL via INTRAVENOUS
  Filled 2015-06-24: qty 10

## 2015-06-24 MED ORDER — SODIUM CHLORIDE 0.9 % IV SOLN
2400.0000 mg/m2 | INTRAVENOUS | Status: DC
  Administered 2015-06-24: 3900 mg via INTRAVENOUS
  Filled 2015-06-24: qty 78

## 2015-06-24 MED ORDER — SODIUM CHLORIDE 0.9 % IV SOLN
Freq: Once | INTRAVENOUS | Status: AC
  Administered 2015-06-24: 11:00:00 via INTRAVENOUS
  Filled 2015-06-24: qty 4

## 2015-06-24 MED ORDER — LEUCOVORIN CALCIUM INJECTION 350 MG: 650.0000 mg | Freq: Once | INTRAVENOUS | Status: DC

## 2015-06-24 MED ORDER — LEUCOVORIN CALCIUM INJECTION 100 MG
20.0000 mg/m2 | Freq: Once | INTRAMUSCULAR | Status: AC
  Administered 2015-06-24: 32 mg via INTRAVENOUS
  Filled 2015-06-24: qty 1.6

## 2015-06-24 MED ORDER — DEXTROSE 5 % IV SOLN
Freq: Once | INTRAVENOUS | Status: AC
Start: 2015-06-24 — End: 2015-06-24
  Administered 2015-06-24: 10:00:00 via INTRAVENOUS
  Filled 2015-06-24: qty 1000

## 2015-06-24 MED ORDER — LEUCOVORIN CALCIUM INJECTION 100 MG: 20.0000 mg/m2 | Freq: Once | INTRAMUSCULAR | Status: DC

## 2015-06-24 NOTE — Progress Notes (Signed)
[place] [facility] day:  06/24/2015  Chief Complaint: Sandy [name] is a 70 y.o. female with stage IV colon cancer who is seen for assessment prior to cycle #4 5-fluorouracil and leucovorin chemotherapy.  HPI: The patient was last seen by me in the medical oncology clinic on 06/10/2015.  At that time, she was seen for assessment prior to cycle #4 5-FU and LV.  Treatment was held secondary to rectal bleeding.  She was referred to Dr. Allen Norris.  Her Neurontin was increased.  She was seen by Dr. Allen Norris.  Her hematocrit was stable.  Decision was made not to proceed with colonoscopy.  Discussions were held regarding some degree of obstruction caused by the mass.  Per patient, there was also discussion about possible surgery.  She is using Senna.  Bowel movements are 3 times a week.  She has had no further blood in her stool.  She denies straining.  Regarding her neuropathy, she states that it is "bad".  She is taking Neurontin 200 mg at night and 100 mg during the day.  The evening dose makes her sleepy.  Past Medical History  Diagnosis Date  . Hypertension   . Arthritis   . Constipation   . Insomnia   . Diabetes mellitus without complication ([place])   . Cancer [facility])     liver mets; colon cancer    Past Surgical History  Procedure Laterality Date  . Colonoscopy with propofol N/A 09/07/2014    Procedure: COLONOSCOPY WITH PROPOFOL;  Surgeon: Lucilla Lame, MD;  Location: [facility] ENDOSCOPY;  Service: Endoscopy;  Laterality: N/A;  . Esophagogastroduodenoscopy N/A 09/07/2014    Procedure: ESOPHAGOGASTRODUODENOSCOPY (EGD);  Surgeon: Lucilla Lame, MD;  Location: [facility] ENDOSCOPY;  Service: Endoscopy;  Laterality: N/A;  . Portacath placement Left 09/24/2014    Procedure: INSERTION PORT-A-CATH;  Surgeon: Robert Bellow, MD;  Location: [facility] ORS;  Service: General;  Laterality: Left;  . Liver biopsy      Family History  Problem Relation Age of Onset  . Cancer Sister      Social History:  reports that she quit smoking about 12 years ago. Her smoking use included Cigarettes. She has a 5 pack-year smoking history. She has never used smokeless tobacco. She reports that she does not drink alcohol or use illicit drugs.  The patient is accompanied by her daughter and grandson.   Allergies: No Known Allergies  Current Medications: Current Outpatient Prescriptions  Medication Sig Dispense Refill  . amLODipine (NORVASC) 5 MG tablet Take 5 mg by mouth daily.    Marland Kitchen aspirin EC 81 MG tablet Take 81 mg by mouth daily.    Marland Kitchen atorvastatin (LIPITOR) 40 MG tablet Take 40 mg by mouth daily.    Marland Kitchen gabapentin (NEURONTIN) 100 MG capsule Take 2 capsules (200 mg total) by mouth at bedtime. (Patient taking differently: Take 200 mg by mouth at bedtime. 1 tab daily and 2 tabs at bedtime.) 60 capsule 0  . lidocaine-prilocaine (EMLA) cream Apply 1 application topically as needed. 30 g 1  . lisinopril-hydrochlorothiazide (PRINZIDE,ZESTORETIC) 20-25 MG per tablet Take 1 tablet by mouth daily.    . megestrol (MEGACE) 400 MG/10ML suspension Take 5 mLs (200 mg total) by mouth daily. to stimulate appetite 240 mL 0  . metFORMIN (GLUCOPHAGE) 1000 MG tablet Take 1,000 mg by mouth 2 (two) times daily.     . metoprolol succinate (TOPROL-XL) 25 MG 24 hr tablet Take 25 mg by mouth daily.    Marland Kitchen  Multiple Vitamin (MULTIVITAMIN WITH MINERALS) TABS tablet Take 1 tablet by mouth daily.    . ondansetron (ZOFRAN) 4 MG tablet Take 1 tablet (4 mg total) by mouth every 8 (eight) hours as needed for nausea or vomiting. 30 tablet 2  . oxyCODONE-acetaminophen (PERCOCET/ROXICET) 5-325 MG tablet Take 1 tablet by mouth every 6 (six) hours as needed for severe pain. 30 tablet 0  . potassium chloride (K-DUR,KLOR-CON) 10 MEQ tablet Take 1 tablet (10 mEq total) by mouth once. Take 1 pill twice a day for 3 days then 1 pill a day. 30 tablet 0  . Sennosides-Docusate Sodium (STOOL SOFTENER LAXATIVE PO) Take 1 tablet by mouth 2  (two) times daily.     Marland Kitchen zolpidem (AMBIEN) 10 MG tablet Take 1 tablet by mouth at bedtime as needed.     No current facility-administered medications for this visit.   Facility-Administered Medications Ordered in Other Visits  Medication Dose Route Frequency Provider Last Rate Last Dose  . acetaminophen (TYLENOL) tablet 650 mg  650 mg Oral Once Lequita Asal, MD   650 mg at 10/19/14 8315  . heparin lock flush 100 unit/mL  500 Units Intravenous Once Lequita Asal, MD      . sodium chloride 0.9 % injection 10 mL  10 mL Intracatheter PRN Lequita Asal, MD   10 mL at 10/11/14 1431  . sodium chloride 0.9 % injection 10 mL  10 mL Intracatheter PRN Lequita Asal, MD   10 mL at 02/28/15 1529  . sodium chloride flush (NS) 0.9 % injection 10 mL  10 mL Intravenous PRN Lequita Asal, MD   10 mL at 06/24/15 0850    Review of Systems:  GENERAL: Feels fine.  No fevers or sweats.  Weight stable. PERFORMANCE STATUS (ECOG): 2. HEENT: Wears a hearing aide. No visual changes, runny nose, sore throat, mouth sores or tenderness. Lungs: No shortness of breath or cough. No hemoptysis. Cardiac: No chest pain, palpitations, orthopnea, or PND. GI:  No further rectal bleeding.  Mild constipation.  No nausea, vomiting, diarrhea,or melena. GU: Wears a Depends pad.  No dysuria or hematuria. Musculoskeletal: Denies back pain. No joint pain. No muscle tenderness. Extremities: No pain or swelling. Skin: No rashes, skin lesions, or ulcers Neuro:  Neuropathy in fingers (stable).  No headache, numbness or weakness, or balance issues. Endocrine: Diabetes.  No thyroid issues, hot flashes or night sweats. Psych: No mood changes, depression or anxiety. Pain: Takes oxycodone every other day. Review of systems: All other systems reviewed and found to be negative.  Physical Exam: Blood pressure 134/79, pulse 60, temperature 96.2 F (35.7 C), temperature source Tympanic, resp. rate 18,  height 5' 5"  (1.651 m), weight 145 lb 1 oz (65.8 kg). GENERAL: Elderly woman sitting comfortably in a wheelchair in the exam room in no acute distress. MENTAL STATUS: Alert and oriented to person, place and time. HEAD: Styled curly brown hair. Normocephalic, atraumatic, face symmetric, no Cushingoid features. EYES: Black rimmed glasses. Hazel/green eyes. Pupils equal round and reactive to light and accomodation. No conjunctivitis or scleral icterus. ENT: Oropharynx clear without lesion. Partial. Tongue normal. Mucous membranes moist.  RESPIRATORY: Clear to auscultation without rales, wheezes or rhonchi. CARDIOVASCULAR: Regular rate and rhythm without murmur, rub or gallop. ABDOMEN: Soft, non-tender with active bowel sounds. No guarding or rebound tenderness. No hepatosplenomegaly. SKIN: No rashes, ulcer or skin lesions.  EXTREMITIES: No edema, no skin discoloration or tenderness. No palpable cords. LYMPH NODES: No palpable cervical, supraclavicular,  axillary or inguinal adenopathy  NEUROLOGICAL: Unremarkable.  Gait slightly unsteady. PSYCH: Appropriate.  Infusion on 06/24/2015  Component Date Value Ref Range Status  . WBC 06/24/2015 9.0  3.6 - 11.0 K/uL Final  . RBC 06/24/2015 3.52* 3.80 - 5.20 MIL/uL Final  . Hemoglobin 06/24/2015 11.0* 12.0 - 16.0 g/dL Final  . HCT 06/24/2015 32.7* 35.0 - 47.0 % Final  . MCV 06/24/2015 93.0  80.0 - 100.0 fL Final  . MCH 06/24/2015 31.2  26.0 - 34.0 pg Final  . MCHC 06/24/2015 33.5  32.0 - 36.0 g/dL Final  . RDW 06/24/2015 17.0* 11.5 - 14.5 % Final  . Platelets 06/24/2015 255  150 - 440 K/uL Final  . Neutrophils Relative % 06/24/2015 74   Final  . Neutro Abs 06/24/2015 6.8* 1.4 - 6.5 K/uL Final  . Lymphocytes Relative 06/24/2015 14   Final  . Lymphs Abs 06/24/2015 1.2  1.0 - 3.6 K/uL Final  . Monocytes Relative 06/24/2015 7   Final  . Monocytes Absolute 06/24/2015 0.6  0.2 - 0.9 K/uL Final  . Eosinophils Relative 06/24/2015 4    Final  . Eosinophils Absolute 06/24/2015 0.3  0 - 0.7 K/uL Final  . Basophils Relative 06/24/2015 1   Final  . Basophils Absolute 06/24/2015 0.1  0 - 0.1 K/uL Final    Assessment:  Sandy [name] is a 70 y.o. female with metastatic colon cancer. She presented with a 53 pound weight loss in 6 months. Colonoscopy on 09/07/2014 revealed a circumferential partially obstructive fungating mass in the descending colon. There was no active bleeding. There was a 1.5 cm pedunculated polyp in the sigmoid colon (tubulovillous adenoma). Pathology from the descending colon mass revealed fragments of adenocarcinoma. There was not enough tissue for KRAS testing.  CEA was 11,793 on 09/05/2014.  Chest CT angiogram on 08/28/2014 revealed no evidence of pulmonary embolism, but multiple large enhancing liver masses (largest 6.4 cm).  PET scan on 10/04/2014 revealed 6.5 x 6.1 cm hypermetabolic mass in the proximal sigmoid colon,  There were  numerous large hypermetabolic hepatic metastases. There were no additional sites of metastatic disease in the neck, chest or skeleton.  In addition, there was a 3.2 x 2.8 cm low-attenuation right thyroid nodule.   She is status post 14 cycles of FOLFOX chemotherapy (10/09/2014 - 04/11/2015).  She declined Avastin.  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  CEA was 10,169 on 10/09/2014 , 12,034 on 0712/2016, 7765 on 11/20/2014, 5154 on 12/04/2014, 1921 on 01/01/2015, 1266 on 01/15/2015, 770.6 on 01/29/2015, 502.7 on 02/12/2015, 368.7 on 02/26/2015, 269 on 03/14/2015, 178.3 on 03/28/2015, 135.9 on 04/11/2015, and 116.1 on 05/13/2015.  Abdominal and pelvic CT scan on 12/28/2014 revealed response to therapy.  Liver metastases and the descending/sigmoid mass were smaller.  There was no new disease.  Abdomen and pelvic CT scan on 03/25/2015 revealed interval decrease in size of hepatic metastasis and distal descending colon mass.   She is status post 3 cycles  of 5-fluorouracil and leucovorin (04/28/2014 - 05/27/2015).  Cycle #4 was held on 06/10/2015 secondary to rectal bleeding.  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  She is on Neurontin 200 mg po qhs and 100 mg during the day.  The evening dose makes her sleepy.  Symptomatically, she has had no further rectal bleeding.  She has 3 bowel movements a week.  Constipation is managed by Senna.   Plan: 1.  Labs today:  CBC with diff, CMP, CEA,  Mg. 2.  Discuss follow-up with Dr. Allen Norris.  Discuss initial scans with metastatic disease to liver and non-obstructing colon lesion.  Tumor has decreased significantly with time, but liver lesions still persist.  No obstruction at this time.  Discuss consultation with surgery regarding management of colon. 3.  Cycle #4 5-FU/LV today. 4.  RTC in 2 days for disconnect. 5.  Increase Neurontin to q 8 hours (100 mg day time doses and 200 mg at night).   6.  Patient to contact clinic if rectal bleeding recurs. 7.  RTC in 2 weeks for MD assess, labs (CBC with diff, CMP, Mg, CEA), and cycle #4 5FU/LV.   Lequita Asal, MD  06/24/2015, 9:23 AM

## 2015-06-25 ENCOUNTER — Telehealth: Payer: Self-pay | Admitting: Surgery

## 2015-06-25 ENCOUNTER — Other Ambulatory Visit: Payer: Self-pay

## 2015-06-25 NOTE — Telephone Encounter (Signed)
Call made to Brandy (Dr. Kem Parkinson Nurse) at [facility] at this time to explain what is going on with patient so that she may call and speak with patient's family. She has left for today. Will call once again tomorrow.

## 2015-06-25 NOTE — Telephone Encounter (Signed)
Patients daughter, [name] called back. I attempted to make the patient an appointment with Dr Dahlia Byes tomorrow for a surgical consult and [name] told me, "No, we need more advance notice than this for an appointment. Dr Mike Gip didn't act like this needed to be immediate to meet with the surgeon. She isn't having surgery tomorrow is she?" I told her, "no the patient isn't having surgery tomorrow, but it is important to meet with the surgeon so that they can move forward." [place] said, " I would really like to push this out to April because me, my sister and her grandson are the ones supporting her and taking her to appointments so we need to adjust our work schedules and take time off."  I could not get her to make an appointment at this time. Please have someone from the cancer center call her to explain things.

## 2015-06-25 NOTE — Telephone Encounter (Signed)
I have called patient to make an appointment per referral. No answer. I have left a detailed message on voicemail. Patient will need to see a general surgeon for Surgery Consult/Referral for future planning pt with metastatic colon cancer. Non obstructing colon lesion.

## 2015-06-26 ENCOUNTER — Inpatient Hospital Stay: Payer: Medicare Other

## 2015-06-26 DIAGNOSIS — C801 Malignant (primary) neoplasm, unspecified: Secondary | ICD-10-CM

## 2015-06-26 DIAGNOSIS — Z5111 Encounter for antineoplastic chemotherapy: Secondary | ICD-10-CM | POA: Diagnosis not present

## 2015-06-26 MED ORDER — HEPARIN SOD (PORK) LOCK FLUSH 100 UNIT/ML IV SOLN
500.0000 [IU] | Freq: Once | INTRAVENOUS | Status: AC
  Administered 2015-06-26: 500 [IU] via INTRAVENOUS
  Filled 2015-06-26: qty 5

## 2015-06-26 MED ORDER — SODIUM CHLORIDE 0.9% FLUSH
10.0000 mL | INTRAVENOUS | Status: DC | PRN
  Administered 2015-06-26: 10 mL via INTRAVENOUS
  Filled 2015-06-26: qty 10

## 2015-06-27 ENCOUNTER — Encounter: Payer: Self-pay | Admitting: Hematology and Oncology

## 2015-07-08 ENCOUNTER — Inpatient Hospital Stay: Payer: Medicare Other

## 2015-07-08 ENCOUNTER — Inpatient Hospital Stay (HOSPITAL_BASED_OUTPATIENT_CLINIC_OR_DEPARTMENT_OTHER): Payer: Medicare Other | Admitting: Hematology and Oncology

## 2015-07-08 VITALS — BP 137/81 | HR 57 | Temp 96.0°F | Resp 20 | Wt 142.9 lb

## 2015-07-08 DIAGNOSIS — Z5111 Encounter for antineoplastic chemotherapy: Secondary | ICD-10-CM | POA: Diagnosis not present

## 2015-07-08 DIAGNOSIS — Z7982 Long term (current) use of aspirin: Secondary | ICD-10-CM

## 2015-07-08 DIAGNOSIS — G4730 Sleep apnea, unspecified: Secondary | ICD-10-CM

## 2015-07-08 DIAGNOSIS — Z87891 Personal history of nicotine dependence: Secondary | ICD-10-CM

## 2015-07-08 DIAGNOSIS — G629 Polyneuropathy, unspecified: Secondary | ICD-10-CM

## 2015-07-08 DIAGNOSIS — I10 Essential (primary) hypertension: Secondary | ICD-10-CM

## 2015-07-08 DIAGNOSIS — R634 Abnormal weight loss: Secondary | ICD-10-CM

## 2015-07-08 DIAGNOSIS — R197 Diarrhea, unspecified: Secondary | ICD-10-CM

## 2015-07-08 DIAGNOSIS — C189 Malignant neoplasm of colon, unspecified: Secondary | ICD-10-CM

## 2015-07-08 DIAGNOSIS — Z95828 Presence of other vascular implants and grafts: Secondary | ICD-10-CM

## 2015-07-08 DIAGNOSIS — M129 Arthropathy, unspecified: Secondary | ICD-10-CM

## 2015-07-08 DIAGNOSIS — C787 Secondary malignant neoplasm of liver and intrahepatic bile duct: Secondary | ICD-10-CM | POA: Diagnosis not present

## 2015-07-08 DIAGNOSIS — K5900 Constipation, unspecified: Secondary | ICD-10-CM

## 2015-07-08 DIAGNOSIS — Z79899 Other long term (current) drug therapy: Secondary | ICD-10-CM

## 2015-07-08 DIAGNOSIS — E119 Type 2 diabetes mellitus without complications: Secondary | ICD-10-CM

## 2015-07-08 DIAGNOSIS — K625 Hemorrhage of anus and rectum: Secondary | ICD-10-CM

## 2015-07-08 DIAGNOSIS — Z7984 Long term (current) use of oral hypoglycemic drugs: Secondary | ICD-10-CM

## 2015-07-08 LAB — COMPREHENSIVE METABOLIC PANEL
ALT: 9 U/L — ABNORMAL LOW (ref 14–54)
AST: 19 U/L (ref 15–41)
Albumin: 3.5 g/dL (ref 3.5–5.0)
Alkaline Phosphatase: 116 U/L (ref 38–126)
Anion gap: 6 (ref 5–15)
BUN: 17 mg/dL (ref 6–20)
CO2: 24 mmol/L (ref 22–32)
Calcium: 9.8 mg/dL (ref 8.9–10.3)
Chloride: 109 mmol/L (ref 101–111)
Creatinine, Ser: 0.84 mg/dL (ref 0.44–1.00)
GFR calc Af Amer: >60 mL/min (ref >60)
GFR calc non Af Amer: >60 mL/min (ref >60)
Glucose, Bld: 158 mg/dL — ABNORMAL HIGH (ref 65–99)
Potassium: 3.9 mmol/L (ref 3.5–5.1)
Sodium: 139 mmol/L (ref 135–145)
Total Bilirubin: 0.7 mg/dL (ref 0.3–1.2)
Total Protein: 6.9 g/dL (ref 6.5–8.1)

## 2015-07-08 LAB — CBC WITH DIFFERENTIAL/PLATELET
Basophils Absolute: 0 10*3/uL (ref 0–0.1)
Basophils Relative: 0 %
Eosinophils Absolute: 0.3 10*3/uL (ref 0–0.7)
Eosinophils Relative: 4 %
HCT: 33 % — ABNORMAL LOW (ref 35.0–47.0)
Hemoglobin: 11.1 g/dL — ABNORMAL LOW (ref 12.0–16.0)
Lymphocytes Relative: 17 %
Lymphs Abs: 1.4 10*3/uL (ref 1.0–3.6)
MCH: 31.3 pg (ref 26.0–34.0)
MCHC: 33.5 g/dL (ref 32.0–36.0)
MCV: 93.3 fL (ref 80.0–100.0)
Monocytes Absolute: 0.5 10*3/uL (ref 0.2–0.9)
Monocytes Relative: 6 %
Neutro Abs: 5.9 10*3/uL (ref 1.4–6.5)
Neutrophils Relative %: 73 %
Platelets: 210 10*3/uL (ref 150–440)
RBC: 3.54 MIL/uL — ABNORMAL LOW (ref 3.80–5.20)
RDW: 16.1 % — ABNORMAL HIGH (ref 11.5–14.5)
WBC: 8.1 10*3/uL (ref 3.6–11.0)

## 2015-07-08 LAB — MAGNESIUM: Magnesium: 1.9 mg/dL (ref 1.7–2.4)

## 2015-07-08 MED ORDER — LEUCOVORIN CALCIUM INJECTION 100 MG
20.0000 mg/m2 | Freq: Once | INTRAMUSCULAR | Status: AC
  Administered 2015-07-08: 32 mg via INTRAVENOUS
  Filled 2015-07-08: qty 1.6

## 2015-07-08 MED ORDER — SODIUM CHLORIDE 0.9 % IV SOLN
INTRAVENOUS | Status: DC
  Administered 2015-07-08: 10:00:00 via INTRAVENOUS
  Filled 2015-07-08: qty 1000

## 2015-07-08 MED ORDER — HEPARIN SOD (PORK) LOCK FLUSH 100 UNIT/ML IV SOLN: 500.0000 [IU] | Freq: Once | INTRAVENOUS | Status: DC

## 2015-07-08 MED ORDER — FLUOROURACIL CHEMO INJECTION 5 GM/100ML
2400.0000 mg/m2 | INTRAVENOUS | Status: DC
  Administered 2015-07-08: 3900 mg via INTRAVENOUS
  Filled 2015-07-08: qty 78

## 2015-07-08 MED ORDER — LEUCOVORIN CALCIUM INJECTION 350 MG: 650.0000 mg | Freq: Once | INTRAVENOUS | Status: DC

## 2015-07-08 MED ORDER — SODIUM CHLORIDE 0.9 % IV SOLN
Freq: Once | INTRAVENOUS | Status: AC
  Administered 2015-07-08: 10:00:00 via INTRAVENOUS
  Filled 2015-07-08: qty 4

## 2015-07-08 MED ORDER — LIDOCAINE-PRILOCAINE 2.5-2.5 % EX CREA: 1.0000 "application " | TOPICAL_CREAM | CUTANEOUS | Status: DC | PRN

## 2015-07-08 MED ORDER — FLUOROURACIL CHEMO INJECTION 2.5 GM/50ML
400.0000 mg/m2 | Freq: Once | INTRAVENOUS | Status: AC
  Administered 2015-07-08: 650 mg via INTRAVENOUS
  Filled 2015-07-08: qty 13

## 2015-07-08 MED ORDER — SODIUM CHLORIDE 0.9% FLUSH
10.0000 mL | INTRAVENOUS | Status: DC | PRN
  Administered 2015-07-08: 10 mL via INTRAVENOUS
  Filled 2015-07-08: qty 10

## 2015-07-08 NOTE — Progress Notes (Signed)
[facility] day:  07/08/2015  Chief Complaint: Sandy [name] is a 70 y.o. female with stage IV colon cancer who is seen for assessment prior to cycle #5 5-fluorouracil and leucovorin chemotherapy.  HPI: The patient was last seen by me in the medical oncology clinic on 06/24/2015.  At that time, she had progressive neuropathy.  Neurontin was increased to 200 mg at night and 100 mg twice during the day as the night time dose made her sleepy.  We discussed consultation with surgery after her follow-up with Dr. Allen Norris.  She received cycle #4 5-fluorouracil and leucovorin uneventfully.  During the interim, she has felt good.  She notes that her neuropathy is the same.  She denies any pain. She has had some diarrhea secondary to her stool softener regimen instituted for constipation.  Past Medical History  Diagnosis Date  . Hypertension   . Arthritis   . Constipation   . Insomnia   . Diabetes mellitus without complication ([place])   . Cancer [facility])     liver mets; colon cancer    Past Surgical History  Procedure Laterality Date  . Colonoscopy with propofol N/A 09/07/2014    Procedure: COLONOSCOPY WITH PROPOFOL;  Surgeon: Lucilla Lame, MD;  Location: [facility] ENDOSCOPY;  Service: Endoscopy;  Laterality: N/A;  . Esophagogastroduodenoscopy N/A 09/07/2014    Procedure: ESOPHAGOGASTRODUODENOSCOPY (EGD);  Surgeon: Lucilla Lame, MD;  Location: [facility] ENDOSCOPY;  Service: Endoscopy;  Laterality: N/A;  . Portacath placement Left 09/24/2014    Procedure: INSERTION PORT-A-CATH;  Surgeon: Robert Bellow, MD;  Location: [facility] ORS;  Service: General;  Laterality: Left;  . Liver biopsy      Family History  Problem Relation Age of Onset  . Cancer Sister     Social History:  reports that she quit smoking about 12 years ago. Her smoking use included Cigarettes. She has a 5 pack-year smoking history. She has never used smokeless tobacco. She reports that she does not  drink alcohol or use illicit drugs.  The patient is accompanied by her daughter.   Allergies: No Known Allergies  Current Medications: Current Outpatient Prescriptions  Medication Sig Dispense Refill  . amLODipine (NORVASC) 5 MG tablet Take 5 mg by mouth daily.    Marland Kitchen aspirin EC 81 MG tablet Take 81 mg by mouth daily.    Marland Kitchen atorvastatin (LIPITOR) 40 MG tablet Take 40 mg by mouth daily.    Marland Kitchen gabapentin (NEURONTIN) 100 MG capsule Take 2 capsules (200 mg total) by mouth at bedtime. (Patient taking differently: Take 200 mg by mouth at bedtime. 1 tab daily and 2 tabs at bedtime.) 60 capsule 0  . lidocaine-prilocaine (EMLA) cream Apply 1 application topically as needed. Apply to port 1 hour prior to chemotherapy appointment. Cover with plastic wrap. 30 g 1  . lisinopril-hydrochlorothiazide (PRINZIDE,ZESTORETIC) 20-25 MG per tablet Take 1 tablet by mouth daily.    . megestrol (MEGACE) 400 MG/10ML suspension Take 5 mLs (200 mg total) by mouth daily. to stimulate appetite 240 mL 0  . metFORMIN (GLUCOPHAGE) 1000 MG tablet Take 1,000 mg by mouth 2 (two) times daily.     . metoprolol succinate (TOPROL-XL) 25 MG 24 hr tablet Take 25 mg by mouth daily.    . Multiple Vitamin (MULTIVITAMIN WITH MINERALS) TABS tablet Take 1 tablet by mouth daily.    . ondansetron (ZOFRAN) 4 MG tablet Take 1 tablet (4 mg total) by mouth every 8 (eight) hours as needed for nausea  or vomiting. 30 tablet 2  . potassium chloride (K-DUR,KLOR-CON) 10 MEQ tablet Take 1 tablet (10 mEq total) by mouth once. Take 1 pill twice a day for 3 days then 1 pill a day. 30 tablet 0  . Sennosides-Docusate Sodium (STOOL SOFTENER LAXATIVE PO) Take 1 tablet by mouth 2 (two) times daily.     Marland Kitchen zolpidem (AMBIEN) 10 MG tablet Take 1 tablet by mouth at bedtime as needed.    Marland Kitchen oxyCODONE-acetaminophen (PERCOCET/ROXICET) 5-325 MG tablet Take 1 tablet by mouth every 6 (six) hours as needed for severe pain. 30 tablet 0   No current facility-administered  medications for this visit.   Facility-Administered Medications Ordered in Other Visits  Medication Dose Route Frequency Provider Last Rate Last Dose  . acetaminophen (TYLENOL) tablet 650 mg  650 mg Oral Once Lequita Asal, MD   650 mg at 10/19/14 5364  . sodium chloride 0.9 % injection 10 mL  10 mL Intracatheter PRN Lequita Asal, MD   10 mL at 10/11/14 1431  . sodium chloride 0.9 % injection 10 mL  10 mL Intracatheter PRN Lequita Asal, MD   10 mL at 02/28/15 1529    Review of Systems:  GENERAL: Feels good.  No fevers or sweats.  Weight down 3 pounds. PERFORMANCE STATUS (ECOG): 2. HEENT: Wears a hearing aide. No visual changes, runny nose, sore throat, mouth sores or tenderness. Lungs: No shortness of breath or cough. No hemoptysis. Cardiac: No chest pain, palpitations, orthopnea, or PND. GI:  Diarrhea caused by excess stool softeners for constipation.  No nausea, vomiting, diarrhea,or melena. GU: Wears a Depends pad.  No dysuria or hematuria. Musculoskeletal: Denies back pain. No joint pain. No muscle tenderness. Extremities: No pain or swelling. Skin: No rashes, skin lesions, or ulcers Neuro:  Neuropathy in fingers and bottom of feeet (stable).  No headache, numbness or weakness, or balance issues. Endocrine: Diabetes.  No thyroid issues, hot flashes or night sweats. Psych: No mood changes, depression or anxiety. Pain: Takes oxycodone every other day. Review of systems: All other systems reviewed and found to be negative.  Physical Exam: Blood pressure 137/81, pulse 57, temperature 96 F (35.6 C), temperature source Tympanic, resp. rate 20, weight 142 lb 13.7 oz (64.8 kg). GENERAL: Elderly woman sitting comfortably in a wheelchair in the exam room in no acute distress. MENTAL STATUS: Alert and oriented to person, place and time. HEAD: Styled curly brown hair. Normocephalic, atraumatic, face symmetric, no Cushingoid features. EYES: Black rimmed  glasses. Hazel/green eyes. Pupils equal round and reactive to light and accomodation. No conjunctivitis or scleral icterus. ENT: Oropharynx clear without lesion. Partial. Tongue normal. Mucous membranes moist.  RESPIRATORY: Clear to auscultation without rales, wheezes or rhonchi. CARDIOVASCULAR: Regular rate and rhythm without murmur, rub or gallop. ABDOMEN: Soft, non-tender with active bowel sounds. No guarding or rebound tenderness. No hepatosplenomegaly. SKIN: No rashes, ulcer or skin lesions.  EXTREMITIES: No edema, no skin discoloration or tenderness. No palpable cords. LYMPH NODES: No palpable cervical, supraclavicular, axillary or inguinal adenopathy  NEUROLOGICAL: Unremarkable.  Gait slightly unsteady. PSYCH: Appropriate.  Infusion on 07/08/2015  Component Date Value Ref Range Status  . WBC 07/08/2015 8.1  3.6 - 11.0 K/uL Final  . RBC 07/08/2015 3.54* 3.80 - 5.20 MIL/uL Final  . Hemoglobin 07/08/2015 11.1* 12.0 - 16.0 g/dL Final  . HCT 07/08/2015 33.0* 35.0 - 47.0 % Final  . MCV 07/08/2015 93.3  80.0 - 100.0 fL Final  . MCH 07/08/2015 31.3  26.0 -  34.0 pg Final  . MCHC 07/08/2015 33.5  32.0 - 36.0 g/dL Final  . RDW 07/08/2015 16.1* 11.5 - 14.5 % Final  . Platelets 07/08/2015 210  150 - 440 K/uL Final  . Neutrophils Relative % 07/08/2015 73   Final  . Neutro Abs 07/08/2015 5.9  1.4 - 6.5 K/uL Final  . Lymphocytes Relative 07/08/2015 17   Final  . Lymphs Abs 07/08/2015 1.4  1.0 - 3.6 K/uL Final  . Monocytes Relative 07/08/2015 6   Final  . Monocytes Absolute 07/08/2015 0.5  0.2 - 0.9 K/uL Final  . Eosinophils Relative 07/08/2015 4   Final  . Eosinophils Absolute 07/08/2015 0.3  0 - 0.7 K/uL Final  . Basophils Relative 07/08/2015 0   Final  . Basophils Absolute 07/08/2015 0.0  0 - 0.1 K/uL Final  . Sodium 07/08/2015 139  135 - 145 mmol/L Final  . Potassium 07/08/2015 3.9  3.5 - 5.1 mmol/L Final  . Chloride 07/08/2015 109  101 - 111 mmol/L Final  . CO2 07/08/2015  24  22 - 32 mmol/L Final  . Glucose, Bld 07/08/2015 158* 65 - 99 mg/dL Final  . BUN 07/08/2015 17  6 - 20 mg/dL Final  . Creatinine, Ser 07/08/2015 0.84  0.44 - 1.00 mg/dL Final  . Calcium 07/08/2015 9.8  8.9 - 10.3 mg/dL Final  . Total Protein 07/08/2015 6.9  6.5 - 8.1 g/dL Final  . Albumin 07/08/2015 3.5  3.5 - 5.0 g/dL Final  . AST 07/08/2015 19  15 - 41 U/L Final  . ALT 07/08/2015 9* 14 - 54 U/L Final  . Alkaline Phosphatase 07/08/2015 116  38 - 126 U/L Final  . Total Bilirubin 07/08/2015 0.7  0.3 - 1.2 mg/dL Final  . GFR calc non Af Amer 07/08/2015 >60  >60 mL/min Final  . GFR calc Af Amer 07/08/2015 >60  >60 mL/min Final   Comment: (NOTE) The eGFR has been calculated using the CKD EPI equation. This calculation has not been validated in all clinical situations. eGFR's persistently <60 mL/min signify possible Chronic Kidney Disease.   . Anion gap 07/08/2015 6  5 - 15 Final  . Magnesium 07/08/2015 1.9  1.7 - 2.4 mg/dL Final  . CEA 07/08/2015 145.9* 0.0 - 4.7 ng/mL Final   Comment: (NOTE)       Roche ECLIA methodology       Nonsmokers  <3.9                                     Smokers     <5.6 Performed At: [facility] [address] [place], [place] [id number] Lindon Romp MD WU:[id number]     Assessment:  Sandy [name] is a 70 y.o. female with metastatic colon cancer. She presented with a 53 pound weight loss in 6 months. Colonoscopy on 09/07/2014 revealed a circumferential partially obstructive fungating mass in the descending colon. There was no active bleeding. There was a 1.5 cm pedunculated polyp in the sigmoid colon (tubulovillous adenoma). Pathology from the descending colon mass revealed fragments of adenocarcinoma. There was not enough tissue for KRAS testing.  CEA was 11,793 on 09/05/2014.  Chest CT angiogram on 08/28/2014 revealed no evidence of pulmonary embolism, but multiple large enhancing liver masses (largest 6.4 cm).  PET scan on  10/04/2014 revealed 6.5 x 6.1 cm hypermetabolic mass in the proximal sigmoid colon,  There were  numerous large  hypermetabolic hepatic metastases. There were no additional sites of metastatic disease in the neck, chest or skeleton.  In addition, there was a 3.2 x 2.8 cm low-attenuation right thyroid nodule.   She is status post 14 cycles of FOLFOX chemotherapy (10/09/2014 - 04/11/2015).  She declined Avastin.  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  CEA was 10,169 on 10/09/2014 , 12,034 on 0712/2016, 7765 on 11/20/2014, 5154 on 12/04/2014, 1921 on 01/01/2015, 1266 on 01/15/2015, 770.6 on 01/29/2015, 502.7 on 02/12/2015, 368.7 on 02/26/2015, 269 on 03/14/2015, 178.3 on 03/28/2015, 135.9 on 04/11/2015, 116.1 on 05/13/2015, and 90.8 on 06/10/2015.  Abdominal and pelvic CT scan on 12/28/2014 revealed response to therapy.  Liver metastases and the descending/sigmoid mass were smaller.  There was no new disease.  Abdomen and pelvic CT scan on 03/25/2015 revealed interval decrease in size of hepatic metastasis and distal descending colon mass.   She is status post 4 cycles of 5-fluorouracil and leucovorin (04/28/2014 - 06/24/2015).  Cycle #4 was held on 06/10/2015 secondary to rectal bleeding.  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  She is on Neurontin 200 mg po qhs and 100 mg during the day.  The evening dose makes her sleepy.  Symptomatically, she has had some diarrhea secondary to frequent Senna dosing.  Exam is stable.  Plan: 1.  Labs today:  CBC with diff, CMP, CEA, Mg. 2.  Cycle #5 5-FU/LV today. 3.  RTC in 2 days for disconnect. 4.  Discuss decrease Senna to every 3rd day. 5.  Continue current dose of Neurontin.  Increase in 100 mg increments as needed secondary to fatigue.   6.  Follow-up with Dr. Azalee Course on 07/11/2015. 7.  RTC in 2 weeks for MD assess, labs (CBC with diff, CMP, Mg), and cycle #6 5FU/LV.   Lequita Asal, MD  07/08/2015, 8:37 AM

## 2015-07-09 LAB — CEA: CEA: 145.9 ng/mL — ABNORMAL HIGH (ref 0.0–4.7)

## 2015-07-10 ENCOUNTER — Inpatient Hospital Stay: Payer: Medicare Other

## 2015-07-10 ENCOUNTER — Other Ambulatory Visit: Payer: Self-pay | Admitting: *Deleted

## 2015-07-10 VITALS — BP 129/80 | HR 59 | Temp 96.1°F

## 2015-07-10 DIAGNOSIS — Z5111 Encounter for antineoplastic chemotherapy: Secondary | ICD-10-CM | POA: Diagnosis not present

## 2015-07-10 DIAGNOSIS — C801 Malignant (primary) neoplasm, unspecified: Secondary | ICD-10-CM

## 2015-07-10 MED ORDER — HEPARIN SOD (PORK) LOCK FLUSH 100 UNIT/ML IV SOLN
500.0000 [IU] | Freq: Once | INTRAVENOUS | Status: AC
  Administered 2015-07-10: 500 [IU] via INTRAVENOUS
  Filled 2015-07-10: qty 5

## 2015-07-10 MED ORDER — OXYCODONE-ACETAMINOPHEN 5-325 MG PO TABS: 1.0000 | ORAL_TABLET | Freq: Four times a day (QID) | ORAL | Status: DC | PRN

## 2015-07-10 MED ORDER — SODIUM CHLORIDE 0.9% FLUSH
10.0000 mL | INTRAVENOUS | Status: DC | PRN
  Administered 2015-07-10: 10 mL via INTRAVENOUS
  Filled 2015-07-10: qty 10

## 2015-07-11 ENCOUNTER — Ambulatory Visit: Payer: Medicare Other | Admitting: Surgery

## 2015-07-14 ENCOUNTER — Encounter: Payer: Self-pay | Admitting: Hematology and Oncology

## 2015-07-15 ENCOUNTER — Other Ambulatory Visit: Payer: Self-pay | Admitting: *Deleted

## 2015-07-15 NOTE — Telephone Encounter (Signed)
Dr Mike Gip, Please clarify dosing and e scribe this please

## 2015-07-16 ENCOUNTER — Encounter: Payer: Self-pay | Admitting: Surgery

## 2015-07-16 ENCOUNTER — Ambulatory Visit (INDEPENDENT_AMBULATORY_CARE_PROVIDER_SITE_OTHER): Payer: Medicare Other | Admitting: Surgery

## 2015-07-16 VITALS — BP 137/84 | HR 57 | Temp 98.7°F | Ht 65.0 in | Wt 146.4 lb

## 2015-07-16 DIAGNOSIS — C787 Secondary malignant neoplasm of liver and intrahepatic bile duct: Secondary | ICD-10-CM | POA: Diagnosis not present

## 2015-07-16 DIAGNOSIS — C189 Malignant neoplasm of colon, unspecified: Secondary | ICD-10-CM

## 2015-07-16 MED ORDER — GABAPENTIN 100 MG PO CAPS: 200.0000 mg | ORAL_CAPSULE | Freq: Every day | ORAL | Status: DC

## 2015-07-16 NOTE — Progress Notes (Signed)
Subjective:     Patient ID: Sandy [name], female   DOB: [birth year], 70 y.o.   MRN: [id number]  HPI  70 year old female with metastatic colon cancer with metastases to the liver. Patient has been through her 6 rounds of chemotherapy with showing a good response EEA levels of come down significantly as well as decrease in the size of the colon lesion and decrease in the size of the liver metastases down to a met in the right lateral lobe of the liver that's 2 cm and another met in the left lobe of the liver that's slightly less than 2 cm.  Patient does interchange between constipation and diarrhea however she uses senna or sorbitol solution as needed for constipation. Patient has not had any further bleeding episodes since the fourth round of chemotherapy. Otherwise patient is getting along well and feels good.  Past Medical History  Diagnosis Date  . Hypertension   . Arthritis   . Constipation   . Insomnia   . Diabetes mellitus without complication ([place])   . Cancer [facility])     liver mets; colon cancer   Past Surgical History  Procedure Laterality Date  . Colonoscopy with propofol N/A 09/07/2014    Procedure: COLONOSCOPY WITH PROPOFOL;  Surgeon: Lucilla Lame, MD;  Location: [facility] ENDOSCOPY;  Service: Endoscopy;  Laterality: N/A;  . Esophagogastroduodenoscopy N/A 09/07/2014    Procedure: ESOPHAGOGASTRODUODENOSCOPY (EGD);  Surgeon: Lucilla Lame, MD;  Location: [facility] ENDOSCOPY;  Service: Endoscopy;  Laterality: N/A;  . Portacath placement Left 09/24/2014    Procedure: INSERTION PORT-A-CATH;  Surgeon: Robert Bellow, MD;  Location: [facility] ORS;  Service: General;  Laterality: Left;  . Liver biopsy     Family History  Problem Relation Age of Onset  . Cancer Sister    Social History   Social History  . Marital Status: Widowed    Spouse Name: N/A  . Number of Children: N/A  . Years of Education: N/A   Social History Main Topics  . Smoking status: Former Smoker -- 0.25 packs/day for 20 years     Types: Cigarettes    Quit date: 04/14/2003  . Smokeless tobacco: Never Used  . Alcohol Use: No  . Drug Use: No  . Sexual Activity: No   Other Topics Concern  . None   Social History Narrative    Current outpatient prescriptions:  .  amLODipine (NORVASC) 5 MG tablet, Take 5 mg by mouth daily., Disp: , Rfl:  .  aspirin EC 81 MG tablet, Take 81 mg by mouth daily., Disp: , Rfl:  .  atorvastatin (LIPITOR) 40 MG tablet, Take 40 mg by mouth daily., Disp: , Rfl:  .  gabapentin (NEURONTIN) 100 MG capsule, Take 2 capsules (200 mg total) by mouth at bedtime. 1 tab daily and 2 tabs at bedtime., Disp: 60 capsule, Rfl: 0 .  lidocaine-prilocaine (EMLA) cream, Apply 1 application topically as needed. Apply to port 1 hour prior to chemotherapy appointment. Cover with plastic wrap., Disp: 30 g, Rfl: 1 .  lisinopril-hydrochlorothiazide (PRINZIDE,ZESTORETIC) 20-25 MG per tablet, Take 1 tablet by mouth daily., Disp: , Rfl:  .  magnesium 30 MG tablet, Take by mouth daily., Disp: , Rfl:  .  megestrol (MEGACE) 400 MG/10ML suspension, Take 5 mLs (200 mg total) by mouth daily. to stimulate appetite, Disp: 240 mL, Rfl: 0 .  metFORMIN (GLUCOPHAGE) 1000 MG tablet, Take 1,000 mg by mouth 2 (two) times daily. , Disp: , Rfl:  .  metoprolol succinate (TOPROL-XL) 25 MG  24 hr tablet, Take 25 mg by mouth daily., Disp: , Rfl:  .  Multiple Vitamin (MULTIVITAMIN WITH MINERALS) TABS tablet, Take 1 tablet by mouth daily., Disp: , Rfl:  .  ondansetron (ZOFRAN) 4 MG tablet, Take 1 tablet (4 mg total) by mouth every 8 (eight) hours as needed for nausea or vomiting., Disp: 30 tablet, Rfl: 2 .  oxyCODONE-acetaminophen (PERCOCET/ROXICET) 5-325 MG tablet, Take 1 tablet by mouth every 6 (six) hours as needed for severe pain., Disp: 30 tablet, Rfl: 0 .  potassium chloride (K-DUR,KLOR-CON) 10 MEQ tablet, Take 1 tablet (10 mEq total) by mouth once. Take 1 pill twice a day for 3 days then 1 pill a day., Disp: 30 tablet, Rfl: 0 .   sorbitol 70 % solution, Take 30 mLs by mouth daily as needed., Disp: , Rfl:  No current facility-administered medications for this visit.  Facility-Administered Medications Ordered in Other Visits:  .  acetaminophen (TYLENOL) tablet 650 mg, 650 mg, Oral, Once, Lequita Asal, MD, 650 mg at 10/19/14 5409 .  sodium chloride 0.9 % injection 10 mL, 10 mL, Intracatheter, PRN, Lequita Asal, MD, 10 mL at 10/11/14 1431 .  sodium chloride 0.9 % injection 10 mL, 10 mL, Intracatheter, PRN, Lequita Asal, MD, 10 mL at 02/28/15 1529 No Known Allergies    Review of Systems  Constitutional: Negative for fever, chills, activity change, appetite change, fatigue and unexpected weight change.  HENT: Negative for congestion and sore throat.   Respiratory: Negative for cough and wheezing.   Cardiovascular: Negative for chest pain and leg swelling.  Gastrointestinal: Positive for diarrhea and constipation. Negative for nausea, vomiting, abdominal pain and blood in stool.  Genitourinary: Negative for dysuria.  Musculoskeletal: Negative for back pain.  Skin: Negative for color change, pallor and rash.  Neurological: Negative for weakness.  Hematological: Negative for adenopathy. Does not bruise/bleed easily.  Psychiatric/Behavioral: Negative for agitation. The patient is not nervous/anxious.   All other systems reviewed and are negative.      Filed Vitals:   07/16/15 1135  BP: 137/84  Pulse: 57  Temp: 98.7 F (37.1 C)    Objective:   Physical Exam  Constitutional: She is oriented to person, place, and time. She appears well-developed and well-nourished. No distress.  HENT:  Head: Normocephalic and atraumatic.  Right Ear: External ear normal.  Left Ear: External ear normal.  Nose: Nose normal.  Mouth/Throat: Oropharynx is clear and moist. No oropharyngeal exudate.  Eyes: Conjunctivae and EOM are normal. Pupils are equal, round, and reactive to light. No scleral icterus.  Neck:  Normal range of motion. Neck supple. No tracheal deviation present.  Cardiovascular: Normal rate, regular rhythm, normal heart sounds and intact distal pulses.  Exam reveals no gallop and no friction rub.   No murmur heard. Pulmonary/Chest: Effort normal and breath sounds normal. No respiratory distress. She has no wheezes. She has no rales.  Abdominal: Soft. Bowel sounds are normal. She exhibits no distension. There is no tenderness. There is no rebound.  Musculoskeletal: Normal range of motion. She exhibits no edema or tenderness.  Neurological: She is alert and oriented to person, place, and time. No cranial nerve deficit.  Skin: Skin is warm and dry. No rash noted. No erythema. No pallor.  Psychiatric: She has a normal mood and affect. Her behavior is normal. Judgment and thought content normal.  Vitals reviewed.      Assessment:     71 yr old with metastatic colon cancer  Plan:     I have personally reviewed the patient's past medical history including her past chemotherapy treatments and notes with Dr. Mike Gip.  I've also personally reviewed her laboratory values with a significant decrease in the CEA with the chemotherapy. I also personally reviewed her CT scan measured images both from September and from December showing a significant decrease in the size of the metastases in the liver down to 2 areas in the right lateral lobe and one in the left medial side both of which are about 2cm in diameter.  I discussed with the patient that at this point she would be ready for a colon resection which should be able to be done laparoscopically but due to the good response of the chemotherapy leaving her with only 2 metastases in the liver that these should be resected at the same time if possible.  Because living delivery resection could be done along with the colon resection our refer her to a surgical oncologist and gave her the option between [place]: [facility] and [facility], patient and  family would like a referral to [facility]. I did discuss with them that even if they don't feel they can resect both areas in the liver that likely would be able to resect at least one or maybe do some other ablative her chemotherapy directed at the metastases in the liver. Patient and family were given opportunity ask questions and have them answered. They are in agreement with the plan.

## 2015-07-16 NOTE — Patient Instructions (Signed)
We will make a referral to [facility] as you have requested for your surgery.  We will call as soon as we have an appointment for you.  Call with any questions or concerns prior to your appointment.

## 2015-07-17 ENCOUNTER — Telehealth: Payer: Self-pay | Admitting: *Deleted

## 2015-07-17 NOTE — Telephone Encounter (Signed)
Pharmacy needs clarification of gabapentin. Spoke to md and pt's caregiver and md wanted her to take extra pill during the day but pt sleeps so much caregiver said she is afraid she is going to sleep all day. Pt already gets up in am and after eating and taking 1 pill in am she goes back to sleep and sleeps til 1-2 pm.  Then she take 2 at night and she is scared to add another one in afternoon. md ok with 1 in am and 2 at night. [facility] and left message with message with 3 pills a day. 1 in am and 2 in pm quantity 90 and 3 refills

## 2015-07-19 ENCOUNTER — Telehealth: Payer: Self-pay

## 2015-07-19 NOTE — Telephone Encounter (Signed)
Patient was seen in office and needs a referral as soon as possible to [facility] for Colon Cancer that has metastasized to liver. Notes are in chart for oncology and surgery. Please refer urgently.

## 2015-07-21 ENCOUNTER — Other Ambulatory Visit: Payer: Self-pay | Admitting: Hematology and Oncology

## 2015-07-22 ENCOUNTER — Inpatient Hospital Stay: Payer: Medicare Other | Attending: Hematology and Oncology | Admitting: Hematology and Oncology

## 2015-07-22 ENCOUNTER — Inpatient Hospital Stay: Payer: Medicare Other

## 2015-07-22 ENCOUNTER — Encounter: Payer: Self-pay | Admitting: Hematology and Oncology

## 2015-07-22 VITALS — BP 135/87 | HR 60 | Temp 95.0°F | Resp 18 | Wt 148.4 lb

## 2015-07-22 DIAGNOSIS — C189 Malignant neoplasm of colon, unspecified: Secondary | ICD-10-CM

## 2015-07-22 DIAGNOSIS — Z5111 Encounter for antineoplastic chemotherapy: Secondary | ICD-10-CM | POA: Diagnosis not present

## 2015-07-22 DIAGNOSIS — C787 Secondary malignant neoplasm of liver and intrahepatic bile duct: Secondary | ICD-10-CM | POA: Diagnosis not present

## 2015-07-22 DIAGNOSIS — Z87891 Personal history of nicotine dependence: Secondary | ICD-10-CM | POA: Diagnosis not present

## 2015-07-22 DIAGNOSIS — Z7982 Long term (current) use of aspirin: Secondary | ICD-10-CM | POA: Insufficient documentation

## 2015-07-22 DIAGNOSIS — E279 Disorder of adrenal gland, unspecified: Secondary | ICD-10-CM | POA: Diagnosis not present

## 2015-07-22 DIAGNOSIS — Z809 Family history of malignant neoplasm, unspecified: Secondary | ICD-10-CM | POA: Diagnosis not present

## 2015-07-22 DIAGNOSIS — G629 Polyneuropathy, unspecified: Secondary | ICD-10-CM

## 2015-07-22 DIAGNOSIS — Z79899 Other long term (current) drug therapy: Secondary | ICD-10-CM | POA: Insufficient documentation

## 2015-07-22 DIAGNOSIS — R634 Abnormal weight loss: Secondary | ICD-10-CM | POA: Diagnosis not present

## 2015-07-22 LAB — COMPREHENSIVE METABOLIC PANEL
ALT: 9 U/L — ABNORMAL LOW (ref 14–54)
AST: 19 U/L (ref 15–41)
Albumin: 3.5 g/dL (ref 3.5–5.0)
Alkaline Phosphatase: 95 U/L (ref 38–126)
Anion gap: 6 (ref 5–15)
BUN: 14 mg/dL (ref 6–20)
CO2: 24 mmol/L (ref 22–32)
Calcium: 9.7 mg/dL (ref 8.9–10.3)
Chloride: 107 mmol/L (ref 101–111)
Creatinine, Ser: 0.92 mg/dL (ref 0.44–1.00)
GFR calc Af Amer: >60 mL/min (ref >60)
GFR calc non Af Amer: >60 mL/min (ref >60)
Glucose, Bld: 146 mg/dL — ABNORMAL HIGH (ref 65–99)
Potassium: 3.9 mmol/L (ref 3.5–5.1)
Sodium: 137 mmol/L (ref 135–145)
Total Bilirubin: 0.9 mg/dL (ref 0.3–1.2)
Total Protein: 6.8 g/dL (ref 6.5–8.1)

## 2015-07-22 LAB — CBC WITH DIFFERENTIAL/PLATELET
Basophils Absolute: 0 10*3/uL (ref 0–0.1)
Basophils Relative: 0 %
Eosinophils Absolute: 0.5 10*3/uL (ref 0–0.7)
Eosinophils Relative: 4 %
HCT: 34 % — ABNORMAL LOW (ref 35.0–47.0)
Hemoglobin: 11.3 g/dL — ABNORMAL LOW (ref 12.0–16.0)
Lymphocytes Relative: 14 %
Lymphs Abs: 1.5 10*3/uL (ref 1.0–3.6)
MCH: 30.9 pg (ref 26.0–34.0)
MCHC: 33.3 g/dL (ref 32.0–36.0)
MCV: 92.8 fL (ref 80.0–100.0)
Monocytes Absolute: 0.8 10*3/uL (ref 0.2–0.9)
Monocytes Relative: 7 %
Neutro Abs: 8.1 10*3/uL — ABNORMAL HIGH (ref 1.4–6.5)
Neutrophils Relative %: 75 %
Platelets: 231 10*3/uL (ref 150–440)
RBC: 3.66 MIL/uL — ABNORMAL LOW (ref 3.80–5.20)
RDW: 16.8 % — ABNORMAL HIGH (ref 11.5–14.5)
WBC: 10.9 10*3/uL (ref 3.6–11.0)

## 2015-07-22 LAB — MAGNESIUM: Magnesium: 1.9 mg/dL (ref 1.7–2.4)

## 2015-07-22 MED ORDER — SODIUM CHLORIDE 0.9% FLUSH
10.0000 mL | INTRAVENOUS | Status: DC | PRN
  Administered 2015-07-22: 10 mL via INTRAVENOUS
  Filled 2015-07-22: qty 10

## 2015-07-22 MED ORDER — LEUCOVORIN CALCIUM INJECTION 100 MG
20.0000 mg/m2 | Freq: Once | INTRAMUSCULAR | Status: AC
Start: 2015-07-22 — End: 2015-07-22
  Administered 2015-07-22: 32 mg via INTRAVENOUS
  Filled 2015-07-22: qty 1.6

## 2015-07-22 MED ORDER — SODIUM CHLORIDE 0.9 % IV SOLN
Freq: Once | INTRAVENOUS | Status: AC
  Administered 2015-07-22: 11:00:00 via INTRAVENOUS
  Filled 2015-07-22: qty 4

## 2015-07-22 MED ORDER — FLUOROURACIL CHEMO INJECTION 2.5 GM/50ML
400.0000 mg/m2 | Freq: Once | INTRAVENOUS | Status: AC
  Administered 2015-07-22: 650 mg via INTRAVENOUS
  Filled 2015-07-22: qty 13

## 2015-07-22 MED ORDER — SODIUM CHLORIDE 0.9 % IJ SOLN
10.0000 mL | INTRAMUSCULAR | Status: DC | PRN
  Filled 2015-07-22: qty 10

## 2015-07-22 MED ORDER — DEXTROSE 5 % IV SOLN
Freq: Once | INTRAVENOUS | Status: AC
  Administered 2015-07-22: 11:00:00 via INTRAVENOUS
  Filled 2015-07-22: qty 1000

## 2015-07-22 MED ORDER — SODIUM CHLORIDE 0.9 % IV SOLN
2400.0000 mg/m2 | INTRAVENOUS | Status: DC
  Administered 2015-07-22: 3900 mg via INTRAVENOUS
  Filled 2015-07-22: qty 78

## 2015-07-22 MED ORDER — LEUCOVORIN CALCIUM INJECTION 350 MG: 650.0000 mg | Freq: Once | INTRAVENOUS | Status: DC

## 2015-07-22 MED ORDER — HEPARIN SOD (PORK) LOCK FLUSH 100 UNIT/ML IV SOLN
500.0000 [IU] | Freq: Once | INTRAVENOUS | Status: DC
  Filled 2015-07-22: qty 5

## 2015-07-22 MED ORDER — HEPARIN SOD (PORK) LOCK FLUSH 100 UNIT/ML IV SOLN: 500.0000 [IU] | Freq: Once | INTRAVENOUS | Status: DC | PRN

## 2015-07-22 NOTE — Progress Notes (Signed)
[facility]-  Cancer Center  Clinic day:  07/22/2015   Chief Complaint: Sandy [name] is a 69 y.o. female with stage IV colon cancer who is seen for assessment prior to cycle #5 5-fluorouracil and leucovorin chemotherapy.  HPI: The patient was last seen in the medical oncology clinic on 07/08/2015.  At that time, she had some issues with diarrhea secondary to taking too much Senna.  Her Senna was switched to every 3rd day.  She has had no further diarrhea.  She has normal bowel movements without diarrhea or constipation.  She denies any rectal bleeding.  She received cycle #4 5FU/LV uneventfully.  She was seen by Dr. Catherine Loflin on 07/16/2015.  Discussions were held regarding resection of her residual colon mass and 2 residual liver lesions (resection +/- ablation).  She was offered surgery at [facility] or [facility].  She is interested in seeking surgical evaluation at [facility].  Symptomatically, she feels good.  She notes residual neuropathy, slightly improved.  She continues Neurontin 100 mg during the day and 200 mg at night.  The night time dose makes her sleepy.  Past Medical History  Diagnosis Date  . Hypertension   . Arthritis   . Constipation   . Insomnia   . Diabetes mellitus without complication (HCC)   . Cancer (HCC)     liver mets; colon cancer    Past Surgical History  Procedure Laterality Date  . Colonoscopy with propofol N/A 09/07/2014    Procedure: COLONOSCOPY WITH PROPOFOL;  Surgeon: Darren Wohl, MD;  Location: [facility] ENDOSCOPY;  Service: Endoscopy;  Laterality: N/A;  . Esophagogastroduodenoscopy N/A 09/07/2014    Procedure: ESOPHAGOGASTRODUODENOSCOPY (EGD);  Surgeon: Darren Wohl, MD;  Location: [facility] ENDOSCOPY;  Service: Endoscopy;  Laterality: N/A;  . Portacath placement Left 09/24/2014    Procedure: INSERTION PORT-A-CATH;  Surgeon: Jeffrey W Byrnett, MD;  Location: [facility] ORS;  Service: General;  Laterality: Left;  . Liver biopsy      Family History   Problem Relation Age of Onset  . Cancer Sister     Social History:  reports that she quit smoking about 12 years ago. Her smoking use included Cigarettes. She has a 5 pack-year smoking history. She has never used smokeless tobacco. She reports that she does not drink alcohol or use illicit drugs.  The patient is accompanied by her daughter.   Allergies: No Known Allergies  Current Medications: Current Outpatient Prescriptions  Medication Sig Dispense Refill  . amLODipine (NORVASC) 5 MG tablet Take 5 mg by mouth daily.    . aspirin EC 81 MG tablet Take 81 mg by mouth daily.    . atorvastatin (LIPITOR) 40 MG tablet Take 40 mg by mouth daily.    . gabapentin (NEURONTIN) 100 MG capsule Take 2 capsules (200 mg total) by mouth at bedtime. 1 tab daily and 2 tabs at bedtime. 60 capsule 0  . lidocaine-prilocaine (EMLA) cream Apply 1 application topically as needed. Apply to port 1 hour prior to chemotherapy appointment. Cover with plastic wrap. 30 g 1  . lisinopril-hydrochlorothiazide (PRINZIDE,ZESTORETIC) 20-25 MG per tablet Take 1 tablet by mouth daily.    . magnesium 30 MG tablet Take 30 mg by mouth daily.     . megestrol (MEGACE) 400 MG/10ML suspension Take 5 mLs (200 mg total) by mouth daily. to stimulate appetite 240 mL 0  . metFORMIN (GLUCOPHAGE) 1000 MG tablet Take 1,000 mg by mouth 2 (two) times daily.     . metoprolol succinate (TOPROL-XL) 25   MG 24 hr tablet Take 25 mg by mouth daily.    . Multiple Vitamin (MULTIVITAMIN WITH MINERALS) TABS tablet Take 1 tablet by mouth daily.    . ondansetron (ZOFRAN) 4 MG tablet Take 1 tablet (4 mg total) by mouth every 8 (eight) hours as needed for nausea or vomiting. 30 tablet 2  . oxyCODONE-acetaminophen (PERCOCET/ROXICET) 5-325 MG tablet Take 1 tablet by mouth every 6 (six) hours as needed for severe pain. 30 tablet 0  . potassium chloride (K-DUR,KLOR-CON) 10 MEQ tablet Take 1 tablet (10 mEq total) by mouth once. Take 1 pill twice a day for 3 days  then 1 pill a day. (Patient taking differently: Take 10 mEq by mouth once. ) 30 tablet 0  . sorbitol 70 % solution Take 30 mLs by mouth daily as needed.     No current facility-administered medications for this visit.   Facility-Administered Medications Ordered in Other Visits  Medication Dose Route Frequency Provider Last Rate Last Dose  . acetaminophen (TYLENOL) tablet 650 mg  650 mg Oral Once Melissa C Corcoran, MD   650 mg at 10/19/14 0922  . fluorouracil (ADRUCIL) 3,900 mg in sodium chloride 0.9 % 72 mL chemo infusion  2,400 mg/m2 (Treatment Plan Actual) Intravenous 1 day or 1 dose Melissa C Corcoran, MD   3,900 mg at 07/22/15 1150  . heparin lock flush 100 unit/mL  500 Units Intravenous Once Melissa C Corcoran, MD   500 Units at 07/22/15 1053  . heparin lock flush 100 unit/mL  500 Units Intracatheter Once PRN Melissa C Corcoran, MD   500 Units at 07/22/15 1208  . sodium chloride 0.9 % injection 10 mL  10 mL Intracatheter PRN Melissa C Corcoran, MD   10 mL at 10/11/14 1431  . sodium chloride 0.9 % injection 10 mL  10 mL Intracatheter PRN Melissa C Corcoran, MD   10 mL at 02/28/15 1529  . sodium chloride 0.9 % injection 10 mL  10 mL Intracatheter PRN Melissa C Corcoran, MD   10 mL at 07/22/15 1209  . sodium chloride flush (NS) 0.9 % injection 10 mL  10 mL Intravenous PRN Melissa C Corcoran, MD   10 mL at 07/22/15 1053    Review of Systems:  GENERAL: Feels good.  No fevers or sweats.  Weight up 6 pounds. PERFORMANCE STATUS (ECOG): 2. HEENT: Wears a hearing aide. No visual changes, runny nose, sore throat, mouth sores or tenderness. Lungs: No shortness of breath or cough. No hemoptysis. Cardiac: No chest pain, palpitations, orthopnea, or PND. GI:  Eating well.  No nausea, vomiting, diarrhea,constipation, melena or hematochezia. GU: Wears a Depends pad.  No dysuria or hematuria. Musculoskeletal: Denies back pain. No joint pain. No muscle tenderness. Extremities: No pain or  swelling. Skin: No rashes, skin lesions, or ulcers Neuro:  Neuropathy in fingers and bottom of feeet (slightly improved).  No headache, numbness or weakness, or balance issues. Endocrine: Diabetes.  No thyroid issues, hot flashes or night sweats. Psych: No mood changes, depression or anxiety. Pain: Takes oxycodone every other day. Review of systems: All other systems reviewed and found to be negative.  Physical Exam: Blood pressure 135/87, pulse 60, temperature 95 F (35 C), temperature source Tympanic, resp. rate 18, weight 148 lb 5.9 oz (67.3 kg). GENERAL: Elderly woman sitting comfortably in a wheelchair in the exam room in no acute distress. MENTAL STATUS: Alert and oriented to person, place and time. HEAD: Styled curly brown hair. Normocephalic, atraumatic, face symmetric, no Cushingoid   features. EYES: Black rimmed glasses. Hazel/green eyes. Pupils equal round and reactive to light and accomodation. No conjunctivitis or scleral icterus. ENT: Oropharynx clear without lesion. Partial. Tongue normal. Mucous membranes moist.  RESPIRATORY: Clear to auscultation without rales, wheezes or rhonchi. CARDIOVASCULAR: Regular rate and rhythm without murmur, rub or gallop. ABDOMEN: Soft, non-tender with active bowel sounds. No guarding or rebound tenderness. No hepatosplenomegaly. SKIN: No rashes, ulcer or skin lesions.  EXTREMITIES: No edema, no skin discoloration or tenderness. No palpable cords. LYMPH NODES: Right low anterior cervical 2 cm soft node.  No palpablesupraclavicular, axillary or inguinal adenopathy  NEUROLOGICAL: Unremarkable.  Gait slightly unsteady. PSYCH: Appropriate.  Infusion on 07/22/2015  Component Date Value Ref Range Status  . WBC 07/22/2015 10.9  3.6 - 11.0 K/uL Final  . RBC 07/22/2015 3.66* 3.80 - 5.20 MIL/uL Final  . Hemoglobin 07/22/2015 11.3* 12.0 - 16.0 g/dL Final  . HCT 07/22/2015 34.0* 35.0 - 47.0 % Final  . MCV 07/22/2015 92.8  80.0 -  100.0 fL Final  . MCH 07/22/2015 30.9  26.0 - 34.0 pg Final  . MCHC 07/22/2015 33.3  32.0 - 36.0 g/dL Final  . RDW 07/22/2015 16.8* 11.5 - 14.5 % Final  . Platelets 07/22/2015 231  150 - 440 K/uL Final  . Neutrophils Relative % 07/22/2015 75   Final  . Neutro Abs 07/22/2015 8.1* 1.4 - 6.5 K/uL Final  . Lymphocytes Relative 07/22/2015 14   Final  . Lymphs Abs 07/22/2015 1.5  1.0 - 3.6 K/uL Final  . Monocytes Relative 07/22/2015 7   Final  . Monocytes Absolute 07/22/2015 0.8  0.2 - 0.9 K/uL Final  . Eosinophils Relative 07/22/2015 4   Final  . Eosinophils Absolute 07/22/2015 0.5  0 - 0.7 K/uL Final  . Basophils Relative 07/22/2015 0   Final  . Basophils Absolute 07/22/2015 0.0  0 - 0.1 K/uL Final  . Sodium 07/22/2015 137  135 - 145 mmol/L Final  . Potassium 07/22/2015 3.9  3.5 - 5.1 mmol/L Final  . Chloride 07/22/2015 107  101 - 111 mmol/L Final  . CO2 07/22/2015 24  22 - 32 mmol/L Final  . Glucose, Bld 07/22/2015 146* 65 - 99 mg/dL Final  . BUN 07/22/2015 14  6 - 20 mg/dL Final  . Creatinine, Ser 07/22/2015 0.92  0.44 - 1.00 mg/dL Final  . Calcium 07/22/2015 9.7  8.9 - 10.3 mg/dL Final  . Total Protein 07/22/2015 6.8  6.5 - 8.1 g/dL Final  . Albumin 07/22/2015 3.5  3.5 - 5.0 g/dL Final  . AST 07/22/2015 19  15 - 41 U/L Final  . ALT 07/22/2015 9* 14 - 54 U/L Final  . Alkaline Phosphatase 07/22/2015 95  38 - 126 U/L Final  . Total Bilirubin 07/22/2015 0.9  0.3 - 1.2 mg/dL Final  . GFR calc non Af Amer 07/22/2015 >60  >60 mL/min Final  . GFR calc Af Amer 07/22/2015 >60  >60 mL/min Final   Comment: (NOTE) The eGFR has been calculated using the CKD EPI equation. This calculation has not been validated in all clinical situations. eGFR's persistently <60 mL/min signify possible Chronic Kidney Disease.   . Anion gap 07/22/2015 6  5 - 15 Final  . Magnesium 07/22/2015 1.9  1.7 - 2.4 mg/dL Final    Assessment:  Sandy [name] is a 69 y.o. female with metastatic colon cancer. She  presented with a 53 pound weight loss in 6 months. Colonoscopy on 09/07/2014 revealed a circumferential partially obstructive fungating mass   in the descending colon. There was no active bleeding. There was a 1.5 cm pedunculated polyp in the sigmoid colon (tubulovillous adenoma). Pathology from the descending colon mass revealed fragments of adenocarcinoma. There was not enough tissue for KRAS testing.  CEA was 11,793 on 09/05/2014.  Chest CT angiogram on 08/28/2014 revealed no evidence of pulmonary embolism, but multiple large enhancing liver masses (largest 6.4 cm).  PET scan on 10/04/2014 revealed 6.5 x 6.1 cm hypermetabolic mass in the proximal sigmoid colon,  There were  numerous large hypermetabolic hepatic metastases. There were no additional sites of metastatic disease in the neck, chest or skeleton.  In addition, there was a 3.2 x 2.8 cm low-attenuation right thyroid nodule.   She is status post 14 cycles of FOLFOX chemotherapy (10/09/2014 - 04/11/2015).  She declined Avastin.  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  CEA was 10,169 on 10/09/2014 , 12,034 on 0712/2016, 7765 on 11/20/2014, 5154 on 12/04/2014, 1921 on 01/01/2015, 1266 on 01/15/2015, 770.6 on 01/29/2015, 502.7 on 02/12/2015, 368.7 on 02/26/2015, 269 on 03/14/2015, 178.3 on 03/28/2015, 135.9 on 04/11/2015, 116.1 on 05/13/2015, 90.8 on 06/10/2015, and 145.9 on 07/08/2015.  Abdominal and pelvic CT scan on 12/28/2014 revealed response to therapy.  Liver metastases and the descending/sigmoid mass were smaller.  There was no new disease.  Abdomen and pelvic CT scan on 03/25/2015 revealed interval decrease in size of hepatic metastasis and distal descending colon mass.   She is status post 5 cycles of 5-fluorouracil and leucovorin (04/28/2014 - 07/08/2015).  Cycle #4 was held on 06/10/2015 secondary to rectal bleeding.  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  She is on Neurontin 200 mg po qhs and  100 mg during the day.  The evening dose makes her sleepy.  Symptomatically, she feels good.  Exam is stable.  Plan: 1.  Labs today:  CBC with diff, CMP, Mg. 2.  Cycle #6 5FU/LV today. 3.  RTC in 2 days for disconnect. 4.  Patient to follow-up with [facility] surgery re: colon and liver resection. 5.  Continue current dose of Neurontin.  No increase in dose secondary to fatigue.   7.  RTC in 2 weeks for MD assess, labs (CBC with diff, CMP, Mg, CEA), and cycle #7 5FU/LV.   Lequita Asal, MD  07/22/2015, 12:29 PM

## 2015-07-22 NOTE — Telephone Encounter (Signed)
I have called [facility] @ [phone] and [phone] and left a detailed message on voicemail to be called back to refer this patient. I will wait till the morning to call back if [facility] has not called back before the end of the business day.

## 2015-07-23 NOTE — Telephone Encounter (Signed)
I have spoke with [name] with [facility] surgical oncology. She stated that she will have the provider review the chart to see if she will need a repeat of scans. After this is discussed she will call the patient and myself back with the appointment information. [phone].

## 2015-07-23 NOTE — Telephone Encounter (Signed)
[name] from [facility] has called and stated that she has contacted the patient and the daughter, [name]. Patient has a MRI scheduled for 08/01/15 and a consult with the surgeon on 08/06/15 -- HJ. Maudie Mercury, MD.

## 2015-07-24 ENCOUNTER — Inpatient Hospital Stay: Payer: Medicare Other

## 2015-07-24 VITALS — BP 133/83 | HR 56 | Temp 95.3°F | Resp 20

## 2015-07-24 DIAGNOSIS — C787 Secondary malignant neoplasm of liver and intrahepatic bile duct: Principal | ICD-10-CM

## 2015-07-24 DIAGNOSIS — Z5111 Encounter for antineoplastic chemotherapy: Secondary | ICD-10-CM | POA: Diagnosis not present

## 2015-07-24 DIAGNOSIS — C189 Malignant neoplasm of colon, unspecified: Secondary | ICD-10-CM

## 2015-07-24 MED ORDER — HEPARIN SOD (PORK) LOCK FLUSH 100 UNIT/ML IV SOLN
500.0000 [IU] | Freq: Once | INTRAVENOUS | Status: AC | PRN
  Administered 2015-07-24: 500 [IU]

## 2015-07-24 MED ORDER — SODIUM CHLORIDE 0.9 % IJ SOLN
10.0000 mL | INTRAMUSCULAR | Status: DC | PRN
  Administered 2015-07-24: 10 mL
  Filled 2015-07-24: qty 10

## 2015-08-01 ENCOUNTER — Other Ambulatory Visit: Payer: Self-pay | Admitting: *Deleted

## 2015-08-01 MED ORDER — OXYCODONE-ACETAMINOPHEN 5-325 MG PO TABS: 1.0000 | ORAL_TABLET | Freq: Four times a day (QID) | ORAL | Status: DC | PRN

## 2015-08-04 ENCOUNTER — Other Ambulatory Visit: Payer: Self-pay | Admitting: Hematology and Oncology

## 2015-08-05 ENCOUNTER — Inpatient Hospital Stay (HOSPITAL_BASED_OUTPATIENT_CLINIC_OR_DEPARTMENT_OTHER): Payer: Medicare Other | Admitting: Hematology and Oncology

## 2015-08-05 ENCOUNTER — Inpatient Hospital Stay: Payer: Medicare Other

## 2015-08-05 VITALS — BP 125/85 | HR 58 | Temp 95.0°F | Resp 18 | Wt 147.5 lb

## 2015-08-05 DIAGNOSIS — C189 Malignant neoplasm of colon, unspecified: Secondary | ICD-10-CM | POA: Diagnosis not present

## 2015-08-05 DIAGNOSIS — E279 Disorder of adrenal gland, unspecified: Secondary | ICD-10-CM | POA: Diagnosis not present

## 2015-08-05 DIAGNOSIS — Z809 Family history of malignant neoplasm, unspecified: Secondary | ICD-10-CM

## 2015-08-05 DIAGNOSIS — G629 Polyneuropathy, unspecified: Secondary | ICD-10-CM

## 2015-08-05 DIAGNOSIS — Z5111 Encounter for antineoplastic chemotherapy: Secondary | ICD-10-CM | POA: Diagnosis not present

## 2015-08-05 DIAGNOSIS — Z79899 Other long term (current) drug therapy: Secondary | ICD-10-CM

## 2015-08-05 DIAGNOSIS — R634 Abnormal weight loss: Secondary | ICD-10-CM

## 2015-08-05 DIAGNOSIS — C787 Secondary malignant neoplasm of liver and intrahepatic bile duct: Secondary | ICD-10-CM

## 2015-08-05 DIAGNOSIS — Z7982 Long term (current) use of aspirin: Secondary | ICD-10-CM

## 2015-08-05 DIAGNOSIS — Z87891 Personal history of nicotine dependence: Secondary | ICD-10-CM

## 2015-08-05 DIAGNOSIS — C801 Malignant (primary) neoplasm, unspecified: Secondary | ICD-10-CM

## 2015-08-05 LAB — COMPREHENSIVE METABOLIC PANEL
ALT: 12 U/L — ABNORMAL LOW (ref 14–54)
AST: 25 U/L (ref 15–41)
Albumin: 3.5 g/dL (ref 3.5–5.0)
Alkaline Phosphatase: 95 U/L (ref 38–126)
Anion gap: 7 (ref 5–15)
BUN: 11 mg/dL (ref 6–20)
CO2: 24 mmol/L (ref 22–32)
Calcium: 9.9 mg/dL (ref 8.9–10.3)
Chloride: 109 mmol/L (ref 101–111)
Creatinine, Ser: 0.98 mg/dL (ref 0.44–1.00)
GFR calc Af Amer: >60 mL/min (ref >60)
GFR calc non Af Amer: 57 mL/min — ABNORMAL LOW (ref >60)
Glucose, Bld: 162 mg/dL — ABNORMAL HIGH (ref 65–99)
Potassium: 3.8 mmol/L (ref 3.5–5.1)
Sodium: 140 mmol/L (ref 135–145)
Total Bilirubin: 1.2 mg/dL (ref 0.3–1.2)
Total Protein: 7 g/dL (ref 6.5–8.1)

## 2015-08-05 LAB — CBC WITH DIFFERENTIAL/PLATELET
Basophils Absolute: 0 10*3/uL (ref 0–0.1)
Basophils Relative: 0 %
Eosinophils Absolute: 0.2 10*3/uL (ref 0–0.7)
Eosinophils Relative: 2 %
HCT: 34.6 % — ABNORMAL LOW (ref 35.0–47.0)
Hemoglobin: 11.5 g/dL — ABNORMAL LOW (ref 12.0–16.0)
Lymphocytes Relative: 14 %
Lymphs Abs: 1.3 10*3/uL (ref 1.0–3.6)
MCH: 30.7 pg (ref 26.0–34.0)
MCHC: 33.3 g/dL (ref 32.0–36.0)
MCV: 92.3 fL (ref 80.0–100.0)
Monocytes Absolute: 0.6 10*3/uL (ref 0.2–0.9)
Monocytes Relative: 6 %
Neutro Abs: 7.1 10*3/uL — ABNORMAL HIGH (ref 1.4–6.5)
Neutrophils Relative %: 78 %
Platelets: 235 10*3/uL (ref 150–440)
RBC: 3.75 MIL/uL — ABNORMAL LOW (ref 3.80–5.20)
RDW: 16.1 % — ABNORMAL HIGH (ref 11.5–14.5)
WBC: 9.2 10*3/uL (ref 3.6–11.0)

## 2015-08-05 LAB — MAGNESIUM: Magnesium: 2 mg/dL (ref 1.7–2.4)

## 2015-08-05 MED ORDER — LEUCOVORIN CALCIUM INJECTION 100 MG
20.0000 mg/m2 | Freq: Once | INTRAMUSCULAR | Status: AC
  Administered 2015-08-05: 32 mg via INTRAVENOUS
  Filled 2015-08-05: qty 1.6

## 2015-08-05 MED ORDER — LEUCOVORIN CALCIUM INJECTION 350 MG: 650.0000 mg | Freq: Once | INTRAVENOUS | Status: DC

## 2015-08-05 MED ORDER — DEXTROSE 5 % IV SOLN
Freq: Once | INTRAVENOUS | Status: DC
  Filled 2015-08-05: qty 1000

## 2015-08-05 MED ORDER — FLUOROURACIL CHEMO INJECTION 5 GM/100ML
2400.0000 mg/m2 | INTRAVENOUS | Status: DC
  Administered 2015-08-05: 3900 mg via INTRAVENOUS
  Filled 2015-08-05: qty 78

## 2015-08-05 MED ORDER — SODIUM CHLORIDE 0.9 % IV SOLN
INTRAVENOUS | Status: DC
  Administered 2015-08-05: 10:00:00 via INTRAVENOUS
  Filled 2015-08-05: qty 1000

## 2015-08-05 MED ORDER — FLUOROURACIL CHEMO INJECTION 2.5 GM/50ML
400.0000 mg/m2 | Freq: Once | INTRAVENOUS | Status: AC
  Administered 2015-08-05: 650 mg via INTRAVENOUS
  Filled 2015-08-05: qty 13

## 2015-08-05 MED ORDER — SODIUM CHLORIDE 0.9% FLUSH
10.0000 mL | INTRAVENOUS | Status: DC | PRN
  Administered 2015-08-05: 10 mL via INTRAVENOUS
  Filled 2015-08-05: qty 10

## 2015-08-05 MED ORDER — SODIUM CHLORIDE 0.9 % IV SOLN
Freq: Once | INTRAVENOUS | Status: AC
  Administered 2015-08-05: 11:00:00 via INTRAVENOUS
  Filled 2015-08-05: qty 4

## 2015-08-05 NOTE — Progress Notes (Signed)
States has occasional abd pains that comes and goes throughout the day. Having nasal drainage that is clear in color. Unable to sleep at night and states cannot take medication for insomnia due to taking medication for numbness/tingling. Continues to have constipation but states is taking medication to help relieve constipation.

## 2015-08-05 NOTE — Progress Notes (Signed)
[place] [facility] day:  08/05/2015   Chief Complaint: Sandy [name] is a 70 y.o. female with stage IV colon cancer who is seen for assessment prior to cycle #7 5-fluorouracil and leucovorin chemotherapy.  HPI: The patient was last seen in the medical oncology clinic on 07/22/2015.  At that time, she felt good.  Her neuropathy was slightly improved.  She received cycle #6 5FU/LV.  She was to follow-up with [facility] surgery regarding colon and liver resection.  She has an appointment with [facility] on 08/06/2015.  In preparation, she underwent abdominal and pelvic MRI.  She underwent abdominal and pelvic MRI on 08/01/2015.  Imaging revealed multiple hypoenhancing hepatic metastasis in both lobes of the liver (3.3 x 2.2 cm in segment VII, 5.9 x 4.1 cm in segment VIII, and a 2.4 x 1.7 cm caudate lesion).  There was a 4.3 cm length of focally narrowed sigmoid colon.  There was nodular thickening of the right adrenal gland.  Symptomatically, she notes occasional abdominal discomfort in her lower abdomen.  Constipation is well controlled.  She notes poor sleep.  She has a stable residual neuropathy.     Past Medical History  Diagnosis Date  . Hypertension   . Arthritis   . Constipation   . Insomnia   . Diabetes mellitus without complication ([place])   . Cancer [facility])     liver mets; colon cancer    Past Surgical History  Procedure Laterality Date  . Colonoscopy with propofol N/A 09/07/2014    Procedure: COLONOSCOPY WITH PROPOFOL;  Surgeon: Lucilla Lame, MD;  Location: [facility] ENDOSCOPY;  Service: Endoscopy;  Laterality: N/A;  . Esophagogastroduodenoscopy N/A 09/07/2014    Procedure: ESOPHAGOGASTRODUODENOSCOPY (EGD);  Surgeon: Lucilla Lame, MD;  Location[facility] ENDOSCOPY;  Service: Endoscopy;  Laterality: N/A;  . Portacath placement Left 09/24/2014    Procedure: INSERTION PORT-A-CATH;  Surgeon: Robert Bellow, MD;  Location: [facility] ORS;  Service: General;  Laterality:  Left;  . Liver biopsy      Family History  Problem Relation Age of Onset  . Cancer Sister     Social History:  reports that she quit smoking about 12 years ago. Her smoking use included Cigarettes. She has a 5 pack-year smoking history. She has never used smokeless tobacco. She reports that she does not drink alcohol or use illicit drugs.  The patient is accompanied by her daughter.   Allergies: No Known Allergies  Current Medications: Current Outpatient Prescriptions  Medication Sig Dispense Refill  . amLODipine (NORVASC) 5 MG tablet Take 5 mg by mouth daily.    Marland Kitchen aspirin EC 81 MG tablet Take 81 mg by mouth daily.    Marland Kitchen atorvastatin (LIPITOR) 40 MG tablet Take 40 mg by mouth daily.    Marland Kitchen gabapentin (NEURONTIN) 100 MG capsule Take 2 capsules (200 mg total) by mouth at bedtime. 1 tab daily and 2 tabs at bedtime. 60 capsule 0  . lidocaine-prilocaine (EMLA) cream Apply 1 application topically as needed. Apply to port 1 hour prior to chemotherapy appointment. Cover with plastic wrap. 30 g 1  . lisinopril-hydrochlorothiazide (PRINZIDE,ZESTORETIC) 20-25 MG per tablet Take 1 tablet by mouth daily.    . magnesium 30 MG tablet Take 30 mg by mouth daily.     . megestrol (MEGACE) 400 MG/10ML suspension Take 5 mLs (200 mg total) by mouth daily. to stimulate appetite 240 mL 0  . metFORMIN (GLUCOPHAGE) 1000 MG tablet Take 1,000 mg by mouth 2 (two) times  daily.     . metoprolol succinate (TOPROL-XL) 25 MG 24 hr tablet Take 25 mg by mouth daily.    . Multiple Vitamin (MULTIVITAMIN WITH MINERALS) TABS tablet Take 1 tablet by mouth daily.    . ondansetron (ZOFRAN) 4 MG tablet Take 1 tablet (4 mg total) by mouth every 8 (eight) hours as needed for nausea or vomiting. 30 tablet 2  . oxyCODONE-acetaminophen (PERCOCET/ROXICET) 5-325 MG tablet Take 1 tablet by mouth every 6 (six) hours as needed for severe pain. 30 tablet 0  . potassium chloride (K-DUR,KLOR-CON) 10 MEQ tablet Take 1 tablet (10 mEq total) by  mouth once. Take 1 pill twice a day for 3 days then 1 pill a day. (Patient taking differently: Take 10 mEq by mouth once. ) 30 tablet 0  . sorbitol 70 % solution Take 30 mLs by mouth daily as needed.     No current facility-administered medications for this visit.   Facility-Administered Medications Ordered in Other Visits  Medication Dose Route Frequency Provider Last Rate Last Dose  . acetaminophen (TYLENOL) tablet 650 mg  650 mg Oral Once Lequita Asal, MD   650 mg at 10/19/14 3382  . sodium chloride 0.9 % injection 10 mL  10 mL Intracatheter PRN Lequita Asal, MD   10 mL at 10/11/14 1431  . sodium chloride 0.9 % injection 10 mL  10 mL Intracatheter PRN Lequita Asal, MD   10 mL at 02/28/15 1529  . sodium chloride flush (NS) 0.9 % injection 10 mL  10 mL Intravenous PRN Lequita Asal, MD   10 mL at 08/05/15 5053    Review of Systems:  GENERAL: Feels "the same".  No fevers or sweats.  Weight stable. PERFORMANCE STATUS (ECOG): 2. HEENT: Wears a hearing aide. No visual changes, runny nose, sore throat, mouth sores or tenderness. Lungs: No shortness of breath or cough. No hemoptysis. Cardiac: No chest pain, palpitations, orthopnea, or PND. GI:  Eating well.  Occasional lower abdominal pain.  Constipation, controlled.  No nausea, vomiting, diarrhea,melena or hematochezia. GU: Wears a Depends pad.  No dysuria or hematuria. Musculoskeletal: Denies back pain. No joint pain. No muscle tenderness. Extremities: No pain or swelling. Skin: No rashes, skin lesions, or ulcers Neuro:  Neuropathy in fingers and bottom of feeet (stable).  No headache, numbness or weakness, or balance issues. Endocrine: Diabetes.  No thyroid issues, hot flashes or night sweats. Psych: No mood changes, depression or anxiety. Pain: Takes oxycodone every other day. Review of systems: All other systems reviewed and found to be negative.  Physical Exam: Blood pressure 125/85, pulse 58,  temperature 95 F (35 C), temperature source Tympanic, resp. rate 18, weight 147 lb 7.8 oz (66.9 kg). GENERAL: Elderly woman sitting comfortably in a wheelchair in the exam room in no acute distress. MENTAL STATUS: Alert and oriented to person, place and time. HEAD: Styled curly brown hair. Normocephalic, atraumatic, face symmetric, no Cushingoid features. EYES: Black rimmed glasses. Hazel/green eyes. Pupils equal round and reactive to light and accomodation. No conjunctivitis or scleral icterus. ENT: Oropharynx clear without lesion. Partial. Tongue normal. Mucous membranes moist.  RESPIRATORY: Clear to auscultation without rales, wheezes or rhonchi. CARDIOVASCULAR: Regular rate and rhythm without murmur, rub or gallop. ABDOMEN: Soft, non-tender with active bowel sounds. No guarding or rebound tenderness. No hepatosplenomegaly. SKIN: No rashes, ulcer or skin lesions.  EXTREMITIES: No edema, no skin discoloration or tenderness. No palpable cords. LYMPH NODES: Right low anterior cervical 2 cm soft node.  No palpablesupraclavicular, axillary or inguinal adenopathy  NEUROLOGICAL: Unremarkable.  Gait slightly unsteady (stable). PSYCH: Appropriate.  Infusion on 08/05/2015  Component Date Value Ref Range Status  . WBC 08/05/2015 9.2  3.6 - 11.0 K/uL Final  . RBC 08/05/2015 3.75* 3.80 - 5.20 MIL/uL Final  . Hemoglobin 08/05/2015 11.5* 12.0 - 16.0 g/dL Final  . HCT 08/05/2015 34.6* 35.0 - 47.0 % Final  . MCV 08/05/2015 92.3  80.0 - 100.0 fL Final  . MCH 08/05/2015 30.7  26.0 - 34.0 pg Final  . MCHC 08/05/2015 33.3  32.0 - 36.0 g/dL Final  . RDW 08/05/2015 16.1* 11.5 - 14.5 % Final  . Platelets 08/05/2015 235  150 - 440 K/uL Final  . Neutrophils Relative % 08/05/2015 78   Final  . Neutro Abs 08/05/2015 7.1* 1.4 - 6.5 K/uL Final  . Lymphocytes Relative 08/05/2015 14   Final  . Lymphs Abs 08/05/2015 1.3  1.0 - 3.6 K/uL Final  . Monocytes Relative 08/05/2015 6   Final  .  Monocytes Absolute 08/05/2015 0.6  0.2 - 0.9 K/uL Final  . Eosinophils Relative 08/05/2015 2   Final  . Eosinophils Absolute 08/05/2015 0.2  0 - 0.7 K/uL Final  . Basophils Relative 08/05/2015 0   Final  . Basophils Absolute 08/05/2015 0.0  0 - 0.1 K/uL Final  . Sodium 08/05/2015 140  135 - 145 mmol/L Final  . Potassium 08/05/2015 3.8  3.5 - 5.1 mmol/L Final  . Chloride 08/05/2015 109  101 - 111 mmol/L Final  . CO2 08/05/2015 24  22 - 32 mmol/L Final  . Glucose, Bld 08/05/2015 162* 65 - 99 mg/dL Final  . BUN 08/05/2015 11  6 - 20 mg/dL Final  . Creatinine, Ser 08/05/2015 0.98  0.44 - 1.00 mg/dL Final  . Calcium 08/05/2015 9.9  8.9 - 10.3 mg/dL Final  . Total Protein 08/05/2015 7.0  6.5 - 8.1 g/dL Final  . Albumin 08/05/2015 3.5  3.5 - 5.0 g/dL Final  . AST 08/05/2015 25  15 - 41 U/L Final  . ALT 08/05/2015 12* 14 - 54 U/L Final  . Alkaline Phosphatase 08/05/2015 95  38 - 126 U/L Final  . Total Bilirubin 08/05/2015 1.2  0.3 - 1.2 mg/dL Final  . GFR calc non Af Amer 08/05/2015 57* >60 mL/min Final  . GFR calc Af Amer 08/05/2015 >60  >60 mL/min Final   Comment: (NOTE) The eGFR has been calculated using the CKD EPI equation. This calculation has not been validated in all clinical situations. eGFR's persistently <60 mL/min signify possible Chronic Kidney Disease.   . Anion gap 08/05/2015 7  5 - 15 Final  . Magnesium 08/05/2015 2.0  1.7 - 2.4 mg/dL Final    Assessment:  Sandy [name] is a 70 y.o. female with metastatic colon cancer. She presented with a 53 pound weight loss in 6 months. Colonoscopy on 09/07/2014 revealed a circumferential partially obstructive fungating mass in the descending colon. There was no active bleeding. There was a 1.5 cm pedunculated polyp in the sigmoid colon (tubulovillous adenoma). Pathology from the descending colon mass revealed fragments of adenocarcinoma. There was not enough tissue for KRAS testing.  CEA was 11,793 on 09/05/2014.  Chest CT  angiogram on 08/28/2014 revealed no evidence of pulmonary embolism, but multiple large enhancing liver masses (largest 6.4 cm).  PET scan on 10/04/2014 revealed 6.5 x 6.1 cm hypermetabolic mass in the proximal sigmoid colon,  There were  numerous large hypermetabolic hepatic metastases. There were no  additional sites of metastatic disease in the neck, chest or skeleton.  In addition, there was a 3.2 x 2.8 cm low-attenuation right thyroid nodule.   She is status post 14 cycles of FOLFOX chemotherapy (10/09/2014 - 04/11/2015).  She declined Avastin.  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  CEA was 10,169 on 10/09/2014 , 12,034 on 0712/2016, 7765 on 11/20/2014, 5154 on 12/04/2014, 1921 on 01/01/2015, 1266 on 01/15/2015, 770.6 on 01/29/2015, 502.7 on 02/12/2015, 368.7 on 02/26/2015, 269 on 03/14/2015, 178.3 on 03/28/2015, 135.9 on 04/11/2015, 116.1 on 05/13/2015, 90.8 on 06/10/2015, and 145.9 on 07/08/2015.  Abdominal and pelvic CT scan on 12/28/2014 revealed response to therapy.  Liver metastases and the descending/sigmoid mass were smaller.  There was no new disease.  Abdomen and pelvic CT scan on 03/25/2015 revealed interval decrease in size of hepatic metastasis and distal descending colon mass.   Abdominal and pelvic CT scan on 03/25/2015 revealed decreasing hepatic metastasis and distal descending colon mass.  Right hepatic lobe lesion was 2.2 x 2.1 cm.  Left lateral hepatic lobe lesion was 1.9 x 1.5 cm.  There was stable thickening of the adrenal glands.  She is status post 6 cycles of 5-fluorouracil and leucovorin (04/28/2014 - 07/22/2015).  Cycle #4 was held on 06/10/2015 secondary to rectal bleeding.  Abdominal and pelvic MRI on 08/01/2015 revealed multiple hypoenhancing hepatic metastasis in both lobes of the liver (3.3 x 2.2 cm in segment VII, 5.9 x 4.1 cm in segment VIII, and a 2.4 x 1.7 cm caudate lesion).  There was a 4.3 cm length of focally narrowed sigmoid colon.   There was nodular thickening of the right adrenal gland.  Symptomatically, she has occasional lower abdominal pain.  Constipation is well managed.  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  She is on Neurontin.  Plan: 1.  Labs today:  CBC with diff, CMP, Mg. 2.  Cycle #7 5FU/LV today. 3.  RTC in 2 days for disconnect. 4.  Discuss abdominal and pelvic MRI.  Suspect patient is not a surgical candidate.  Await follow-up with [facility] surgery tomorrow. 5.  Discuss CEA, increased at last visit.  Await follow-up today.  Discuss potential need to change chemotherapy (FOLFIRI).  Side effects reviewed.  Discuss obtaining tissue for KRAS for potential use of Cetuximab.   6.  RTC in 2 weeks for MD assess, labs (CBC with diff, CMP, Mg, CEA), and cycle #8 5FU/LV.  Addendum:  After clinic, CEA returned 300.7.  Will obtain PET scan for new baseline.   Lequita Asal, MD  08/05/2015, 9:52 AM

## 2015-08-06 LAB — CEA: CEA: 300.7 ng/mL — ABNORMAL HIGH (ref 0.0–4.7)

## 2015-08-07 ENCOUNTER — Encounter: Payer: Self-pay | Admitting: Hematology and Oncology

## 2015-08-07 ENCOUNTER — Inpatient Hospital Stay: Payer: Medicare Other

## 2015-08-07 ENCOUNTER — Other Ambulatory Visit: Payer: Self-pay | Admitting: Hematology and Oncology

## 2015-08-07 VITALS — BP 139/89 | HR 52 | Temp 97.0°F | Resp 18

## 2015-08-07 DIAGNOSIS — C189 Malignant neoplasm of colon, unspecified: Secondary | ICD-10-CM

## 2015-08-07 DIAGNOSIS — C787 Secondary malignant neoplasm of liver and intrahepatic bile duct: Principal | ICD-10-CM

## 2015-08-07 DIAGNOSIS — Z5111 Encounter for antineoplastic chemotherapy: Secondary | ICD-10-CM | POA: Diagnosis not present

## 2015-08-07 MED ORDER — SODIUM CHLORIDE 0.9 % IJ SOLN
10.0000 mL | INTRAMUSCULAR | Status: DC | PRN
  Administered 2015-08-07: 10 mL
  Filled 2015-08-07: qty 10

## 2015-08-07 MED ORDER — HEPARIN SOD (PORK) LOCK FLUSH 100 UNIT/ML IV SOLN
INTRAVENOUS | Status: AC
  Filled 2015-08-07: qty 5

## 2015-08-07 MED ORDER — HEPARIN SOD (PORK) LOCK FLUSH 100 UNIT/ML IV SOLN
500.0000 [IU] | Freq: Once | INTRAVENOUS | Status: AC | PRN
  Administered 2015-08-07: 500 [IU]

## 2015-08-08 ENCOUNTER — Telehealth: Payer: Self-pay | Admitting: *Deleted

## 2015-08-08 NOTE — Telephone Encounter (Signed)
I called and spoke to daughter and told her about cea and her mother was taking a nap.  I told her that it is screening tool but it could indicate that cancer is growing.  She was sad but did want pet to be done.  I told her the order has been entered and Lattie Haw will schedule it and call her with date and time and instructions.  She states they did see Dr. Almyra Free and he does not feel she is a surgical candidate

## 2015-08-16 ENCOUNTER — Encounter
Admission: RE | Admit: 2015-08-16 | Discharge: 2015-08-16 | Disposition: A | Discharge status: Home / Self Care | Payer: Medicare Other | Source: Ambulatory Visit | Attending: Hematology and Oncology | Admitting: Hematology and Oncology

## 2015-08-16 DIAGNOSIS — C189 Malignant neoplasm of colon, unspecified: Secondary | ICD-10-CM

## 2015-08-16 DIAGNOSIS — C787 Secondary malignant neoplasm of liver and intrahepatic bile duct: Secondary | ICD-10-CM | POA: Insufficient documentation

## 2015-08-18 ENCOUNTER — Other Ambulatory Visit: Payer: Self-pay | Admitting: Hematology and Oncology

## 2015-08-19 ENCOUNTER — Inpatient Hospital Stay: Payer: Medicare Other

## 2015-08-19 ENCOUNTER — Inpatient Hospital Stay (HOSPITAL_BASED_OUTPATIENT_CLINIC_OR_DEPARTMENT_OTHER): Payer: Medicare Other | Admitting: Hematology and Oncology

## 2015-08-19 ENCOUNTER — Inpatient Hospital Stay: Payer: Medicare Other | Attending: Hematology and Oncology

## 2015-08-19 ENCOUNTER — Encounter: Payer: Self-pay | Admitting: Hematology and Oncology

## 2015-08-19 ENCOUNTER — Telehealth: Payer: Self-pay | Admitting: Gastroenterology

## 2015-08-19 ENCOUNTER — Other Ambulatory Visit: Payer: Self-pay

## 2015-08-19 VITALS — BP 127/88 | HR 61 | Temp 95.8°F | Resp 18 | Ht 65.0 in | Wt 146.9 lb

## 2015-08-19 DIAGNOSIS — Z9221 Personal history of antineoplastic chemotherapy: Secondary | ICD-10-CM | POA: Diagnosis not present

## 2015-08-19 DIAGNOSIS — R970 Elevated carcinoembryonic antigen [CEA]: Secondary | ICD-10-CM | POA: Insufficient documentation

## 2015-08-19 DIAGNOSIS — E041 Nontoxic single thyroid nodule: Secondary | ICD-10-CM

## 2015-08-19 DIAGNOSIS — C187 Malignant neoplasm of sigmoid colon: Secondary | ICD-10-CM | POA: Diagnosis not present

## 2015-08-19 DIAGNOSIS — Z7984 Long term (current) use of oral hypoglycemic drugs: Secondary | ICD-10-CM | POA: Insufficient documentation

## 2015-08-19 DIAGNOSIS — C186 Malignant neoplasm of descending colon: Secondary | ICD-10-CM

## 2015-08-19 DIAGNOSIS — C787 Secondary malignant neoplasm of liver and intrahepatic bile duct: Secondary | ICD-10-CM | POA: Diagnosis not present

## 2015-08-19 DIAGNOSIS — Z7982 Long term (current) use of aspirin: Secondary | ICD-10-CM

## 2015-08-19 DIAGNOSIS — G4700 Insomnia, unspecified: Secondary | ICD-10-CM | POA: Diagnosis not present

## 2015-08-19 DIAGNOSIS — C785 Secondary malignant neoplasm of large intestine and rectum: Secondary | ICD-10-CM | POA: Diagnosis not present

## 2015-08-19 DIAGNOSIS — R634 Abnormal weight loss: Secondary | ICD-10-CM

## 2015-08-19 DIAGNOSIS — Z87891 Personal history of nicotine dependence: Secondary | ICD-10-CM | POA: Diagnosis not present

## 2015-08-19 DIAGNOSIS — Z809 Family history of malignant neoplasm, unspecified: Secondary | ICD-10-CM | POA: Insufficient documentation

## 2015-08-19 DIAGNOSIS — E119 Type 2 diabetes mellitus without complications: Secondary | ICD-10-CM

## 2015-08-19 DIAGNOSIS — R5383 Other fatigue: Secondary | ICD-10-CM | POA: Insufficient documentation

## 2015-08-19 DIAGNOSIS — R109 Unspecified abdominal pain: Secondary | ICD-10-CM

## 2015-08-19 DIAGNOSIS — T451X5S Adverse effect of antineoplastic and immunosuppressive drugs, sequela: Secondary | ICD-10-CM

## 2015-08-19 DIAGNOSIS — M129 Arthropathy, unspecified: Secondary | ICD-10-CM | POA: Insufficient documentation

## 2015-08-19 DIAGNOSIS — G629 Polyneuropathy, unspecified: Secondary | ICD-10-CM

## 2015-08-19 DIAGNOSIS — Z79899 Other long term (current) drug therapy: Secondary | ICD-10-CM | POA: Insufficient documentation

## 2015-08-19 DIAGNOSIS — C189 Malignant neoplasm of colon, unspecified: Secondary | ICD-10-CM

## 2015-08-19 DIAGNOSIS — G893 Neoplasm related pain (acute) (chronic): Secondary | ICD-10-CM

## 2015-08-19 DIAGNOSIS — I10 Essential (primary) hypertension: Secondary | ICD-10-CM | POA: Insufficient documentation

## 2015-08-19 DIAGNOSIS — C801 Malignant (primary) neoplasm, unspecified: Secondary | ICD-10-CM

## 2015-08-19 LAB — COMPREHENSIVE METABOLIC PANEL
ALT: 9 U/L — ABNORMAL LOW (ref 14–54)
AST: 19 U/L (ref 15–41)
Albumin: 3.3 g/dL — ABNORMAL LOW (ref 3.5–5.0)
Alkaline Phosphatase: 96 U/L (ref 38–126)
Anion gap: 10 (ref 5–15)
BUN: 15 mg/dL (ref 6–20)
CO2: 22 mmol/L (ref 22–32)
Calcium: 10.2 mg/dL (ref 8.9–10.3)
Chloride: 109 mmol/L (ref 101–111)
Creatinine, Ser: 0.84 mg/dL (ref 0.44–1.00)
GFR calc Af Amer: >60 mL/min (ref >60)
GFR calc non Af Amer: >60 mL/min (ref >60)
Glucose, Bld: 118 mg/dL — ABNORMAL HIGH (ref 65–99)
Potassium: 4.1 mmol/L (ref 3.5–5.1)
Sodium: 141 mmol/L (ref 135–145)
Total Bilirubin: 0.9 mg/dL (ref 0.3–1.2)
Total Protein: 6.5 g/dL (ref 6.5–8.1)

## 2015-08-19 LAB — CBC WITH DIFFERENTIAL/PLATELET
Basophils Absolute: 0 10*3/uL (ref 0–0.1)
Basophils Relative: 0 %
Eosinophils Absolute: 0.3 10*3/uL (ref 0–0.7)
Eosinophils Relative: 3 %
HCT: 31.8 % — ABNORMAL LOW (ref 35.0–47.0)
Hemoglobin: 10.7 g/dL — ABNORMAL LOW (ref 12.0–16.0)
Lymphocytes Relative: 12 %
Lymphs Abs: 1.1 10*3/uL (ref 1.0–3.6)
MCH: 30.5 pg (ref 26.0–34.0)
MCHC: 33.7 g/dL (ref 32.0–36.0)
MCV: 90.4 fL (ref 80.0–100.0)
Monocytes Absolute: 0.6 10*3/uL (ref 0.2–0.9)
Monocytes Relative: 7 %
Neutro Abs: 7 10*3/uL — ABNORMAL HIGH (ref 1.4–6.5)
Neutrophils Relative %: 78 %
Platelets: 271 10*3/uL (ref 150–440)
RBC: 3.52 MIL/uL — ABNORMAL LOW (ref 3.80–5.20)
RDW: 16.2 % — ABNORMAL HIGH (ref 11.5–14.5)
WBC: 8.9 10*3/uL (ref 3.6–11.0)

## 2015-08-19 LAB — MAGNESIUM: Magnesium: 1.9 mg/dL (ref 1.7–2.4)

## 2015-08-19 MED ORDER — OXYCODONE-ACETAMINOPHEN 5-325 MG PO TABS: 1.0000 | ORAL_TABLET | Freq: Four times a day (QID) | ORAL | Status: DC | PRN

## 2015-08-19 MED ORDER — HEPARIN SOD (PORK) LOCK FLUSH 100 UNIT/ML IV SOLN
500.0000 [IU] | Freq: Once | INTRAVENOUS | Status: AC
Start: 2015-08-19 — End: 2015-08-19
  Administered 2015-08-19: 500 [IU] via INTRAVENOUS
  Filled 2015-08-19: qty 5

## 2015-08-19 NOTE — Telephone Encounter (Signed)
Patient needs a biopsy of her liver per her daughter

## 2015-08-19 NOTE — Progress Notes (Signed)
Patient will not receive treatment today per Dr. Mike Gip

## 2015-08-19 NOTE — Progress Notes (Signed)
[facility] day:  08/19/2015   Chief Complaint: Sandy [name] is a 70 y.o. female with stage IV colon cancer who is seen for assessment prior to cycle #8 5-fluorouracil and leucovorin chemotherapy.  HPI: The patient was last seen in the medical oncology clinic on 08/05/2015.  At that time, she received cycle #7 5-fluorouracil and leucovorin.  She noted occasional abdominal discomfort in her lower abdomen.  Constipation was well controlled.  She had a stable residual neuropathy.  We discussed her CEA.  She had met with [facility] surgery.  Preliminary decision was no surgery.  She was to be presented at tumor board.  During the interim, she denies any new complaints.  She is a little fatigued today.  Sometimes, she has pain around her waist.  She denies any constipation or diarrhea.  She is eating well.  She also drinks Ensure.   Past Medical History  Diagnosis Date  . Hypertension   . Arthritis   . Constipation   . Insomnia   . Diabetes mellitus without complication ([place])   . Cancer [facility])     liver mets; colon cancer    Past Surgical History  Procedure Laterality Date  . Colonoscopy with propofol N/A 09/07/2014    Procedure: COLONOSCOPY WITH PROPOFOL;  Surgeon: Lucilla Lame, MD;  Location: [facility] ENDOSCOPY;  Service: Endoscopy;  Laterality: N/A;  . Esophagogastroduodenoscopy N/A 09/07/2014    Procedure: ESOPHAGOGASTRODUODENOSCOPY (EGD);  Surgeon: Lucilla Lame, MD;  Location: [facility] ENDOSCOPY;  Service: Endoscopy;  Laterality: N/A;  . Portacath placement Left 09/24/2014    Procedure: INSERTION PORT-A-CATH;  Surgeon: Robert Bellow, MD;  Location: [facility] ORS;  Service: General;  Laterality: Left;  . Liver biopsy      Family History  Problem Relation Age of Onset  . Cancer Sister     Social History:  reports that she quit smoking about 12 years ago. Her smoking use included Cigarettes. She has a 5 pack-year smoking history. She has never used  smokeless tobacco. She reports that she does not drink alcohol or use illicit drugs.  The patient is accompanied by her daughter.   Allergies: No Known Allergies  Current Medications: Current Outpatient Prescriptions  Medication Sig Dispense Refill  . aspirin EC 81 MG tablet Take 81 mg by mouth daily.    Marland Kitchen atorvastatin (LIPITOR) 40 MG tablet Take 40 mg by mouth daily.    Marland Kitchen gabapentin (NEURONTIN) 100 MG capsule Take 2 capsules (200 mg total) by mouth at bedtime. 1 tab daily and 2 tabs at bedtime. 60 capsule 0  . lidocaine-prilocaine (EMLA) cream Apply 1 application topically as needed. Apply to port 1 hour prior to chemotherapy appointment. Cover with plastic wrap. 30 g 1  . lisinopril-hydrochlorothiazide (PRINZIDE,ZESTORETIC) 20-25 MG per tablet Take 1 tablet by mouth daily.    . magnesium 30 MG tablet Take 30 mg by mouth daily.     . megestrol (MEGACE) 400 MG/10ML suspension Take 5 mLs (200 mg total) by mouth daily. to stimulate appetite 240 mL 0  . metFORMIN (GLUCOPHAGE) 1000 MG tablet Take 1,000 mg by mouth 2 (two) times daily.     . metoprolol (LOPRESSOR) 100 MG tablet Take 100 mg by mouth.    . Multiple Vitamin (MULTIVITAMIN WITH MINERALS) TABS tablet Take 1 tablet by mouth daily.    . ondansetron (ZOFRAN) 4 MG tablet Take 1 tablet (4 mg total) by mouth every 8 (eight) hours as needed for nausea or vomiting.  30 tablet 2  . oxyCODONE-acetaminophen (PERCOCET/ROXICET) 5-325 MG tablet Take 1 tablet by mouth every 6 (six) hours as needed for severe pain. 30 tablet 0  . potassium chloride (K-DUR,KLOR-CON) 10 MEQ tablet Take 1 tablet (10 mEq total) by mouth once. Take 1 pill twice a day for 3 days then 1 pill a day. (Patient taking differently: Take 10 mEq by mouth once. ) 30 tablet 0  . sorbitol 70 % solution Take 30 mLs by mouth daily as needed.     No current facility-administered medications for this visit.   Facility-Administered Medications Ordered in Other Visits  Medication Dose Route  Frequency Provider Last Rate Last Dose  . acetaminophen (TYLENOL) tablet 650 mg  650 mg Oral Once Lequita Asal, MD   650 mg at 10/19/14 1308  . sodium chloride 0.9 % injection 10 mL  10 mL Intracatheter PRN Lequita Asal, MD   10 mL at 10/11/14 1431  . sodium chloride 0.9 % injection 10 mL  10 mL Intracatheter PRN Lequita Asal, MD   10 mL at 02/28/15 1529    Review of Systems:  GENERAL: Little fatigued.  No fevers or sweats.  Weight down 1 pound. PERFORMANCE STATUS (ECOG): 2. HEENT: Wears a hearing aide. No visual changes, runny nose, sore throat, mouth sores or tenderness. Lungs: No shortness of breath or cough. No hemoptysis. Cardiac: No chest pain, palpitations, orthopnea, or PND. GI:  Eating well.  Occasional abdominal pain.  No nausea, vomiting, diarrhea, constipation, melena or hematochezia. GU: Wears a Depends pad.  No dysuria or hematuria. Musculoskeletal: Denies back pain. No joint pain. No muscle tenderness. Extremities: No pain or swelling. Skin: No rashes, skin lesions, or ulcers Neuro:  Neuropathy in fingers and bottom of feeet (stable).  No headache, numbness or weakness, or balance issues. Endocrine: Diabetes.  No thyroid issues, hot flashes or night sweats. Psych: No mood changes, depression or anxiety. Pain: Takes oxycodone every other day. Review of systems: All other systems reviewed and found to be negative.  Physical Exam: Blood pressure 127/88, pulse 61, temperature 95.8 F (35.4 C), temperature source Tympanic, resp. rate 18, height 5' 5"  (1.651 m), weight 146 lb 15 oz (66.65 kg). GENERAL: Elderly woman sitting comfortably in a wheelchair in the exam room in no acute distress. MENTAL STATUS: Alert and oriented to person, place and time. HEAD: Styled curly brown hair. Normocephalic, atraumatic, face symmetric, no Cushingoid features. EYES: Black rimmed glasses. Hazel/green eyes. Pupils equal round and reactive to light and  accomodation. No conjunctivitis or scleral icterus. ENT: Oropharynx clear without lesion. Partial. Tongue normal. Mucous membranes moist.  RESPIRATORY: Clear to auscultation without rales, wheezes or rhonchi. CARDIOVASCULAR: Regular rate and rhythm without murmur, rub or gallop. ABDOMEN: Soft, non-tender with active bowel sounds. No guarding or rebound tenderness. No hepatosplenomegaly. SKIN: No rashes, ulcer or skin lesions.  EXTREMITIES: No edema, no skin discoloration or tenderness. No palpable cords. LYMPH NODES: Right low anterior cervical 2 cm soft node.  No palpable supraclavicular, axillary or inguinal adenopathy  NEUROLOGICAL: Unremarkable. PSYCH: Appropriate.  Appointment on 08/19/2015  Component Date Value Ref Range Status  . WBC 08/19/2015 8.9  3.6 - 11.0 K/uL Corrected  . RBC 08/19/2015 3.52* 3.80 - 5.20 MIL/uL Corrected   CORRECTED ON 05/08 AT 0945: PREVIOUSLY REPORTED AS 3.53  . Hemoglobin 08/19/2015 10.7* 12.0 - 16.0 g/dL Corrected   CORRECTED ON 05/08 AT 0945: PREVIOUSLY REPORTED AS 10.8  . HCT 08/19/2015 31.8* 35.0 - 47.0 % Corrected  .  MCV 08/19/2015 90.4  80.0 - 100.0 fL Corrected   CORRECTED ON 05/08 AT 0945: PREVIOUSLY REPORTED AS 90.1  . MCH 08/19/2015 30.5  26.0 - 34.0 pg Corrected   CORRECTED ON 05/08 AT 0945: PREVIOUSLY REPORTED AS 30.6  . MCHC 08/19/2015 33.7  32.0 - 36.0 g/dL Corrected   CORRECTED ON 05/08 AT 0945: PREVIOUSLY REPORTED AS 34.0  . RDW 08/19/2015 16.2* 11.5 - 14.5 % Corrected   CORRECTED ON 05/08 AT 0945: PREVIOUSLY REPORTED AS 16.4  . Platelets 08/19/2015 271  150 - 440 K/uL Corrected   CORRECTED ON 05/08 AT 0945: PREVIOUSLY REPORTED AS 265  . Neutrophils Relative % 08/19/2015 78%   Corrected  . Neutro Abs 08/19/2015 7.0* 1.4 - 6.5 K/uL Corrected  . Lymphocytes Relative 08/19/2015 12%   Corrected  . Lymphs Abs 08/19/2015 1.1  1.0 - 3.6 K/uL Corrected  . Monocytes Relative 08/19/2015 7%   Corrected  . Monocytes Absolute 08/19/2015  0.6  0.2 - 0.9 K/uL Corrected  . Eosinophils Relative 08/19/2015 3%   Corrected  . Eosinophils Absolute 08/19/2015 0.3  0 - 0.7 K/uL Corrected   CORRECTED ON 05/08 AT 0945: PREVIOUSLY REPORTED AS 0.2  . Basophils Relative 08/19/2015 0%   Corrected  . Basophils Absolute 08/19/2015 0.0  0 - 0.1 K/uL Corrected  . Sodium 08/19/2015 141  135 - 145 mmol/L Final  . Potassium 08/19/2015 4.1  3.5 - 5.1 mmol/L Final  . Chloride 08/19/2015 109  101 - 111 mmol/L Final  . CO2 08/19/2015 22  22 - 32 mmol/L Final  . Glucose, Bld 08/19/2015 118* 65 - 99 mg/dL Final  . BUN 08/19/2015 15  6 - 20 mg/dL Final  . Creatinine, Ser 08/19/2015 0.84  0.44 - 1.00 mg/dL Final  . Calcium 08/19/2015 10.2  8.9 - 10.3 mg/dL Final  . Total Protein 08/19/2015 6.5  6.5 - 8.1 g/dL Final  . Albumin 08/19/2015 3.3* 3.5 - 5.0 g/dL Final  . AST 08/19/2015 19  15 - 41 U/L Final  . ALT 08/19/2015 9* 14 - 54 U/L Final  . Alkaline Phosphatase 08/19/2015 96  38 - 126 U/L Final  . Total Bilirubin 08/19/2015 0.9  0.3 - 1.2 mg/dL Final  . GFR calc non Af Amer 08/19/2015 >60  >60 mL/min Final  . GFR calc Af Amer 08/19/2015 >60  >60 mL/min Final   Comment: (NOTE) The eGFR has been calculated using the CKD EPI equation. This calculation has not been validated in all clinical situations. eGFR's persistently <60 mL/min signify possible Chronic Kidney Disease.   . Anion gap 08/19/2015 10  5 - 15 Final  . Magnesium 08/19/2015 1.9  1.7 - 2.4 mg/dL Final    Assessment:  KARSYN JAMIE is a 70 y.o. female with metastatic colon cancer. She presented with a 53 pound weight loss in 6 months. Colonoscopy on 09/07/2014 revealed a circumferential partially obstructive fungating mass in the descending colon. There was no active bleeding. There was a 1.5 cm pedunculated polyp in the sigmoid colon (tubulovillous adenoma). Pathology from the descending colon mass revealed fragments of adenocarcinoma. There was not enough tissue for KRAS  testing.  CEA was 11,793 on 09/05/2014.  Chest CT angiogram on 08/28/2014 revealed no evidence of pulmonary embolism, but multiple large enhancing liver masses (largest 6.4 cm).  PET scan on 10/04/2014 revealed 6.5 x 6.1 cm hypermetabolic mass in the proximal sigmoid colon,  There were  numerous large hypermetabolic hepatic metastases. There were no additional sites of metastatic  disease in the neck, chest or skeleton.  In addition, there was a 3.2 x 2.8 cm low-attenuation right thyroid nodule.   She is status post 14 cycles of FOLFOX chemotherapy (10/09/2014 - 04/11/2015).  She declined Avastin.  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  CEA was 10,169 on 10/09/2014 , 12,034 on 0712/2016, 7765 on 11/20/2014, 5154 on 12/04/2014, 1921 on 01/01/2015, 1266 on 01/15/2015, 770.6 on 01/29/2015, 502.7 on 02/12/2015, 368.7 on 02/26/2015, 269 on 03/14/2015, 178.3 on 03/28/2015, 135.9 on 04/11/2015, 116.1 on 05/13/2015, 90.8 on 06/10/2015, 145.9 on 07/08/2015, 300.7 on 08/05/2015, and 296.3 on 08/19/2015.  Abdominal and pelvic CT scan on 12/28/2014 revealed response to therapy.  Liver metastases and the descending/sigmoid mass were smaller.  There was no new disease.  Abdomen and pelvic CT scan on 03/25/2015 revealed interval decrease in size of hepatic metastasis and distal descending colon mass.   Abdominal and pelvic CT scan on 03/25/2015 revealed decreasing hepatic metastasis and distal descending colon mass.  Right hepatic lobe lesion was 2.2 x 2.1 cm.  Left lateral hepatic lobe lesion was 1.9 x 1.5 cm.  There was stable thickening of the adrenal glands.  She is status post 7 cycles of 5-fluorouracil and leucovorin (04/28/2014 - 08/05/2015).  Cycle #4 was held on 06/10/2015 secondary to rectal bleeding.  Abdominal and pelvic MRI on 08/01/2015 revealed multiple hypoenhancing hepatic metastasis in both lobes of the liver (3.3 x 2.2 cm in segment VII, 5.9 x 4.1 cm in segment VIII, and a 2.4  x 1.7 cm caudate lesion).  There was a 4.3 cm length of focally narrowed sigmoid colon.  There was nodular thickening of the right adrenal gland.  Symptomatically, she has occasional abdominal pain.  Constipation is well managed.  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  She is on Neurontin.  CEA is increasing.  Plan: 1.  Labs today:  CBC with diff, CMP, Mg. 2.  Discuss review of MRI with multiple liver lesions in both lobes of liver.  Doubt surgical candidate.  Contact [facility] Maudie Mercury at [facility].  Discuss rising CEA.  Discuss plans for restaging PET scan.  Discuss obtaining tissue for KRAS testing (liver biopsy versus colonoscopy).  Patient desires colon biopsy.  Contact Dr. Allen Norris.  Discuss plans for FOLFIRI +/- cetuximab depending on KRAS results.  Side effects reviewed.  Patient agreeable. 3.  No chemotherapy today. 4.  [facility]. Abbe Amsterdam at [facility] [phone]) regarding tumor board recommendations. 5.  Contact Dr. Lucilla Lame- done. 6.  PET scan on 08/21/2015: restaging prior to change in therapy. 7.  Preauth FOLFIRI +/- Erbitux. 8.  RTC next week for MD assessment, labs (CBC with diff, CMP, Mg), and FOLFIRI +/- Erbitux.   Lequita Asal, MD  08/19/2015, 9:56 AM

## 2015-08-19 NOTE — Progress Notes (Signed)
Pt reports sometimes she has pain around waist.  No other concerns noted.  Pt reports she is feeling more tired today.  Pet rescheduled for Wednesday

## 2015-08-20 LAB — CEA: CEA: 296.3 ng/mL — ABNORMAL HIGH (ref 0.0–4.7)

## 2015-08-20 NOTE — Telephone Encounter (Signed)
Pt scheduled for flex sigmoidoscopy at [facility] on 09/02/15. Instructs/rx mailed. Pt's daughter requested this date as she stated her mother has other things scheduled and this will be a good time.

## 2015-08-21 ENCOUNTER — Other Ambulatory Visit: Payer: Self-pay | Admitting: Hematology and Oncology

## 2015-08-21 ENCOUNTER — Ambulatory Visit
Admission: RE | Admit: 2015-08-21 | Discharge: 2015-08-21 | Disposition: A | Discharge status: Home / Self Care | Payer: Medicare Other | Source: Ambulatory Visit | Attending: Hematology and Oncology | Admitting: Hematology and Oncology

## 2015-08-21 DIAGNOSIS — C787 Secondary malignant neoplasm of liver and intrahepatic bile duct: Principal | ICD-10-CM

## 2015-08-21 DIAGNOSIS — C189 Malignant neoplasm of colon, unspecified: Secondary | ICD-10-CM

## 2015-08-21 DIAGNOSIS — R938 Abnormal findings on diagnostic imaging of other specified body structures: Secondary | ICD-10-CM | POA: Diagnosis not present

## 2015-08-21 LAB — GLUCOSE, CAPILLARY: Glucose-Capillary: 72 mg/dL (ref 65–99)

## 2015-08-21 MED ORDER — FLUDEOXYGLUCOSE F - 18 (FDG) INJECTION
13.1300 | Freq: Once | INTRAVENOUS | Status: AC | PRN
  Administered 2015-08-21: 13.13 via INTRAVENOUS

## 2015-08-21 MED ORDER — LOPERAMIDE HCL 2 MG PO TABS: ORAL_TABLET | ORAL | Status: DC

## 2015-08-23 ENCOUNTER — Other Ambulatory Visit: Payer: Self-pay | Admitting: *Deleted

## 2015-08-23 ENCOUNTER — Telehealth: Payer: Self-pay | Admitting: Hematology and Oncology

## 2015-08-23 DIAGNOSIS — C189 Malignant neoplasm of colon, unspecified: Secondary | ICD-10-CM

## 2015-08-23 DIAGNOSIS — R948 Abnormal results of function studies of other organs and systems: Secondary | ICD-10-CM

## 2015-08-23 DIAGNOSIS — C787 Secondary malignant neoplasm of liver and intrahepatic bile duct: Principal | ICD-10-CM

## 2015-08-23 NOTE — Telephone Encounter (Signed)
Re:  Right lower neck lymph node biopsy  Discussed recent PET scan and enlarging right lower neck node. Discussed ultrasound guided biopsy. Patient's daughter informed.  She was in agreement. She will discuss with her mother.  Lequita Asal, MD

## 2015-08-26 ENCOUNTER — Ambulatory Visit: Payer: Medicare Other | Admitting: Anesthesiology

## 2015-08-26 ENCOUNTER — Inpatient Hospital Stay: Payer: Medicare Other

## 2015-08-26 ENCOUNTER — Telehealth: Payer: Self-pay | Admitting: Gastroenterology

## 2015-08-26 ENCOUNTER — Encounter: Payer: Self-pay | Admitting: *Deleted

## 2015-08-26 ENCOUNTER — Encounter: Payer: Self-pay | Admitting: Hematology and Oncology

## 2015-08-26 ENCOUNTER — Other Ambulatory Visit: Payer: Self-pay | Admitting: *Deleted

## 2015-08-26 ENCOUNTER — Inpatient Hospital Stay (HOSPITAL_BASED_OUTPATIENT_CLINIC_OR_DEPARTMENT_OTHER): Payer: Medicare Other | Admitting: Hematology and Oncology

## 2015-08-26 VITALS — BP 138/87 | HR 60 | Temp 94.0°F | Resp 18 | Ht 65.0 in | Wt 143.7 lb

## 2015-08-26 DIAGNOSIS — R109 Unspecified abdominal pain: Secondary | ICD-10-CM | POA: Diagnosis not present

## 2015-08-26 DIAGNOSIS — C787 Secondary malignant neoplasm of liver and intrahepatic bile duct: Principal | ICD-10-CM

## 2015-08-26 DIAGNOSIS — Z7984 Long term (current) use of oral hypoglycemic drugs: Secondary | ICD-10-CM

## 2015-08-26 DIAGNOSIS — C187 Malignant neoplasm of sigmoid colon: Secondary | ICD-10-CM

## 2015-08-26 DIAGNOSIS — C785 Secondary malignant neoplasm of large intestine and rectum: Secondary | ICD-10-CM | POA: Diagnosis not present

## 2015-08-26 DIAGNOSIS — G893 Neoplasm related pain (acute) (chronic): Secondary | ICD-10-CM

## 2015-08-26 DIAGNOSIS — C189 Malignant neoplasm of colon, unspecified: Secondary | ICD-10-CM

## 2015-08-26 DIAGNOSIS — I10 Essential (primary) hypertension: Secondary | ICD-10-CM

## 2015-08-26 DIAGNOSIS — G629 Polyneuropathy, unspecified: Secondary | ICD-10-CM

## 2015-08-26 DIAGNOSIS — C186 Malignant neoplasm of descending colon: Secondary | ICD-10-CM

## 2015-08-26 DIAGNOSIS — E041 Nontoxic single thyroid nodule: Secondary | ICD-10-CM

## 2015-08-26 DIAGNOSIS — M129 Arthropathy, unspecified: Secondary | ICD-10-CM

## 2015-08-26 DIAGNOSIS — R222 Localized swelling, mass and lump, trunk: Secondary | ICD-10-CM

## 2015-08-26 DIAGNOSIS — Z79899 Other long term (current) drug therapy: Secondary | ICD-10-CM

## 2015-08-26 DIAGNOSIS — Z7982 Long term (current) use of aspirin: Secondary | ICD-10-CM

## 2015-08-26 DIAGNOSIS — E119 Type 2 diabetes mellitus without complications: Secondary | ICD-10-CM

## 2015-08-26 DIAGNOSIS — R5383 Other fatigue: Secondary | ICD-10-CM

## 2015-08-26 DIAGNOSIS — Z809 Family history of malignant neoplasm, unspecified: Secondary | ICD-10-CM

## 2015-08-26 DIAGNOSIS — Z87891 Personal history of nicotine dependence: Secondary | ICD-10-CM

## 2015-08-26 DIAGNOSIS — T451X5S Adverse effect of antineoplastic and immunosuppressive drugs, sequela: Secondary | ICD-10-CM

## 2015-08-26 DIAGNOSIS — F4321 Adjustment disorder with depressed mood: Secondary | ICD-10-CM

## 2015-08-26 DIAGNOSIS — R970 Elevated carcinoembryonic antigen [CEA]: Secondary | ICD-10-CM

## 2015-08-26 DIAGNOSIS — G4700 Insomnia, unspecified: Secondary | ICD-10-CM

## 2015-08-26 DIAGNOSIS — Z9221 Personal history of antineoplastic chemotherapy: Secondary | ICD-10-CM

## 2015-08-26 DIAGNOSIS — R634 Abnormal weight loss: Secondary | ICD-10-CM

## 2015-08-26 LAB — CBC WITH DIFFERENTIAL/PLATELET
Basophils Absolute: 0 10*3/uL (ref 0–0.1)
Basophils Relative: 0 %
Eosinophils Absolute: 0.1 10*3/uL (ref 0–0.7)
Eosinophils Relative: 2 %
HCT: 32.4 % — ABNORMAL LOW (ref 35.0–47.0)
Hemoglobin: 11.1 g/dL — ABNORMAL LOW (ref 12.0–16.0)
Lymphocytes Relative: 17 %
Lymphs Abs: 1.2 10*3/uL (ref 1.0–3.6)
MCH: 30.4 pg (ref 26.0–34.0)
MCHC: 34.2 g/dL (ref 32.0–36.0)
MCV: 89.1 fL (ref 80.0–100.0)
Monocytes Absolute: 0.8 10*3/uL (ref 0.2–0.9)
Monocytes Relative: 12 %
Neutro Abs: 4.7 10*3/uL (ref 1.4–6.5)
Neutrophils Relative %: 69 %
Platelets: 281 10*3/uL (ref 150–440)
RBC: 3.64 MIL/uL — ABNORMAL LOW (ref 3.80–5.20)
RDW: 16.4 % — ABNORMAL HIGH (ref 11.5–14.5)
WBC: 6.8 10*3/uL (ref 3.6–11.0)

## 2015-08-26 LAB — COMPREHENSIVE METABOLIC PANEL
ALT: 14 U/L (ref 14–54)
AST: 26 U/L (ref 15–41)
Albumin: 3.4 g/dL — ABNORMAL LOW (ref 3.5–5.0)
Alkaline Phosphatase: 100 U/L (ref 38–126)
Anion gap: 9 (ref 5–15)
BUN: 15 mg/dL (ref 6–20)
CO2: 23 mmol/L (ref 22–32)
Calcium: 9.9 mg/dL (ref 8.9–10.3)
Chloride: 108 mmol/L (ref 101–111)
Creatinine, Ser: 1.01 mg/dL — ABNORMAL HIGH (ref 0.44–1.00)
GFR calc Af Amer: >60 mL/min (ref >60)
GFR calc non Af Amer: 55 mL/min — ABNORMAL LOW (ref >60)
Glucose, Bld: 184 mg/dL — ABNORMAL HIGH (ref 65–99)
Potassium: 4.2 mmol/L (ref 3.5–5.1)
Sodium: 140 mmol/L (ref 135–145)
Total Bilirubin: 0.9 mg/dL (ref 0.3–1.2)
Total Protein: 7.1 g/dL (ref 6.5–8.1)

## 2015-08-26 LAB — PROTIME-INR
INR: 1.23
Prothrombin Time: 15.7 seconds — ABNORMAL HIGH (ref 11.4–15.0)

## 2015-08-26 LAB — APTT: aPTT: 42 seconds — ABNORMAL HIGH (ref 24–36)

## 2015-08-26 LAB — MAGNESIUM: Magnesium: 2 mg/dL (ref 1.7–2.4)

## 2015-08-26 MED ORDER — SODIUM CHLORIDE 0.9% FLUSH
10.0000 mL | INTRAVENOUS | Status: DC | PRN
  Administered 2015-08-26: 10 mL via INTRAVENOUS
  Filled 2015-08-26: qty 10

## 2015-08-26 MED ORDER — HEPARIN SOD (PORK) LOCK FLUSH 100 UNIT/ML IV SOLN
500.0000 [IU] | Freq: Once | INTRAVENOUS | Status: AC
  Administered 2015-08-26: 500 [IU] via INTRAVENOUS
  Filled 2015-08-26: qty 5

## 2015-08-26 NOTE — Telephone Encounter (Signed)
Patients daughter has a question regarding enima

## 2015-08-26 NOTE — Progress Notes (Signed)
Pt daughter reported that pt has not had as much energy  And has some confusion to.  Pt still continues to have difficulty with constipation. Daughter feels pt has gotten worse since last week.  Pt reported she is sweating a lot when sitting and lying in bed.  Pt reports her whole gown can be wet.

## 2015-08-26 NOTE — Progress Notes (Addendum)
[facility] day:  08/26/2015   Chief Complaint: Sandy [name] is a 70 y.o. female with stage IV colon cancer who is seen for review of interval PET scan and initiation of cycle #1 FOLFIRI.  HPI: The patient was last seen in the medical oncology clinic on 08/19/2015.  At that time, chemotherapy was postponed given concerns about progressive disease.  Disposition from surgery was pending.  CEA had increased to 300.7.  We discussed FOLFIR +/- Erbitux.  She had declined Avastin int he past.  She discussed obtaining additional tissue (colon versus liver biopsy) for KRAS testing.  The patient declined liver biospy.  Dr. Allen Norris was contacted.  A PET scan was ordered.  PET scan on 08/21/2015 revealed interval improvement in the multifocal hepatic metastatic disease. There was residual hypermetabolic tumor.  The primary colonic lesion at the junction of the descending and sigmoid colon had decreased in size and metabolic activity.  There was persistent hypermetabolic activity in the lower right neck, possibly associated with the thyroid gland. Metastatic disease to a right supraclavicular lymph node was a consideration. Thyroid ultrasound and biopsy were recommended.  She is scheduled for flexible sigmoidoscopy on 09/02/2015.  During the interim, she has had less energy.  She is weak.  Her daughter worries that she is lying around more.  Her daughter feels like she may be depressed.  The patient's sister died of cancer 1 year ago.  The patient denies depression.  She is eating well.  She has ongoing issues with constipation.  She is scheduled to see Dr. Abbe Amsterdam at [facility] next week.   Past Medical History  Diagnosis Date  . Hypertension   . Arthritis   . Constipation   . Insomnia   . Diabetes mellitus without complication ([place])   . Cancer [facility])     liver mets; colon cancer    Past Surgical History  Procedure Laterality Date  . Colonoscopy with propofol  N/A 09/07/2014    Procedure: COLONOSCOPY WITH PROPOFOL;  Surgeon: Lucilla Lame, MD;  Location: [facility] ENDOSCOPY;  Service: Endoscopy;  Laterality: N/A;  . Esophagogastroduodenoscopy N/A 09/07/2014    Procedure: ESOPHAGOGASTRODUODENOSCOPY (EGD);  Surgeon: Lucilla Lame, MD;  Location: [facility] ENDOSCOPY;  Service: Endoscopy;  Laterality: N/A;  . Portacath placement Left 09/24/2014    Procedure: INSERTION PORT-A-CATH;  Surgeon: Robert Bellow, MD;  Location: [facility] ORS;  Service: General;  Laterality: Left;  . Liver biopsy      Family History  Problem Relation Age of Onset  . Cancer Sister     Social History:  reports that she quit smoking about 12 years ago. Her smoking use included Cigarettes. She has a 5 pack-year smoking history. She has never used smokeless tobacco. She reports that she does not drink alcohol or use illicit drugs.  The patient is accompanied by her daughter.   Allergies: No Known Allergies  Current Medications: Current Outpatient Prescriptions  Medication Sig Dispense Refill  . aspirin EC 81 MG tablet Take 81 mg by mouth daily. Reported on 08/26/2015    . atorvastatin (LIPITOR) 40 MG tablet Take 40 mg by mouth daily.    Marland Kitchen gabapentin (NEURONTIN) 100 MG capsule Take 2 capsules (200 mg total) by mouth at bedtime. 1 tab daily and 2 tabs at bedtime. 60 capsule 0  . lidocaine-prilocaine (EMLA) cream Apply 1 application topically as needed. Apply to port 1 hour prior to chemotherapy appointment. Cover with plastic wrap. 30 g 1  .  lisinopril-hydrochlorothiazide (PRINZIDE,ZESTORETIC) 20-25 MG per tablet Take 1 tablet by mouth daily.    Marland Kitchen loperamide (IMODIUM A-D) 2 MG tablet Take 2 at diarrhea onset , then 1 every 2hr until 12hrs with no BM. May take 2 every 4hrs at night. If diarrhea recurs repeat. 100 tablet 1  . magnesium 30 MG tablet Take 30 mg by mouth daily.     . megestrol (MEGACE) 400 MG/10ML suspension Take 5 mLs (200 mg total) by mouth daily. to stimulate appetite 240 mL 0  .  metFORMIN (GLUCOPHAGE) 1000 MG tablet Take 1,000 mg by mouth 2 (two) times daily.     . metoprolol (LOPRESSOR) 100 MG tablet Take 100 mg by mouth.    . Multiple Vitamin (MULTIVITAMIN WITH MINERALS) TABS tablet Take 1 tablet by mouth daily.    . ondansetron (ZOFRAN) 4 MG tablet Take 1 tablet (4 mg total) by mouth every 8 (eight) hours as needed for nausea or vomiting. 30 tablet 2  . oxyCODONE-acetaminophen (PERCOCET/ROXICET) 5-325 MG tablet Take 1 tablet by mouth every 6 (six) hours as needed for severe pain. 40 tablet 0  . potassium chloride (K-DUR,KLOR-CON) 10 MEQ tablet Take 1 tablet (10 mEq total) by mouth once. Take 1 pill twice a day for 3 days then 1 pill a day. (Patient taking differently: Take 10 mEq by mouth once. ) 30 tablet 0  . sorbitol 70 % solution Take 30 mLs by mouth daily as needed.    Marland Kitchen amLODipine (NORVASC) 5 MG tablet Take 5 mg by mouth daily.    Marland Kitchen lisinopril (PRINIVIL,ZESTRIL) 10 MG tablet Take 10 mg by mouth daily.     No current facility-administered medications for this visit.   Facility-Administered Medications Ordered in Other Visits  Medication Dose Route Frequency Provider Last Rate Last Dose  . acetaminophen (TYLENOL) tablet 650 mg  650 mg Oral Once Lequita Asal, MD   650 mg at 10/19/14 7622  . sodium chloride 0.9 % injection 10 mL  10 mL Intracatheter PRN Lequita Asal, MD   10 mL at 10/11/14 1431  . sodium chloride 0.9 % injection 10 mL  10 mL Intracatheter PRN Lequita Asal, MD   10 mL at 02/28/15 1529  . sodium chloride flush (NS) 0.9 % injection 10 mL  10 mL Intravenous PRN Lequita Asal, MD   10 mL at 08/26/15 0859    Review of Systems:  GENERAL: Fatigue.  No fevers or sweats.  Weight down 3 pounds. PERFORMANCE STATUS (ECOG): 2. HEENT: Wears a hearing aide. No visual changes, runny nose, sore throat, mouth sores or tenderness. Lungs: No shortness of breath or cough. No hemoptysis. Cardiac: No chest pain, palpitations, orthopnea, or  PND. GI:  Eating well.  Occasional left side and epigastric pain.  No nausea, vomiting, diarrhea, constipation, melena or hematochezia. GU: Wears a Depends pad.  No dysuria or hematuria. Musculoskeletal: Denies back pain. No joint pain. No muscle tenderness. Extremities: No pain or swelling. Skin: No rashes, skin lesions, or ulcers Neuro:  Neuropathy in fingers and bottom of feeet (stable).  No headache, numbness or weakness, or balance issues. Endocrine: Diabetes.  No thyroid issues, hot flashes or night sweats. Psych: Patient denies depression.  Daughter concerned about depression.  No mood changes or anxiety. Pain: Takes oxycodone every other day. Review of systems: All other systems reviewed and found to be negative.  Physical Exam: Blood pressure 138/87, pulse 60, temperature 94 F (34.4 C), temperature source Tympanic, resp. rate 18, height  5' 5"  (1.651 m), weight 143 lb 11.8 oz (65.2 kg). GENERAL: Elderly woman sitting comfortably in a wheelchair in the exam room in no acute distress. MENTAL STATUS: Alert and oriented to person, place and time (2017). HEAD: Styled curly brown hair. Normocephalic, atraumatic, face symmetric, no Cushingoid features. EYES: Black rimmed glasses. Hazel/green eyes. Pupils equal round and reactive to light and accomodation. No conjunctivitis or scleral icterus. ENT: Oropharynx clear without lesion. Partial. Tongue normal. Mucous membranes moist.  RESPIRATORY: Clear to auscultation without rales, wheezes or rhonchi. CARDIOVASCULAR: Regular rate and rhythm without murmur, rub or gallop. ABDOMEN: Soft, non-tender with active bowel sounds. No guarding or rebound tenderness. No hepatosplenomegaly. SKIN: No rashes, ulcer or skin lesions.  EXTREMITIES: No edema, no skin discoloration or tenderness. No palpable cords. LYMPH NODES: Right low anterior cervical 2-3 cm soft node.  No palpable supraclavicular, axillary or inguinal adenopathy   NEUROLOGICAL: Alert & oriented.  Knows date, events (Mother's Day yesterday), Presidents, number of nickels in a quarter and quarters in a dollar, counts backwards from 10, remembers 3 of 3 words immediately and 2 of 3 in 5 minutes.  Cranial nerves II-XII intact; motor strength and sensation grossly intact. PSYCH:  Appropriate.   Infusion on 08/26/2015  Component Date Value Ref Range Status  . WBC 08/26/2015 6.8  3.6 - 11.0 K/uL Final  . RBC 08/26/2015 3.64* 3.80 - 5.20 MIL/uL Final  . Hemoglobin 08/26/2015 11.1* 12.0 - 16.0 g/dL Final  . HCT 08/26/2015 32.4* 35.0 - 47.0 % Final  . MCV 08/26/2015 89.1  80.0 - 100.0 fL Final  . MCH 08/26/2015 30.4  26.0 - 34.0 pg Final  . MCHC 08/26/2015 34.2  32.0 - 36.0 g/dL Final  . RDW 08/26/2015 16.4* 11.5 - 14.5 % Final  . Platelets 08/26/2015 281  150 - 440 K/uL Final  . Neutrophils Relative % 08/26/2015 69   Final  . Neutro Abs 08/26/2015 4.7  1.4 - 6.5 K/uL Final  . Lymphocytes Relative 08/26/2015 17   Final  . Lymphs Abs 08/26/2015 1.2  1.0 - 3.6 K/uL Final  . Monocytes Relative 08/26/2015 12   Final  . Monocytes Absolute 08/26/2015 0.8  0.2 - 0.9 K/uL Final  . Eosinophils Relative 08/26/2015 2   Final  . Eosinophils Absolute 08/26/2015 0.1  0 - 0.7 K/uL Final  . Basophils Relative 08/26/2015 0   Final  . Basophils Absolute 08/26/2015 0.0  0 - 0.1 K/uL Final  . Sodium 08/26/2015 140  135 - 145 mmol/L Final  . Potassium 08/26/2015 4.2  3.5 - 5.1 mmol/L Final  . Chloride 08/26/2015 108  101 - 111 mmol/L Final  . CO2 08/26/2015 23  22 - 32 mmol/L Final  . Glucose, Bld 08/26/2015 184* 65 - 99 mg/dL Final  . BUN 08/26/2015 15  6 - 20 mg/dL Final  . Creatinine, Ser 08/26/2015 1.01* 0.44 - 1.00 mg/dL Final  . Calcium 08/26/2015 9.9  8.9 - 10.3 mg/dL Final  . Total Protein 08/26/2015 7.1  6.5 - 8.1 g/dL Final  . Albumin 08/26/2015 3.4* 3.5 - 5.0 g/dL Final  . AST 08/26/2015 26  15 - 41 U/L Final  . ALT 08/26/2015 14  14 - 54 U/L Final  .  Alkaline Phosphatase 08/26/2015 100  38 - 126 U/L Final  . Total Bilirubin 08/26/2015 0.9  0.3 - 1.2 mg/dL Final  . GFR calc non Af Amer 08/26/2015 55* >60 mL/min Final  . GFR calc Af Amer 08/26/2015 >60  >60  mL/min Final   Comment: (NOTE) The eGFR has been calculated using the CKD EPI equation. This calculation has not been validated in all clinical situations. eGFR's persistently <60 mL/min signify possible Chronic Kidney Disease.   . Anion gap 08/26/2015 9  5 - 15 Final  . Magnesium 08/26/2015 2.0  1.7 - 2.4 mg/dL Final  . Prothrombin Time 08/26/2015 15.7* 11.4 - 15.0 seconds Final  . INR 08/26/2015 1.23   Final    Assessment:  Sandy [name] is a 70 y.o. female with metastatic colon cancer. She presented with a 53 pound weight loss in 6 months. Colonoscopy on 09/07/2014 revealed a circumferential partially obstructive fungating mass in the descending colon. There was no active bleeding. There was a 1.5 cm pedunculated polyp in the sigmoid colon (tubulovillous adenoma). Pathology from the descending colon mass revealed fragments of adenocarcinoma. There was not enough tissue for KRAS testing.  CEA was 11,793 on 09/05/2014.  Chest CT angiogram on 08/28/2014 revealed no evidence of pulmonary embolism, but multiple large enhancing liver masses (largest 6.4 cm).  PET scan on 10/04/2014 revealed 6.5 x 6.1 cm hypermetabolic mass in the proximal sigmoid colon,  There were  numerous large hypermetabolic hepatic metastases. There were no additional sites of metastatic disease in the neck, chest or skeleton.  In addition, there was a 3.2 x 2.8 cm low-attenuation right thyroid nodule.   She is status post 14 cycles of FOLFOX chemotherapy (10/09/2014 - 04/11/2015).  She declined Avastin.  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  CEA was 10,169 on 10/09/2014 , 12,034 on 0712/2016, 7765 on 11/20/2014, 5154 on 12/04/2014, 1921 on 01/01/2015, 1266 on 01/15/2015, 770.6 on  01/29/2015, 502.7 on 02/12/2015, 368.7 on 02/26/2015, 269 on 03/14/2015, 178.3 on 03/28/2015, 135.9 on 04/11/2015, 116.1 on 05/13/2015, 90.8 on 06/10/2015, 145.9 on 07/08/2015, 300.7 on 08/05/2015, and 296.3 on 08/19/2015.  Abdominal and pelvic CT scan on 12/28/2014 revealed response to therapy.  Liver metastases and the descending/sigmoid mass were smaller.  There was no new disease.  Abdomen and pelvic CT scan on 03/25/2015 revealed interval decrease in size of hepatic metastasis and distal descending colon mass.   Abdominal and pelvic CT scan on 03/25/2015 revealed decreasing hepatic metastasis and distal descending colon mass.  Right hepatic lobe lesion was 2.2 x 2.1 cm.  Left lateral hepatic lobe lesion was 1.9 x 1.5 cm.  There was stable thickening of the adrenal glands.  She is status post 7 cycles of 5-fluorouracil and leucovorin (04/28/2014 - 08/05/2015).  Cycle #4 was held on 06/10/2015 secondary to rectal bleeding.  Abdominal and pelvic MRI on 08/01/2015 revealed multiple hypoenhancing hepatic metastasis in both lobes of the liver (3.3 x 2.2 cm in segment VII, 5.9 x 4.1 cm in segment VIII, and a 2.4 x 1.7 cm caudate lesion).  There was a 4.3 cm length of focally narrowed sigmoid colon.  There was nodular thickening of the right adrenal gland.  PET scan on 08/21/2015 revealed interval improvement in the multifocal hepatic metastatic disease. There was residual hypermetabolic tumor.  The primary colonic lesion at the junction of the descending and sigmoid colon had decreased in size and metabolic activity.  There was persistent hypermetabolic activity in the lower right neck, possibly associated with the thyroid gland. Metastatic disease to a right supraclavicular lymph node was a consideration.  Symptomatically, she is more weak.  She has occasional left side and epigastric pain.  Constipation is well managed.  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  Her daughter is concerned about  depression (patient denies).  CEA is increasing.  Plan: 1.  Labs today:  CBC with diff, CMP, Mg, PT, PTT. 2.  Review PET scan with patient.  Discuss enlarging right low anterior neck mass (thyoid or colon). 3.  Review PET scan with radiology in conjunction with abdominal MRI and CT scan. 4.  Discuss plan for right low anterior neck ultrasound guided biopsy this week.  Patient consented. 5.  Discuss plan for sigmoidoscopy and biopsy on 09/02/2015. 6.  Encourage follow-up with Dr. Ellin Goodie re: depression. 7.  Postpone chemotherapy. 8.  Follow-up as scheduled with Dr. Abbe Amsterdam at [facility].  9.  RTC on 09/03/2015 for MD assessment, labs (CBC with diff, CMP), and cycle #1 FOLFIRI.   Lequita Asal, MD  08/26/2015, 1:06 PM   Addendum:  Request is made for nursing visits as patient has metastatic colon cancer.  She is weak and needs assistance with showering and getting dressed.  She needs a physical therapy evaluation for general weakness and to assist with strength building.

## 2015-08-27 ENCOUNTER — Telehealth: Payer: Self-pay | Admitting: Gastroenterology

## 2015-08-27 ENCOUNTER — Other Ambulatory Visit: Payer: Self-pay | Admitting: *Deleted

## 2015-08-27 ENCOUNTER — Telehealth: Payer: Self-pay | Admitting: *Deleted

## 2015-08-27 DIAGNOSIS — C787 Secondary malignant neoplasm of liver and intrahepatic bile duct: Principal | ICD-10-CM

## 2015-08-27 DIAGNOSIS — C189 Malignant neoplasm of colon, unspecified: Secondary | ICD-10-CM

## 2015-08-27 DIAGNOSIS — C187 Malignant neoplasm of sigmoid colon: Secondary | ICD-10-CM | POA: Insufficient documentation

## 2015-08-27 DIAGNOSIS — R948 Abnormal results of function studies of other organs and systems: Secondary | ICD-10-CM

## 2015-08-27 NOTE — Telephone Encounter (Signed)
Patients daughter has a question regarding stopping aspirin before her procedure on Monday the 22nd. Please call.

## 2015-08-27 NOTE — Telephone Encounter (Signed)
See duplicate message.  ?

## 2015-08-27 NOTE — Telephone Encounter (Signed)
Called and spoke to daughter [name] and tell her that we got bx. Set up of her thyroid  bx and it will be 1 pm on Friday 5/19.  She will need to arrive at 12:30.  She can eat and drink all she wants.  I do need the patient to come back and redraw her blood work.  The PT/INR was high and it was because she had it done from port.  Her daughter is willing to bring her tom at 2:30to have it redrawn in her arm.

## 2015-08-27 NOTE — Telephone Encounter (Signed)
LVM for pt to return my call.

## 2015-08-28 ENCOUNTER — Inpatient Hospital Stay: Payer: Medicare Other

## 2015-08-28 NOTE — Telephone Encounter (Signed)
Left vm letting pt's daughter know her mother doesn't need to stop her ASA 81mg  prior to her procedure. Advised her to call me back to confirm.

## 2015-08-29 ENCOUNTER — Inpatient Hospital Stay: Payer: Medicare Other

## 2015-08-29 DIAGNOSIS — C787 Secondary malignant neoplasm of liver and intrahepatic bile duct: Principal | ICD-10-CM

## 2015-08-29 DIAGNOSIS — C189 Malignant neoplasm of colon, unspecified: Secondary | ICD-10-CM

## 2015-08-29 DIAGNOSIS — C187 Malignant neoplasm of sigmoid colon: Secondary | ICD-10-CM | POA: Diagnosis not present

## 2015-08-29 LAB — PROTIME-INR
INR: 1.15
Prothrombin Time: 14.9 seconds (ref 11.4–15.0)

## 2015-08-29 LAB — APTT: aPTT: 34 seconds (ref 24–36)

## 2015-08-30 ENCOUNTER — Ambulatory Visit
Admission: RE | Admit: 2015-08-30 | Discharge: 2015-08-30 | Disposition: A | Discharge status: Home / Self Care | Payer: Medicare Other | Source: Ambulatory Visit | Attending: Hematology and Oncology | Admitting: Hematology and Oncology

## 2015-08-30 DIAGNOSIS — R948 Abnormal results of function studies of other organs and systems: Secondary | ICD-10-CM

## 2015-08-30 DIAGNOSIS — R896 Abnormal cytological findings in specimens from other organs, systems and tissues: Secondary | ICD-10-CM | POA: Insufficient documentation

## 2015-08-30 DIAGNOSIS — E041 Nontoxic single thyroid nodule: Secondary | ICD-10-CM | POA: Diagnosis not present

## 2015-08-30 DIAGNOSIS — C787 Secondary malignant neoplasm of liver and intrahepatic bile duct: Secondary | ICD-10-CM

## 2015-08-30 DIAGNOSIS — C189 Malignant neoplasm of colon, unspecified: Secondary | ICD-10-CM

## 2015-08-30 NOTE — Procedures (Signed)
R thyroid nodule FNA No comp/EBL

## 2015-08-30 NOTE — Discharge Instructions (Signed)
Thyroid Biopsy, Care After  Refer to this sheet in the next few weeks. These instructions provide you with information on caring for yourself after your procedure. Your health care provider may also give you more specific instructions. Your treatment has been planned according to current medical practices, but problems sometimes occur. Call your health care provider if you have any problems or questions after your procedure.   WHAT TO EXPECT AFTER THE PROCEDURE  After your procedure, it is typical to have the following:   You may have soreness and tenderness at the biopsy site for a few days.   You may have a sore throat or a hoarse voice if you had an open biopsy. This should go away after a couple days.  HOME CARE INSTRUCTIONS   Take medicines only as directed by your health care provider.   To ease discomfort at the biopsy site:    Keep your head raised on a pillow when you are lying down.    Support the back of your head and neck with both hands as you sit up from a lying position.    If you have a sore throat, try using throat lozenges or gargling with warm salt water.    Keep all follow-up visits as directed by your health care provider. This is important.  SEEK MEDICAL CARE IF:   You have a fever.  SEEK IMMEDIATE MEDICAL CARE IF:   You have severe bleeding from the biopsy site.    You have difficulty swallowing.    You have drainage, redness, swelling, or pain at the biopsy site.    You have swollen glands (lymph nodes) in your neck.      This information is not intended to replace advice given to you by your health care provider. Make sure you discuss any questions you have with your health care provider.     Document Released: 10/25/2013 Document Reviewed: 10/25/2013  Elsevier Interactive Patient Education 2016 [employer] Inc[employer]

## 2015-08-30 NOTE — Discharge Instructions (Signed)

## 2015-09-02 ENCOUNTER — Encounter
Admission: RE | Disposition: A | Discharge status: Home / Self Care | Payer: Self-pay | Source: Ambulatory Visit | Attending: Gastroenterology

## 2015-09-02 ENCOUNTER — Ambulatory Visit
Admission: RE | Admit: 2015-09-02 | Discharge: 2015-09-02 | Disposition: A | Discharge status: Home / Self Care | Payer: Medicare Other | Source: Ambulatory Visit | Attending: Gastroenterology | Admitting: Gastroenterology

## 2015-09-02 DIAGNOSIS — M1990 Unspecified osteoarthritis, unspecified site: Secondary | ICD-10-CM | POA: Insufficient documentation

## 2015-09-02 DIAGNOSIS — I10 Essential (primary) hypertension: Secondary | ICD-10-CM | POA: Diagnosis not present

## 2015-09-02 DIAGNOSIS — G4700 Insomnia, unspecified: Secondary | ICD-10-CM | POA: Insufficient documentation

## 2015-09-02 DIAGNOSIS — C187 Malignant neoplasm of sigmoid colon: Secondary | ICD-10-CM | POA: Insufficient documentation

## 2015-09-02 DIAGNOSIS — G629 Polyneuropathy, unspecified: Secondary | ICD-10-CM | POA: Insufficient documentation

## 2015-09-02 DIAGNOSIS — Z7982 Long term (current) use of aspirin: Secondary | ICD-10-CM | POA: Insufficient documentation

## 2015-09-02 DIAGNOSIS — Z87891 Personal history of nicotine dependence: Secondary | ICD-10-CM | POA: Diagnosis not present

## 2015-09-02 DIAGNOSIS — E119 Type 2 diabetes mellitus without complications: Secondary | ICD-10-CM | POA: Insufficient documentation

## 2015-09-02 DIAGNOSIS — Z8505 Personal history of malignant neoplasm of liver: Secondary | ICD-10-CM | POA: Diagnosis not present

## 2015-09-02 DIAGNOSIS — Z79899 Other long term (current) drug therapy: Secondary | ICD-10-CM | POA: Insufficient documentation

## 2015-09-02 DIAGNOSIS — Z1211 Encounter for screening for malignant neoplasm of colon: Secondary | ICD-10-CM | POA: Diagnosis not present

## 2015-09-02 DIAGNOSIS — Z85038 Personal history of other malignant neoplasm of large intestine: Secondary | ICD-10-CM | POA: Diagnosis not present

## 2015-09-02 HISTORY — DX: Unspecified hearing loss, unspecified ear: H91.90

## 2015-09-02 HISTORY — DX: Presence of dental prosthetic device (complete) (partial): Z97.2

## 2015-09-02 HISTORY — DX: Polyneuropathy, unspecified: G62.9

## 2015-09-02 HISTORY — PX: FLEXIBLE SIGMOIDOSCOPY: SHX5431

## 2015-09-02 LAB — GLUCOSE, CAPILLARY: GLUCOSE-CAPILLARY: 78 mg/dL (ref 65–99)

## 2015-09-02 SURGERY — SIGMOIDOSCOPY, FLEXIBLE
Anesthesia: Monitor Anesthesia Care | Wound class: Contaminated

## 2015-09-02 MED ORDER — ACETAMINOPHEN 325 MG PO TABS: 325.0000 mg | ORAL_TABLET | ORAL | Status: DC | PRN

## 2015-09-02 MED ORDER — STERILE WATER FOR IRRIGATION IR SOLN
Status: DC | PRN
  Administered 2015-09-02: 09:00:00

## 2015-09-02 MED ORDER — LACTATED RINGERS IV SOLN: INTRAVENOUS | Status: DC

## 2015-09-02 MED ORDER — ACETAMINOPHEN 160 MG/5ML PO SOLN: 325.0000 mg | ORAL | Status: DC | PRN

## 2015-09-02 MED ORDER — ONDANSETRON HCL 4 MG/2ML IJ SOLN: 4.0000 mg | Freq: Once | INTRAMUSCULAR | Status: DC | PRN

## 2015-09-02 SURGICAL SUPPLY — 22 items
CANISTER SUCT 1200ML W/VALVE (MISCELLANEOUS) ×3 IMPLANT
CLIP HMST 235XBRD CATH ROT (MISCELLANEOUS) IMPLANT
CLIP RESOLUTION 360 11X235 (MISCELLANEOUS)
FCP ESCP3.2XJMB 240X2.8X (MISCELLANEOUS)
FORCEPS BIOP RAD 4 LRG CAP 4 (CUTTING FORCEPS) ×3 IMPLANT
FORCEPS BIOP RJ4 240 W/NDL (MISCELLANEOUS)
FORCEPS ESCP3.2XJMB 240X2.8X (MISCELLANEOUS) IMPLANT
GOWN CVR UNV OPN BCK APRN NK (MISCELLANEOUS) ×2 IMPLANT
GOWN ISOL THUMB LOOP REG UNIV (MISCELLANEOUS) ×4
INJECTOR VARIJECT VIN23 (MISCELLANEOUS) IMPLANT
KIT DEFENDO VALVE AND CONN (KITS) IMPLANT
KIT ENDO PROCEDURE OLY (KITS) ×3 IMPLANT
MARKER SPOT ENDO TATTOO 5ML (MISCELLANEOUS) IMPLANT
PAD GROUND ADULT SPLIT (MISCELLANEOUS) IMPLANT
PROBE APC STR FIRE (PROBE) IMPLANT
SNARE SHORT THROW 13M SML OVAL (MISCELLANEOUS) IMPLANT
SNARE SHORT THROW 30M LRG OVAL (MISCELLANEOUS) IMPLANT
SNARE SNG USE RND 15MM (INSTRUMENTS) IMPLANT
SPOT EX ENDOSCOPIC TATTOO (MISCELLANEOUS)
TRAP ETRAP POLY (MISCELLANEOUS) IMPLANT
VARIJECT INJECTOR VIN23 (MISCELLANEOUS)
WATER STERILE IRR 250ML POUR (IV SOLUTION) ×3 IMPLANT

## 2015-09-02 NOTE — H&P (Signed)
[facility]  [address], [place], [place] [address] Phone: [phone] Fax : [phone]  Primary Care Physician:  [name], MD Primary Gastroenterologist:  Dr. Allen Norris  Pre-Procedure History & Physical: HPI:  Sandy [name] is a 70 y.o. female is here for an flexible sigmoidoscopy.   Past Medical History  Diagnosis Date  . Hypertension   . Constipation   . Insomnia   . Diabetes mellitus without complication ([place])   . Cancer ([place])     liver mets; colon cancer  . 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 HOH (hard of hearing)   . Wears dentures     partial upper and lower    Past Surgical History  Procedure Laterality Date  . Colonoscopy with propofol N/A 09/07/2014    Procedure: COLONOSCOPY WITH PROPOFOL;  Surgeon: Lucilla Lame, MD;  Location: [facility] ENDOSCOPY;  Service: Endoscopy;  Laterality: N/A;  . Esophagogastroduodenoscopy N/A 09/07/2014    Procedure: ESOPHAGOGASTRODUODENOSCOPY (EGD);  Surgeon: Lucilla Lame, MD;  Location: [facility] ENDOSCOPY;  Service: Endoscopy;  Laterality: N/A;  . Portacath placement Left 09/24/2014    Procedure: INSERTION PORT-A-CATH;  Surgeon: Robert Bellow, MD;  Location: [facility] ORS;  Service: General;  Laterality: Left;  . Liver biopsy    . Cardiac catheterization      Prior to Admission medications   Medication Sig Start Date End Date Taking? Authorizing Provider  amLODipine (NORVASC) 5 MG tablet Take 5 mg by mouth daily.   Yes Historical Provider, MD  aspirin EC 81 MG tablet Take 81 mg by mouth daily. Reported on 08/26/2015   Yes Historical Provider, MD  atorvastatin (LIPITOR) 40 MG tablet Take 40 mg by mouth daily.   Yes Historical Provider, MD  gabapentin (NEURONTIN) 100 MG capsule Take 2 capsules (200 mg total) by mouth at bedtime. 1 tab daily and 2 tabs at bedtime. 07/16/15  Yes Lequita Asal, MD  lidocaine-prilocaine (EMLA) cream Apply 1 application topically as needed. Apply to port 1 hour  prior to chemotherapy appointment. Cover with plastic wrap. 07/08/15  Yes Lequita Asal, MD  lisinopril (PRINIVIL,ZESTRIL) 10 MG tablet Take 10 mg by mouth daily.   Yes Historical Provider, MD  loperamide (IMODIUM A-D) 2 MG tablet Take 2 at diarrhea onset , then 1 every 2hr until 12hrs with no BM. May take 2 every 4hrs at night. If diarrhea recurs repeat. 08/21/15  Yes Lequita Asal, MD  magnesium 30 MG tablet Take 30 mg by mouth daily.    Yes Historical Provider, MD  megestrol (MEGACE) 400 MG/10ML suspension Take 5 mLs (200 mg total) by mouth daily. to stimulate appetite 11/06/14  Yes Lequita Asal, MD  metFORMIN (GLUCOPHAGE) 1000 MG tablet Take 1,000 mg by mouth 2 (two) times daily.    Yes Historical Provider, MD  metoprolol (LOPRESSOR) 100 MG tablet Take 100 mg by mouth.   Yes Historical Provider, MD  Multiple Vitamin (MULTIVITAMIN WITH MINERALS) TABS tablet Take 1 tablet by mouth daily.   Yes Historical Provider, MD  ondansetron (ZOFRAN) 4 MG tablet Take 1 tablet (4 mg total) by mouth every 8 (eight) hours as needed for nausea or vomiting. 12/13/14  Yes Lequita Asal, MD  oxyCODONE-acetaminophen (PERCOCET/ROXICET) 5-325 MG tablet Take 1 tablet by mouth every 6 (six) hours as needed for severe pain. 08/19/15  Yes Lequita Asal, MD  potassium chloride (K-DUR,KLOR-CON) 10 MEQ tablet Take 1 tablet (10 mEq total) by  mouth once. Take 1 pill twice a day for 3 days then 1 pill a day. Patient taking differently: Take 10 mEq by mouth once.  03/11/15  Yes Lequita Asal, MD  lisinopril-hydrochlorothiazide (PRINZIDE,ZESTORETIC) 20-25 MG per tablet Take 1 tablet by mouth daily. Reported on 08/30/2015    Historical Provider, MD  sorbitol 70 % solution Take 30 mLs by mouth daily as needed.    Historical Provider, MD    Allergies as of 08/19/2015  . (No Known Allergies)    Family History  Problem Relation Age of Onset  . Cancer Sister     Social History   Social History  .  Marital Status: Widowed    Spouse Name: N/A  . Number of Children: N/A  . Years of Education: N/A   Occupational History  . Not on file.   Social History Main Topics  . Smoking status: Former Smoker -- 0.25 packs/day for 20 years    Types: Cigarettes    Quit date: 09/06/2013  . Smokeless tobacco: Never Used  . Alcohol Use: No  . Drug Use: No  . Sexual Activity: No   Other Topics Concern  . Not on file   Social History Narrative    Review of Systems: See HPI, otherwise negative ROS  Physical Exam: BP 147/68 mmHg  Pulse 56  Temp(Src) 97.9 F (36.6 C) (Tympanic)  Resp 18  Ht 5\' 5"  (1.651 m)  Wt 149 lb (67.586 kg)  BMI 24.79 kg/m2  SpO2 100% General:   Alert,  pleasant and cooperative in NAD Head:  Normocephalic and atraumatic. Neck:  Supple; no masses or thyromegaly. Lungs:  Clear throughout to auscultation.    Heart:  Regular rate and rhythm. Abdomen:  Soft, nontender and nondistended. Normal bowel sounds, without guarding, and without rebound.   Neurologic:  Alert and  oriented x4;  grossly normal neurologically.  Impression/Plan: Sandy [name] is here for an flexible sigmoidoscopy to be performed for sigmoid mass  Risks, benefits, limitations, and alternatives regarding  flexible sigmoidoscopy have been reviewed with the patient.  Questions have been answered.  All parties agreeable.   Lucilla Lame, MD  09/02/2015, 8:27 AM

## 2015-09-02 NOTE — Op Note (Signed)
[facility] Gastroenterology Patient Name: Sandy [name] Procedure Date: 09/02/2015 8:52 AM MRN: [id number] Account #: [id number] Date of Birth: 1945-10-21 Admit Type: Outpatient Age: 70 Room: [facility] OR ROOM 01 Gender: Female Note Status: Finalized Procedure:            Flexible Sigmoidoscopy Indications:          High risk colon cancer surveillance: Personal history                        of colon cancer Providers:            Lucilla Lame, MD Referring MD:         Drue Second. Corcoran (Referring MD) Medicines:            None Complications:        No immediate complications. Procedure:            Pre-Anesthesia Assessment:                       - Prior to the procedure, a History and Physical was                        performed, and patient medications and allergies were                        reviewed. The patient's tolerance of previous                        anesthesia was also reviewed. The risks and benefits of                        the procedure and the sedation options and risks were                        discussed with the patient. All questions were                        answered, and informed consent was obtained. Prior                        Anticoagulants: The patient has taken no previous                        anticoagulant or antiplatelet agents. ASA Grade                        Assessment: II - A patient with mild systemic disease.                        After reviewing the risks and benefits, the patient was                        deemed in satisfactory condition to undergo the                        procedure.                       After obtaining informed consent, the scope was passed  under direct vision. The Olympus GIF H180J colonscope                        [id number]) was introduced through the anus and                        advanced to the the sigmoid colon. The flexible                        sigmoidoscopy was  accomplished without difficulty. The                        patient tolerated the procedure well. The quality of                        the bowel preparation was poor. Findings:      The perianal and digital rectal examinations were normal.      A moderate amount of stool was found in the sigmoid colon.      A partially obstructing large mass was found in the sigmoid colon. The       mass was circumferential. No bleeding was present. Mucosa was biopsied       with a cold forceps for histology. Impression:           - Preparation of the colon was poor.                       - Stool in the sigmoid colon.                       - Malignant partially obstructing tumor in the sigmoid                        colon. Biopsied. Recommendation:       - Await pathology results. Procedure Code(s):    --- Professional ---                       [phone], Sigmoidoscopy, flexible; diagnostic, including                        collection of specimen(s) by brushing or washing, when                        performed (separate procedure) Diagnosis Code(s):    --- Professional ---                       [id number], Personal history of other malignant neoplasm                        of large intestine                       C18.7, Malignant neoplasm of sigmoid colon CPT copyright 2016 American [employer] Association. All rights reserved. The codes documented in this report are preliminary and upon coder review may  be revised to meet current compliance requirements. Lucilla Lame, MD 09/02/2015 9:12:48 AM This report has been signed electronically. Number of Addenda: 0 Note Initiated On: 09/02/2015 8:52 AM Total Procedure Duration: 0 hours 4 minutes 19 seconds       [facility]

## 2015-09-02 NOTE — Anesthesia Preprocedure Evaluation (Deleted)
Anesthesia Evaluation  Patient identified by MRN, date of birth, ID band Patient awake    Reviewed: Allergy & Precautions, H&P , NPO status , Patient's Chart, lab work & pertinent test results, reviewed documented beta blocker date and time   Airway Mallampati: II  TM Distance: >3 FB Neck ROM: full    Dental no notable dental hx.    Pulmonary neg pulmonary ROS, Current Smoker, former smoker,    Pulmonary exam normal breath sounds clear to auscultation       Cardiovascular Exercise Tolerance: Good hypertension, negative cardio ROS   Rhythm:regular Rate:Normal     Neuro/Psych negative neurological ROS  negative psych ROS   GI/Hepatic Liver mets Colon CA    Endo/Other  negative endocrine ROSdiabetes  Renal/GU negative Renal ROS  negative genitourinary   Musculoskeletal  (+) Arthritis ,   Abdominal   Peds  Hematology negative hematology ROS (+) anemia ,   Anesthesia Other Findings   Reproductive/Obstetrics negative OB ROS                            Anesthesia Physical  Anesthesia Plan  ASA: III  Anesthesia Plan: MAC   Post-op Pain Management:    Induction:   Airway Management Planned:   Additional Equipment:   Intra-op Plan:   Post-operative Plan:   Informed Consent: I have reviewed the patients History and Physical, chart, labs and discussed the procedure including the risks, benefits and alternatives for the proposed anesthesia with the patient or authorized representative who has indicated his/her understanding and acceptance.   Dental Advisory Given  Plan Discussed with: CRNA  Anesthesia Plan Comments:         Anesthesia Quick Evaluation

## 2015-09-02 NOTE — OR Nursing (Signed)
In the room- pulse 57, O2 100%, and blood pressure 122/95 Out of the room- puse 63, O2 100%, and blood pressure 168/78

## 2015-09-03 ENCOUNTER — Inpatient Hospital Stay: Payer: Medicare Other

## 2015-09-03 ENCOUNTER — Encounter: Payer: Self-pay | Admitting: Gastroenterology

## 2015-09-03 ENCOUNTER — Inpatient Hospital Stay: Payer: Medicare Other | Admitting: Hematology and Oncology

## 2015-09-03 ENCOUNTER — Other Ambulatory Visit: Payer: Self-pay | Admitting: Hematology and Oncology

## 2015-09-03 DIAGNOSIS — C787 Secondary malignant neoplasm of liver and intrahepatic bile duct: Principal | ICD-10-CM

## 2015-09-03 DIAGNOSIS — C189 Malignant neoplasm of colon, unspecified: Secondary | ICD-10-CM

## 2015-09-04 ENCOUNTER — Encounter: Payer: Self-pay | Admitting: *Deleted

## 2015-09-16 ENCOUNTER — Telehealth: Payer: Self-pay | Admitting: Hematology and Oncology

## 2015-09-16 NOTE — Telephone Encounter (Signed)
Sandy [name] called to let Dr. Mike Gip know that they will begin sending a nurse out to patient from [place] starting tomorrow, 09/17/15.

## 2015-09-17 ENCOUNTER — Telehealth: Payer: Self-pay | Admitting: *Deleted

## 2015-09-17 ENCOUNTER — Other Ambulatory Visit: Payer: Self-pay | Admitting: Hematology and Oncology

## 2015-09-17 NOTE — Telephone Encounter (Signed)
Called and spoke to daughter of pt. Sandy [name].  Told her that we are expecting the special testing results for pt on 6/8. Then dr Sandy [name] wanted to clarify the status of the liver mets.  Comparing the scan here and the one done at [facility].  Neither facility can see the other ones scans so we ordered the disk and gave to radiology here to compare and they are having trouble loading it to their system but they feel sure it will be done tom and can compare and give Sandy [name] an answer.  She tol dme to put pt on for next week and have labs, see md and start treatment of folfiri and poss.erbitux.  Both pt and daughter agreeable and they will  be here next tues at 9 am

## 2015-09-17 NOTE — Telephone Encounter (Signed)
Has no fu appt and said she was to hear something after bx and lab results. Please advise

## 2015-09-20 LAB — SURGICAL PATHOLOGY

## 2015-09-23 ENCOUNTER — Other Ambulatory Visit: Payer: Self-pay

## 2015-09-23 DIAGNOSIS — C787 Secondary malignant neoplasm of liver and intrahepatic bile duct: Principal | ICD-10-CM

## 2015-09-23 DIAGNOSIS — C189 Malignant neoplasm of colon, unspecified: Secondary | ICD-10-CM

## 2015-09-24 ENCOUNTER — Encounter: Payer: Self-pay | Admitting: Pathology

## 2015-09-24 ENCOUNTER — Inpatient Hospital Stay: Payer: Medicare Other

## 2015-09-24 ENCOUNTER — Inpatient Hospital Stay (HOSPITAL_BASED_OUTPATIENT_CLINIC_OR_DEPARTMENT_OTHER): Payer: Medicare Other | Admitting: Hematology and Oncology

## 2015-09-24 ENCOUNTER — Inpatient Hospital Stay: Payer: Medicare Other | Attending: Hematology and Oncology

## 2015-09-24 ENCOUNTER — Other Ambulatory Visit: Payer: Self-pay | Admitting: Hematology and Oncology

## 2015-09-24 ENCOUNTER — Encounter: Payer: Self-pay | Admitting: Hematology and Oncology

## 2015-09-24 DIAGNOSIS — C787 Secondary malignant neoplasm of liver and intrahepatic bile duct: Secondary | ICD-10-CM | POA: Diagnosis not present

## 2015-09-24 DIAGNOSIS — Z7982 Long term (current) use of aspirin: Secondary | ICD-10-CM | POA: Insufficient documentation

## 2015-09-24 DIAGNOSIS — G4730 Sleep apnea, unspecified: Secondary | ICD-10-CM

## 2015-09-24 DIAGNOSIS — F17210 Nicotine dependence, cigarettes, uncomplicated: Secondary | ICD-10-CM | POA: Insufficient documentation

## 2015-09-24 DIAGNOSIS — R5383 Other fatigue: Secondary | ICD-10-CM | POA: Diagnosis not present

## 2015-09-24 DIAGNOSIS — R109 Unspecified abdominal pain: Secondary | ICD-10-CM | POA: Insufficient documentation

## 2015-09-24 DIAGNOSIS — Z79899 Other long term (current) drug therapy: Secondary | ICD-10-CM

## 2015-09-24 DIAGNOSIS — Z809 Family history of malignant neoplasm, unspecified: Secondary | ICD-10-CM | POA: Insufficient documentation

## 2015-09-24 DIAGNOSIS — C187 Malignant neoplasm of sigmoid colon: Secondary | ICD-10-CM | POA: Diagnosis not present

## 2015-09-24 DIAGNOSIS — E041 Nontoxic single thyroid nodule: Secondary | ICD-10-CM | POA: Insufficient documentation

## 2015-09-24 DIAGNOSIS — I10 Essential (primary) hypertension: Secondary | ICD-10-CM | POA: Insufficient documentation

## 2015-09-24 DIAGNOSIS — R222 Localized swelling, mass and lump, trunk: Secondary | ICD-10-CM

## 2015-09-24 DIAGNOSIS — C189 Malignant neoplasm of colon, unspecified: Secondary | ICD-10-CM

## 2015-09-24 DIAGNOSIS — K5900 Constipation, unspecified: Secondary | ICD-10-CM | POA: Insufficient documentation

## 2015-09-24 DIAGNOSIS — G629 Polyneuropathy, unspecified: Secondary | ICD-10-CM

## 2015-09-24 DIAGNOSIS — Z5111 Encounter for antineoplastic chemotherapy: Secondary | ICD-10-CM | POA: Insufficient documentation

## 2015-09-24 DIAGNOSIS — E119 Type 2 diabetes mellitus without complications: Secondary | ICD-10-CM | POA: Insufficient documentation

## 2015-09-24 LAB — CBC WITH DIFFERENTIAL/PLATELET
Basophils Absolute: 0 10*3/uL (ref 0–0.1)
Basophils Relative: 1 %
Eosinophils Absolute: 0.5 10*3/uL (ref 0–0.7)
Eosinophils Relative: 6 %
HCT: 34 % — ABNORMAL LOW (ref 35.0–47.0)
Hemoglobin: 11.5 g/dL — ABNORMAL LOW (ref 12.0–16.0)
Lymphocytes Relative: 16 %
Lymphs Abs: 1.2 10*3/uL (ref 1.0–3.6)
MCH: 29.9 pg (ref 26.0–34.0)
MCHC: 33.7 g/dL (ref 32.0–36.0)
MCV: 88.8 fL (ref 80.0–100.0)
Monocytes Absolute: 0.5 10*3/uL (ref 0.2–0.9)
Monocytes Relative: 7 %
Neutro Abs: 5.3 10*3/uL (ref 1.4–6.5)
Neutrophils Relative %: 70 %
Platelets: 265 10*3/uL (ref 150–440)
RBC: 3.83 MIL/uL (ref 3.80–5.20)
RDW: 16.9 % — ABNORMAL HIGH (ref 11.5–14.5)
WBC: 7.5 10*3/uL (ref 3.6–11.0)

## 2015-09-24 LAB — MAGNESIUM: Magnesium: 1.8 mg/dL (ref 1.7–2.4)

## 2015-09-24 LAB — COMPREHENSIVE METABOLIC PANEL
ALT: 11 U/L — ABNORMAL LOW (ref 14–54)
AST: 23 U/L (ref 15–41)
Albumin: 3.8 g/dL (ref 3.5–5.0)
Alkaline Phosphatase: 97 U/L (ref 38–126)
Anion gap: 8 (ref 5–15)
BUN: 15 mg/dL (ref 6–20)
CO2: 23 mmol/L (ref 22–32)
Calcium: 9.8 mg/dL (ref 8.9–10.3)
Chloride: 109 mmol/L (ref 101–111)
Creatinine, Ser: 0.87 mg/dL (ref 0.44–1.00)
GFR calc Af Amer: >60 mL/min (ref >60)
GFR calc non Af Amer: >60 mL/min (ref >60)
Glucose, Bld: 175 mg/dL — ABNORMAL HIGH (ref 65–99)
Potassium: 4 mmol/L (ref 3.5–5.1)
Sodium: 140 mmol/L (ref 135–145)
Total Bilirubin: 0.8 mg/dL (ref 0.3–1.2)
Total Protein: 7 g/dL (ref 6.5–8.1)

## 2015-09-24 MED ORDER — FLUOROURACIL CHEMO INJECTION 5 GM/100ML
2400.0000 mg/m2 | INTRAVENOUS | Status: DC
  Administered 2015-09-24: 4200 mg via INTRAVENOUS
  Filled 2015-09-24: qty 84

## 2015-09-24 MED ORDER — FLUOROURACIL CHEMO INJECTION 2.5 GM/50ML
400.0000 mg/m2 | Freq: Once | INTRAVENOUS | Status: AC
  Administered 2015-09-24: 700 mg via INTRAVENOUS
  Filled 2015-09-24: qty 14

## 2015-09-24 MED ORDER — SODIUM CHLORIDE 0.9 % IV SOLN: 10.0000 mg | Freq: Once | INTRAVENOUS | Status: DC

## 2015-09-24 MED ORDER — LEUCOVORIN CALCIUM INJECTION 100 MG
20.0000 mg/m2 | Freq: Once | INTRAMUSCULAR | Status: AC
  Administered 2015-09-24: 36 mg via INTRAVENOUS
  Filled 2015-09-24: qty 1.8

## 2015-09-24 MED ORDER — SODIUM CHLORIDE 0.9 % IV SOLN
Freq: Once | INTRAVENOUS | Status: AC
  Administered 2015-09-24: 11:00:00 via INTRAVENOUS
  Filled 2015-09-24: qty 1000

## 2015-09-24 MED ORDER — SODIUM CHLORIDE 0.9 % IV SOLN
Freq: Once | INTRAVENOUS | Status: AC
  Filled 2015-09-24: qty 4

## 2015-09-24 MED ORDER — LEUCOVORIN CALCIUM INJECTION 350 MG: 400.0000 mg/m2 | Freq: Once | INTRAVENOUS | Status: DC

## 2015-09-24 MED ORDER — SODIUM CHLORIDE 0.9 % IV SOLN
Freq: Once | INTRAVENOUS | Status: AC
  Administered 2015-09-24: 11:00:00 via INTRAVENOUS
  Filled 2015-09-24: qty 4

## 2015-09-24 NOTE — Progress Notes (Signed)
[facility]-  Cancer Center  Clinic day:  09/24/2015   Chief Complaint: Sandy [name] is a 70 y.o. female with stage IV colon cancer who is seen for review of interval thyroid biopsy and lower endoscopy and biopsy prior to continuation of chemotherapy.  HPI: The patient was last seen in the medical oncology clinic on 08/26/2015.  At that time, she was more weak.  She had occasional left side and epigastric pain.  Constipation was well managed.  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  Her daughter was concerned about depression (patient denied).  CEA was increasing.  PET scan from 08/21/2015 revealed improvement in the multifocal hepatic metastatic disease. There was residual hypermetabolic tumor.  The primary colonic lesion at the junction of the descending and sigmoid colon had decreased in size and metabolic activity.  There was persistent hypermetabolic activity in the lower right neck, possibly associated with the thyroid gland. Metastatic disease to a right supraclavicular lymph node was a consideration. Thyroid ultrasound and biopsy was recommended.  PET scan and CT were reviewed with radiology.  Abdomen and pelvic MRI from [facility] was requested and reviewed by radiology.  It was felt that her disease was stable to improved.  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  Sample has been sent for Afirma testing.  Flexible sigmoidoscopy by Dr. Darren Wohl on 09/02/2015 revealed a partially obstructed large mass in the sigmoid colon.  Pathology revealed adenocarcinoma.  KRAS and NRAS mutational analysis was incomplete.  KRAS results from analysis of codons 59, 61, and 117 were negative.  Results were not obtained for codons 12, 13, and 146.  NRAS gene results from analysis of codons 12 and 13 were negative.  Results were not obtained for codons 59, 69, 61, 117, and 146.   During  the interim, she denies any new complaint.  She has some constipation which is well managed.  She tripped and fell in the bathroom 2 weeks ago.  She denies any balance or coordination issues.  She is eating well.  Weight is up 5 pounds.    She is being followed in the outpatient department by [facility].  She did not follow-up with Dr. Hong Kim at [facility].  She has an appointment with Dr. Burns tomorrow.   Past Medical History  Diagnosis Date  . Hypertension   . Constipation   . Insomnia   . Diabetes mellitus without complication (HCC)   . Cancer (HCC)     liver mets; colon cancer  . 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 HOH (hard of hearing)   . Wears dentures     partial upper and lower    Past Surgical History  Procedure Laterality Date  . Colonoscopy with propofol N/A 09/07/2014    Procedure: COLONOSCOPY WITH PROPOFOL;  Surgeon: Darren Wohl, MD;  Location: [facility] ENDOSCOPY;  Service: Endoscopy;  Laterality: N/A;  . Esophagogastroduodenoscopy N/A 09/07/2014    Procedure: ESOPHAGOGASTRODUODENOSCOPY (EGD);  Surgeon: Darren Wohl, MD;  Location: [facility] ENDOSCOPY;  Service: Endoscopy;  Laterality: N/A;  . Portacath placement Left 09/24/2014    Procedure: INSERTION PORT-A-CATH;  Surgeon: Jeffrey W Byrnett, MD;  Location: [facility] ORS;  Service: General;  Laterality: Left;  . Liver biopsy    . Cardiac catheterization    . Flexible sigmoidoscopy N/A 09/02/2015    Procedure: FLEXIBLE SIGMOIDOSCOPY;  Surgeon: Darren   Allen Norris, MD;  Location: [facility];  Service: Endoscopy;  Laterality: N/A;  Diabetic - oral meds Pt has port-a-cath    Family History  Problem Relation Age of Onset  . Cancer Sister     Social History:  reports that she quit smoking about 2 years ago. Her smoking use included Cigarettes. She has a 5 pack-year smoking history. She has never used smokeless tobacco. She reports that she does not drink alcohol or use illicit drugs.  The patient is accompanied by her  daughter.   Allergies: No Known Allergies  Current Medications: Current Outpatient Prescriptions  Medication Sig Dispense Refill  . amLODipine (NORVASC) 5 MG tablet Take 5 mg by mouth daily. Reported on 09/24/2015    . aspirin EC 81 MG tablet Take 81 mg by mouth daily. Reported on 08/26/2015    . atorvastatin (LIPITOR) 40 MG tablet Take 40 mg by mouth daily.    Marland Kitchen gabapentin (NEURONTIN) 100 MG capsule Take 2 capsules (200 mg total) by mouth at bedtime. 1 tab daily and 2 tabs at bedtime. 60 capsule 0  . lidocaine-prilocaine (EMLA) cream Apply 1 application topically as needed. Apply to port 1 hour prior to chemotherapy appointment. Cover with plastic wrap. 30 g 1  . lisinopril (PRINIVIL,ZESTRIL) 10 MG tablet Take 10 mg by mouth daily.    Marland Kitchen loperamide (IMODIUM A-D) 2 MG tablet Take 2 at diarrhea onset , then 1 every 2hr until 12hrs with no BM. May take 2 every 4hrs at night. If diarrhea recurs repeat. 100 tablet 1  . magnesium 30 MG tablet Take 30 mg by mouth daily.     . megestrol (MEGACE) 400 MG/10ML suspension Take 5 mLs (200 mg total) by mouth daily. to stimulate appetite 240 mL 0  . metoprolol (LOPRESSOR) 100 MG tablet Take 100 mg by mouth.    . Multiple Vitamin (MULTIVITAMIN WITH MINERALS) TABS tablet Take 1 tablet by mouth daily.    . ondansetron (ZOFRAN) 4 MG tablet Take 1 tablet (4 mg total) by mouth every 8 (eight) hours as needed for nausea or vomiting. 30 tablet 2  . oxyCODONE-acetaminophen (PERCOCET/ROXICET) 5-325 MG tablet Take 1 tablet by mouth every 6 (six) hours as needed for severe pain. 40 tablet 0  . potassium chloride (K-DUR,KLOR-CON) 10 MEQ tablet Take 1 tablet (10 mEq total) by mouth once. Take 1 pill twice a day for 3 days then 1 pill a day. 30 tablet 0  . sorbitol 70 % solution Take 30 mLs by mouth daily as needed.    . metFORMIN (GLUCOPHAGE) 1000 MG tablet Take 1,000 mg by mouth 2 (two) times daily.      No current facility-administered medications for this visit.    Facility-Administered Medications Ordered in Other Visits  Medication Dose Route Frequency Provider Last Rate Last Dose  . acetaminophen (TYLENOL) tablet 650 mg  650 mg Oral Once Lequita Asal, MD   650 mg at 10/19/14 2446  . sodium chloride 0.9 % injection 10 mL  10 mL Intracatheter PRN Lequita Asal, MD   10 mL at 10/11/14 1431  . sodium chloride 0.9 % injection 10 mL  10 mL Intracatheter PRN Lequita Asal, MD   10 mL at 02/28/15 1529    Review of Systems:  GENERAL: Feels good.  No fevers or sweats.  Weight up 5 pounds. PERFORMANCE STATUS (ECOG): 2. HEENT: Wears a hearing aide. No visual changes, runny nose, sore throat, mouth sores or tenderness. Lungs: No shortness of  breath or cough. No hemoptysis. Cardiac: No chest pain, palpitations, orthopnea, or PND. GI:  Eating well.  Constipation at times.  No nausea, vomiting, diarrhea, melena or hematochezia. GU: Wears a Depends pad.  No dysuria or hematuria. Musculoskeletal: Denies back pain. No joint pain. No muscle tenderness. Extremities: No pain or swelling. Skin: No rashes, skin lesions, or ulcers Neuro:  Neuropathy in fingers and bottom of feet (stable).  No headache, numbness or weakness, or balance issues. Endocrine: Diabetes.  No thyroid issues, hot flashes or night sweats. Psych: Patient states that she is seeing Dr. Burns about depression tomorrow.  No mood changes or anxiety. Pain: Takes oxycodone prn. Review of systems: All other systems reviewed and found to be negative.  Physical Exam: Blood pressure 126/84, pulse 56, temperature 95.7 F (35.4 C), temperature source Tympanic, resp. rate 18, height 5' 5" (1.651 m), weight 148 lb 9.4 oz (67.4 kg). GENERAL: Elderly woman sitting comfortably in a wheelchair in the exam room in no acute distress. MENTAL STATUS: Alert and oriented to person, place and time (2017). HEAD: Styled curly brown hair. Normocephalic, atraumatic, face symmetric, no  Cushingoid features. EYES: Black rimmed glasses. Hazel/green eyes. Pupils equal round and reactive to light and accomodation. No conjunctivitis or scleral icterus. ENT: Oropharynx clear without lesion. Partial. Tongue normal. Mucous membranes moist.  RESPIRATORY: Clear to auscultation without rales, wheezes or rhonchi. CARDIOVASCULAR: Regular rate and rhythm without murmur, rub or gallop. ABDOMEN: Soft, non-tender with active bowel sounds. No guarding or rebound tenderness. No hepatosplenomegaly. SKIN: No rashes, ulcer or skin lesions.  EXTREMITIES: No edema, no skin discoloration or tenderness. No palpable cords. LYMPH NODES: Right low anterior cervical 2-3 cm soft mass s/p biopsy.  No palpable supraclavicular, axillary or inguinal adenopathy  NEUROLOGICAL: Appropriate. PSYCH:  Appropriate.  Affect normal (smiling and engaging).   Appointment on 09/24/2015  Component Date Value Ref Range Status  . WBC 09/24/2015 7.5  3.6 - 11.0 K/uL Final  . RBC 09/24/2015 3.83  3.80 - 5.20 MIL/uL Final  . Hemoglobin 09/24/2015 11.5* 12.0 - 16.0 g/dL Final  . HCT 09/24/2015 34.0* 35.0 - 47.0 % Final  . MCV 09/24/2015 88.8  80.0 - 100.0 fL Final  . MCH 09/24/2015 29.9  26.0 - 34.0 pg Final  . MCHC 09/24/2015 33.7  32.0 - 36.0 g/dL Final  . RDW 09/24/2015 16.9* 11.5 - 14.5 % Final  . Platelets 09/24/2015 265  150 - 440 K/uL Final  . Neutrophils Relative % 09/24/2015 70   Final  . Neutro Abs 09/24/2015 5.3  1.4 - 6.5 K/uL Final  . Lymphocytes Relative 09/24/2015 16   Final  . Lymphs Abs 09/24/2015 1.2  1.0 - 3.6 K/uL Final  . Monocytes Relative 09/24/2015 7   Final  . Monocytes Absolute 09/24/2015 0.5  0.2 - 0.9 K/uL Final  . Eosinophils Relative 09/24/2015 6   Final  . Eosinophils Absolute 09/24/2015 0.5  0 - 0.7 K/uL Final  . Basophils Relative 09/24/2015 1   Final  . Basophils Absolute 09/24/2015 0.0  0 - 0.1 K/uL Final  . Sodium 09/24/2015 140  135 - 145 mmol/L Final  . Potassium  09/24/2015 4.0  3.5 - 5.1 mmol/L Final  . Chloride 09/24/2015 109  101 - 111 mmol/L Final  . CO2 09/24/2015 23  22 - 32 mmol/L Final  . Glucose, Bld 09/24/2015 175* 65 - 99 mg/dL Final  . BUN 09/24/2015 15  6 - 20 mg/dL Final  . Creatinine, Ser 09/24/2015 0.87    0.44 - 1.00 mg/dL Final  . Calcium 09/24/2015 9.8  8.9 - 10.3 mg/dL Final  . Total Protein 09/24/2015 7.0  6.5 - 8.1 g/dL Final  . Albumin 09/24/2015 3.8  3.5 - 5.0 g/dL Final  . AST 09/24/2015 23  15 - 41 U/L Final  . ALT 09/24/2015 11* 14 - 54 U/L Final  . Alkaline Phosphatase 09/24/2015 97  38 - 126 U/L Final  . Total Bilirubin 09/24/2015 0.8  0.3 - 1.2 mg/dL Final  . GFR calc non Af Amer 09/24/2015 >60  >60 mL/min Final  . GFR calc Af Amer 09/24/2015 >60  >60 mL/min Final   Comment: (NOTE) The eGFR has been calculated using the CKD EPI equation. This calculation has not been validated in all clinical situations. eGFR's persistently <60 mL/min signify possible Chronic Kidney Disease.   . Anion gap 09/24/2015 8  5 - 15 Final  . Magnesium 09/24/2015 1.8  1.7 - 2.4 mg/dL Final    Assessment:  Sulay M Delsignore is a 70 y.o. female with metastatic colon cancer. She presented with a 53 pound weight loss in 6 months. Colonoscopy on 09/07/2014 revealed a circumferential partially obstructive fungating mass in the descending colon. There was no active bleeding. There was a 1.5 cm pedunculated polyp in the sigmoid colon (tubulovillous adenoma). Pathology from the descending colon mass revealed fragments of adenocarcinoma. There was not enough tissue for KRAS testing.  CEA was 11,793 on 09/05/2014.  KRAS and NRAS mutational analysis on 09/02/2015 was incomplete.  KRAS results from analysis of codons 59, 61, and 117 were negative.  Results were not obtained for codons 12, 13, and 146.  NRAS gene results from analysis of codons 12 and 13 were negative.  Results were not obtained for codons 59, 69, 61, 117, and 146.  Chest CT angiogram  on 08/28/2014 revealed no evidence of pulmonary embolism, but multiple large enhancing liver masses (largest 6.4 cm).  PET scan on 10/04/2014 revealed 6.5 x 6.1 cm hypermetabolic mass in the proximal sigmoid colon,  There were  numerous large hypermetabolic hepatic metastases. There were no additional sites of metastatic disease in the neck, chest or skeleton.  In addition, there was a 3.2 x 2.8 cm low-attenuation right thyroid nodule.   She is status post 14 cycles of FOLFOX chemotherapy (10/09/2014 - 04/11/2015).  She declined Avastin.  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    CEA was 10,169 on 10/09/2014 , 12,034 on 0712/2016, 7765 on 11/20/2014, 5154 on 12/04/2014, 1921 on 01/01/2015, 1266 on 01/15/2015, 770.6 on 01/29/2015, 502.7 on 02/12/2015, 368.7 on 02/26/2015, 269 on 03/14/2015, 178.3 on 03/28/2015, 135.9 on 04/11/2015, 116.1 on 05/13/2015, 90.8 on 06/10/2015, 145.9 on 07/08/2015, 300.7 on 08/05/2015, and 296.3 on 08/19/2015.  Abdominal and pelvic CT scan on 12/28/2014 revealed response to therapy.  Liver metastases and the descending/sigmoid mass were smaller.  There was no new disease.  Abdomen and pelvic CT scan on 03/25/2015 revealed interval decrease in size of hepatic metastasis and distal descending colon mass.   Abdominal and pelvic CT scan on 03/25/2015 revealed decreasing hepatic metastasis and distal descending colon mass.  Right hepatic lobe lesion was 2.2 x 2.1 cm.  Left lateral hepatic lobe lesion was 1.9 x 1.5 cm.  There was stable thickening of the adrenal glands.  She is status post 7 cycles of 5-fluorouracil and leucovorin (04/28/2014 - 08/05/2015).  Cycle #4 was held on 06/10/2015 secondary to rectal bleeding.  Abdominal and pelvic MRI on 08/01/2015   revealed multiple hypoenhancing hepatic metastasis in both lobes of the liver (3.3 x 2.2 cm in segment VII, 5.9 x 4.1 cm in segment VIII, and a 2.4 x 1.7 cm caudate lesion).  There was a 4.3 cm length of  focally narrowed sigmoid colon.  There was nodular thickening of the right adrenal gland.  PET scan on 08/21/2015 revealed interval improvement in the multifocal hepatic metastatic disease. There was residual hypermetabolic tumor.  The primary colonic lesion at the junction of the descending and sigmoid colon had decreased in size and metabolic activity.  There was persistent hypermetabolic activity in the lower right neck, possibly associated with the thyroid gland.   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  Sample has been sent for Afirma testing.  Symptomatically, she is back to her baseline.  Exam is stable.  Plan: 1.  Labs today:  CBC with diff, CMP, Mg, CEA. 2.  Review KRAS and NRAS testing.  No mutations seen on testing performed. 3.  Review thyroid biopsy.  Await follow-up Afirma testing.  No intervention planned. 4.  Discuss plan for continued 5FU and LV.  If CEA increasing, will schedule follow-up abdominal/pelvic CT scan.  If disease clearly progressing then plan to switch to FOLFIRI +/- Cetuximab.  Side effects re-reviewed. 5.  Cycle #8 5FU and LV today with disconnect in 2 days. 6.  Follow-up with Dr. Ellin Goodie tomorrow as scheduled. 7.  RTC in 2 weeks for MD assessment, labs (CBC with diff, CMP, Mg, CEA), and chemotherapy.   Lequita Asal, MD  09/24/2015, 9:57 AM

## 2015-09-24 NOTE — Progress Notes (Signed)
Pt reports still having constipation with regular use of medication.  Had fall in bathroom per pt about 2 weeks ago, hit her back.  Was trying to get to bathroom fast and tripped per daughter.

## 2015-09-25 ENCOUNTER — Telehealth: Payer: Self-pay | Admitting: *Deleted

## 2015-09-25 ENCOUNTER — Other Ambulatory Visit: Payer: Self-pay | Admitting: Hematology and Oncology

## 2015-09-25 DIAGNOSIS — C787 Secondary malignant neoplasm of liver and intrahepatic bile duct: Secondary | ICD-10-CM

## 2015-09-25 DIAGNOSIS — C187 Malignant neoplasm of sigmoid colon: Secondary | ICD-10-CM

## 2015-09-25 DIAGNOSIS — C189 Malignant neoplasm of colon, unspecified: Secondary | ICD-10-CM

## 2015-09-25 LAB — CEA: CEA: 443 ng/mL — ABNORMAL HIGH (ref 0.0–4.7)

## 2015-09-25 NOTE — Telephone Encounter (Signed)
-----   Message from Lequita Asal, MD sent at 09/25/2015 10:25 AM EDT ----- Regarding: Rising CEA and need for scan  CEA has increased from 296 to 443.  Judeen Hammans,  Can you call patient's daughter?  Lattie Haw,  I put in an order for CT abdomen and pelvis for next Friday 06/23 (Monday 06/26 at the latest as she comes back on Tuesday 06/27 for treatment).  M

## 2015-09-25 NOTE — Telephone Encounter (Signed)
Called daughter and spoke to her about tumor went up and as we spoke about in office that if it went up we would do scan and plan for the whole treatment including erbitux and we can always take off any part of the drugs we don't need.  Daughter was concerned because # is up and I told her last time it was up and the scan ended up being stable so we will just wait and see and hope for the best.  She is agreeable to plan.

## 2015-09-26 ENCOUNTER — Inpatient Hospital Stay: Payer: Medicare Other

## 2015-09-26 VITALS — BP 144/80 | HR 59 | Temp 96.8°F | Resp 20

## 2015-09-26 DIAGNOSIS — C787 Secondary malignant neoplasm of liver and intrahepatic bile duct: Principal | ICD-10-CM

## 2015-09-26 DIAGNOSIS — C189 Malignant neoplasm of colon, unspecified: Secondary | ICD-10-CM

## 2015-09-26 DIAGNOSIS — Z5111 Encounter for antineoplastic chemotherapy: Secondary | ICD-10-CM | POA: Diagnosis not present

## 2015-09-26 LAB — CYTOLOGY - NON PAP

## 2015-09-26 MED ORDER — HEPARIN SOD (PORK) LOCK FLUSH 100 UNIT/ML IV SOLN
500.0000 [IU] | Freq: Once | INTRAVENOUS | Status: AC | PRN
  Administered 2015-09-26: 500 [IU]
  Filled 2015-09-26: qty 5

## 2015-09-26 MED ORDER — SODIUM CHLORIDE 0.9% FLUSH
10.0000 mL | INTRAVENOUS | Status: DC | PRN
  Administered 2015-09-26: 10 mL
  Filled 2015-09-26: qty 10

## 2015-09-27 ENCOUNTER — Encounter: Payer: Self-pay | Admitting: Hematology and Oncology

## 2015-09-30 ENCOUNTER — Other Ambulatory Visit: Payer: Self-pay | Admitting: *Deleted

## 2015-09-30 DIAGNOSIS — C787 Secondary malignant neoplasm of liver and intrahepatic bile duct: Principal | ICD-10-CM

## 2015-09-30 DIAGNOSIS — C189 Malignant neoplasm of colon, unspecified: Secondary | ICD-10-CM

## 2015-09-30 DIAGNOSIS — G893 Neoplasm related pain (acute) (chronic): Secondary | ICD-10-CM

## 2015-09-30 DIAGNOSIS — G629 Polyneuropathy, unspecified: Secondary | ICD-10-CM

## 2015-09-30 MED ORDER — OXYCODONE-ACETAMINOPHEN 5-325 MG PO TABS: 1.0000 | ORAL_TABLET | Freq: Four times a day (QID) | ORAL | Status: DC | PRN

## 2015-10-04 ENCOUNTER — Ambulatory Visit
Admission: RE | Admit: 2015-10-04 | Discharge: 2015-10-04 | Disposition: A | Discharge status: Home / Self Care | Payer: Medicare Other | Source: Ambulatory Visit | Attending: Hematology and Oncology | Admitting: Hematology and Oncology

## 2015-10-04 DIAGNOSIS — C189 Malignant neoplasm of colon, unspecified: Secondary | ICD-10-CM | POA: Diagnosis present

## 2015-10-04 DIAGNOSIS — C187 Malignant neoplasm of sigmoid colon: Secondary | ICD-10-CM | POA: Diagnosis not present

## 2015-10-04 DIAGNOSIS — D259 Leiomyoma of uterus, unspecified: Secondary | ICD-10-CM | POA: Insufficient documentation

## 2015-10-04 DIAGNOSIS — I700 Atherosclerosis of aorta: Secondary | ICD-10-CM | POA: Diagnosis not present

## 2015-10-04 DIAGNOSIS — C787 Secondary malignant neoplasm of liver and intrahepatic bile duct: Secondary | ICD-10-CM | POA: Diagnosis present

## 2015-10-04 MED ORDER — IOPAMIDOL (ISOVUE-300) INJECTION 61%
100.0000 mL | Freq: Once | INTRAVENOUS | Status: AC | PRN
  Administered 2015-10-04: 100 mL via INTRAVENOUS

## 2015-10-07 ENCOUNTER — Emergency Department: Payer: Medicare Other

## 2015-10-07 ENCOUNTER — Other Ambulatory Visit: Payer: Self-pay

## 2015-10-07 ENCOUNTER — Emergency Department
Admission: EM | Admit: 2015-10-07 | Discharge: 2015-10-07 | Disposition: A | Discharge status: Home / Self Care | Payer: Medicare Other | Attending: Emergency Medicine | Admitting: Emergency Medicine

## 2015-10-07 ENCOUNTER — Encounter: Payer: Self-pay | Admitting: Intensive Care

## 2015-10-07 DIAGNOSIS — Z7982 Long term (current) use of aspirin: Secondary | ICD-10-CM | POA: Insufficient documentation

## 2015-10-07 DIAGNOSIS — E119 Type 2 diabetes mellitus without complications: Secondary | ICD-10-CM | POA: Diagnosis not present

## 2015-10-07 DIAGNOSIS — C787 Secondary malignant neoplasm of liver and intrahepatic bile duct: Principal | ICD-10-CM

## 2015-10-07 DIAGNOSIS — Z87891 Personal history of nicotine dependence: Secondary | ICD-10-CM | POA: Diagnosis not present

## 2015-10-07 DIAGNOSIS — Z85038 Personal history of other malignant neoplasm of large intestine: Secondary | ICD-10-CM | POA: Diagnosis not present

## 2015-10-07 DIAGNOSIS — C189 Malignant neoplasm of colon, unspecified: Secondary | ICD-10-CM

## 2015-10-07 DIAGNOSIS — E785 Hyperlipidemia, unspecified: Secondary | ICD-10-CM | POA: Insufficient documentation

## 2015-10-07 DIAGNOSIS — Z79899 Other long term (current) drug therapy: Secondary | ICD-10-CM | POA: Insufficient documentation

## 2015-10-07 DIAGNOSIS — Z7984 Long term (current) use of oral hypoglycemic drugs: Secondary | ICD-10-CM | POA: Diagnosis not present

## 2015-10-07 DIAGNOSIS — R079 Chest pain, unspecified: Secondary | ICD-10-CM | POA: Insufficient documentation

## 2015-10-07 DIAGNOSIS — I10 Essential (primary) hypertension: Secondary | ICD-10-CM | POA: Diagnosis not present

## 2015-10-07 LAB — COMPREHENSIVE METABOLIC PANEL
ALT: 11 U/L — AB (ref 14–54)
ANION GAP: 8 (ref 5–15)
AST: 21 U/L (ref 15–41)
Albumin: 3.4 g/dL — ABNORMAL LOW (ref 3.5–5.0)
Alkaline Phosphatase: 108 U/L (ref 38–126)
BUN: 8 mg/dL (ref 6–20)
CHLORIDE: 108 mmol/L (ref 101–111)
CO2: 25 mmol/L (ref 22–32)
CREATININE: 0.78 mg/dL (ref 0.44–1.00)
Calcium: 9.7 mg/dL (ref 8.9–10.3)
GFR calc non Af Amer: >60 mL/min (ref >60)
Glucose, Bld: 128 mg/dL — ABNORMAL HIGH (ref 65–99)
Potassium: 3.9 mmol/L (ref 3.5–5.1)
SODIUM: 141 mmol/L (ref 135–145)
Total Bilirubin: 0.9 mg/dL (ref 0.3–1.2)
Total Protein: 6.4 g/dL — ABNORMAL LOW (ref 6.5–8.1)

## 2015-10-07 LAB — CBC
HCT: 32.5 % — ABNORMAL LOW (ref 35.0–47.0)
Hemoglobin: 11.1 g/dL — ABNORMAL LOW (ref 12.0–16.0)
MCH: 30.1 pg (ref 26.0–34.0)
MCHC: 34 g/dL (ref 32.0–36.0)
MCV: 88.5 fL (ref 80.0–100.0)
PLATELETS: 204 10*3/uL (ref 150–440)
RBC: 3.67 MIL/uL — AB (ref 3.80–5.20)
RDW: 16.4 % — ABNORMAL HIGH (ref 11.5–14.5)
WBC: 8.1 10*3/uL (ref 3.6–11.0)

## 2015-10-07 LAB — TROPONIN I

## 2015-10-07 MED ORDER — OXYCODONE HCL 5 MG PO TABS
5.0000 mg | ORAL_TABLET | Freq: Once | ORAL | Status: AC
  Administered 2015-10-07: 5 mg via ORAL
  Filled 2015-10-07: qty 1

## 2015-10-07 MED ORDER — IOPAMIDOL (ISOVUE-370) INJECTION 76%
75.0000 mL | Freq: Once | INTRAVENOUS | Status: AC | PRN
  Administered 2015-10-07: 75 mL via INTRAVENOUS

## 2015-10-07 NOTE — Discharge Instructions (Signed)
Please return to the emergency department if you develop chest pain, shortness of breath, palpitations, fainting or lightheadedness, or any other symptoms concerning to you.

## 2015-10-07 NOTE — ED Provider Notes (Signed)
[facility] Emergency Department Provider Note  ____________________________________________  Time seen: Approximately 10:53 AM  I have reviewed the triage vital signs and the nursing notes.   HISTORY  Chief Complaint Chest Pain    HPI Sandy [name] is a 70 y.o. female a history of liver cancer on chemotherapy, HTN, DM presenting with chest pain. The patient reports that at 9 AM she was lying in bed when she had the acute onset of a sharp chest pain in the right chest that was nonpleuritic, did not radiate, and was not associated with diaphoresis, shortness of breath, nausea or vomiting. The pain has improved but continues to be present. She reports that she has similar pains approximately once monthly, usually treats her pain with oxycodone. This pain is similar in character and severity to her previous pain. She denies any cough or cold, fever or chills, lower extremity swelling or calf pain. She does not report any changes associated with food.    Past Medical History  Diagnosis Date  . Hypertension   . Constipation   . Insomnia   . Diabetes mellitus without complication ([place])   . Cancer ([place])     liver mets; colon cancer  . 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 HOH (hard of hearing)   . Wears dentures     partial upper and lower    Patient Active Problem List   Diagnosis Date Noted  . Thyroid nodule 09/24/2015  . Personal history of colon cancer   . Thyroid nodule 08/30/2015  . Malignant neoplasm of sigmoid colon ([place]) 08/27/2015  . Hematochezia 06/10/2015  . Neuropathy ([place]) 04/29/2015  . Muscle pain, lumbar 02/12/2015  . Hypomagnesemia 01/15/2015  . Hypokalemia 10/23/2014  . Hyponatremia 10/21/2014  . Colon cancer ([place]) 09/21/2014  . Metastases to the liver ([place]) 09/21/2014  . Colon cancer metastasized to liver ([place]) 09/18/2014  . Cancer related pain 09/18/2014  . Dysuria 09/18/2014  . Anemia 09/18/2014  .  Anorexia 09/18/2014  . Weight loss, unintentional 09/18/2014  . Benign essential HTN 09/03/2014  . Type 2 diabetes mellitus ([place]) 09/03/2014  . Personal history of nicotine dependence 09/03/2014  . Combined fat and carbohydrate induced hyperlipemia 09/03/2014  . Chest pain 08/28/2014    Past Surgical History  Procedure Laterality Date  . Colonoscopy with propofol N/A 09/07/2014    Procedure: COLONOSCOPY WITH PROPOFOL;  Surgeon: Lucilla Lame, MD;  Location: [facility] ENDOSCOPY;  Service: Endoscopy;  Laterality: N/A;  . Esophagogastroduodenoscopy N/A 09/07/2014    Procedure: ESOPHAGOGASTRODUODENOSCOPY (EGD);  Surgeon: Lucilla Lame, MD;  Location: [facility] ENDOSCOPY;  Service: Endoscopy;  Laterality: N/A;  . Portacath placement Left 09/24/2014    Procedure: INSERTION PORT-A-CATH;  Surgeon: Robert Bellow, MD;  Location: [facility] ORS;  Service: General;  Laterality: Left;  . Liver biopsy    . Cardiac catheterization    . Flexible sigmoidoscopy N/A 09/02/2015    Procedure: FLEXIBLE SIGMOIDOSCOPY;  Surgeon: Lucilla Lame, MD;  Location: [place];  Service: Endoscopy;  Laterality: N/A;  Diabetic - oral meds Pt has port-a-cath    Current Outpatient Rx  Name  Route  Sig  Dispense  Refill  . amLODipine (NORVASC) 5 MG tablet   Oral   Take 5 mg by mouth daily. Reported on 09/24/2015         . aspirin EC 81 MG tablet   Oral   Take 81 mg by mouth daily. Reported on 08/26/2015         .  atorvastatin (LIPITOR) 40 MG tablet   Oral   Take 40 mg by mouth daily.         Marland Kitchen gabapentin (NEURONTIN) 100 MG capsule   Oral   Take 2 capsules (200 mg total) by mouth at bedtime. 1 tab daily and 2 tabs at bedtime.   60 capsule   0   . lidocaine-prilocaine (EMLA) cream   Topical   Apply 1 application topically as needed. Apply to port 1 hour prior to chemotherapy appointment. Cover with plastic wrap.   30 g   1   . lisinopril (PRINIVIL,ZESTRIL) 10 MG tablet   Oral   Take 10 mg by mouth daily.          . magnesium 30 MG tablet   Oral   Take 30 mg by mouth daily.          . megestrol (MEGACE) 400 MG/10ML suspension   Oral   Take 5 mLs (200 mg total) by mouth daily. to stimulate appetite   240 mL   0   . metFORMIN (GLUCOPHAGE) 1000 MG tablet   Oral   Take 1,000 mg by mouth 2 (two) times daily.          . metoprolol (LOPRESSOR) 100 MG tablet   Oral   Take 100 mg by mouth.         . Multiple Vitamin (MULTIVITAMIN WITH MINERALS) TABS tablet   Oral   Take 1 tablet by mouth daily.         . ondansetron (ZOFRAN) 4 MG tablet   Oral   Take 1 tablet (4 mg total) by mouth every 8 (eight) hours as needed for nausea or vomiting.   30 tablet   2   . oxyCODONE-acetaminophen (PERCOCET/ROXICET) 5-325 MG tablet   Oral   Take 1 tablet by mouth every 6 (six) hours as needed for severe pain.   40 tablet   0   . potassium chloride (K-DUR,KLOR-CON) 10 MEQ tablet   Oral   Take 1 tablet (10 mEq total) by mouth once. Take 1 pill twice a day for 3 days then 1 pill a day.   30 tablet   0   . sorbitol 70 % solution   Oral   Take 30 mLs by mouth daily as needed.           Allergies Review of patient's allergies indicates no known allergies.  Family History  Problem Relation Age of Onset  . Cancer Sister     Social History Social History  Substance Use Topics  . Smoking status: Former Smoker -- 0.25 packs/day for 20 years    Types: Cigarettes    Quit date: 09/06/2013  . Smokeless tobacco: Never Used  . Alcohol Use: No    Review of Systems Constitutional: No fever/chills.No lightheadedness or syncope. Eyes: No visual changes. ENT: No sore throat. No congestion or rhinorrhea. Cardiovascular:  Positive right-sidedchest pain. Denies palpitations. Respiratory: Denies shortness of breath.  No cough. Gastrointestinal: No abdominal pain.  No nausea, no vomiting.  No diarrhea.  No constipation. Genitourinary: Negative for dysuria. Musculoskeletal: Negative for back  pain. Skin: Negative for rash. Neurological: Negative for headaches. No focal numbness, tingling or weakness.   10-point ROS otherwise negative.  ____________________________________________   PHYSICAL EXAM:  VITAL SIGNS: ED Triage Vitals  Enc Vitals Group     BP 10/07/15 0942 170/89 mmHg     Pulse Rate 10/07/15 0942 59     Resp 10/07/15 0942 18  Temp 10/07/15 0942 98.2 F (36.8 C)     Temp Source 10/07/15 0942 Oral     SpO2 10/07/15 0942 100 %     Weight 10/07/15 0942 145 lb (65.772 kg)     Height --      Head Cir --      Peak Flow --      Pain Score 10/07/15 0943 7     Pain Loc --      Pain Edu? --      Excl. in [place]? --     Constitutional: Alert and oriented. Chronically ill appearing but in no acute distress. Answers questions appropriately. Eyes: Conjunctivae are normal.  EOMI. No scleral icterus. Head: Atraumatic. Nose: No congestion/rhinnorhea. Mouth/Throat: Mucous membranes are moist.  Neck: No stridor.  Supple.  No JVD. Cardiovascular: Normal rate, regular rhythm. No murmurs, rubs or gallops.  Respiratory: Normal respiratory effort.  No accessory muscle use or retractions. Lungs CTAB.  No wheezes, rales or ronchi. Gastrointestinal: Soft, nontender and nondistended.  No guarding or rebound.  No peritoneal signs. Musculoskeletal: No LE edema. No ttp in the calves or palpable cords.  Negative Homan's sign. Neurologic:  A&Ox3.  Speech is clear.  Face and smile are symmetric.  EOMI.  Moves all extremities well. Skin:  Skin is warm, dry and intact. No rash noted. Psychiatric: Mood and affect are normal. Speech and behavior are normal.  Normal judgement.  ____________________________________________   LABS (all labs ordered are listed, but only abnormal results are displayed)  Labs Reviewed  CBC - Abnormal; Notable for the following:    RBC 3.67 (*)    Hemoglobin 11.1 (*)    HCT 32.5 (*)    RDW 16.4 (*)    All other components within normal limits   COMPREHENSIVE METABOLIC PANEL - Abnormal; Notable for the following:    Glucose, Bld 128 (*)    Total Protein 6.4 (*)    Albumin 3.4 (*)    ALT 11 (*)    All other components within normal limits  TROPONIN I  TROPONIN I   ____________________________________________  EKG  ED ECG REPORT I, Eula Listen, the attending physician, personally viewed and interpreted this ECG.   Date: 10/07/2015  EKG Time: 934  Rate: 63  Rhythm: normal sinus rhythm  Axis: Normal  Intervals:none  ST&T Change: Nonspecific T-wave inversions in V4, V5 and V6. No ST elevation.  ____________________________________________  RADIOLOGY  Dg Chest 2 View  10/07/2015  CLINICAL DATA:  Left side chest pain this morning. EXAM: CHEST  2 VIEW COMPARISON:  10/17/2014 FINDINGS: The heart size and mediastinal contours are within normal limits. Both lungs are clear. The visualized skeletal structures are unremarkable. IMPRESSION: Cardiomegaly.  Low lung volumes. Electronically Signed   By: Rolm Baptise M.D.   On: 10/07/2015 10:20   Ct Angio Chest Pe W/cm &/or Wo Cm  10/07/2015  CLINICAL DATA:  Chest pain in left-sided chest since this morning. Metastatic colon cancer. EXAM: CT ANGIOGRAPHY CHEST WITH CONTRAST TECHNIQUE: Multidetector CT imaging of the chest was performed using the standard protocol during bolus administration of intravenous contrast. Multiplanar CT image reconstructions and MIPs were obtained to evaluate the vascular anatomy. CONTRAST:  75 cc Isovue 370 IV COMPARISON:  Or PET CT 08/21/2015 FINDINGS: Mediastinum/Nodes: Hypoechoic mass within the right thyroid lobe measures up to 3.2 cm. Small scattered mediastinal lymph nodes, none pathologically enlarged. No mediastinal, hilar, or axillary adenopathy. Coronary artery calcifications in the left anterior descending coronary artery. Heart is  borderline in size. Aorta is normal caliber. No evidence of pulmonary embolus. Lungs/Pleura: Lungs are clear. No  focal airspace opacities or suspicious nodules. No effusions. Upper abdomen: Multiple calcified masses within the liver again noted as seen on prior PET CT, not significantly changed. No acute findings in the upper abdomen. Musculoskeletal: Chest wall soft tissues are unremarkable. Left Port-A-Cath remains in place, unchanged. No acute bony abnormality or focal bone lesion. Review of the MIP images confirms the above findings. IMPRESSION: No evidence of pulmonary embolus. Coronary artery disease. Stable right neck mass, likely thyroid nodule. Stable calcified lesions within the liver. Electronically Signed   By: Rolm Baptise M.D.   On: 10/07/2015 12:06    ____________________________________________   PROCEDURES  Procedure(s) performed: None  Critical Care performed: No ____________________________________________   INITIAL IMPRESSION / ASSESSMENT AND PLAN / ED COURSE  Pertinent labs & imaging results that were available during my care of the patient were reviewed by me and considered in my medical decision making (see chart for details).  70 y.o. female w/ HTN, DM 2, and on chemotherapy for liver cancer presenting with monthly episodes of right-sided chest pain. Given the patient's cancer history, I will get a CT angiogram to rule out PE. I would also consider either bony or soft tissue metastases as a possible cause. GI associated cause is possible but less probable. It is possible that we will not find any acute causes for this patient's chest pain today. I will plan to treat her symptomatically and reevaluate her after her diagnostic workup is complete.  ----------------------------------------- 12:59 PM on 10/07/2015 -----------------------------------------  The patient is currently asymptomatic. Her pain completely resolved with a dose of oxycodone. Her workup in the emergency department is reassuring. Her CT scan does not show pulmonary embolus or any other acute findings. Her EKG  does not show ischemic changes and she has a negative troponin. I will get a second troponin, and if this is negative plan to discharge the patient home.  ____________________________________________  FINAL CLINICAL IMPRESSION(S) / ED DIAGNOSES  Final diagnoses:  Right-sided chest pain      NEW MEDICATIONS STARTED DURING THIS VISIT:  Discharge Medication List as of 10/07/2015  2:11 PM       Eula Listen, MD 10/07/15 1536

## 2015-10-07 NOTE — ED Notes (Addendum)
Pt arrived by EMS from home c/o chest pain. PT states "chest pain started about 0500 on L side of chest" HX stage 4 liver cancer found in colon and currently receiving chemo treatments. Patient has been having chemo for around a year. Pt took 81mg  Aspirin today. EMS vitals: Blood sugar 108, b/p 128/76, 68 pulse. EMS reported patient ambulated with assistance with some stumbling.

## 2015-10-07 NOTE — ED Notes (Signed)
Pt back from CT

## 2015-10-08 ENCOUNTER — Other Ambulatory Visit: Payer: Self-pay | Admitting: Nurse Practitioner

## 2015-10-08 ENCOUNTER — Inpatient Hospital Stay: Payer: Medicare Other

## 2015-10-08 ENCOUNTER — Other Ambulatory Visit: Payer: Self-pay | Admitting: Hematology and Oncology

## 2015-10-08 ENCOUNTER — Inpatient Hospital Stay (HOSPITAL_BASED_OUTPATIENT_CLINIC_OR_DEPARTMENT_OTHER): Payer: Medicare Other | Admitting: Hematology and Oncology

## 2015-10-08 ENCOUNTER — Encounter: Payer: Self-pay | Admitting: Hematology and Oncology

## 2015-10-08 VITALS — BP 114/77 | HR 56 | Resp 20

## 2015-10-08 VITALS — BP 145/91 | HR 63 | Temp 96.4°F | Resp 63 | Ht 65.0 in | Wt 149.1 lb

## 2015-10-08 DIAGNOSIS — G4730 Sleep apnea, unspecified: Secondary | ICD-10-CM

## 2015-10-08 DIAGNOSIS — C787 Secondary malignant neoplasm of liver and intrahepatic bile duct: Principal | ICD-10-CM

## 2015-10-08 DIAGNOSIS — R5383 Other fatigue: Secondary | ICD-10-CM

## 2015-10-08 DIAGNOSIS — E041 Nontoxic single thyroid nodule: Secondary | ICD-10-CM

## 2015-10-08 DIAGNOSIS — C189 Malignant neoplasm of colon, unspecified: Secondary | ICD-10-CM

## 2015-10-08 DIAGNOSIS — C187 Malignant neoplasm of sigmoid colon: Secondary | ICD-10-CM

## 2015-10-08 DIAGNOSIS — Z809 Family history of malignant neoplasm, unspecified: Secondary | ICD-10-CM

## 2015-10-08 DIAGNOSIS — E119 Type 2 diabetes mellitus without complications: Secondary | ICD-10-CM

## 2015-10-08 DIAGNOSIS — G629 Polyneuropathy, unspecified: Secondary | ICD-10-CM | POA: Diagnosis not present

## 2015-10-08 DIAGNOSIS — Z79899 Other long term (current) drug therapy: Secondary | ICD-10-CM

## 2015-10-08 DIAGNOSIS — R109 Unspecified abdominal pain: Secondary | ICD-10-CM

## 2015-10-08 DIAGNOSIS — F17210 Nicotine dependence, cigarettes, uncomplicated: Secondary | ICD-10-CM

## 2015-10-08 DIAGNOSIS — Z7982 Long term (current) use of aspirin: Secondary | ICD-10-CM

## 2015-10-08 DIAGNOSIS — K5900 Constipation, unspecified: Secondary | ICD-10-CM

## 2015-10-08 DIAGNOSIS — I10 Essential (primary) hypertension: Secondary | ICD-10-CM

## 2015-10-08 DIAGNOSIS — Z5111 Encounter for antineoplastic chemotherapy: Secondary | ICD-10-CM | POA: Diagnosis not present

## 2015-10-08 LAB — CBC WITH DIFFERENTIAL/PLATELET
Basophils Absolute: 0 10*3/uL (ref 0–0.1)
Basophils Relative: 0 %
Eosinophils Absolute: 0.2 10*3/uL (ref 0–0.7)
Eosinophils Relative: 3 %
HCT: 32.8 % — ABNORMAL LOW (ref 35.0–47.0)
Hemoglobin: 11 g/dL — ABNORMAL LOW (ref 12.0–16.0)
Lymphocytes Relative: 17 %
Lymphs Abs: 1.3 10*3/uL (ref 1.0–3.6)
MCH: 29.7 pg (ref 26.0–34.0)
MCHC: 33.7 g/dL (ref 32.0–36.0)
MCV: 88.3 fL (ref 80.0–100.0)
Monocytes Absolute: 0.5 10*3/uL (ref 0.2–0.9)
Monocytes Relative: 7 %
Neutro Abs: 5.9 10*3/uL (ref 1.4–6.5)
Neutrophils Relative %: 73 %
Platelets: 205 10*3/uL (ref 150–440)
RBC: 3.72 MIL/uL — ABNORMAL LOW (ref 3.80–5.20)
RDW: 16.8 % — ABNORMAL HIGH (ref 11.5–14.5)
WBC: 8 10*3/uL (ref 3.6–11.0)

## 2015-10-08 LAB — COMPREHENSIVE METABOLIC PANEL
ALT: 11 U/L — ABNORMAL LOW (ref 14–54)
AST: 22 U/L (ref 15–41)
Albumin: 3.4 g/dL — ABNORMAL LOW (ref 3.5–5.0)
Alkaline Phosphatase: 112 U/L (ref 38–126)
Anion gap: 9 (ref 5–15)
BUN: 12 mg/dL (ref 6–20)
CO2: 23 mmol/L (ref 22–32)
Calcium: 9.5 mg/dL (ref 8.9–10.3)
Chloride: 108 mmol/L (ref 101–111)
Creatinine, Ser: 0.75 mg/dL (ref 0.44–1.00)
GFR calc Af Amer: >60 mL/min (ref >60)
GFR calc non Af Amer: >60 mL/min (ref >60)
Glucose, Bld: 130 mg/dL — ABNORMAL HIGH (ref 65–99)
Potassium: 4.3 mmol/L (ref 3.5–5.1)
Sodium: 140 mmol/L (ref 135–145)
Total Bilirubin: 0.6 mg/dL (ref 0.3–1.2)
Total Protein: 6.6 g/dL (ref 6.5–8.1)

## 2015-10-08 LAB — MAGNESIUM: Magnesium: 2.1 mg/dL (ref 1.7–2.4)

## 2015-10-08 MED ORDER — DEXAMETHASONE SODIUM PHOSPHATE 100 MG/10ML IJ SOLN
10.0000 mg | Freq: Once | INTRAMUSCULAR | Status: AC
  Administered 2015-10-08: 10 mg via INTRAVENOUS
  Filled 2015-10-08: qty 1

## 2015-10-08 MED ORDER — LEUCOVORIN CALCIUM INJECTION 350 MG
400.0000 mg/m2 | Freq: Once | INTRAVENOUS | Status: AC
  Administered 2015-10-08: 700 mg via INTRAVENOUS
  Filled 2015-10-08: qty 35

## 2015-10-08 MED ORDER — PALONOSETRON HCL INJECTION 0.25 MG/5ML
0.2500 mg | Freq: Once | INTRAVENOUS | Status: AC
  Administered 2015-10-08: 0.25 mg via INTRAVENOUS
  Filled 2015-10-08: qty 5

## 2015-10-08 MED ORDER — SODIUM CHLORIDE 0.9 % IV SOLN
Freq: Once | INTRAVENOUS | Status: AC
  Administered 2015-10-08: 10:00:00 via INTRAVENOUS
  Filled 2015-10-08: qty 1000

## 2015-10-08 MED ORDER — IRINOTECAN HCL CHEMO INJECTION 100 MG/5ML
125.0000 mg/m2 | Freq: Once | INTRAVENOUS | Status: AC
  Administered 2015-10-08: 218 mg via INTRAVENOUS
  Filled 2015-10-08: qty 10.9

## 2015-10-08 MED ORDER — ATROPINE SULFATE 1 MG/ML IJ SOLN
0.5000 mg | Freq: Once | INTRAMUSCULAR | Status: AC | PRN
  Administered 2015-10-08: 0.5 mg via INTRAVENOUS
  Filled 2015-10-08: qty 1

## 2015-10-08 MED ORDER — FLUOROURACIL CHEMO INJECTION 2.5 GM/50ML
400.0000 mg/m2 | Freq: Once | INTRAVENOUS | Status: AC
  Administered 2015-10-08: 700 mg via INTRAVENOUS
  Filled 2015-10-08: qty 14

## 2015-10-08 MED ORDER — SODIUM CHLORIDE 0.9 % IV SOLN
2400.0000 mg/m2 | INTRAVENOUS | Status: DC
  Administered 2015-10-08: 4200 mg via INTRAVENOUS
  Filled 2015-10-08: qty 84

## 2015-10-08 MED ORDER — HEPARIN SOD (PORK) LOCK FLUSH 100 UNIT/ML IV SOLN: 500.0000 [IU] | Freq: Once | INTRAVENOUS | Status: DC | PRN

## 2015-10-08 NOTE — Progress Notes (Signed)
No changes since last visit other than seen in ER for chest pain

## 2015-10-08 NOTE — Progress Notes (Signed)
[facility] day:  10/08/2015   Chief Complaint: Sandy [name] is a 70 y.o. female with stage IV colon cancer who is seen for review of interval CT scans and assessment prior to cycle #1 FOLFIRI chemotherapy.  HPI: The patient was last seen in the medical oncology clinic on 09/24/2015.  At that time, she received 5-fluorouracil and leucovorin.  CEA after her appointment had dramatically increased (443.0).  Abdominal and pelvic CT scan was scheduled.  Abdominal and pelvic CT scan on 10/04/2015 revealed increased bulkiness of soft tissue mass in distal descending colon compared with previous studies, consistent with primary colon carcinoma.  The mass measured 4.7 cm compared to 3.3 cm.  The liver metastasis were felt larger by review of radiology today.  During the interim, she has done well.  She states that she has a bowel movement every other day.  She denies any diarrhea.  She denies any abdominal pain.  She presented to the ER yesterday with chest pain.  Troponin was negative.  Chest CT angiogram revealed no evidence of pulmonary embolism.  She continues to be followed by [employer].  She is receiving physical therapy.   Past Medical History  Diagnosis Date  . Hypertension   . Constipation   . Insomnia   . Diabetes mellitus without complication ([place])   . Cancer ([place])     liver mets; colon cancer  . 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 HOH (hard of hearing)   . Wears dentures     partial upper and lower    Past Surgical History  Procedure Laterality Date  . Colonoscopy with propofol N/A 09/07/2014    Procedure: COLONOSCOPY WITH PROPOFOL;  Surgeon: Lucilla Lame, MD;  Location: [facility] ENDOSCOPY;  Service: Endoscopy;  Laterality: N/A;  . Esophagogastroduodenoscopy N/A 09/07/2014    Procedure: ESOPHAGOGASTRODUODENOSCOPY (EGD);  Surgeon: Lucilla Lame, MD;  Location: [facility] ENDOSCOPY;  Service: Endoscopy;  Laterality: N/A;   . Portacath placement Left 09/24/2014    Procedure: INSERTION PORT-A-CATH;  Surgeon: Robert Bellow, MD;  Location: [facility] ORS;  Service: General;  Laterality: Left;  . Liver biopsy    . Cardiac catheterization    . Flexible sigmoidoscopy N/A 09/02/2015    Procedure: FLEXIBLE SIGMOIDOSCOPY;  Surgeon: Lucilla Lame, MD;  Location: [facility];  Service: Endoscopy;  Laterality: N/A;  Diabetic - oral meds Pt has port-a-cath    Family History  Problem Relation Age of Onset  . Cancer Sister     Social History:  reports that she quit smoking about 2 years ago. Her smoking use included Cigarettes. She has a 5 pack-year smoking history. She has never used smokeless tobacco. She reports that she does not drink alcohol or use illicit drugs.  The patient is accompanied by her son and daughter.   Allergies: No Known Allergies  Current Medications: Current Outpatient Prescriptions  Medication Sig Dispense Refill  . amLODipine (NORVASC) 5 MG tablet Take 5 mg by mouth daily. Reported on 09/24/2015    . aspirin EC 81 MG tablet Take 81 mg by mouth daily. Reported on 08/26/2015    . atorvastatin (LIPITOR) 40 MG tablet Take 40 mg by mouth daily.    Marland Kitchen gabapentin (NEURONTIN) 100 MG capsule Take 2 capsules (200 mg total) by mouth at bedtime. 1 tab daily and 2 tabs at bedtime. 60 capsule 0  . lidocaine-prilocaine (EMLA) cream Apply 1 application topically as needed.  Apply to port 1 hour prior to chemotherapy appointment. Cover with plastic wrap. 30 g 1  . lisinopril (PRINIVIL,ZESTRIL) 10 MG tablet Take 10 mg by mouth daily.    . magnesium 30 MG tablet Take 30 mg by mouth daily.     . megestrol (MEGACE) 400 MG/10ML suspension Take 5 mLs (200 mg total) by mouth daily. to stimulate appetite 240 mL 0  . metFORMIN (GLUCOPHAGE) 1000 MG tablet Take 1,000 mg by mouth 2 (two) times daily.     . metoprolol (LOPRESSOR) 100 MG tablet Take 100 mg by mouth.    . Multiple Vitamin (MULTIVITAMIN WITH MINERALS) TABS  tablet Take 1 tablet by mouth daily.    . ondansetron (ZOFRAN) 4 MG tablet Take 1 tablet (4 mg total) by mouth every 8 (eight) hours as needed for nausea or vomiting. 30 tablet 2  . oxyCODONE-acetaminophen (PERCOCET/ROXICET) 5-325 MG tablet Take 1 tablet by mouth every 6 (six) hours as needed for severe pain. 40 tablet 0  . potassium chloride (K-DUR,KLOR-CON) 10 MEQ tablet Take 1 tablet (10 mEq total) by mouth once. Take 1 pill twice a day for 3 days then 1 pill a day. 30 tablet 0  . sorbitol 70 % solution Take 30 mLs by mouth daily as needed.     No current facility-administered medications for this visit.   Facility-Administered Medications Ordered in Other Visits  Medication Dose Route Frequency Provider Last Rate Last Dose  . acetaminophen (TYLENOL) tablet 650 mg  650 mg Oral Once Lequita Asal, MD   650 mg at 10/19/14 6226  . sodium chloride 0.9 % injection 10 mL  10 mL Intracatheter PRN Lequita Asal, MD   10 mL at 10/11/14 1431  . sodium chloride 0.9 % injection 10 mL  10 mL Intracatheter PRN Lequita Asal, MD   10 mL at 02/28/15 1529    Review of Systems:  GENERAL: Feels pretty good.  No fevers or sweats.  Weight up 1 pound. PERFORMANCE STATUS (ECOG): 2. HEENT: Wears a hearing aide. No visual changes, runny nose, sore throat, mouth sores or tenderness. Lungs: No shortness of breath or cough. No hemoptysis. Cardiac: Chest pain yesterday (seen in ER).  No palpitations, orthopnea, or PND. GI:  Eating well.  Constipation at times.  No nausea, vomiting, diarrhea, melena or hematochezia. GU: Wears a Depends pad.  No dysuria or hematuria. Musculoskeletal: Denies back pain. No joint pain. No muscle tenderness. Extremities: No pain or swelling. Skin: No rashes, skin lesions, or ulcers Neuro:  Neuropathy in fingers and bottom of feet (stable).  No headache, numbness or weakness, or balance issues. Endocrine: Diabetes.  No thyroid issues, hot flashes or night  sweats. Psych: Patient states that she is seeing Dr. Quay Burow about depression tomorrow.  No mood changes or anxiety. Pain: Takes oxycodone prn. Review of systems: All other systems reviewed and found to be negative.  Physical Exam: Blood pressure 145/91, pulse 63, temperature 96.4 F (35.8 C), temperature source Tympanic, resp. rate 63, height 5' 5"  (1.651 m), weight 149 lb 2.3 oz (67.65 kg). GENERAL: Elderly woman sitting comfortably in a wheelchair in the exam room in no acute distress. MENTAL STATUS: Alert and oriented to person, place and time (2017). HEAD: Styled curly brown hair. Normocephalic, atraumatic, face symmetric, no Cushingoid features. EYES: Black rimmed glasses. Hazel/green eyes. Pupils equal round and reactive to light and accomodation. No conjunctivitis or scleral icterus. ENT: Oropharynx clear without lesion. Partial. Tongue normal. Mucous membranes moist.  RESPIRATORY:  Clear to auscultation without rales, wheezes or rhonchi. CARDIOVASCULAR: Regular rate and rhythm without murmur, rub or gallop. ABDOMEN: Soft, non-tender with active bowel sounds. No guarding or rebound tenderness. No hepatosplenomegaly. SKIN: No rashes, ulcer or skin lesions.  EXTREMITIES: No edema, no skin discoloration or tenderness. No palpable cords. LYMPH NODES: Right low anterior cervical 2-3 cm soft mass (thyroid).  No palpable supraclavicular, axillary or inguinal adenopathy  NEUROLOGICAL: Appropriate. PSYCH:  Appropriate.  Affect normal.   Appointment on 10/08/2015  Component Date Value Ref Range Status  . Sodium 10/08/2015 140  135 - 145 mmol/L Final  . Potassium 10/08/2015 4.3  3.5 - 5.1 mmol/L Final  . Chloride 10/08/2015 108  101 - 111 mmol/L Final  . CO2 10/08/2015 23  22 - 32 mmol/L Final  . Glucose, Bld 10/08/2015 130* 65 - 99 mg/dL Final  . BUN 10/08/2015 12  6 - 20 mg/dL Final  . Creatinine, Ser 10/08/2015 0.75  0.44 - 1.00 mg/dL Final  . Calcium 10/08/2015 9.5   8.9 - 10.3 mg/dL Final  . Total Protein 10/08/2015 6.6  6.5 - 8.1 g/dL Final  . Albumin 10/08/2015 3.4* 3.5 - 5.0 g/dL Final  . AST 10/08/2015 22  15 - 41 U/L Final  . ALT 10/08/2015 11* 14 - 54 U/L Final  . Alkaline Phosphatase 10/08/2015 112  38 - 126 U/L Final  . Total Bilirubin 10/08/2015 0.6  0.3 - 1.2 mg/dL Final  . GFR calc non Af Amer 10/08/2015 >60  >60 mL/min Final  . GFR calc Af Amer 10/08/2015 >60  >60 mL/min Final   Comment: (NOTE) The eGFR has been calculated using the CKD EPI equation. This calculation has not been validated in all clinical situations. eGFR's persistently <60 mL/min signify possible Chronic Kidney Disease.   . Anion gap 10/08/2015 9  5 - 15 Final  . WBC 10/08/2015 8.0  3.6 - 11.0 K/uL Final  . RBC 10/08/2015 3.72* 3.80 - 5.20 MIL/uL Final  . Hemoglobin 10/08/2015 11.0* 12.0 - 16.0 g/dL Final  . HCT 10/08/2015 32.8* 35.0 - 47.0 % Final  . MCV 10/08/2015 88.3  80.0 - 100.0 fL Final  . MCH 10/08/2015 29.7  26.0 - 34.0 pg Final  . MCHC 10/08/2015 33.7  32.0 - 36.0 g/dL Final  . RDW 10/08/2015 16.8* 11.5 - 14.5 % Final  . Platelets 10/08/2015 205  150 - 440 K/uL Final  . Neutrophils Relative % 10/08/2015 73   Final  . Neutro Abs 10/08/2015 5.9  1.4 - 6.5 K/uL Final  . Lymphocytes Relative 10/08/2015 17   Final  . Lymphs Abs 10/08/2015 1.3  1.0 - 3.6 K/uL Final  . Monocytes Relative 10/08/2015 7   Final  . Monocytes Absolute 10/08/2015 0.5  0.2 - 0.9 K/uL Final  . Eosinophils Relative 10/08/2015 3   Final  . Eosinophils Absolute 10/08/2015 0.2  0 - 0.7 K/uL Final  . Basophils Relative 10/08/2015 0   Final  . Basophils Absolute 10/08/2015 0.0  0 - 0.1 K/uL Final  . Magnesium 10/08/2015 2.1  1.7 - 2.4 mg/dL Final  Admission on 10/07/2015, Discharged on 10/07/2015  Component Date Value Ref Range Status  . WBC 10/07/2015 8.1  3.6 - 11.0 K/uL Final  . RBC 10/07/2015 3.67* 3.80 - 5.20 MIL/uL Final  . Hemoglobin 10/07/2015 11.1* 12.0 - 16.0 g/dL Final  .  HCT 10/07/2015 32.5* 35.0 - 47.0 % Final  . MCV 10/07/2015 88.5  80.0 - 100.0 fL Final  . MCH  10/07/2015 30.1  26.0 - 34.0 pg Final  . MCHC 10/07/2015 34.0  32.0 - 36.0 g/dL Final  . RDW 10/07/2015 16.4* 11.5 - 14.5 % Final  . Platelets 10/07/2015 204  150 - 440 K/uL Final  . Sodium 10/07/2015 141  135 - 145 mmol/L Final  . Potassium 10/07/2015 3.9  3.5 - 5.1 mmol/L Final  . Chloride 10/07/2015 108  101 - 111 mmol/L Final  . CO2 10/07/2015 25  22 - 32 mmol/L Final  . Glucose, Bld 10/07/2015 128* 65 - 99 mg/dL Final  . BUN 10/07/2015 8  6 - 20 mg/dL Final  . Creatinine, Ser 10/07/2015 0.78  0.44 - 1.00 mg/dL Final  . Calcium 10/07/2015 9.7  8.9 - 10.3 mg/dL Final  . Total Protein 10/07/2015 6.4* 6.5 - 8.1 g/dL Final  . Albumin 10/07/2015 3.4* 3.5 - 5.0 g/dL Final  . AST 10/07/2015 21  15 - 41 U/L Final  . ALT 10/07/2015 11* 14 - 54 U/L Final  . Alkaline Phosphatase 10/07/2015 108  38 - 126 U/L Final  . Total Bilirubin 10/07/2015 0.9  0.3 - 1.2 mg/dL Final  . GFR calc non Af Amer 10/07/2015 >60  >60 mL/min Final  . GFR calc Af Amer 10/07/2015 >60  >60 mL/min Final   Comment: (NOTE) The eGFR has been calculated using the CKD EPI equation. This calculation has not been validated in all clinical situations. eGFR's persistently <60 mL/min signify possible Chronic Kidney Disease.   . Anion gap 10/07/2015 8  5 - 15 Final  . Troponin I 10/07/2015 <0.03  <0.031 ng/mL Final   Comment:        NO INDICATION OF MYOCARDIAL INJURY.   . Troponin I 10/07/2015 <0.03  <0.031 ng/mL Final   Comment:        NO INDICATION OF MYOCARDIAL INJURY.     Assessment:  Sandy [name] is a 70 y.o. female with metastatic colon cancer. She presented with a 53 pound weight loss in 6 months. Colonoscopy on 09/07/2014 revealed a circumferential partially obstructive fungating mass in the descending colon. There was no active bleeding. There was a 1.5 cm pedunculated polyp in the sigmoid colon  (tubulovillous adenoma). Pathology from the descending colon mass revealed fragments of adenocarcinoma. There was not enough tissue for KRAS testing.  CEA was 11,793 on 09/05/2014.  KRAS and NRAS mutational analysis on 09/02/2015 was incomplete.  KRAS results from analysis of codons 59, 61, and 117 were negative.  Results were not obtained for codons 12, 13, and 146.  NRAS gene results from analysis of codons 12 and 13 were negative.  Results were not obtained for codons 59, 69, 61, 117, and 146.  Chest CT angiogram on 08/28/2014 revealed no evidence of pulmonary embolism, but multiple large enhancing liver masses (largest 6.4 cm).  PET scan on 10/04/2014 revealed 6.5 x 6.1 cm hypermetabolic mass in the proximal sigmoid colon,  There were  numerous large hypermetabolic hepatic metastases. There were no additional sites of metastatic disease in the neck, chest or skeleton.  In addition, there was a 3.2 x 2.8 cm low-attenuation right thyroid nodule.   She is status post 14 cycles of FOLFOX chemotherapy (10/09/2014 - 04/11/2015).  She declined Avastin.  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    CEA was 10,169 on 10/09/2014 , 12,034 on 0712/2016, 7765 on 11/20/2014, 5154 on 12/04/2014, 1921 on 01/01/2015, 1266 on 01/15/2015, 770.6 on 01/29/2015, 502.7 on 02/12/2015, 368.7 on 02/26/2015, 269  on 03/14/2015, 178.3 on 03/28/2015, 135.9 on 04/11/2015, 116.1 on 05/13/2015, 90.8 on 06/10/2015, 145.9 on 07/08/2015, 300.7 on 08/05/2015, 296.3 on 08/19/2015, and 443.0 on 09/24/2015, and 1341 on 10/08/2015.  Abdominal and pelvic CT scan on 12/28/2014 revealed response to therapy.  Liver metastases and the descending/sigmoid mass were smaller.  There was no new disease.  Abdomen and pelvic CT scan on 03/25/2015 revealed interval decrease in size of hepatic metastasis and distal descending colon mass.   Abdominal and pelvic CT scan on 03/25/2015 revealed decreasing hepatic metastasis and distal  descending colon mass.  Right hepatic lobe lesion was 2.2 x 2.1 cm.  Left lateral hepatic lobe lesion was 1.9 x 1.5 cm.  There was stable thickening of the adrenal glands.  She is status post 8 cycles of 5-fluorouracil and leucovorin (04/28/2014 - 09/24/2015).  Cycle #4 was held on 06/10/2015 secondary to rectal bleeding.  Abdominal and pelvic MRI on 08/01/2015 revealed multiple hypoenhancing hepatic metastasis in both lobes of the liver (3.3 x 2.2 cm in segment VII, 5.9 x 4.1 cm in segment VIII, and a 2.4 x 1.7 cm caudate lesion).  There was a 4.3 cm length of focally narrowed sigmoid colon.  There was nodular thickening of the right adrenal gland.  PET scan on 08/21/2015 revealed interval improvement in the multifocal hepatic metastatic disease. There was residual hypermetabolic tumor.  The primary colonic lesion at the junction of the descending and sigmoid colon had decreased in size and metabolic activity.  There was persistent hypermetabolic activity in the lower right neck, possibly associated with the thyroid gland.   Abdominal and pelvic CT scan on 10/04/2015 revealed increased bulkiness of soft tissue mass in distal descending colon compared with previous studies, consistent with primary colon carcinoma.  The mass measured 4.7 cm compared to 3.3 cm.  The liver metastasis were felt larger by second radiology review.  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  Afirma gene expression classifier GEC) was suspicious.  Gene expression signature for medullary thyroid cancer (MTC) and BRAF [id number] were negative.  Symptomatically, she denies any abdominal pain.  Exam is stable.  Plan: 1.  Labs today:  CBC with diff, CMP, Mg, CEA. 2.  Discuss abdominal and pelvic CT scan demonstrating progression in liver and likely primary site.  Discuss treatment options.  Discuss FOLFIRI +/- Cetuximab.  Discuss limited KRAS  testing.  Discuss Y-90 liver directed therapy (does not address primary site).  Side effects of chemotherapy reviewed.  Discuss management of diarrhea (Imodium +/- Lomotil).  Discuss potential use of GCSF if counts low.  Discuss full dose or starting low and advancing as tolerated.  Patient wishes low dose and advancement.  Patient consented to treatment. 3.  Discuss results of thyroid biopsy- suspicious.  No intervention at this time. 4.  Cycle #1 FOLFIRI. 5.  RTC in 2 days for pump disconnect. 6.  RTC on 10/14/2015 for MD assess and labs (CBC with diff, BMP). 7.  RTC in 2 weeks for MD assessment, labs (CBC with diff, CMP, Mg), and cycle #2 FOLFIRI (advance dose as tolerated).   Lequita Asal, MD  10/08/2015, 9:45 AM

## 2015-10-09 LAB — CEA: CEA: 1341 ng/mL — ABNORMAL HIGH (ref 0.0–4.7)

## 2015-10-10 ENCOUNTER — Inpatient Hospital Stay: Payer: Medicare Other

## 2015-10-10 DIAGNOSIS — C787 Secondary malignant neoplasm of liver and intrahepatic bile duct: Principal | ICD-10-CM

## 2015-10-10 DIAGNOSIS — Z5111 Encounter for antineoplastic chemotherapy: Secondary | ICD-10-CM | POA: Diagnosis not present

## 2015-10-10 DIAGNOSIS — C189 Malignant neoplasm of colon, unspecified: Secondary | ICD-10-CM

## 2015-10-10 MED ORDER — SODIUM CHLORIDE 0.9% FLUSH
10.0000 mL | INTRAVENOUS | Status: DC | PRN
  Administered 2015-10-10: 10 mL
  Filled 2015-10-10: qty 10

## 2015-10-10 MED ORDER — HEPARIN SOD (PORK) LOCK FLUSH 100 UNIT/ML IV SOLN
500.0000 [IU] | Freq: Once | INTRAVENOUS | Status: AC | PRN
  Administered 2015-10-10: 500 [IU]
  Filled 2015-10-10: qty 5

## 2015-10-14 ENCOUNTER — Inpatient Hospital Stay: Payer: No Typology Code available for payment source

## 2015-10-14 ENCOUNTER — Inpatient Hospital Stay: Payer: No Typology Code available for payment source | Attending: Hematology and Oncology

## 2015-10-14 ENCOUNTER — Encounter: Payer: Self-pay | Admitting: Hematology and Oncology

## 2015-10-14 ENCOUNTER — Inpatient Hospital Stay (HOSPITAL_BASED_OUTPATIENT_CLINIC_OR_DEPARTMENT_OTHER): Payer: No Typology Code available for payment source | Admitting: Hematology and Oncology

## 2015-10-14 ENCOUNTER — Telehealth: Payer: Self-pay | Admitting: *Deleted

## 2015-10-14 VITALS — BP 109/70 | HR 59 | Temp 97.0°F | Resp 18 | Wt 144.7 lb

## 2015-10-14 DIAGNOSIS — Z9221 Personal history of antineoplastic chemotherapy: Secondary | ICD-10-CM | POA: Diagnosis not present

## 2015-10-14 DIAGNOSIS — C187 Malignant neoplasm of sigmoid colon: Secondary | ICD-10-CM | POA: Diagnosis not present

## 2015-10-14 DIAGNOSIS — R400 Somnolence: Secondary | ICD-10-CM | POA: Insufficient documentation

## 2015-10-14 DIAGNOSIS — G629 Polyneuropathy, unspecified: Secondary | ICD-10-CM | POA: Insufficient documentation

## 2015-10-14 DIAGNOSIS — Z79899 Other long term (current) drug therapy: Secondary | ICD-10-CM

## 2015-10-14 DIAGNOSIS — R634 Abnormal weight loss: Secondary | ICD-10-CM | POA: Diagnosis not present

## 2015-10-14 DIAGNOSIS — Z7984 Long term (current) use of oral hypoglycemic drugs: Secondary | ICD-10-CM | POA: Diagnosis not present

## 2015-10-14 DIAGNOSIS — R109 Unspecified abdominal pain: Secondary | ICD-10-CM | POA: Insufficient documentation

## 2015-10-14 DIAGNOSIS — T451X5A Adverse effect of antineoplastic and immunosuppressive drugs, initial encounter: Secondary | ICD-10-CM

## 2015-10-14 DIAGNOSIS — Z87891 Personal history of nicotine dependence: Secondary | ICD-10-CM | POA: Insufficient documentation

## 2015-10-14 DIAGNOSIS — I10 Essential (primary) hypertension: Secondary | ICD-10-CM | POA: Diagnosis not present

## 2015-10-14 DIAGNOSIS — E041 Nontoxic single thyroid nodule: Secondary | ICD-10-CM

## 2015-10-14 DIAGNOSIS — Z7982 Long term (current) use of aspirin: Secondary | ICD-10-CM

## 2015-10-14 DIAGNOSIS — R197 Diarrhea, unspecified: Secondary | ICD-10-CM

## 2015-10-14 DIAGNOSIS — G4730 Sleep apnea, unspecified: Secondary | ICD-10-CM

## 2015-10-14 DIAGNOSIS — D709 Neutropenia, unspecified: Secondary | ICD-10-CM | POA: Diagnosis not present

## 2015-10-14 DIAGNOSIS — Z809 Family history of malignant neoplasm, unspecified: Secondary | ICD-10-CM

## 2015-10-14 DIAGNOSIS — K5900 Constipation, unspecified: Secondary | ICD-10-CM

## 2015-10-14 DIAGNOSIS — C189 Malignant neoplasm of colon, unspecified: Secondary | ICD-10-CM | POA: Insufficient documentation

## 2015-10-14 DIAGNOSIS — E119 Type 2 diabetes mellitus without complications: Secondary | ICD-10-CM

## 2015-10-14 DIAGNOSIS — E860 Dehydration: Secondary | ICD-10-CM | POA: Diagnosis not present

## 2015-10-14 DIAGNOSIS — K921 Melena: Secondary | ICD-10-CM | POA: Insufficient documentation

## 2015-10-14 DIAGNOSIS — Z7689 Persons encountering health services in other specified circumstances: Secondary | ICD-10-CM | POA: Insufficient documentation

## 2015-10-14 DIAGNOSIS — G4700 Insomnia, unspecified: Secondary | ICD-10-CM | POA: Diagnosis not present

## 2015-10-14 DIAGNOSIS — F17210 Nicotine dependence, cigarettes, uncomplicated: Secondary | ICD-10-CM

## 2015-10-14 DIAGNOSIS — C787 Secondary malignant neoplasm of liver and intrahepatic bile duct: Secondary | ICD-10-CM | POA: Diagnosis not present

## 2015-10-14 DIAGNOSIS — R5383 Other fatigue: Secondary | ICD-10-CM | POA: Insufficient documentation

## 2015-10-14 DIAGNOSIS — D701 Agranulocytosis secondary to cancer chemotherapy: Secondary | ICD-10-CM

## 2015-10-14 LAB — BASIC METABOLIC PANEL
Anion gap: 10 (ref 5–15)
BUN: 18 mg/dL (ref 6–20)
CO2: 19 mmol/L — ABNORMAL LOW (ref 22–32)
Calcium: 10 mg/dL (ref 8.9–10.3)
Chloride: 110 mmol/L (ref 101–111)
Creatinine, Ser: 1.03 mg/dL — ABNORMAL HIGH (ref 0.44–1.00)
GFR calc Af Amer: >60 mL/min (ref >60)
GFR calc non Af Amer: 53 mL/min — ABNORMAL LOW (ref >60)
Glucose, Bld: 167 mg/dL — ABNORMAL HIGH (ref 65–99)
Potassium: 4.2 mmol/L (ref 3.5–5.1)
Sodium: 139 mmol/L (ref 135–145)

## 2015-10-14 LAB — CBC WITH DIFFERENTIAL/PLATELET
Basophils Absolute: 0 10*3/uL (ref 0–0.1)
Basophils Relative: 1 %
Eosinophils Absolute: 0 10*3/uL (ref 0–0.7)
Eosinophils Relative: 3 %
HCT: 30.5 % — ABNORMAL LOW (ref 35.0–47.0)
Hemoglobin: 10.4 g/dL — ABNORMAL LOW (ref 12.0–16.0)
Lymphocytes Relative: 59 %
Lymphs Abs: 0.6 10*3/uL — ABNORMAL LOW (ref 1.0–3.6)
MCH: 29.8 pg (ref 26.0–34.0)
MCHC: 34 g/dL (ref 32.0–36.0)
MCV: 87.7 fL (ref 80.0–100.0)
Monocytes Absolute: 0 10*3/uL — ABNORMAL LOW (ref 0.2–0.9)
Monocytes Relative: 2 %
Neutro Abs: 0.4 10*3/uL — ABNORMAL LOW (ref 1.4–6.5)
Neutrophils Relative %: 35 %
Platelets: 228 10*3/uL (ref 150–440)
RBC: 3.47 MIL/uL — ABNORMAL LOW (ref 3.80–5.20)
RDW: 16.2 % — ABNORMAL HIGH (ref 11.5–14.5)
WBC: 1.1 10*3/uL — CL (ref 3.6–11.0)

## 2015-10-14 MED ORDER — OXYCODONE HCL 5 MG PO TABS: 5.0000 mg | ORAL_TABLET | Freq: Four times a day (QID) | ORAL | Status: DC | PRN

## 2015-10-14 MED ORDER — TBO-FILGRASTIM 300 MCG/0.5ML ~~LOC~~ SOSY
300.0000 ug | PREFILLED_SYRINGE | Freq: Once | SUBCUTANEOUS | Status: AC
  Administered 2015-10-14: 300 ug via SUBCUTANEOUS
  Filled 2015-10-14: qty 0.5

## 2015-10-14 NOTE — Telephone Encounter (Signed)
Called to report that she is taking oxycodone with acetaminophen and as discussed at appt today, will need plain oxycodone. States unsure if you were going to go up on the dose to 10 mg or stay at 5 mg. Please advise She plans on picking up Rx when she returns on Thursday

## 2015-10-14 NOTE — Progress Notes (Signed)
Critical ANC called of 0.4 at 1030 and a WBC of 1.1.  MD made aware.

## 2015-10-14 NOTE — Progress Notes (Signed)
Patient is here for follow up, new medication was causing diarrhea but patient is using Imodium as needed to help with loose stools.

## 2015-10-14 NOTE — Progress Notes (Signed)
Sandy [name] is a 70 y.o. female with stage IV colon cancer who is seen for assessment on day 7 of cycle #1 FOLFIRI chemotherapy.  HPI: The patient was last seen in the medical oncology clinic on 10/08/2015.  At that time, she received received cycle #1 FOLFIRI.    She notes diarrhea beginning the same day as chemotherapy.  She has used Imodium.  She has had alternating diarrhea and constipation.  She typically uses 2 imodium then takes sorbitol.  She has eaten well.  She has had little nausea and no vomiting.  She has slept a lot.  She notes some pain on her right side since 10/09/2015.  She has had some cramps.  She denies any melena or hematochezia.  She denies any flank pain  Or urinary symptoms.  She has taken up to 2 1/2 pain pills at a time.  She denies any fever.   Past Medical History  Diagnosis Date  . Hypertension   . Constipation   . Insomnia   . Diabetes mellitus without complication ([facility])   . Cancer ([place])     liver mets; colon cancer  . 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 HOH (hard of hearing)   . Wears dentures     partial upper and lower    Past Surgical History  Procedure Laterality Date  . Colonoscopy with propofol N/A 09/07/2014    Procedure: COLONOSCOPY WITH PROPOFOL;  Surgeon: Lucilla Lame, MD;  Location: [facility] ENDOSCOPY;  Service: Endoscopy;  Laterality: N/A;  . Esophagogastroduodenoscopy N/A 09/07/2014    Procedure: ESOPHAGOGASTRODUODENOSCOPY (EGD);  Surgeon: Lucilla Lame, MD;  Location: [facility] ENDOSCOPY;  Service: Endoscopy;  Laterality: N/A;  . Portacath placement Left 09/24/2014    Procedure: INSERTION PORT-A-CATH;  Surgeon: Robert Bellow, MD;  Location: [facility] ORS;  Service: General;  Laterality: Left;  . Liver biopsy    . Cardiac catheterization    . Flexible sigmoidoscopy N/A 09/02/2015    Procedure: FLEXIBLE SIGMOIDOSCOPY;  Surgeon: Lucilla Lame, MD;  Location: [facility];  Service: Endoscopy;  Laterality: N/A;  Diabetic - oral meds Pt has port-a-cath    Family History  Problem Relation Age of Onset  . Cancer Sister     Social History:  reports that she quit smoking about 2 years ago. Her smoking use included Cigarettes. She has a 5 pack-year smoking history. She has never used smokeless tobacco. She reports that she does not drink alcohol or use illicit drugs.  The patient is accompanied by her daughter.   Allergies: No Known Allergies  Current Medications: Current Outpatient Prescriptions  Medication Sig Dispense Refill  . amLODipine (NORVASC) 5 MG tablet Take 5 mg by mouth daily. Reported on 09/24/2015    . aspirin EC 81 MG tablet Take 81 mg by mouth daily. Reported on 08/26/2015    . atorvastatin (LIPITOR) 40 MG tablet Take 40 mg by mouth daily.    Marland Kitchen gabapentin (NEURONTIN) 100 MG capsule Take 2 capsules (200 mg total) by mouth at bedtime. 1 tab daily and 2 tabs at bedtime. 60 capsule 0  . lidocaine-prilocaine (EMLA) cream Apply 1 application topically as needed. Apply to port 1 hour prior to chemotherapy appointment. Cover with plastic wrap. 30 g 1  . lisinopril (PRINIVIL,ZESTRIL) 10 MG tablet Take 10 mg by mouth daily.    . magnesium 30 MG  tablet Take 30 mg by mouth daily.     . megestrol (MEGACE) 400 MG/10ML suspension Take 5 mLs (200 mg total) by mouth daily. to stimulate appetite 240 mL 0  . metFORMIN (GLUCOPHAGE) 1000 MG tablet Take 1,000 mg by mouth 2 (two) times daily.     . metoprolol (LOPRESSOR) 100 MG tablet Take 100 mg by mouth.    . Multiple Vitamin (MULTIVITAMIN WITH MINERALS) TABS tablet Take 1 tablet by mouth daily.    . ondansetron (ZOFRAN) 4 MG tablet Take 1 tablet (4 mg total) by mouth every 8 (eight) hours as needed for nausea or vomiting. 30 tablet 2  . oxyCODONE-acetaminophen (PERCOCET/ROXICET) 5-325 MG tablet Take 1 tablet by mouth every 6 (six) hours as needed for severe pain. 40 tablet 0   . potassium chloride (K-DUR,KLOR-CON) 10 MEQ tablet Take 1 tablet (10 mEq total) by mouth once. Take 1 pill twice a day for 3 days then 1 pill a day. 30 tablet 0  . sorbitol 70 % solution Take 30 mLs by mouth daily as needed.     No current facility-administered medications for this visit.   Facility-Administered Medications Ordered in Other Visits  Medication Dose Route Frequency Provider Last Rate Last Dose  . acetaminophen (TYLENOL) tablet 650 mg  650 mg Oral Once Lequita Asal, MD   650 mg at 10/19/14 9242  . sodium chloride 0.9 % injection 10 mL  10 mL Intracatheter PRN Lequita Asal, MD   10 mL at 10/11/14 1431  . sodium chloride 0.9 % injection 10 mL  10 mL Intracatheter PRN Lequita Asal, MD   10 mL at 02/28/15 1529    Review of Systems:  GENERAL: Fatigue.  No fevers.  Little sweaty (normal).  Weight down 5 pounds. PERFORMANCE STATUS (ECOG): 2. HEENT: Wears a hearing aide. No visual changes, runny nose, sore throat, mouth sores or tenderness. Lungs: No shortness of breath or cough. No hemoptysis. Cardiac: No chest pain, palpitations, orthopnea, or PND. GI:  Eating "ok".  Constipation alternating with diarhea.  No nausea, vomiting, diarrhea, melena or hematochezia. GU: Wears a Depends pad.  No dysuria or hematuria. Musculoskeletal: Denies back pain. No joint pain. No muscle tenderness. Extremities: No pain or swelling. Skin: No rashes, skin lesions, or ulcers Neuro:  Neuropathy in fingers and bottom of feet (stable).  No headache, numbness or weakness, or balance issues. Endocrine: Diabetes.  No thyroid issues, hot flashes or night sweats. Psych: Patient states that she is seeing Dr. Quay Burow about depression tomorrow.  No mood changes or anxiety. Pain: Takes oxycodone prn. Review of systems: All other systems reviewed and found to be negative.  Physical Exam: Blood pressure 109/70, pulse 59, temperature 97 F (36.1 C), temperature source Tympanic,  resp. rate 18, weight 144 lb 11.7 oz (65.65 kg). GENERAL: Elderly woman sitting comfortably in a wheelchair in the exam room in no acute distress. MENTAL STATUS: Alert and oriented to person, place and time. HEAD: Styled curly brown hair. Normocephalic, atraumatic, face symmetric, no Cushingoid features. EYES: Black rimmed glasses. Hazel/green eyes. Pupils equal round and reactive to light and accomodation. No conjunctivitis or scleral icterus. ENT: Oropharynx clear without lesion. Partial. Tongue normal. Mucous membranes moist.  RESPIRATORY: Clear to auscultation without rales, wheezes or rhonchi. CARDIOVASCULAR: Regular rate and rhythm without murmur, rub or gallop. BACK:  No CVA tenderness. ABDOMEN: Soft, non-tender with active bowel sounds. No guarding or rebound tenderness. No hepatosplenomegaly. SKIN: No rashes, ulcer or skin lesions.  EXTREMITIES:  No edema, no skin discoloration or tenderness. No palpable cords. LYMPH NODES: Right low anterior cervical 2-3 cm soft mass (thyroid).  No palpable supraclavicular, axillary or inguinal adenopathy  NEUROLOGICAL: Appropriate. PSYCH:  Appropriate.  Affect normal.   Appointment on 10/14/2015  Component Date Value Ref Range Status  . WBC 10/14/2015 1.1* 3.6 - 11.0 K/uL Final   [facility] CRITICAL VALUE PROTOCOL  . RBC 10/14/2015 3.47* 3.80 - 5.20 MIL/uL Final  . Hemoglobin 10/14/2015 10.4* 12.0 - 16.0 g/dL Final  . HCT 10/14/2015 30.5* 35.0 - 47.0 % Final  . MCV 10/14/2015 87.7  80.0 - 100.0 fL Final  . MCH 10/14/2015 29.8  26.0 - 34.0 pg Final  . MCHC 10/14/2015 34.0  32.0 - 36.0 g/dL Final  . RDW 10/14/2015 16.2* 11.5 - 14.5 % Final  . Platelets 10/14/2015 228  150 - 440 K/uL Final  . Neutrophils Relative % 10/14/2015 35%   Final  . Neutro Abs 10/14/2015 0.4* 1.4 - 6.5 K/uL Final   Comment: CRITICAL RESULT CALLED TO, READ BACK BY AND VERIFIED WITH: BRANDY MOYA ON 10/14/15 AT 1030 QSD   . Lymphocytes Relative  10/14/2015 59%   Final  . Lymphs Abs 10/14/2015 0.6* 1.0 - 3.6 K/uL Final  . Monocytes Relative 10/14/2015 2%   Final  . Monocytes Absolute 10/14/2015 0.0* 0.2 - 0.9 K/uL Final  . Eosinophils Relative 10/14/2015 3%   Final  . Eosinophils Absolute 10/14/2015 0.0  0 - 0.7 K/uL Final  . Basophils Relative 10/14/2015 1%   Final  . Basophils Absolute 10/14/2015 0.0  0 - 0.1 K/uL Final  . Sodium 10/14/2015 139  135 - 145 mmol/L Final  . Potassium 10/14/2015 4.2  3.5 - 5.1 mmol/L Final  . Chloride 10/14/2015 110  101 - 111 mmol/L Final  . CO2 10/14/2015 19* 22 - 32 mmol/L Final  . Glucose, Bld 10/14/2015 167* 65 - 99 mg/dL Final  . BUN 10/14/2015 18  6 - 20 mg/dL Final  . Creatinine, Ser 10/14/2015 1.03* 0.44 - 1.00 mg/dL Final  . Calcium 10/14/2015 10.0  8.9 - 10.3 mg/dL Final  . GFR calc non Af Amer 10/14/2015 53* >60 mL/min Final  . GFR calc Af Amer 10/14/2015 >60  >60 mL/min Final   Comment: (NOTE) The eGFR has been calculated using the CKD EPI equation. This calculation has not been validated in all clinical situations. eGFR's persistently <60 mL/min signify possible Chronic Kidney Disease.   . Anion gap 10/14/2015 10  5 - 15 Final    Assessment:  SHELITHA MAGLEY is a 70 y.o. female with metastatic colon cancer. She presented with a 53 pound weight loss in 6 months. Colonoscopy on 09/07/2014 revealed a circumferential partially obstructive fungating mass in the descending colon. There was no active bleeding. There was a 1.5 cm pedunculated polyp in the sigmoid colon (tubulovillous adenoma). Pathology from the descending colon mass revealed fragments of adenocarcinoma. There was not enough tissue for KRAS testing.  CEA was 11,793 on 09/05/2014.  KRAS and NRAS mutational analysis on 09/02/2015 was incomplete.  KRAS results from analysis of codons 59, 61, and 117 were negative.  Results were not obtained for codons 12, 13, and 146.  NRAS gene results from analysis of codons 12 and 13  were negative.  Results were not obtained for codons 59, 69, 61, 117, and 146.  Chest CT angiogram on 08/28/2014 revealed no evidence of pulmonary embolism, but multiple large enhancing liver masses (largest 6.4 cm).  PET  scan on 10/04/2014 revealed 6.5 x 6.1 cm hypermetabolic mass in the proximal sigmoid colon,  There were  numerous large hypermetabolic hepatic metastases. There were no additional sites of metastatic disease in the neck, chest or skeleton.  In addition, there was a 3.2 x 2.8 cm low-attenuation right thyroid nodule.   She is status post 14 cycles of FOLFOX chemotherapy (10/09/2014 - 04/11/2015).  She declined Avastin.  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    CEA was 10,169 on 10/09/2014 , 12,034 on 0712/2016, 7765 on 11/20/2014, 5154 on 12/04/2014, 1921 on 01/01/2015, 1266 on 01/15/2015, 770.6 on 01/29/2015, 502.7 on 02/12/2015, 368.7 on 02/26/2015, 269 on 03/14/2015, 178.3 on 03/28/2015, 135.9 on 04/11/2015, 116.1 on 05/13/2015, 90.8 on 06/10/2015, 145.9 on 07/08/2015, 300.7 on 08/05/2015, 296.3 on 08/19/2015, and 443.0 on 09/24/2015, and 1341 on 10/08/2015.  Abdominal and pelvic CT scan on 12/28/2014 revealed response to therapy.  Liver metastases and the descending/sigmoid mass were smaller.  There was no new disease.  Abdomen and pelvic CT scan on 03/25/2015 revealed interval decrease in size of hepatic metastasis and distal descending colon mass.   Abdominal and pelvic CT scan on 03/25/2015 revealed decreasing hepatic metastasis and distal descending colon mass.  Right hepatic lobe lesion was 2.2 x 2.1 cm.  Left lateral hepatic lobe lesion was 1.9 x 1.5 cm.  There was stable thickening of the adrenal glands.  She is status post 8 cycles of 5-fluorouracil and leucovorin (04/28/2014 - 09/24/2015).  Cycle #4 was held on 06/10/2015 secondary to rectal bleeding.  Abdominal and pelvic MRI on 08/01/2015 revealed multiple hypoenhancing hepatic metastasis in both  lobes of the liver (3.3 x 2.2 cm in segment VII, 5.9 x 4.1 cm in segment VIII, and a 2.4 x 1.7 cm caudate lesion).  There was a 4.3 cm length of focally narrowed sigmoid colon.  There was nodular thickening of the right adrenal gland.  PET scan on 08/21/2015 revealed interval improvement in the multifocal hepatic metastatic disease. There was residual hypermetabolic tumor.  The primary colonic lesion at the junction of the descending and sigmoid colon had decreased in size and metabolic activity.  There was persistent hypermetabolic activity in the lower right neck, possibly associated with the thyroid gland.   Abdominal and pelvic CT scan on 10/04/2015 revealed increased bulkiness of soft tissue mass in distal descending colon compared with previous studies, consistent with primary colon carcinoma.  The mass measured 4.7 cm compared to 3.3 cm.  The liver metastasis were felt larger by second radiology review.  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  Afirma gene expression classifier GEC) was suspicious.  Gene expression signature for medullary thyroid cancer (MTC) and BRAF V600E were negative.  SHe is s/p 1 cycle of FOLFIRI chemotherapy (10/08/2015).  She has had diarrhea alternating with constipation as she has uses Imodium and Sorbitol.  Symptomatically, she is fatigued.  She has abdominal discomfort/cramping.  Exam is stable.  Plan: 1.  Labs today:  CBC with diff, BMP. 2.  Discuss management of diarrhea.  Discuss taking 1 Imodium at a time secondary to constipation issues.  Discuss importance of fluids and nutrition. 3.  Discuss neutropenic precautions.  Discuss initiation of Granix.  Discuss Neulasta with next cycle.  Discuss advancing dose as tolerated. 4.  Preauth Granix (this cycle) and Neulasta (next cycle). 5.  Granix 300 mcg SQ today. 6.  RTC on 07/05 for CBC  with diff +/- Granix 7.  RTC on 07/06  and 07/07 for +/- Granix 8.  RTC on 10/22/2015 for MD assessment, labs (CBC with diff, CMP, Mg), and cycle #2 FOLFIRI (advance dose as tolerated).   Lequita Asal, MD  10/14/2015, 10:50 AM

## 2015-10-16 ENCOUNTER — Telehealth: Payer: Self-pay

## 2015-10-16 ENCOUNTER — Inpatient Hospital Stay: Payer: No Typology Code available for payment source

## 2015-10-16 ENCOUNTER — Ambulatory Visit: Payer: Medicare Other

## 2015-10-16 ENCOUNTER — Other Ambulatory Visit: Payer: Medicare Other

## 2015-10-16 NOTE — Telephone Encounter (Signed)
Called and spoke with [name] (daughter) about pt not coming to appt. Daughter stated she got up this morning she got a shower and then Pt didn't want to move.  Pt verbalized she didn't want to come in for injection.  Daughter verbalizes she ate good this morning.  She really thinks it is related to the recent loss of pt's brother.  Pt's daughter stated she put her dentures in wrong before appt.  I asked if pt should go to ER and daughter verbalizes "no, I have a close eye on momma and don't think she needs to go".  [name] verbalizes she ill keep an eye on her and will go to the ER or call us with any concerns.

## 2015-10-17 ENCOUNTER — Inpatient Hospital Stay: Payer: No Typology Code available for payment source

## 2015-10-17 MED ORDER — TBO-FILGRASTIM 300 MCG/0.5ML ~~LOC~~ SOSY
300.0000 ug | PREFILLED_SYRINGE | Freq: Once | SUBCUTANEOUS | Status: AC
  Administered 2015-10-17: 300 ug via SUBCUTANEOUS
  Filled 2015-10-17: qty 0.5

## 2015-10-18 ENCOUNTER — Inpatient Hospital Stay: Payer: No Typology Code available for payment source

## 2015-10-18 ENCOUNTER — Other Ambulatory Visit: Payer: Self-pay

## 2015-10-18 ENCOUNTER — Inpatient Hospital Stay
Admission: EM | Admit: 2015-10-18 | Discharge: 2015-10-22 | DRG: 809 | Disposition: A | Discharge status: Skilled Nursing Facility | Payer: Medicare Other | Attending: Internal Medicine | Admitting: Internal Medicine

## 2015-10-18 ENCOUNTER — Encounter: Payer: Self-pay | Admitting: Emergency Medicine

## 2015-10-18 ENCOUNTER — Emergency Department: Payer: Medicare Other

## 2015-10-18 DIAGNOSIS — I10 Essential (primary) hypertension: Secondary | ICD-10-CM | POA: Diagnosis present

## 2015-10-18 DIAGNOSIS — I4891 Unspecified atrial fibrillation: Secondary | ICD-10-CM | POA: Diagnosis present

## 2015-10-18 DIAGNOSIS — Z809 Family history of malignant neoplasm, unspecified: Secondary | ICD-10-CM | POA: Diagnosis not present

## 2015-10-18 DIAGNOSIS — Z9889 Other specified postprocedural states: Secondary | ICD-10-CM | POA: Diagnosis not present

## 2015-10-18 DIAGNOSIS — Z9181 History of falling: Secondary | ICD-10-CM

## 2015-10-18 DIAGNOSIS — C787 Secondary malignant neoplasm of liver and intrahepatic bile duct: Secondary | ICD-10-CM | POA: Diagnosis present

## 2015-10-18 DIAGNOSIS — D709 Neutropenia, unspecified: Secondary | ICD-10-CM | POA: Diagnosis present

## 2015-10-18 DIAGNOSIS — D6481 Anemia due to antineoplastic chemotherapy: Secondary | ICD-10-CM | POA: Diagnosis not present

## 2015-10-18 DIAGNOSIS — G893 Neoplasm related pain (acute) (chronic): Secondary | ICD-10-CM | POA: Diagnosis present

## 2015-10-18 DIAGNOSIS — E119 Type 2 diabetes mellitus without complications: Secondary | ICD-10-CM | POA: Diagnosis present

## 2015-10-18 DIAGNOSIS — Z79899 Other long term (current) drug therapy: Secondary | ICD-10-CM | POA: Diagnosis not present

## 2015-10-18 DIAGNOSIS — D701 Agranulocytosis secondary to cancer chemotherapy: Principal | ICD-10-CM | POA: Diagnosis present

## 2015-10-18 DIAGNOSIS — T451X5A Adverse effect of antineoplastic and immunosuppressive drugs, initial encounter: Secondary | ICD-10-CM | POA: Diagnosis present

## 2015-10-18 DIAGNOSIS — R5081 Fever presenting with conditions classified elsewhere: Secondary | ICD-10-CM | POA: Diagnosis not present

## 2015-10-18 DIAGNOSIS — R509 Fever, unspecified: Secondary | ICD-10-CM | POA: Diagnosis not present

## 2015-10-18 DIAGNOSIS — H9190 Unspecified hearing loss, unspecified ear: Secondary | ICD-10-CM | POA: Diagnosis present

## 2015-10-18 DIAGNOSIS — Z7982 Long term (current) use of aspirin: Secondary | ICD-10-CM | POA: Diagnosis not present

## 2015-10-18 DIAGNOSIS — C189 Malignant neoplasm of colon, unspecified: Secondary | ICD-10-CM | POA: Diagnosis present

## 2015-10-18 DIAGNOSIS — A419 Sepsis, unspecified organism: Secondary | ICD-10-CM

## 2015-10-18 DIAGNOSIS — R52 Pain, unspecified: Secondary | ICD-10-CM

## 2015-10-18 DIAGNOSIS — Z9221 Personal history of antineoplastic chemotherapy: Secondary | ICD-10-CM

## 2015-10-18 DIAGNOSIS — Z87891 Personal history of nicotine dependence: Secondary | ICD-10-CM | POA: Diagnosis not present

## 2015-10-18 LAB — GLUCOSE, CAPILLARY
GLUCOSE-CAPILLARY: 128 mg/dL — AB (ref 65–99)
GLUCOSE-CAPILLARY: 111 mg/dL — AB (ref 65–99)
Glucose-Capillary: 149 mg/dL — ABNORMAL HIGH (ref 65–99)

## 2015-10-18 LAB — URINALYSIS COMPLETE WITH MICROSCOPIC (ARMC ONLY)
Bacteria, UA: NONE SEEN
Bilirubin Urine: NEGATIVE
GLUCOSE, UA: NEGATIVE mg/dL
HGB URINE DIPSTICK: NEGATIVE
Ketones, ur: NEGATIVE mg/dL
LEUKOCYTES UA: NEGATIVE
NITRITE: NEGATIVE
Protein, ur: NEGATIVE mg/dL
SQUAMOUS EPITHELIAL / LPF: NONE SEEN
Specific Gravity, Urine: 1.008 (ref 1.005–1.030)
pH: 5 (ref 5.0–8.0)

## 2015-10-18 LAB — APTT: aPTT: 37 seconds — ABNORMAL HIGH (ref 24–36)

## 2015-10-18 LAB — COMPREHENSIVE METABOLIC PANEL
ALK PHOS: 129 U/L — AB (ref 38–126)
ALT: 12 U/L — AB (ref 14–54)
AST: 19 U/L (ref 15–41)
Albumin: 2.9 g/dL — ABNORMAL LOW (ref 3.5–5.0)
Anion gap: 9 (ref 5–15)
BUN: 13 mg/dL (ref 6–20)
CALCIUM: 9.9 mg/dL (ref 8.9–10.3)
CHLORIDE: 105 mmol/L (ref 101–111)
CO2: 23 mmol/L (ref 22–32)
CREATININE: 1.05 mg/dL — AB (ref 0.44–1.00)
GFR, EST NON AFRICAN AMERICAN: 52 mL/min — AB (ref >60)
Glucose, Bld: 120 mg/dL — ABNORMAL HIGH (ref 65–99)
Potassium: 4.2 mmol/L (ref 3.5–5.1)
Sodium: 137 mmol/L (ref 135–145)
Total Bilirubin: 1.1 mg/dL (ref 0.3–1.2)
Total Protein: 6.9 g/dL (ref 6.5–8.1)

## 2015-10-18 LAB — CBC WITH DIFFERENTIAL/PLATELET
BASOS ABS: 0 10*3/uL (ref 0–0.1)
BASOS PCT: 1 %
EOS ABS: 0 10*3/uL (ref 0–0.7)
Eosinophils Relative: 2 %
HEMATOCRIT: 30.1 % — AB (ref 35.0–47.0)
Hemoglobin: 10.4 g/dL — ABNORMAL LOW (ref 12.0–16.0)
LYMPHS ABS: 0.6 10*3/uL — AB (ref 1.0–3.6)
LYMPHS PCT: 65 %
MCH: 30.1 pg (ref 26.0–34.0)
MCHC: 34.6 g/dL (ref 32.0–36.0)
MCV: 87 fL (ref 80.0–100.0)
MONOS PCT: 16 %
Monocytes Absolute: 0.2 10*3/uL (ref 0.2–0.9)
NEUTROS ABS: 0.2 10*3/uL — AB (ref 1.4–6.5)
Neutrophils Relative %: 16 %
PLATELETS: 158 10*3/uL (ref 150–440)
RBC: 3.46 MIL/uL — ABNORMAL LOW (ref 3.80–5.20)
RDW: 16.2 % — AB (ref 11.5–14.5)
WBC: 1 10*3/uL — CL (ref 3.6–11.0)

## 2015-10-18 LAB — AMMONIA: Ammonia: 14 umol/L (ref 9–35)

## 2015-10-18 LAB — TROPONIN I

## 2015-10-18 LAB — LIPASE, BLOOD: LIPASE: 12 U/L (ref 11–51)

## 2015-10-18 LAB — TYPE AND SCREEN
ABO/RH(D): O POS
ANTIBODY SCREEN: NEGATIVE

## 2015-10-18 LAB — PROTIME-INR
INR: 1.39
Prothrombin Time: 17.2 seconds — ABNORMAL HIGH (ref 11.4–15.0)

## 2015-10-18 LAB — LACTIC ACID, PLASMA: Lactic Acid, Venous: 1.5 mmol/L (ref 0.5–1.9)

## 2015-10-18 LAB — BRAIN NATRIURETIC PEPTIDE: B NATRIURETIC PEPTIDE 5: 159 pg/mL — AB (ref 0.0–100.0)

## 2015-10-18 MED ORDER — ACETAMINOPHEN 650 MG RE SUPP
650.0000 mg | Freq: Once | RECTAL | Status: AC
  Administered 2015-10-18: 650 mg via RECTAL
  Filled 2015-10-18: qty 1

## 2015-10-18 MED ORDER — ONDANSETRON HCL 4 MG PO TABS: 4.0000 mg | ORAL_TABLET | Freq: Three times a day (TID) | ORAL | Status: DC | PRN

## 2015-10-18 MED ORDER — DEXTROSE 5 % IV SOLN
2.0000 g | Freq: Two times a day (BID) | INTRAVENOUS | Status: DC
  Administered 2015-10-18 – 2015-10-21 (×6): 2 g via INTRAVENOUS
  Filled 2015-10-18 (×8): qty 2

## 2015-10-18 MED ORDER — AMLODIPINE BESYLATE 5 MG PO TABS
5.0000 mg | ORAL_TABLET | Freq: Every day | ORAL | Status: DC
  Administered 2015-10-18 – 2015-10-19 (×2): 5 mg via ORAL
  Filled 2015-10-18 (×2): qty 1

## 2015-10-18 MED ORDER — ONDANSETRON HCL 4 MG PO TABS: 4.0000 mg | ORAL_TABLET | Freq: Four times a day (QID) | ORAL | Status: DC | PRN

## 2015-10-18 MED ORDER — ATORVASTATIN CALCIUM 20 MG PO TABS
40.0000 mg | ORAL_TABLET | Freq: Every day | ORAL | Status: DC
  Administered 2015-10-18 – 2015-10-22 (×5): 40 mg via ORAL
  Filled 2015-10-18 (×5): qty 2

## 2015-10-18 MED ORDER — GABAPENTIN 100 MG PO CAPS
200.0000 mg | ORAL_CAPSULE | Freq: Every day | ORAL | Status: DC
  Administered 2015-10-18: 16:00:00 100 mg via ORAL
  Administered 2015-10-19 – 2015-10-21 (×3): 200 mg via ORAL
  Filled 2015-10-18 (×5): qty 2

## 2015-10-18 MED ORDER — HEPARIN SODIUM (PORCINE) 5000 UNIT/ML IJ SOLN
5000.0000 [IU] | Freq: Three times a day (TID) | INTRAMUSCULAR | Status: DC
  Administered 2015-10-18 – 2015-10-22 (×12): 5000 [IU] via SUBCUTANEOUS
  Filled 2015-10-18 (×12): qty 1

## 2015-10-18 MED ORDER — DEXTROSE 5 % IV SOLN
2.0000 g | Freq: Once | INTRAVENOUS | Status: AC
  Administered 2015-10-18: 2 g via INTRAVENOUS
  Filled 2015-10-18: qty 2

## 2015-10-18 MED ORDER — VANCOMYCIN HCL IN DEXTROSE 1-5 GM/200ML-% IV SOLN
1000.0000 mg | Freq: Once | INTRAVENOUS | Status: AC
  Administered 2015-10-18: 1000 mg via INTRAVENOUS
  Filled 2015-10-18: qty 200

## 2015-10-18 MED ORDER — ACETAMINOPHEN 650 MG RE SUPP: 650.0000 mg | Freq: Four times a day (QID) | RECTAL | Status: DC | PRN

## 2015-10-18 MED ORDER — MAGNESIUM 200 MG PO TABS
200.0000 mg | ORAL_TABLET | Freq: Every day | ORAL | Status: DC
  Filled 2015-10-18: qty 1

## 2015-10-18 MED ORDER — VANCOMYCIN HCL IN DEXTROSE 1-5 GM/200ML-% IV SOLN: 1000.0000 mg | INTRAVENOUS | Status: DC

## 2015-10-18 MED ORDER — TRAZODONE HCL 50 MG PO TABS: 25.0000 mg | ORAL_TABLET | Freq: Every evening | ORAL | Status: DC | PRN

## 2015-10-18 MED ORDER — INSULIN ASPART 100 UNIT/ML ~~LOC~~ SOLN
0.0000 [IU] | Freq: Three times a day (TID) | SUBCUTANEOUS | Status: DC
  Administered 2015-10-19: 17:00:00 2 [IU] via SUBCUTANEOUS
  Administered 2015-10-19 – 2015-10-20 (×3): 1 [IU] via SUBCUTANEOUS
  Administered 2015-10-21: 2 [IU] via SUBCUTANEOUS
  Administered 2015-10-22 (×2): 1 [IU] via SUBCUTANEOUS
  Filled 2015-10-18 (×4): qty 1
  Filled 2015-10-18 (×2): qty 2
  Filled 2015-10-18: qty 1

## 2015-10-18 MED ORDER — MEGESTROL ACETATE 400 MG/10ML PO SUSP
200.0000 mg | Freq: Every day | ORAL | Status: DC
  Administered 2015-10-18 – 2015-10-22 (×5): 200 mg via ORAL
  Filled 2015-10-18 (×5): qty 5

## 2015-10-18 MED ORDER — ONDANSETRON HCL 4 MG/2ML IJ SOLN: 4.0000 mg | Freq: Four times a day (QID) | INTRAMUSCULAR | Status: DC | PRN

## 2015-10-18 MED ORDER — ADULT MULTIVITAMIN W/MINERALS CH
1.0000 | ORAL_TABLET | Freq: Every day | ORAL | Status: DC
  Administered 2015-10-18 – 2015-10-22 (×5): 1 via ORAL
  Filled 2015-10-18 (×5): qty 1

## 2015-10-18 MED ORDER — SODIUM CHLORIDE 0.9 % IV BOLUS (SEPSIS)
1000.0000 mL | Freq: Once | INTRAVENOUS | Status: AC
  Administered 2015-10-18: 1000 mL via INTRAVENOUS

## 2015-10-18 MED ORDER — SODIUM CHLORIDE 0.9 % IV SOLN
INTRAVENOUS | Status: DC
  Administered 2015-10-18 – 2015-10-19 (×2): via INTRAVENOUS

## 2015-10-18 MED ORDER — DOCUSATE SODIUM 100 MG PO CAPS
100.0000 mg | ORAL_CAPSULE | Freq: Two times a day (BID) | ORAL | Status: DC
  Administered 2015-10-18 – 2015-10-22 (×8): 100 mg via ORAL
  Filled 2015-10-18 (×9): qty 1

## 2015-10-18 MED ORDER — PIPERACILLIN-TAZOBACTAM 3.375 G IVPB
3.3750 g | Freq: Once | INTRAVENOUS | Status: DC
  Filled 2015-10-18: qty 50

## 2015-10-18 MED ORDER — HYDROCODONE-ACETAMINOPHEN 5-325 MG PO TABS
1.0000 | ORAL_TABLET | ORAL | Status: DC | PRN
  Administered 2015-10-19: 1 via ORAL
  Filled 2015-10-18: qty 1

## 2015-10-18 MED ORDER — ACETAMINOPHEN 325 MG PO TABS
650.0000 mg | ORAL_TABLET | Freq: Four times a day (QID) | ORAL | Status: DC | PRN
  Filled 2015-10-18: qty 2

## 2015-10-18 MED ORDER — ASPIRIN EC 81 MG PO TBEC
81.0000 mg | DELAYED_RELEASE_TABLET | Freq: Every day | ORAL | Status: DC
  Administered 2015-10-18 – 2015-10-22 (×5): 81 mg via ORAL
  Filled 2015-10-18 (×5): qty 1

## 2015-10-18 MED ORDER — BISACODYL 5 MG PO TBEC: 5.0000 mg | DELAYED_RELEASE_TABLET | Freq: Every day | ORAL | Status: DC | PRN

## 2015-10-18 MED ORDER — OXYCODONE HCL 5 MG PO TABS
5.0000 mg | ORAL_TABLET | Freq: Four times a day (QID) | ORAL | Status: DC | PRN
  Administered 2015-10-18 – 2015-10-20 (×5): 5 mg via ORAL
  Filled 2015-10-18 (×6): qty 1

## 2015-10-18 MED ORDER — VANCOMYCIN HCL IN DEXTROSE 750-5 MG/150ML-% IV SOLN
750.0000 mg | INTRAVENOUS | Status: DC
  Administered 2015-10-18 – 2015-10-20 (×4): 750 mg via INTRAVENOUS
  Filled 2015-10-18 (×6): qty 150

## 2015-10-18 MED ORDER — LISINOPRIL 10 MG PO TABS
10.0000 mg | ORAL_TABLET | Freq: Every day | ORAL | Status: DC
  Administered 2015-10-18 – 2015-10-22 (×5): 10 mg via ORAL
  Filled 2015-10-18 (×5): qty 1

## 2015-10-18 NOTE — Progress Notes (Signed)
ED visit made. Mrs. Gupta is currently followed by [facility]. She is receiving skilled nursing, physical therapy, home health aide and social work services. She is a Full Code. She came to the ED via EMS for assessment of altered mental status and fever. Patient seen lying on the ED stretcher, daughter [name] present at bedside. Plan is for admission. Hospital care team made aware that patient is followed by [facility]. Palliative Care consult pending. If patient discharges over the weekend she will need resumption of care orders. Will continue to follow through final disposition. Thank you. Flo Shanks RN, BSN, [facility] Liaison [phone] c

## 2015-10-18 NOTE — ED Notes (Signed)
Pt is more alert and responsive at this time. Able to carry on a conversation and tell more information about herself. Pt is still A/O x 2.

## 2015-10-18 NOTE — ED Notes (Signed)
Attempted to access port. Unsuccessful at this time.

## 2015-10-18 NOTE — ED Provider Notes (Signed)
[facility] Emergency Department Provider Note   ____________________________________________  Time seen: Approximately 9:10 AM  I have reviewed the triage vital signs and the nursing notes.   HISTORY  Chief Complaint Altered Mental Status  Caveat-history of present illness and review of systems is limited due to the patient's altered mental status. Information obtained from EMS on arrival.  HPI Sandy [name] is a 70 y.o. 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 Her last chemotherapy was received over a week ago.No other history is obtainable at this time.    Past Medical History  Diagnosis Date  . Hypertension   . Constipation   . Insomnia   . Diabetes mellitus without complication ([place])   . Cancer ([place])     liver mets; colon cancer  . 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 HOH (hard of hearing)   . Wears dentures     partial upper and lower    Patient Active Problem List   Diagnosis Date Noted  . Thyroid nodule 09/24/2015  . Personal history of colon cancer   . Thyroid nodule 08/30/2015  . Malignant neoplasm of sigmoid colon ([place]) 08/27/2015  . Hematochezia 06/10/2015  . Neuropathy ([place]) 04/29/2015  . Muscle pain, lumbar 02/12/2015  . Hypomagnesemia 01/15/2015  . Hypokalemia 10/23/2014  . Hyponatremia 10/21/2014  . Colon cancer ([place]) 09/21/2014  . Metastases to the liver ([place]) 09/21/2014  . Colon cancer metastasized to liver ([place]) 09/18/2014  . Cancer related pain 09/18/2014  . Dysuria 09/18/2014  . Anemia 09/18/2014  . Anorexia 09/18/2014  . Weight loss, unintentional 09/18/2014  . Benign essential HTN 09/03/2014  . Type 2 diabetes mellitus ([place]) 09/03/2014  . Personal history of nicotine dependence 09/03/2014  . Combined fat and carbohydrate induced hyperlipemia 09/03/2014    . Chest pain 08/28/2014    Past Surgical History  Procedure Laterality Date  . Colonoscopy with propofol N/A 09/07/2014    Procedure: COLONOSCOPY WITH PROPOFOL;  Surgeon: Lucilla Lame, MD;  Location: [facility] ENDOSCOPY;  Service: Endoscopy;  Laterality: N/A;  . Esophagogastroduodenoscopy N/A 09/07/2014    Procedure: ESOPHAGOGASTRODUODENOSCOPY (EGD);  Surgeon: Lucilla Lame, MD;  Location: [facility] ENDOSCOPY;  Service: Endoscopy;  Laterality: N/A;  . Portacath placement Left 09/24/2014    Procedure: INSERTION PORT-A-CATH;  Surgeon: Robert Bellow, MD;  Location: [facility] ORS;  Service: General;  Laterality: Left;  . Liver biopsy    . Cardiac catheterization    . Flexible sigmoidoscopy N/A 09/02/2015    Procedure: FLEXIBLE SIGMOIDOSCOPY;  Surgeon: Lucilla Lame, MD;  Location: [facility];  Service: Endoscopy;  Laterality: N/A;  Diabetic - oral meds Pt has port-a-cath    Current Outpatient Rx  Name  Route  Sig  Dispense  Refill  . amLODipine (NORVASC) 5 MG tablet   Oral   Take 5 mg by mouth daily. Reported on 09/24/2015         . aspirin EC 81 MG tablet   Oral   Take 81 mg by mouth daily. Reported on 08/26/2015         . atorvastatin (LIPITOR) 40 MG tablet   Oral   Take 40 mg by mouth daily.         Marland Kitchen gabapentin (NEURONTIN) 100 MG capsule   Oral   Take 2 capsules (200 mg total) by mouth  at bedtime. 1 tab daily and 2 tabs at bedtime.   60 capsule   0   . lidocaine-prilocaine (EMLA) cream   Topical   Apply 1 application topically as needed. Apply to port 1 hour prior to chemotherapy appointment. Cover with plastic wrap.   30 g   1   . lisinopril (PRINIVIL,ZESTRIL) 10 MG tablet   Oral   Take 10 mg by mouth daily.         . magnesium 30 MG tablet   Oral   Take 30 mg by mouth daily.          . megestrol (MEGACE) 400 MG/10ML suspension   Oral   Take 5 mLs (200 mg total) by mouth daily. to stimulate appetite   240 mL   0   . metFORMIN (GLUCOPHAGE) 1000 MG tablet    Oral   Take 1,000 mg by mouth 2 (two) times daily.          . metoprolol (LOPRESSOR) 100 MG tablet   Oral   Take 100 mg by mouth.         . Multiple Vitamin (MULTIVITAMIN WITH MINERALS) TABS tablet   Oral   Take 1 tablet by mouth daily.         . ondansetron (ZOFRAN) 4 MG tablet   Oral   Take 1 tablet (4 mg total) by mouth every 8 (eight) hours as needed for nausea or vomiting.   30 tablet   2   . oxyCODONE (OXY IR/ROXICODONE) 5 MG immediate release tablet   Oral   Take 1 tablet (5 mg total) by mouth every 6 (six) hours as needed for severe pain.   40 tablet   0   . potassium chloride (K-DUR,KLOR-CON) 10 MEQ tablet   Oral   Take 1 tablet (10 mEq total) by mouth once. Take 1 pill twice a day for 3 days then 1 pill a day.   30 tablet   0   . sorbitol 70 % solution   Oral   Take 30 mLs by mouth daily as needed.           Allergies Review of patient's allergies indicates no known allergies.  Family History  Problem Relation Age of Onset  . Cancer Sister     Social History Social History  Substance Use Topics  . Smoking status: Former Smoker -- 0.25 packs/day for 20 years    Types: Cigarettes    Quit date: 09/06/2013  . Smokeless tobacco: Never Used  . Alcohol Use: No    Review of Systems Constitutional: + fever/chills    Caveat-history of present illness and review of systems is limited due to the patient's altered mental status. Information obtained from EMS on arrival. ____________________________________________   PHYSICAL EXAM:  Filed Vitals:   10/18/15 0913 10/18/15 0932 10/18/15 0935 10/18/15 1027  BP: 144/74  149/73 121/83  Pulse: 83  82 80  Temp: 100 F (37.8 C) 102.1 F (38.9 C)    TempSrc: Oral Rectal    Resp: 20  19 20   Height: 5\' 5"  (1.651 m)     Weight: 150 lb (68.04 kg)     SpO2: 100%  99% 99%     Constitutional: Awake and alert, makes eye contact and says "huh" when her name is called and briefly, follows commands  intermittently but unable to answer any questions. Eyes: Conjunctivae are normal. PERRL. EOMI. Head: Atraumatic. Nose: No congestion/rhinnorhea. Mouth/Throat: Mucous membranes are moist.  Oropharynx non-erythematous.  Neck: No stridor. She moves the neck freely and it appears supple without meningismus. Cardiovascular: Normal rate, regular rhythm. Grossly normal heart sounds.  Good peripheral circulation. Respiratory: Normal respiratory effort.  No retractions. Lungs CTAB. Gastrointestinal: Soft and nontender. No distention. No CVA tenderness. Genitourinary: Deferred. Musculoskeletal: No lower extremity tenderness nor edema.  No joint effusions. Neurologic: Moves all extremities spontaneously and equally but only intermittently to command. Does not cooperate with formal neurological testing. Skin:  Skin is warm, dry and intact. No rash noted. Poor and left chest wall without any evidence of erythema/surrounding infection. Psychiatric: Mood and affect are normal. Speech and behavior are normal.  ____________________________________________   LABS (all labs ordered are listed, but only abnormal results are displayed)  Labs Reviewed  COMPREHENSIVE METABOLIC PANEL - Abnormal; Notable for the following:    Glucose, Bld 120 (*)    Creatinine, Ser 1.05 (*)    Albumin 2.9 (*)    ALT 12 (*)    Alkaline Phosphatase 129 (*)    GFR calc non Af Amer 52 (*)    All other components within normal limits  URINALYSIS COMPLETEWITH MICROSCOPIC ([facility] ONLY) - Abnormal; Notable for the following:    Color, Urine YELLOW (*)    APPearance HAZY (*)    All other components within normal limits  CULTURE, BLOOD (ROUTINE X 2)  CULTURE, BLOOD (ROUTINE X 2)  URINE CULTURE  AMMONIA  LACTIC ACID, PLASMA  TROPONIN I  LIPASE, BLOOD  LACTIC ACID, PLASMA  BRAIN NATRIURETIC PEPTIDE  CBC WITH DIFFERENTIAL/PLATELET  APTT  PROTIME-INR  TYPE AND SCREEN   ____________________________________________  EKG  ED  ECG REPORT I, Joanne Gavel, the attending physician, personally viewed and interpreted this ECG.   Date: 10/18/2015  EKG Time: 09:11  Rate: 83  Rhythm:  normal sinus rhythm with premature atrial contractions.  Axis: normal  Intervals:none  ST&T Change: No acute ST elevation. Nonspecific T-wave abnormality in V4, V5, V6.  ____________________________________________  RADIOLOGY  CXR IMPRESSION: No active disease.  CT head IMPRESSION: Chronic atrophic and ischemic changes without acute abnormality. ____________________________________________   PROCEDURES  Procedure(s) performed: None  Procedures  Critical Care performed:   CRITICAL CARE Performed by: Loura Pardon A   Total critical care time: 35 minutes  Critical care time was exclusive of separately billable procedures and treating other patients.  Critical care was necessary to treat or prevent imminent or life-threatening deterioration.  Critical care was time spent personally by me on the following activities: development of treatment plan with patient and/or surrogate as well as nursing, discussions with consultants, evaluation of patient's response to treatment, examination of patient, obtaining history from patient or surrogate, ordering and performing treatments and interventions, ordering and review of laboratory studies, ordering and review of radiographic studies, pulse oximetry and re-evaluation of patient's condition.  ____________________________________________   INITIAL IMPRESSION / ASSESSMENT AND PLAN / ED COURSE  Pertinent labs & imaging results that were available during my care of the patient were reviewed by me and considered in my medical decision making (see chart for details).  Sandy [name] is a 70 y.o. 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 On exam,  she is awake and alert but only is intermittently able to follow commands to move all of her extremities which she appears to do equally. She has a temperature of 100F on arrival which spiked  to 102.1. She is intermittently tachypneic. Code sepsis initiated. We'll give IV fluids, empiric antibiotics, obtain screening labs, EKG and chest x-ray, CT head and anticipate admission.  ----------------------------------------- 10:53 AM on 10/18/2015 ----------------------------------------- Received critical lab notification that the patient's white blood cell count is less than 2. We'll treat for neutropenic fever/sepsis with vancomycin and cefepime. CMP is generally unremarkable, reassuring lactic acid. Urinalysis is not consistent with infection. CT head and chest x-ray show no acute processes. At this time, the exact cause of her fever is not known however could represent bacteremia given her immunocompromised state, she does have a port that was accessed last week in the left chest. Case discussed with hospitalist for admission at this time.  ____________________________________________   FINAL CLINICAL IMPRESSION(S) / ED DIAGNOSES  Final diagnoses:  Neutropenic fever ([place])  Sepsis, due to unspecified organism [facility])      NEW MEDICATIONS STARTED DURING THIS VISIT:  New Prescriptions   No medications on file     Note:  This document was prepared using Dragon voice recognition software and may include unintentional dictation errors.    Joanne Gavel, MD 10/18/15 1056

## 2015-10-18 NOTE — H&P (Signed)
[place] [facility] [place] NAME: Sandy [name]    MR#:  [id number]  DATE OF BIRTH:  [birth year]  DATE OF ADMISSION:  10/18/2015  PRIMARY CARE PHYSICIAN: [name], MD   REQUESTING/REFERRING PHYSICIAN: Dr. Loura Pardon  CHIEF COMPLAINT: Fever    Chief Complaint  Patient presents with  . Altered Mental Status    HISTORY OF PRESENT ILLNESS:  Sandy [name]  is a 70 y.o. female with a known history of Stage IV colon cancer on chemotherapy, last chemotherapy a week ago comes in with fever since yesterday. Temperature was around 100 Fahrenheit at home, patient ambulate in the emergency room on 02 Fahrenheit. No abdominal pain, nausea, vomiting, diarrhea. According to patient's daughter patient had a fall 2 times day before yesterday associated with generalized weakness, fatigue, poor by mouth intake for the past 2 days. Patient also noted to have lethargic since this morning. This morning she called ambulance because of decreased mental status, unable to even stand so they had to carry her on the stretcher. PAST MEDICAL HISTORY:   Past Medical History  Diagnosis Date  . Hypertension   . Constipation   . Insomnia   . Diabetes mellitus without complication ([place])   . Cancer ([place])     liver mets; colon cancer  . 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 HOH (hard of hearing)   . Wears dentures     partial upper and lower    PAST SURGICAL HISTOIRY:   Past Surgical History  Procedure Laterality Date  . Colonoscopy with propofol N/A 09/07/2014    Procedure: COLONOSCOPY WITH PROPOFOL;  Surgeon: Lucilla Lame, MD;  Location: [facility] ENDOSCOPY;  Service: Endoscopy;  Laterality: N/A;  . Esophagogastroduodenoscopy N/A 09/07/2014    Procedure: ESOPHAGOGASTRODUODENOSCOPY (EGD);  Surgeon: Lucilla Lame, MD;  Location: [facility] ENDOSCOPY;  Service: Endoscopy;  Laterality: N/A;  . Portacath placement Left 09/24/2014    Procedure:  INSERTION PORT-A-CATH;  Surgeon: Robert Bellow, MD;  Location: [facility] ORS;  Service: General;  Laterality: Left;  . Liver biopsy    . Cardiac catheterization    . Flexible sigmoidoscopy N/A 09/02/2015    Procedure: FLEXIBLE SIGMOIDOSCOPY;  Surgeon: Lucilla Lame, MD;  Location: [facility];  Service: Endoscopy;  Laterality: N/A;  Diabetic - oral meds Pt has port-a-cath    SOCIAL HISTORY:   Social History  Substance Use Topics  . Smoking status: Former Smoker -- 0.25 packs/day for 20 years    Types: Cigarettes    Quit date: 09/06/2013  . Smokeless tobacco: Never Used  . Alcohol Use: No    FAMILY HISTORY:   Family History  Problem Relation Age of Onset  . Cancer Sister     DRUG ALLERGIES:  No Known Allergies  REVIEW OF SYSTEMS:  CONSTITUTIONAL: Fever, fatigue, weakness. Patient is hard of hearing, able to answer questions appropriately at this time. EYES: No blurred or double vision. Appears pale. EARS, NOSE, AND THROAT: No tinnitus or ear pain.  RESPIRATORY: No cough, shortness of breath, wheezing or hemoptysis.  CARDIOVASCULAR: No chest pain, orthopnea, edema.  GASTROINTESTINAL: No nausea, vomiting, diarrhea or abdominal pain.  GENITOURINARY: No dysuria, hematuria.  ENDOCRINE: No polyuria, nocturia,  HEMATOLOGY: No anemia, easy bruising or bleeding SKIN: No rash or lesion. MUSCULOSKELETAL: No joint pain or arthritis.   NEUROLOGIC: No tingling, numbness, weakness.  PSYCHIATRY: No anxiety or depression.   MEDICATIONS AT HOME:   Prior  to Admission medications   Medication Sig Start Date End Date Taking? Authorizing Provider  amLODipine (NORVASC) 5 MG tablet Take 5 mg by mouth daily. Reported on 09/24/2015   Yes Historical Provider, MD  aspirin EC 81 MG tablet Take 81 mg by mouth daily. Reported on 08/26/2015   Yes Historical Provider, MD  atorvastatin (LIPITOR) 40 MG tablet Take 40 mg by mouth daily.   Yes Historical Provider, MD  gabapentin (NEURONTIN) 100 MG  capsule Take 2 capsules (200 mg total) by mouth at bedtime. 1 tab daily and 2 tabs at bedtime. 07/16/15  Yes Lequita Asal, MD  lidocaine-prilocaine (EMLA) cream Apply 1 application topically as needed. Apply to port 1 hour prior to chemotherapy appointment. Cover with plastic wrap. 07/08/15  Yes Lequita Asal, MD  lisinopril (PRINIVIL,ZESTRIL) 10 MG tablet Take 10 mg by mouth daily.   Yes Historical Provider, MD  magnesium 30 MG tablet Take 30 mg by mouth daily.    Yes Historical Provider, MD  megestrol (MEGACE) 400 MG/10ML suspension Take 5 mLs (200 mg total) by mouth daily. to stimulate appetite 11/06/14  Yes Lequita Asal, MD  metFORMIN (GLUCOPHAGE) 1000 MG tablet Take 1,000 mg by mouth 2 (two) times daily.    Yes Historical Provider, MD  metoprolol (LOPRESSOR) 100 MG tablet Take 100 mg by mouth.   Yes Historical Provider, MD  Multiple Vitamin (MULTIVITAMIN WITH MINERALS) TABS tablet Take 1 tablet by mouth daily.   Yes Historical Provider, MD  ondansetron (ZOFRAN) 4 MG tablet Take 1 tablet (4 mg total) by mouth every 8 (eight) hours as needed for nausea or vomiting. 12/13/14  Yes Lequita Asal, MD  oxyCODONE (OXY IR/ROXICODONE) 5 MG immediate release tablet Take 1 tablet (5 mg total) by mouth every 6 (six) hours as needed for severe pain. 10/14/15  Yes Lequita Asal, MD  potassium chloride (K-DUR,KLOR-CON) 10 MEQ tablet Take 1 tablet (10 mEq total) by mouth once. Take 1 pill twice a day for 3 days then 1 pill a day. 03/11/15  Yes Lequita Asal, MD  sorbitol 70 % solution Take 30 mLs by mouth daily as needed.   Yes Historical Provider, MD      VITAL SIGNS:  Blood pressure 133/58, pulse 81, temperature 102.1 F (38.9 C), temperature source Rectal, resp. rate 18, height 5\' 5"  (1.651 m), weight 68.04 kg (150 lb), SpO2 99 %.  PHYSICAL EXAMINATION:  GENERAL:  70 y.o.-year-old patient lying in the bed with no acute distress.  EYES: Pupils equal, round, reactive to light and  accommodation. No scleral icterus. Extraocular muscles intact.  HEENT: Head atraumatic, normocephalic. Oropharynx and nasopharynx clear.  NECK:  Supple, no jugular venous distention. No thyroid enlargement, no tenderness.  LUNGS: Normal breath sounds bilaterally, no wheezing, rales,rhonchi or crepitation. No use of accessory muscles of respiration.  CARDIOVASCULAR: S1, S2 normal. No murmurs, rubs, or gallops.  ABDOMEN: Soft, Mild left lower quadrant tenderness present, no rebound tenderness., nondistended. Bowel sounds present. No organomegaly or mass.  EXTREMITIES: No pedal edema, cyanosis, or clubbing.  NEUROLOGIC: Cranial nerves II through XII are intact. Muscle strength 5/5 in all extremities. Sensation intact. Gait not checked.  PSYCHIATRIC: The patient is alert and oriented x 3.  SKIN: No obvious rash, lesion, or ulcer.   LABORATORY PANEL:   CBC  Recent Labs Lab 10/18/15 0921  WBC 1.0*  HGB 10.4*  HCT 30.1*  PLT 158   ------------------------------------------------------------------------------------------------------------------  Chemistries   Recent Labs Lab 10/18/15 [phone]  NA 137  K 4.2  CL 105  CO2 23  GLUCOSE 120*  BUN 13  CREATININE 1.05*  CALCIUM 9.9  AST 19  ALT 12*  ALKPHOS 129*  BILITOT 1.1   ------------------------------------------------------------------------------------------------------------------  Cardiac Enzymes  Recent Labs Lab 10/18/15 0921  TROPONINI <0.03   ------------------------------------------------------------------------------------------------------------------  RADIOLOGY:  Ct Head Wo Contrast  10/18/2015  CLINICAL DATA:  Decreased mental status for several days, history of stage IV liver carcinoma with recent chemotherapy EXAM: CT HEAD WITHOUT CONTRAST TECHNIQUE: Contiguous axial images were obtained from the base of the skull through the vertex without intravenous contrast. COMPARISON:  None. FINDINGS: Bony calvarium is  intact. Mild atrophic changes are noted. Prior lacunar infarct is noted adjacent to the head of the caudate nucleus on the right inferiorly. No findings to suggest acute hemorrhage, acute infarction or space-occupying mass lesion are noted. IMPRESSION: Chronic atrophic and ischemic changes without acute abnormality. Electronically Signed   By: Inez Catalina M.D.   On: 10/18/2015 10:27   Dg Chest Port 1 View  10/18/2015  CLINICAL DATA:  Sepsis EXAM: PORTABLE CHEST 1 VIEW COMPARISON:  10/07/2015 FINDINGS: Cardiomediastinal silhouette is stable. No infiltrate or pulmonary edema. There is left subclavian Port-A-Cath with tip in right atrium. No pneumothorax. IMPRESSION: No active disease. Electronically Signed   By: Lahoma Crocker M.D.   On: 10/18/2015 09:54    EKG:   Orders placed or performed during the hospital encounter of 10/18/15  . ED EKG 12-Lead  . ED EKG 12-Lead   Normal sinus rhythm with 83 bpm. T wave inversion in V5, V6, V4. Has some PACs.  IMPRESSION AND PLAN:  # metastatic colon cancer on chemotherapy with fever, lethargy, neutropenia  Admitted to hospitalist service for neutropenic fever, start cefepime, vancomycin for broad-spectrum coverage, follow blood cultures, urine cultures. Patient port at this time is not the looking like infected. Continue IV hydration. Chest x-ray did not show pneumonia and also ua is clean, #2 diabetes mellitus type 2: Hold metformin because of her lethargy, poor by mouth intake and being acutely  ill this time. Continue sliding scale with coverage only. #3 essential hypertension: Controlled. #4. Metastatic colon cancer getting chemotherapy. Patient received a dose of G-CSF yesterday, consult oncology. Anorexia: Patient is on Megace. #5 history of falls physical therapy consult.  All the records are reviewed and case discussed with ED provider. Management plans discussed with the patient, family and they are in agreement.  CODE STATUS: full  TOTAL TIME  TAKING CARE OF THIS PATIENT: 73minutes.    Epifanio Lesches M.D on 10/18/2015 at 11:33 AM  Between 7am to 6pm - Pager - [phone]  After 6pm go to [email or web address] - password [email or web address] [facility]  Office  [phone]  CC: Primary care physician; [name], MD  Note: This dictation was prepared with Dragon dictation along with smaller phrase technology. Any transcriptional errors that result from this process are unintentional.

## 2015-10-18 NOTE — Progress Notes (Signed)
Pharmacy Antibiotic Note  Sandy [name] is a 70 y.o. female admitted on 10/18/2015 with febrile neutropenia.  Pharmacy has been consulted for vancomycin and cefepime dosing.   Plan: Cefepime 2 g IV q12h based on renal function  Patient received vancomycin 1000 mg dose in ED on 7/7. Will follow with vancomycin 750 mg IV q18h (stacked dose to begin 8 hours after initial dose) Goal vancomycin trough 15-20 mcg/mL Vancomycin trough scheduled for 7/9 @ 2300  Kinetics: Using actual body weight of 64 kg Ke: 0.040 Half life: 17 hrs Vd: ~45L Cmin (estimated): ~16 mcg/mL  Height: 5\' 4"  (162.6 cm) Weight: 141 lb (63.957 kg) IBW/kg (Calculated) : 54.7  Temp (24hrs), Avg:99.5 F (37.5 C), Min:97.8 F (36.6 C), Max:102.1 F (38.9 C)   Recent Labs Lab 10/14/15 0950 10/18/15 0921  WBC 1.1* 1.0*  CREATININE 1.03* 1.05*  LATICACIDVEN  --  1.5    Estimated Creatinine Clearance: 42.4 mL/min (by C-G formula based on Cr of 1.05).    No Known Allergies  Antimicrobials this admission: vancomycin 7/7 >>  cefepime 7/7 >>   Dose adjustments this admission:  Microbiology results: 7/7 BCx: Sent 7/7 UCx: Sent  7/7 MRSA PCR: Sent  Thank you for allowing pharmacy to be a part of this patient's care.  Lenis Noon, PharmD, BCPS Clinical Pharmacist 10/18/2015 2:22 PM

## 2015-10-18 NOTE — ED Notes (Signed)
Pt arrived via EMS from home with family.  Per EMS, daughter states the patient has had decreased mental status for the past 2-3 days.  Pt is responsive to pain and movement, but is unable to answer questions.  Pt has hx of Stage IV Liver CA and received chemo every Tuesday. Last was last Tuesday.  She also receives injections for "her blood levels" but is not sure what the medication is called.  Pt was unable to stand and had to be carried onto the stretcher.  Per EMS daughter told them she is due for labs today at the cancer center and has an appt at 1015.

## 2015-10-19 ENCOUNTER — Inpatient Hospital Stay: Payer: Medicare Other

## 2015-10-19 DIAGNOSIS — C189 Malignant neoplasm of colon, unspecified: Secondary | ICD-10-CM

## 2015-10-19 DIAGNOSIS — R197 Diarrhea, unspecified: Secondary | ICD-10-CM

## 2015-10-19 DIAGNOSIS — G629 Polyneuropathy, unspecified: Secondary | ICD-10-CM

## 2015-10-19 DIAGNOSIS — M545 Low back pain: Secondary | ICD-10-CM

## 2015-10-19 DIAGNOSIS — R4182 Altered mental status, unspecified: Secondary | ICD-10-CM

## 2015-10-19 DIAGNOSIS — Z87891 Personal history of nicotine dependence: Secondary | ICD-10-CM

## 2015-10-19 DIAGNOSIS — Z79899 Other long term (current) drug therapy: Secondary | ICD-10-CM

## 2015-10-19 DIAGNOSIS — R5081 Fever presenting with conditions classified elsewhere: Secondary | ICD-10-CM

## 2015-10-19 DIAGNOSIS — R531 Weakness: Secondary | ICD-10-CM

## 2015-10-19 DIAGNOSIS — C787 Secondary malignant neoplasm of liver and intrahepatic bile duct: Secondary | ICD-10-CM

## 2015-10-19 DIAGNOSIS — T451X5S Adverse effect of antineoplastic and immunosuppressive drugs, sequela: Secondary | ICD-10-CM

## 2015-10-19 DIAGNOSIS — I10 Essential (primary) hypertension: Secondary | ICD-10-CM

## 2015-10-19 DIAGNOSIS — D709 Neutropenia, unspecified: Secondary | ICD-10-CM

## 2015-10-19 DIAGNOSIS — R5383 Other fatigue: Secondary | ICD-10-CM

## 2015-10-19 DIAGNOSIS — G4700 Insomnia, unspecified: Secondary | ICD-10-CM

## 2015-10-19 DIAGNOSIS — K5900 Constipation, unspecified: Secondary | ICD-10-CM

## 2015-10-19 DIAGNOSIS — Z9221 Personal history of antineoplastic chemotherapy: Secondary | ICD-10-CM

## 2015-10-19 DIAGNOSIS — Z7984 Long term (current) use of oral hypoglycemic drugs: Secondary | ICD-10-CM

## 2015-10-19 DIAGNOSIS — Z7982 Long term (current) use of aspirin: Secondary | ICD-10-CM

## 2015-10-19 LAB — CBC
HCT: 24.7 % — ABNORMAL LOW (ref 35.0–47.0)
Hemoglobin: 8.3 g/dL — ABNORMAL LOW (ref 12.0–16.0)
MCH: 29.9 pg (ref 26.0–34.0)
MCHC: 33.6 g/dL (ref 32.0–36.0)
MCV: 89 fL (ref 80.0–100.0)
PLATELETS: 143 10*3/uL — AB (ref 150–440)
RBC: 2.77 MIL/uL — AB (ref 3.80–5.20)
RDW: 15.9 % — ABNORMAL HIGH (ref 11.5–14.5)
WBC: 1 10*3/uL — CL (ref 3.6–11.0)

## 2015-10-19 LAB — BASIC METABOLIC PANEL
ANION GAP: 4 — AB (ref 5–15)
BUN: 13 mg/dL (ref 6–20)
CALCIUM: 9.1 mg/dL (ref 8.9–10.3)
CHLORIDE: 115 mmol/L — AB (ref 101–111)
CO2: 21 mmol/L — AB (ref 22–32)
CREATININE: 0.77 mg/dL (ref 0.44–1.00)
GFR calc Af Amer: >60 mL/min (ref >60)
GFR calc non Af Amer: >60 mL/min (ref >60)
GLUCOSE: 117 mg/dL — AB (ref 65–99)
Potassium: 3.8 mmol/L (ref 3.5–5.1)
SODIUM: 140 mmol/L (ref 135–145)

## 2015-10-19 LAB — URINE CULTURE: Culture: NO GROWTH

## 2015-10-19 LAB — GLUCOSE, CAPILLARY
GLUCOSE-CAPILLARY: 132 mg/dL — AB (ref 65–99)
GLUCOSE-CAPILLARY: 129 mg/dL — AB (ref 65–99)
GLUCOSE-CAPILLARY: 194 mg/dL — AB (ref 65–99)
Glucose-Capillary: 198 mg/dL — ABNORMAL HIGH (ref 65–99)
Glucose-Capillary: 201 mg/dL — ABNORMAL HIGH (ref 65–99)

## 2015-10-19 LAB — MRSA PCR SCREENING: MRSA BY PCR: NEGATIVE

## 2015-10-19 MED ORDER — ENSURE ENLIVE PO LIQD
237.0000 mL | Freq: Two times a day (BID) | ORAL | Status: DC
  Administered 2015-10-19 – 2015-10-22 (×6): 237 mL via ORAL

## 2015-10-19 MED ORDER — TBO-FILGRASTIM 300 MCG/0.5ML ~~LOC~~ SOSY
300.0000 ug | PREFILLED_SYRINGE | Freq: Every day | SUBCUTANEOUS | Status: DC
  Administered 2015-10-19 – 2015-10-20 (×2): 300 ug via SUBCUTANEOUS
  Filled 2015-10-19 (×2): qty 0.5

## 2015-10-19 MED ORDER — MAGNESIUM GLUCONATE 500 MG PO TABS
500.0000 mg | ORAL_TABLET | Freq: Every day | ORAL | Status: DC
  Administered 2015-10-19 – 2015-10-22 (×4): 500 mg via ORAL
  Filled 2015-10-19 (×4): qty 1

## 2015-10-19 MED ORDER — MORPHINE SULFATE (PF) 2 MG/ML IV SOLN
2.0000 mg | Freq: Once | INTRAVENOUS | Status: DC
  Filled 2015-10-19: qty 1

## 2015-10-19 NOTE — Progress Notes (Signed)
[facility] NAME: Sandy [name]    MR#:  [id number]  DATE OF BIRTH:  [birth year]  SUBJECTIVE:  CHIEF COMPLAINT:   Chief Complaint  Patient presents with  . 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 Found to have neutropenia. No clear source of infection. Patient is much alert and oriented currently.  REVIEW OF SYSTEMS:  CONSTITUTIONAL: No fever, fatigue or weakness.  EYES: No blurred or double vision.  EARS, NOSE, AND THROAT: No tinnitus or ear pain.  RESPIRATORY: No cough, shortness of breath, wheezing or hemoptysis.  CARDIOVASCULAR: No chest pain, orthopnea, edema.  GASTROINTESTINAL: No nausea, vomiting, diarrhea or abdominal pain.  GENITOURINARY: No dysuria, hematuria.  ENDOCRINE: No polyuria, nocturia,  HEMATOLOGY: No anemia, easy bruising or bleeding SKIN: No rash or lesion. MUSCULOSKELETAL: No joint pain or arthritis.   NEUROLOGIC: No tingling, numbness, weakness.  PSYCHIATRY: No anxiety or depression.   ROS  DRUG ALLERGIES:  No Known Allergies  VITALS:  Blood pressure 130/68, pulse 66, temperature 98.6 F (37 C), temperature source Oral, resp. rate 20, height 5\' 4"  (1.626 m), weight 64.501 kg (142 lb 3.2 oz), SpO2 100 %.  PHYSICAL EXAMINATION:  GENERAL:  70 y.o.-year-old patient lying in the bed with no acute distress.  EYES: Pupils equal, round, reactive to light and accommodation. No scleral icterus. Extraocular muscles intact.  HEENT: Head atraumatic, normocephalic. Oropharynx and nasopharynx clear.  NECK:  Supple, no jugular venous distention. No thyroid enlargement, no tenderness.  LUNGS: Normal breath sounds bilaterally, no wheezing, rales,rhonchi or crepitation. No use of accessory muscles of respiration.  CARDIOVASCULAR: S1, S2 normal. No murmurs, rubs, or gallops.  ABDOMEN: Soft, nontender, nondistended. Bowel sounds present. No organomegaly or  mass.  EXTREMITIES: No pedal edema, cyanosis, or clubbing.  NEUROLOGIC: Cranial nerves II through XII are intact. Muscle strength 4/5 in all extremities. Sensation intact. Gait not checked.  PSYCHIATRIC: The patient is alert and oriented x 3.  SKIN: No obvious rash, lesion, or ulcer.   Physical Exam LABORATORY PANEL:   CBC  Recent Labs Lab 10/19/15 0356  WBC 1.0*  HGB 8.3*  HCT 24.7*  PLT 143*   ------------------------------------------------------------------------------------------------------------------  Chemistries   Recent Labs Lab 10/18/15 0921 10/19/15 0356  NA 137 140  K 4.2 3.8  CL 105 115*  CO2 23 21*  GLUCOSE 120* 117*  BUN 13 13  CREATININE 1.05* 0.77  CALCIUM 9.9 9.1  AST 19  --   ALT 12*  --   ALKPHOS 129*  --   BILITOT 1.1  --    ------------------------------------------------------------------------------------------------------------------  Cardiac Enzymes  Recent Labs Lab 10/18/15 0921  TROPONINI <0.03   ------------------------------------------------------------------------------------------------------------------  RADIOLOGY:  Ct Head Wo Contrast  10/18/2015  CLINICAL DATA:  Decreased mental status for several days, history of stage IV liver carcinoma with recent chemotherapy EXAM: CT HEAD WITHOUT CONTRAST TECHNIQUE: Contiguous axial images were obtained from the base of the skull through the vertex without intravenous contrast. COMPARISON:  None. FINDINGS: Bony calvarium is intact. Mild atrophic changes are noted. Prior lacunar infarct is noted adjacent to the head of the caudate nucleus on the right inferiorly. No findings to suggest acute hemorrhage, acute infarction or space-occupying mass lesion are noted. IMPRESSION: Chronic atrophic and ischemic changes without acute abnormality. Electronically Signed   By: Inez Catalina M.D.   On: 10/18/2015 10:27   Dg Chest Port 1 View  10/18/2015  CLINICAL  DATA:  Sepsis EXAM: PORTABLE CHEST 1 VIEW  COMPARISON:  10/07/2015 FINDINGS: Cardiomediastinal silhouette is stable. No infiltrate or pulmonary edema. There is left subclavian Port-A-Cath with tip in right atrium. No pneumothorax. IMPRESSION: No active disease. Electronically Signed   By: Lahoma Crocker M.D.   On: 10/18/2015 09:54    ASSESSMENT AND PLAN:   Active Problems:   Neutropenic fever (HCC)  * Neutropenic fever   She is currently on cefepime and vancomycin IV.   Urinalysis, chest x-ray are negative. Blood culture is sent negative so far.    * Diabetes mellitus type 2   Hold metformin and continue insulin sliding scale coverage.  * Metastatic colon cancer getting chemotherapy   Presented with the neutropenia, chemotherapy was last week.   Oncology consult.  * Essential hypertension   Controlled, continue home medication.  * History of fall and complaining of back pain   Thoracic and lumbar spine x-ray. Physical therapy evaluation.     All the records are reviewed and case discussed with Care Management/Social Workerr. Management plans discussed with the patient, family and they are in agreement.  CODE STATUS: Full code  TOTAL TIME TAKING CARE OF THIS PATIENT: 35 inutes.   POSSIBLE D/C IN 1-2 DAYS, DEPENDING ON CLINICAL CONDITION. I called patient's daughter and spoke to her on phone to discuss patient's condition and plan.   Sandy [name] M.D on 10/19/2015   Between 7am to 6pm - Pager - [phone]  After 6pm go to [email or web address] - password [email or web address] [facility]  Office  [phone]  CC: Primary care physician; Sandy Sia, MD  Note: This dictation was prepared with Dragon dictation along with smaller phrase technology. Any transcriptional errors that result from this process are unintentional.

## 2015-10-19 NOTE — Consult Note (Signed)
Sandy [name] is an 70 y.o. female.   Chief Complaint  Patient presents with  . Altered Mental Status   HPI:  Sandy [name] is a 70 year old female with stage IV colon cancer who was admitted through the emergency Department with altered mental status and neutropenic fever. Patient received FOLFIRI on 10/08/2015. Patient developed some diarrhea secondary to chemotherapy but has been using Imodium. Patient was last evaluated in our clinic on 10/14/2015. At that time she was noted to have a critical McComb of 400 and a total WBC of 1.1. She was subsequently started on Granix/Neupogen 300 g subcutaneous injections. Patient was scheduled for outpatient follow-up for daily Neupogen/Granix injections but did not show up for appointments, daughter stated that her mom was very fatigued and confused. She was then encouraged to call EMS for transport to the emergency department for evaluation.   At this time patient reports her pain being a 3/10 in the lower back. She states that she feels she has come out of her confusion but she does not remember how she was transported to the emergency department. She states her appetite is good. She denies any fevers, chills, shortness of breath, chest pain, cough, sputum production. Patient does note that her back pain increases when she starts to urinate.  Past Medical History: Past Medical History  Diagnosis Date  . Hypertension   . Constipation   . Insomnia   . Diabetes mellitus without complication ([place])   . Cancer ([place])     liver mets; colon cancer  . 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 HOH (hard of hearing)   . Wears dentures     partial upper and lower    Past Surgical History: Past Surgical History  Procedure Laterality Date  . Colonoscopy with propofol N/A 09/07/2014    Procedure: COLONOSCOPY WITH PROPOFOL;  Surgeon: Lucilla Lame, MD;  Location: [facility] ENDOSCOPY;  Service: Endoscopy;  Laterality: N/A;  .  Esophagogastroduodenoscopy N/A 09/07/2014    Procedure: ESOPHAGOGASTRODUODENOSCOPY (EGD);  Surgeon: Lucilla Lame, MD;  Location: [facility] ENDOSCOPY;  Service: Endoscopy;  Laterality: N/A;  . Portacath placement Left 09/24/2014    Procedure: INSERTION PORT-A-CATH;  Surgeon: Robert Bellow, MD;  Location: [facility] ORS;  Service: General;  Laterality: Left;  . Liver biopsy    . Cardiac catheterization    . Flexible sigmoidoscopy N/A 09/02/2015    Procedure: FLEXIBLE SIGMOIDOSCOPY;  Surgeon: Lucilla Lame, MD;  Location: [facility] [place];  Service: Endoscopy;  Laterality: N/A;  Diabetic - oral meds Pt has port-a-cath    Family History: Family History  Problem Relation Age of Onset  . Cancer Sister     Social History:  reports that she quit smoking about 2 years ago. Her smoking use included Cigarettes. She has a 5 pack-year smoking history. She has never used smokeless tobacco. She reports that she does not drink alcohol or use illicit drugs.  Allergies: No Known Allergies  Medications Prior to Admission  Medication Sig Dispense Refill  . amLODipine (NORVASC) 5 MG tablet Take 5 mg by mouth daily. Reported on 09/24/2015    . aspirin EC 81 MG tablet Take 81 mg by mouth daily. Reported on 08/26/2015    . atorvastatin (LIPITOR) 40 MG tablet Take 40 mg by mouth daily.    Marland Kitchen gabapentin (NEURONTIN) 100 MG capsule Take 2 capsules (200 mg total) by mouth at bedtime. 1 tab daily and 2 tabs at bedtime. 60 capsule 0  .  lidocaine-prilocaine (EMLA) cream Apply 1 application topically as needed. Apply to port 1 hour prior to chemotherapy appointment. Cover with plastic wrap. 30 g 1  . lisinopril (PRINIVIL,ZESTRIL) 10 MG tablet Take 10 mg by mouth daily.    . magnesium 30 MG tablet Take 30 mg by mouth daily.     . megestrol (MEGACE) 400 MG/10ML suspension Take 5 mLs (200 mg total) by mouth daily. to stimulate appetite 240 mL 0  . metFORMIN (GLUCOPHAGE) 1000 MG tablet Take 1,000 mg by mouth 2 (two) times daily.      . metoprolol (LOPRESSOR) 100 MG tablet Take 100 mg by mouth.    . Multiple Vitamin (MULTIVITAMIN WITH MINERALS) TABS tablet Take 1 tablet by mouth daily.    . ondansetron (ZOFRAN) 4 MG tablet Take 1 tablet (4 mg total) by mouth every 8 (eight) hours as needed for nausea or vomiting. 30 tablet 2  . oxyCODONE (OXY IR/ROXICODONE) 5 MG immediate release tablet Take 1 tablet (5 mg total) by mouth every 6 (six) hours as needed for severe pain. 40 tablet 0  . potassium chloride (K-DUR,KLOR-CON) 10 MEQ tablet Take 1 tablet (10 mEq total) by mouth once. Take 1 pill twice a day for 3 days then 1 pill a day. 30 tablet 0  . sorbitol 70 % solution Take 30 mLs by mouth daily as needed.      Results for orders placed or performed during the hospital encounter of 10/18/15 (from the past 48 hour(s))  Ammonia     Status: None   Collection Time: 10/18/15  9:20 AM  Result Value Ref Range   Ammonia 14 9 - 35 umol/L    Comment: HEMOLYSIS AT THIS LEVEL MAY AFFECT RESULT  Blood Culture (routine x 2)     Status: None (Preliminary result)   Collection Time: 10/18/15  9:20 AM  Result Value Ref Range   Specimen Description BLOOD RIGHT HAND    Special Requests [facility]    Culture NO GROWTH < 24 HOURS    Report Status PENDING   Lactic acid, plasma     Status: None   Collection Time: 10/18/15  9:21 AM  Result Value Ref Range   Lactic Acid, Venous 1.5 0.5 - 1.9 mmol/L  Comprehensive metabolic panel     Status: Abnormal   Collection Time: 10/18/15  9:21 AM  Result Value Ref Range   Sodium 137 135 - 145 mmol/L   Potassium 4.2 3.5 - 5.1 mmol/L   Chloride 105 101 - 111 mmol/L   CO2 23 22 - 32 mmol/L   Glucose, Bld 120 (H) 65 - 99 mg/dL   BUN 13 6 - 20 mg/dL   Creatinine, Ser 1.05 (H) 0.44 - 1.00 mg/dL   Calcium 9.9 8.9 - 10.3 mg/dL   Total Protein 6.9 6.5 - 8.1 g/dL   Albumin 2.9 (L) 3.5 - 5.0 g/dL   AST 19 15 - 41 U/L   ALT 12 (L) 14 - 54 U/L   Alkaline Phosphatase 129 (H) 38 - 126 U/L   Total Bilirubin  1.1 0.3 - 1.2 mg/dL   GFR calc non Af Amer 52 (L) >60 mL/min   GFR calc Af Amer >60 >60 mL/min    Comment: (NOTE) The eGFR has been calculated using the CKD EPI equation. This calculation has not been validated in all clinical situations. eGFR's persistently <60 mL/min signify possible Chronic Kidney Disease.    Anion gap 9 5 - 15  Brain natriuretic peptide  Status: Abnormal   Collection Time: 10/18/15  9:21 AM  Result Value Ref Range   B Natriuretic Peptide 159.0 (H) 0.0 - 100.0 pg/mL  Troponin I     Status: None   Collection Time: 10/18/15  9:21 AM  Result Value Ref Range   Troponin I <0.03 <0.03 ng/mL  CBC WITH DIFFERENTIAL     Status: Abnormal   Collection Time: 10/18/15  9:21 AM  Result Value Ref Range   WBC 1.0 (LL) 3.6 - 11.0 K/uL    Comment: CRITICAL RESULT CALLED TO, READ BACK BY AND VERIFIED WITH: DONALD SWEENEY 10/18/15 1015 [place]    RBC 3.46 (L) 3.80 - 5.20 MIL/uL   Hemoglobin 10.4 (L) 12.0 - 16.0 g/dL   HCT 30.1 (L) 35.0 - 47.0 %   MCV 87.0 80.0 - 100.0 fL   MCH 30.1 26.0 - 34.0 pg   MCHC 34.6 32.0 - 36.0 g/dL   RDW 16.2 (H) 11.5 - 14.5 %   Platelets 158 150 - 440 K/uL   Neutrophils Relative % 16 %   Lymphocytes Relative 65 %   Monocytes Relative 16 %   Eosinophils Relative 2 %   Basophils Relative 1 %   Neutro Abs 0.2 (L) 1.4 - 6.5 K/uL   Lymphs Abs 0.6 (L) 1.0 - 3.6 K/uL   Monocytes Absolute 0.2 0.2 - 0.9 K/uL   Eosinophils Absolute 0.0 0 - 0.7 K/uL   Basophils Absolute 0.0 0 - 0.1 K/uL  Type and screen [facility]     Status: None   Collection Time: 10/18/15  9:21 AM  Result Value Ref Range   ABO/RH(D) O POS    Antibody Screen NEG    Sample Expiration 10/21/2015   APTT     Status: Abnormal   Collection Time: 10/18/15  9:21 AM  Result Value Ref Range   aPTT 37 (H) 24 - 36 seconds    Comment:        IF BASELINE aPTT IS ELEVATED, SUGGEST PATIENT RISK ASSESSMENT BE USED TO DETERMINE APPROPRIATE ANTICOAGULANT THERAPY.    Protime-INR     Status: Abnormal   Collection Time: 10/18/15  9:21 AM  Result Value Ref Range   Prothrombin Time 17.2 (H) 11.4 - 15.0 seconds   INR 1.39   Blood Culture (routine x 2)     Status: None (Preliminary result)   Collection Time: 10/18/15  9:21 AM  Result Value Ref Range   Specimen Description BLOOD LEFT ANTECUBITAL    Special Requests BOTTLES DRAWN AEROBIC AND ANAEROBIC 8CC    Culture NO GROWTH < 24 HOURS    Report Status PENDING   Urinalysis complete, with microscopic ([facility] only)     Status: Abnormal   Collection Time: 10/18/15  9:21 AM  Result Value Ref Range   Color, Urine YELLOW (A) YELLOW   APPearance HAZY (A) CLEAR   Glucose, UA NEGATIVE NEGATIVE mg/dL   Bilirubin Urine NEGATIVE NEGATIVE   Ketones, ur NEGATIVE NEGATIVE mg/dL   Specific Gravity, Urine 1.008 1.005 - 1.030   Hgb urine dipstick NEGATIVE NEGATIVE   pH 5.0 5.0 - 8.0   Protein, ur NEGATIVE NEGATIVE mg/dL   Nitrite NEGATIVE NEGATIVE   Leukocytes, UA NEGATIVE NEGATIVE   RBC / HPF 0-5 0 - 5 RBC/hpf   WBC, UA 0-5 0 - 5 WBC/hpf   Bacteria, UA NONE SEEN NONE SEEN   Squamous Epithelial / LPF NONE SEEN NONE SEEN   Mucous PRESENT    Amorphous Crystal  PRESENT   Urine culture     Status: None   Collection Time: 10/18/15  9:21 AM  Result Value Ref Range   Specimen Description URINE, CLEAN CATCH    Special Requests NONE    Culture NO GROWTH Performed at [facility]     Report Status 10/19/2015 FINAL   Lipase, blood     Status: None   Collection Time: 10/18/15  9:21 AM  Result Value Ref Range   Lipase 12 11 - 51 U/L  Glucose, capillary     Status: Abnormal   Collection Time: 10/18/15  1:24 PM  Result Value Ref Range   Glucose-Capillary 128 (H) 65 - 99 mg/dL  Glucose, capillary     Status: Abnormal   Collection Time: 10/18/15  3:58 PM  Result Value Ref Range   Glucose-Capillary 111 (H) 65 - 99 mg/dL  Type and screen     Status: None   Collection Time: 10/18/15  4:54 PM  Result Value Ref  Range   ABO/RH(D) O POS    Antibody Screen NEG    Sample Expiration 10/21/2015   Glucose, capillary     Status: Abnormal   Collection Time: 10/18/15  9:57 PM  Result Value Ref Range   Glucose-Capillary 149 (H) 65 - 99 mg/dL  Basic metabolic panel     Status: Abnormal   Collection Time: 10/19/15  3:56 AM  Result Value Ref Range   Sodium 140 135 - 145 mmol/L   Potassium 3.8 3.5 - 5.1 mmol/L   Chloride 115 (H) 101 - 111 mmol/L   CO2 21 (L) 22 - 32 mmol/L   Glucose, Bld 117 (H) 65 - 99 mg/dL   BUN 13 6 - 20 mg/dL   Creatinine, Ser 0.77 0.44 - 1.00 mg/dL   Calcium 9.1 8.9 - 10.3 mg/dL   GFR calc non Af Amer >60 >60 mL/min   GFR calc Af Amer >60 >60 mL/min    Comment: (NOTE) The eGFR has been calculated using the CKD EPI equation. This calculation has not been validated in all clinical situations. eGFR's persistently <60 mL/min signify possible Chronic Kidney Disease.    Anion gap 4 (L) 5 - 15  CBC     Status: Abnormal   Collection Time: 10/19/15  3:56 AM  Result Value Ref Range   WBC 1.0 (LL) 3.6 - 11.0 K/uL    Comment: CRITICAL VALUE NOTED.  VALUE IS CONSISTENT WITH PREVIOUSLY REPORTED AND CALLED VALUE.   RBC 2.77 (L) 3.80 - 5.20 MIL/uL   Hemoglobin 8.3 (L) 12.0 - 16.0 g/dL   HCT 24.7 (L) 35.0 - 47.0 %   MCV 89.0 80.0 - 100.0 fL   MCH 29.9 26.0 - 34.0 pg   MCHC 33.6 32.0 - 36.0 g/dL   RDW 15.9 (H) 11.5 - 14.5 %   Platelets 143 (L) 150 - 440 K/uL  MRSA PCR Screening     Status: None   Collection Time: 10/19/15  5:05 AM  Result Value Ref Range   MRSA by PCR NEGATIVE NEGATIVE    Comment:        The GeneXpert MRSA Assay (FDA approved for NASAL specimens only), is one component of a comprehensive MRSA colonization surveillance program. It is not intended to diagnose MRSA infection nor to guide or monitor treatment for MRSA infections.   Glucose, capillary     Status: Abnormal   Collection Time: 10/19/15  7:48 AM  Result Value Ref Range   Glucose-Capillary 132 (H) 65  -  99 mg/dL   Comment 1 Notify RN    Comment 2 Document in Chart   Glucose, capillary     Status: Abnormal   Collection Time: 10/19/15 11:47 AM  Result Value Ref Range   Glucose-Capillary 129 (H) 65 - 99 mg/dL   Comment 1 Notify RN    Comment 2 Document in Chart   Glucose, capillary     Status: Abnormal   Collection Time: 10/19/15  2:09 PM  Result Value Ref Range   Glucose-Capillary 198 (H) 65 - 99 mg/dL   Dg Thoracic Spine 2 View  10/19/2015  CLINICAL DATA:  Fever and confusion since yesterday, generalized back pain since this morning, stage IV colon cancer with liver metastases EXAM: THORACIC SPINE 2 VIEWS COMPARISON:  CT chest 10/07/2015 FINDINGS: Osseous demineralization. Twelve pairs of ribs. Scattered endplate spur formation at mid to lower thoracic spine. Vertebral body heights maintained without fracture or subluxation. No definite bone destruction. 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 No acute abnormalities. Electronically Signed   By: Lavonia Dana M.D.   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 Five non-rib-bearing lumbar vertebra. Disc space narrowing with endplate spur formation at L5-S1 as well as at lower thoracic spine. Vertebral body heights maintained without fracture or subluxation. IUD projects over pelvis. Aortic atherosclerosis. Few pelvic phleboliths. IMPRESSION: Osseous demineralization with degenerative disc disease changes at lower thoracic spine and at L5-S1. No acute bony abnormalities. Electronically Signed   By: Lavonia Dana M.D.   On: 10/19/2015 14:37   Ct Head Wo Contrast  10/18/2015  CLINICAL DATA:  Decreased mental status for several days,  history of stage IV liver carcinoma with recent chemotherapy EXAM: CT HEAD WITHOUT CONTRAST TECHNIQUE: Contiguous axial images were obtained from the base of the skull through the vertex without intravenous contrast. COMPARISON:  None. FINDINGS: Bony calvarium is intact. Mild atrophic changes are noted. Prior lacunar infarct is noted adjacent to the head of the caudate nucleus on the right inferiorly. No findings to suggest acute hemorrhage, acute infarction or space-occupying mass lesion are noted. IMPRESSION: Chronic atrophic and ischemic changes without acute abnormality. Electronically Signed   By: Inez Catalina M.D.   On: 10/18/2015 10:27   Dg Chest Port 1 View  10/18/2015  CLINICAL DATA:  Sepsis EXAM: PORTABLE CHEST 1 VIEW COMPARISON:  10/07/2015 FINDINGS: Cardiomediastinal silhouette is stable. No infiltrate or pulmonary edema. There is left subclavian Port-A-Cath with tip in right atrium. No pneumothorax. IMPRESSION: No active disease. Electronically Signed   By: Lahoma Crocker M.D.   On: 10/18/2015 09:54    ROS: Review of Systems  Constitutional: Positive for malaise/fatigue. Negative for fever, chills, weight loss and diaphoresis.  HENT: Negative.   Eyes: Negative.   Respiratory: Negative for cough, hemoptysis, sputum production, shortness of breath and wheezing.   Cardiovascular: Negative for chest pain, palpitations, orthopnea, claudication, leg swelling and PND.  Gastrointestinal: Negative for heartburn, nausea, vomiting, abdominal pain, diarrhea, constipation, blood in stool and melena.  Genitourinary:       Patient states having low back pain with urination that radiates down her legs  Musculoskeletal: Positive for back pain.  Skin: Negative.   Neurological: Positive for focal weakness and weakness. Negative for dizziness,  tingling and seizures.  Endo/Heme/Allergies: Does not bruise/bleed easily.  Psychiatric/Behavioral: Negative for depression. The patient is not nervous/anxious and  does not have insomnia.        Intermittent confusion    Vital Signs: Blood pressure 130/68, pulse 66, temperature 97.8 F (36.6 C), temperature source Oral, resp. rate 20, height 5' 4"  (1.626 m), weight 142 lb 3.2 oz (64.501 kg), SpO2 100 %.  Physical Exam: Physical Exam  Nursing note and vitals reviewed. Constitutional: She appears well-developed and well-nourished.  Eyes: Conjunctivae are normal. Pupils are equal, round, and reactive to light.  Neck: Normal range of motion. Neck supple.  Cardiovascular: Normal rate, regular rhythm and intact distal pulses.   Respiratory: Effort normal and breath sounds normal.  GI: Soft. Bowel sounds are normal.  Musculoskeletal: Normal range of motion.  Neurological: She is alert.  Oriented to person and place  Skin: Skin is warm and dry.     Assessment/Plan 1. Metastatic colon cancer. Patient is 11 days post cycle 1 FOLFIRI. Other than neutropenia and some mild diarrhea patient appears to have tolerated. 2. Neutropenia secondary to chemotherapy. Urinalysis, chest x-ray, and blood cultures thus far are all negative. Has been afebrile since admission. Patient remains on cefepime and vancomycin IV. We'll also continue with subcutaneous Neupogen/Granix 300 g daily until [place] is greater than 1000. Daily CBCs with differentials have been ordered. 3. Lower back pain. Patient with history of fall and also reports having increasing back pain with urination, we will await urine culture results. Lumbar and thoracic spine x-rays have been performed this afternoon. Physical therapy evaluation has been ordered.  We will continue to follow while patient is hospitalized. If patient should discharged she should keep currently scheduled appointment for Tuesday, July 11 with Dr. Mike Gip.  Dr. Grayland Ormond was available for consultation and review of plan of care for this patient.  Orville Govern Ladaja Yusupov 10/19/2015, 2:43 PM

## 2015-10-19 NOTE — Care Management Note (Signed)
Case Management Note  Patient Details  Name: Sandy [name] MRN: [id number] Date of Birth: 05/12/1945  Subjective/Objective:       70yo Ms Sandy [name] was admitted from home on 10/18/15 per weakness, frequent falls, and decreased alertness. Stage IV colon cancer receives chemo every Tuesday at [facility]. On [place]. Fallowed by [employer] for RN, PT, Aide, and SW. Resume [facility] services if discharges home. Seen by Sandy [name] on Friday. Case management will follow for discharge planning.              Action/Plan:   Expected Discharge Date:                  Expected Discharge Plan:     In-House Referral:     Discharge planning Services     Post Acute Care Choice:    Choice offered to:     DME Arranged:    DME Agency:     HH Arranged:    HH Agency:     Status of Service:     If discussed at [employer] of Stay Meetings, dates discussed:    Additional Comments:  Sandy [name] A, RN 10/19/2015, 8:24 AM

## 2015-10-19 NOTE — Progress Notes (Signed)
Initial Nutrition Assessment  DOCUMENTATION CODES:   Not applicable  INTERVENTION:   Cater to pt preferences on Regular diet order; will send plastic utensils only. Recommend Ensure Enlive po BID, each supplement provides 350 kcal and 20 grams of protein. Will switch to Glucerna if blood sugars elevated and po intake adequate.   NUTRITION DIAGNOSIS:   Inadequate oral intake related to poor appetite as evidenced by meal completion < 50%.  GOAL:   Patient will meet greater than or equal to 90% of their needs  MONITOR:   PO intake, Supplement acceptance, Labs, Weight trends, I & O's  REASON FOR ASSESSMENT:   Malnutrition Screening Tool    ASSESSMENT:   Pt admitted with neutropenic fevers secondary to chemotherapy. Pt with stage IV colon cancer.  Past Medical History  Diagnosis Date  . Hypertension   . Constipation   . Insomnia   . Diabetes mellitus without complication ([place])   . Cancer ([place])     liver mets; colon cancer  . 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 HOH (hard of hearing)   . Wears dentures     partial upper and lower    Diet Order:  Diet regular Room service appropriate?: Yes; Fluid consistency:: Thin   Pt ate bites of grits, eggs, and bacon this morning. Pt reports she's not that hungry right now.  Pt reports her appetite is good at home, usually eating 2-3 meals per day.  Pt reports liking vanilla ensure in the past. Per MD note, pt with poor po intake 2 days PTA. RD notes pt on Megace PTA per MD note as well.    Medications: Colace, SS novolog, Megace, MVI, NS at 125mL/hr Labs: reviewed, Glucose 117   Gastrointestinal Profile: Last BM:  no BM documented   Nutrition-Focused Physical Exam Findings: Nutrition-Focused physical exam completed. Findings are WDL for fat depletion, muscle depletion, and edema.    Weight Change: Pt reports weight loss but unsure how much. Pt reports her weight currently is 140lbs. Per [facility] weight  trends 4% weight loss in one month and 1.4% weight loss in one week possibly.   Skin:  Reviewed, no issues   Height:   Ht Readings from Last 1 Encounters:  10/18/15 5\' 4"  (1.626 m)    Weight:   Wt Readings from Last 1 Encounters:  10/19/15 142 lb 3.2 oz (64.501 kg)   Wt Readings from Last 10 Encounters:  10/19/15 142 lb 3.2 oz (64.501 kg)  10/14/15 144 lb 11.7 oz (65.65 kg)  10/08/15 149 lb 2.3 oz (67.65 kg)  10/07/15 145 lb (65.772 kg)  09/24/15 148 lb 9.4 oz (67.4 kg)  09/02/15 149 lb (67.586 kg)  08/30/15 143 lb (64.864 kg)  08/26/15 143 lb 11.8 oz (65.2 kg)  08/19/15 146 lb 15 oz (66.65 kg)  08/05/15 147 lb 7.8 oz (66.9 kg)   BMI:  Body mass index is 24.4 kg/(m^2).  Estimated Nutritional Needs:   Kcal:  1950-2275kcals  Protein:  72-85g protein  Fluid:  >/= 2L fluid  EDUCATION NEEDS:   Education needs no appropriate at this time  Dwyane Luo, RD, LDN Pager [phone] Weekend/On-Call Pager [phone]

## 2015-10-20 LAB — GLUCOSE, CAPILLARY
GLUCOSE-CAPILLARY: 128 mg/dL — AB (ref 65–99)
GLUCOSE-CAPILLARY: 90 mg/dL (ref 65–99)
GLUCOSE-CAPILLARY: 171 mg/dL — AB (ref 65–99)
Glucose-Capillary: 118 mg/dL — ABNORMAL HIGH (ref 65–99)

## 2015-10-20 LAB — CBC WITH DIFFERENTIAL/PLATELET
BLASTS: 0 %
Band Neutrophils: 10 %
Basophils Absolute: 0 10*3/uL (ref 0–0.1)
Basophils Relative: 0 %
EOS ABS: 0.1 10*3/uL (ref 0–0.7)
EOS PCT: 2 %
HEMATOCRIT: 28.7 % — AB (ref 35.0–47.0)
Hemoglobin: 9.8 g/dL — ABNORMAL LOW (ref 12.0–16.0)
LYMPHS ABS: 0.8 10*3/uL — AB (ref 1.0–3.6)
LYMPHS PCT: 30 %
MCH: 29.8 pg (ref 26.0–34.0)
MCHC: 34.3 g/dL (ref 32.0–36.0)
MCV: 86.9 fL (ref 80.0–100.0)
MONOS PCT: 9 %
Metamyelocytes Relative: 1 %
Monocytes Absolute: 0.2 10*3/uL (ref 0.2–0.9)
Myelocytes: 2 %
NEUTROS ABS: 1.6 10*3/uL (ref 1.4–6.5)
Neutrophils Relative %: 46 %
OTHER: 0 %
PLATELETS: 248 10*3/uL (ref 150–440)
Promyelocytes Absolute: 0 %
RBC: 3.3 MIL/uL — ABNORMAL LOW (ref 3.80–5.20)
RDW: 16.4 % — AB (ref 11.5–14.5)
WBC: 2.7 10*3/uL — ABNORMAL LOW (ref 3.6–11.0)
nRBC: 0 /100 WBC

## 2015-10-20 LAB — TROPONIN I
TROPONIN I: 0.04 ng/mL — AB (ref <0.03)
Troponin I: <0.03 ng/mL (ref <0.03)
Troponin I: 0.03 ng/mL (ref <0.03)

## 2015-10-20 LAB — TYPE AND SCREEN
ABO/RH(D): O POS
Antibody Screen: NEGATIVE

## 2015-10-20 LAB — VANCOMYCIN, TROUGH: Vancomycin Tr: 11 ug/mL — ABNORMAL LOW (ref 15–20)

## 2015-10-20 LAB — TSH: TSH: 0.845 u[IU]/mL (ref 0.350–4.500)

## 2015-10-20 MED ORDER — FILGRASTIM 300 MCG/ML IJ SOLN
300.0000 ug | Freq: Once | INTRAMUSCULAR | Status: DC
  Filled 2015-10-20: qty 1

## 2015-10-20 MED ORDER — OXYCODONE HCL ER 15 MG PO T12A: 15.0000 mg | EXTENDED_RELEASE_TABLET | Freq: Two times a day (BID) | ORAL | Status: DC

## 2015-10-20 MED ORDER — SODIUM CHLORIDE 0.9 % IV BOLUS (SEPSIS)
250.0000 mL | Freq: Once | INTRAVENOUS | Status: AC
  Administered 2015-10-20: 07:00:00 via INTRAVENOUS

## 2015-10-20 MED ORDER — DILTIAZEM HCL 60 MG PO TABS
60.0000 mg | ORAL_TABLET | Freq: Three times a day (TID) | ORAL | Status: DC
  Administered 2015-10-20 – 2015-10-22 (×7): 60 mg via ORAL
  Filled 2015-10-20 (×7): qty 1

## 2015-10-20 MED ORDER — DILTIAZEM HCL 60 MG PO TABS: 60.0000 mg | ORAL_TABLET | Freq: Three times a day (TID) | ORAL | Status: DC

## 2015-10-20 MED ORDER — DILTIAZEM HCL 25 MG/5ML IV SOLN
10.0000 mg | Freq: Once | INTRAVENOUS | Status: AC
  Administered 2015-10-20: 07:00:00 10 mg via INTRAVENOUS
  Filled 2015-10-20: qty 5

## 2015-10-20 MED ORDER — METOPROLOL TARTRATE 5 MG/5ML IV SOLN
5.0000 mg | Freq: Four times a day (QID) | INTRAVENOUS | Status: DC
Start: 2015-10-20 — End: 2015-10-22
  Administered 2015-10-20 – 2015-10-22 (×10): 5 mg via INTRAVENOUS
  Filled 2015-10-20 (×11): qty 5

## 2015-10-20 MED ORDER — DILTIAZEM HCL 30 MG PO TABS: 30.0000 mg | ORAL_TABLET | Freq: Three times a day (TID) | ORAL | Status: DC

## 2015-10-20 MED ORDER — SODIUM CHLORIDE 0.9 % IV SOLN
INTRAVENOUS | Status: DC
  Administered 2015-10-20 – 2015-10-21 (×4): via INTRAVENOUS

## 2015-10-20 MED ORDER — OXYCODONE HCL ER 10 MG PO T12A
10.0000 mg | EXTENDED_RELEASE_TABLET | Freq: Two times a day (BID) | ORAL | Status: DC
  Administered 2015-10-20 – 2015-10-22 (×4): 10 mg via ORAL
  Filled 2015-10-20 (×4): qty 1

## 2015-10-20 MED ORDER — TBO-FILGRASTIM 300 MCG/0.5ML ~~LOC~~ SOSY: 300.0000 ug | PREFILLED_SYRINGE | Freq: Once | SUBCUTANEOUS | Status: DC

## 2015-10-20 NOTE — Progress Notes (Signed)
[facility] NAME: Sandy [name]    MR#:  [id number]  DATE OF BIRTH:  04/22/1945  SUBJECTIVE:  CHIEF COMPLAINT:   Chief Complaint  Patient presents with  . 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 Found to have neutropenia. No clear source of infection. Patient is much alert and oriented currently.  had A fib today, so transferred to telemetry.  No fever. Have c/o back pain.  REVIEW OF SYSTEMS:  CONSTITUTIONAL: No fever, fatigue or weakness.  EYES: No blurred or double vision.  EARS, NOSE, AND THROAT: No tinnitus or ear pain.  RESPIRATORY: No cough, shortness of breath, wheezing or hemoptysis.  CARDIOVASCULAR: No chest pain, orthopnea, edema.  GASTROINTESTINAL: No nausea, vomiting, diarrhea or abdominal pain.  GENITOURINARY: No dysuria, hematuria.  ENDOCRINE: No polyuria, nocturia,  HEMATOLOGY: No anemia, easy bruising or bleeding SKIN: No rash or lesion. MUSCULOSKELETAL: No joint pain or arthritis.   NEUROLOGIC: No tingling, numbness, weakness.  PSYCHIATRY: No anxiety or depression.   ROS  DRUG ALLERGIES:  No Known Allergies  VITALS:  Blood pressure 115/60, pulse 73, temperature 98.2 F (36.8 C), temperature source Oral, resp. rate 19, height 5\' 4"  (1.626 m), weight 67.45 kg (148 lb 11.2 oz), SpO2 98 %.  PHYSICAL EXAMINATION:  GENERAL:  70 y.o.-year-old patient lying in the bed with no acute distress.  EYES: Pupils equal, round, reactive to light and accommodation. No scleral icterus. Extraocular muscles intact.  HEENT: Head atraumatic, normocephalic. Oropharynx and nasopharynx clear.  NECK:  Supple, no jugular venous distention. No thyroid enlargement, no tenderness.  LUNGS: Normal breath sounds bilaterally, no wheezing, rales,rhonchi or crepitation. No use of accessory muscles of respiration.  CARDIOVASCULAR: S1, S2 fast. No murmurs, rubs, or gallops.   ABDOMEN: Soft, nontender, nondistended. Bowel sounds present. No organomegaly or mass.  EXTREMITIES: No pedal edema, cyanosis, or clubbing.  NEUROLOGIC: Cranial nerves II through XII are intact. Muscle strength 4/5 in all extremities. Sensation intact. Gait not checked.  PSYCHIATRIC: The patient is alert and oriented x 3.  SKIN: No obvious rash, lesion, or ulcer.   Physical Exam LABORATORY PANEL:   CBC  Recent Labs Lab 10/20/15 0458  WBC 2.7*  HGB 9.8*  HCT 28.7*  PLT 248   ------------------------------------------------------------------------------------------------------------------  Chemistries   Recent Labs Lab 10/18/15 0921 10/19/15 0356  NA 137 140  K 4.2 3.8  CL 105 115*  CO2 23 21*  GLUCOSE 120* 117*  BUN 13 13  CREATININE 1.05* 0.77  CALCIUM 9.9 9.1  AST 19  --   ALT 12*  --   ALKPHOS 129*  --   BILITOT 1.1  --    ------------------------------------------------------------------------------------------------------------------  Cardiac Enzymes  Recent Labs Lab 10/20/15 0747 10/20/15 1105  TROPONINI 0.04* <0.03   ------------------------------------------------------------------------------------------------------------------  RADIOLOGY:  Dg Thoracic Spine 2 View  10/19/2015  CLINICAL DATA:  Fever and confusion since yesterday, generalized back pain since this morning, stage IV colon cancer with liver metastases EXAM: THORACIC SPINE 2 VIEWS COMPARISON:  CT chest 10/07/2015 FINDINGS: Osseous demineralization. Twelve pairs of ribs. Scattered endplate spur formation at mid to lower thoracic spine. Vertebral body heights maintained without fracture or subluxation. No definite bone destruction. 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 No acute abnormalities. Electronically Signed   By: Lavonia Dana M.D.   On: 10/19/2015 14:38   Dg Lumbar Spine 2-3 Views  10/19/2015  CLINICAL DATA:  Fever and confusion  since yesterday, generalized back pain since this morning, stage IV colon cancer with liver metastases EXAM: LUMBAR SPINE - 2-3 VIEW COMPARISON:  CT abdomen pelvis 10/04/2015 FINDINGS: Marked osseous demineralization. Five non-rib-bearing lumbar vertebra. Disc space narrowing with endplate spur formation at L5-S1 as well as at lower thoracic spine. Vertebral body heights maintained without fracture or subluxation. IUD projects over pelvis. Aortic atherosclerosis. Few pelvic phleboliths. IMPRESSION: Osseous demineralization with degenerative disc disease changes at lower thoracic spine and at L5-S1. No acute bony abnormalities. Electronically Signed   By: Lavonia Dana M.D.   On: 10/19/2015 14:37    ASSESSMENT AND PLAN:   Active Problems:   Neutropenic fever (HCC)  * Neutropenic fever   She is currently on cefepime and vancomycin IV.   Urinalysis, chest x-ray are negative. Blood culture is sent negative so far.   No clear source of infection.    * A fib with RVR   Unclear etiology   Transfer to tele.   Check Troponin, TSH   Cardio consult   Cardizem inj given.  * Diabetes mellitus type 2   Hold metformin and continue insulin sliding scale coverage.  * Metastatic colon cancer getting chemotherapy   Presented with the neutropenia, chemotherapy was last week.   Oncology consult.   Plan for chemo next week.  * Essential hypertension   Controlled, continue home medication.  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 Physical therapy evaluation.     All the records are reviewed and case discussed with Care Management/Social Workerr. Management plans discussed with the patient, family and they are in agreement.  CODE STATUS: Full code  Critical due to cardiac arrhythmia, and fast rate.   I spoke to her daughter in room.  TOTAL TIME TAKING CARE OF THIS PATIENT: 35 critical care minutes.   POSSIBLE D/C IN 1-2 DAYS, DEPENDING  ON CLINICAL CONDITION. I called patient's daughter and spoke to her on phone to discuss patient's condition and plan.   Sandy [name] M.D on 10/20/2015   Between 7am to 6pm - Pager - [phone]  After 6pm go to [email or web address] - password [email or web address] [facility]  Office  [phone]  CC: Primary care physician; [name], MD  Note: This dictation was prepared with Dragon dictation along with smaller phrase technology. Any transcriptional errors that result from this process are unintentional.

## 2015-10-20 NOTE — Progress Notes (Signed)
TBO-filgrastin  Order for TBO-filgrastin [facility]) 300 mcg SUB-Q daily, with instructions to DC when AND greater than 1000.  Patient rec'd 300 mcg dose on 8 July; [place] on 9 July = 1600.  Patient rec'd second dose on 9 July.  As [place] can be expected to remain above 1000, will stop the TBO-filgrastin at this time.  Thanks,

## 2015-10-20 NOTE — Progress Notes (Signed)
Vancomycin dose administered 10/20/2015 at 2235 (documented). Trough scheduled for 10/20/2015 at 2300, collected at 2249 (after dose was infusing). Results do not reflect trough level. Please credit lab. Will reschedule trough for next dose.   Melysa Schroyer A. Strawn, [place].D., BCPS Clinical Pharmacist 10/20/2015 2313

## 2015-10-20 NOTE — Consult Note (Signed)
Sandy [name]  CARDIOLOGY CONSULT NOTE  Patient ID: ARLISA VANNOSTRAND MRN: [id number] DOB/AGE: September 14, 1945 70 y.o.  Admit date: 10/18/2015 Referring Physician Dr. Anselm Jungling Primary Physician Dr. Kingsley Spittle Primary Cardiologist Dr. Nehemiah Massed Reason for Consultation afib  HPI: Patient is a 70 year old female with history of colon carcinoma with liver metastases. Tonsil dictation was requested for atrial fibrillation which developed after admission. Patient was evaluated by cardiology in 2016 in our office where she underwent a functional study which revealed no ischemia. She is a somewhat difficult historian but has no prior history by chart of atrial fibrillation. Patient was admitted after presenting to the emergency room with weakness and fatigue and was noted to be febrile with a temperature of 102.1. She has known stage IV colon cancer on chemotherapy. Her last chemotherapy was one week ago. She had a fever for one to 2 days prior to presentation. She had some weakness fatigue and poor by mouth intake. She was unable to stand after EMS was called and she had to be carried to her stretcher. In the emergency room she was noted to have electrocardiogram showing sinus rhythm at a rate of 83. Laboratories showed neutropenia and was felt to have had a neutropenic fever and was started on empiric broad-spectrum antibiotics and cultures were taken. The x-ray was unremarkable. Her blood pressure was controlled. After admission patient developed an episode of atrial fibrillation with rapid ventricular response. She was given IV Cardizem at 10 mg as well as 2 mg of IV Toprol. She has converted back to sinus rhythm. She is hemodynamically stable at present. Cardiac markers are negative at 0.04 thus far. Patient does not complain of chest pain  Review of Systems  Constitutional: Positive for fever, weight loss and malaise/fatigue.  HENT: Negative.   Eyes: Negative.    Respiratory: Negative.   Cardiovascular: Negative.   Gastrointestinal: Negative.   Genitourinary: Negative.   Musculoskeletal: Negative.   Skin: Negative.   Neurological: Positive for weakness.  Endo/Heme/Allergies: Negative.   Psychiatric/Behavioral: Negative.     Past Medical History  Diagnosis Date  . Hypertension   . Constipation   . Insomnia   . Diabetes mellitus without complication ([place])   . Cancer ([place])     liver mets; colon cancer  . 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 HOH (hard of hearing)   . Wears dentures     partial upper and lower    Family History  Problem Relation Age of Onset  . Cancer Sister     Social History   Social History  . Marital Status: Widowed    Spouse Name: N/A  . Number of Children: N/A  . Years of Education: N/A   Occupational History  . Not on file.   Social History Main Topics  . Smoking status: Former Smoker -- 0.25 packs/day for 20 years    Types: Cigarettes    Quit date: 09/06/2013  . Smokeless tobacco: Never Used  . Alcohol Use: No  . Drug Use: No  . Sexual Activity: No   Other Topics Concern  . Not on file   Social History Narrative    Past Surgical History  Procedure Laterality Date  . Colonoscopy with propofol N/A 09/07/2014    Procedure: COLONOSCOPY WITH PROPOFOL;  Surgeon: Lucilla Lame, MD;  Location: [facility] ENDOSCOPY;  Service: Endoscopy;  Laterality: N/A;  . Esophagogastroduodenoscopy N/A 09/07/2014    Procedure: ESOPHAGOGASTRODUODENOSCOPY (EGD);  Surgeon: Lucilla Lame, MD;  Location[facility]COPY;  Service: Endoscopy;  Laterality: N/A;  . Portacath placement Left 09/24/2014    Procedure: INSERTION PORT-A-CATH;  Surgeon: Robert Bellow, MD;  Location: [facility] ORS;  Service: General;  Laterality: Left;  . Liver biopsy    . Cardiac catheterization    . Flexible sigmoidoscopy N/A 09/02/2015    Procedure: FLEXIBLE SIGMOIDOSCOPY;  Surgeon: Lucilla Lame, MD;  Location: [facility];   Service: Endoscopy;  Laterality: N/A;  Diabetic - oral meds Pt has port-a-cath     Prescriptions prior to admission  Medication Sig Dispense Refill Last Dose  . amLODipine (NORVASC) 5 MG tablet Take 5 mg by mouth daily. Reported on 09/24/2015   10/16/15 at Unknown time  . aspirin EC 81 MG tablet Take 81 mg by mouth daily. Reported on 08/26/2015   10/16/15 at Unknown time  . atorvastatin (LIPITOR) 40 MG tablet Take 40 mg by mouth daily.   10/16/15 at Unknown time  . gabapentin (NEURONTIN) 100 MG capsule Take 2 capsules (200 mg total) by mouth at bedtime. 1 tab daily and 2 tabs at bedtime. 60 capsule 0 10/16/15 at Unknown time  . lidocaine-prilocaine (EMLA) cream Apply 1 application topically as needed. Apply to port 1 hour prior to chemotherapy appointment. Cover with plastic wrap. 30 g 1 10/16/15 at Unknown time  . lisinopril (PRINIVIL,ZESTRIL) 10 MG tablet Take 10 mg by mouth daily.   10/16/15 at Unknown time  . magnesium 30 MG tablet Take 30 mg by mouth daily.    10/16/15 at Unknown time  . megestrol (MEGACE) 400 MG/10ML suspension Take 5 mLs (200 mg total) by mouth daily. to stimulate appetite 240 mL 0 10/16/15 at Unknown time  . metFORMIN (GLUCOPHAGE) 1000 MG tablet Take 1,000 mg by mouth 2 (two) times daily.    10/16/15 at Unknown time  . metoprolol (LOPRESSOR) 100 MG tablet Take 100 mg by mouth.   10/16/15 at Unknown time  . Multiple Vitamin (MULTIVITAMIN WITH MINERALS) TABS tablet Take 1 tablet by mouth daily.   10/16/15 at Unknown time  . ondansetron (ZOFRAN) 4 MG tablet Take 1 tablet (4 mg total) by mouth every 8 (eight) hours as needed for nausea or vomiting. 30 tablet 2 10/16/15 at Unknown time  . oxyCODONE (OXY IR/ROXICODONE) 5 MG immediate release tablet Take 1 tablet (5 mg total) by mouth every 6 (six) hours as needed for severe pain. 40 tablet 0 10/16/15 at Unknown time  . potassium chloride (K-DUR,KLOR-CON) 10 MEQ tablet Take 1 tablet (10 mEq total) by mouth once. Take 1 pill twice a  day for 3 days then 1 pill a day. 30 tablet 0 10/16/15 at Unknown time  . sorbitol 70 % solution Take 30 mLs by mouth daily as needed.   10/16/15 at Unknown time    Physical Exam: Blood pressure 100/63, pulse 72, temperature 97.7 F (36.5 C), temperature source Oral, resp. rate 22, height 5\' 4"  (1.626 m), weight 67.45 kg (148 lb 11.2 oz), SpO2 96 %.   Wt Readings from Last 1 Encounters:  10/20/15 67.45 kg (148 lb 11.2 oz)     General appearance: cooperative Head: Normocephalic, without obvious abnormality, atraumatic Resp: diminished breath sounds bilaterally Cardio: regular rate and rhythm Extremities: extremities normal, atraumatic, no cyanosis or edema Neurologic: Mental status: alertness: lethargic  Labs:   Lab Results  Component Value Date   WBC 2.7* 10/20/2015   HGB 9.8* 10/20/2015   HCT 28.7* 10/20/2015   MCV 86.9  10/20/2015   PLT 248 10/20/2015    Recent Labs Lab 10/18/15 0921 10/19/15 0356  NA 137 140  K 4.2 3.8  CL 105 115*  CO2 23 21*  BUN 13 13  CREATININE 1.05* 0.77  CALCIUM 9.9 9.1  PROT 6.9  --   BILITOT 1.1  --   ALKPHOS 129*  --   ALT 12*  --   AST 19  --   GLUCOSE 120* 117*   Lab Results  Component Value Date   CKTOTAL 128 09/23/2011   CKMB 1.7 09/23/2011   TROPONINI 0.04* 10/20/2015      Radiology: No active cardiopulmonary disease on chest x-ray.  EKG: Sinus rhythm on admission. Patient developed transient atrial fibrillation with rapid ventricular response treated with IV metoprolol and IV Cardizem currently back in sinus rhythm with no ischemia.  ASSESSMENT AND PLAN:  Patient is a 70 year old female with metastatic colon carcinoma with metastatic 2 the liver who was admitted after several weeks of weakness fatigue anorexia and extreme weakness. She presented to the emergency room where she was noted to be severely neutropenic. Atrial fibrillation was transient and back in sinus rhythm. This is likely due to multi-factor etiology. Would  continue with Cardizem at 60 every 8. May need to reduce if she is hypotensive with this. Would not anticoagulate at present due to bleeding risk. Continue to aggressively treat her neutropenia as you're doing. We'll follow with you. Signed: [name] MD, [facility] 10/20/2015, 10:04 AM

## 2015-10-20 NOTE — Evaluation (Signed)
Discussed with Dr. Mike Gip- she would like to continue daily Granix injection until [place] is 5000. Order placed for injection of 353mcg SQ tomorrow morning. Dr. Mike Gip to resume care Monday.

## 2015-10-21 DIAGNOSIS — D6481 Anemia due to antineoplastic chemotherapy: Secondary | ICD-10-CM

## 2015-10-21 DIAGNOSIS — R509 Fever, unspecified: Secondary | ICD-10-CM

## 2015-10-21 LAB — CBC WITH DIFFERENTIAL/PLATELET
BAND NEUTROPHILS: 18 %
BASOS ABS: 0 10*3/uL (ref 0–0.1)
BASOS PCT: 0 %
Blasts: 0 %
EOS ABS: 0 10*3/uL (ref 0–0.7)
Eosinophils Relative: 0 %
HCT: 27.5 % — ABNORMAL LOW (ref 35.0–47.0)
Hemoglobin: 9.2 g/dL — ABNORMAL LOW (ref 12.0–16.0)
Lymphocytes Relative: 15 %
Lymphs Abs: 1.3 10*3/uL (ref 1.0–3.6)
MCH: 29.4 pg (ref 26.0–34.0)
MCHC: 33.2 g/dL (ref 32.0–36.0)
MCV: 88.4 fL (ref 80.0–100.0)
METAMYELOCYTES PCT: 4 %
MONO ABS: 0.6 10*3/uL (ref 0.2–0.9)
MYELOCYTES: 2 %
Monocytes Relative: 7 %
Neutro Abs: 6.5 10*3/uL (ref 1.4–6.5)
Neutrophils Relative %: 54 %
OTHER: 0 %
PLATELETS: 322 10*3/uL (ref 150–440)
PROMYELOCYTES ABS: 0 %
RBC: 3.11 MIL/uL — ABNORMAL LOW (ref 3.80–5.20)
RDW: 16.7 % — AB (ref 11.5–14.5)
WBC: 8.4 10*3/uL (ref 3.6–11.0)
nRBC: 1 /100 WBC — ABNORMAL HIGH

## 2015-10-21 LAB — BASIC METABOLIC PANEL
ANION GAP: 6 (ref 5–15)
BUN: 7 mg/dL (ref 6–20)
CALCIUM: 9.3 mg/dL (ref 8.9–10.3)
CO2: 21 mmol/L — ABNORMAL LOW (ref 22–32)
Chloride: 115 mmol/L — ABNORMAL HIGH (ref 101–111)
Creatinine, Ser: 0.66 mg/dL (ref 0.44–1.00)
GFR calc Af Amer: >60 mL/min (ref >60)
GLUCOSE: 100 mg/dL — AB (ref 65–99)
Potassium: 3.6 mmol/L (ref 3.5–5.1)
SODIUM: 142 mmol/L (ref 135–145)

## 2015-10-21 LAB — GLUCOSE, CAPILLARY
GLUCOSE-CAPILLARY: 240 mg/dL — AB (ref 65–99)
Glucose-Capillary: 95 mg/dL (ref 65–99)
Glucose-Capillary: 231 mg/dL — ABNORMAL HIGH (ref 65–99)
Glucose-Capillary: 195 mg/dL — ABNORMAL HIGH (ref 65–99)

## 2015-10-21 LAB — VANCOMYCIN, TROUGH: Vancomycin Tr: 11 ug/mL — ABNORMAL LOW (ref 15–20)

## 2015-10-21 MED ORDER — DILTIAZEM HCL 25 MG/5ML IV SOLN
10.0000 mg | Freq: Once | INTRAVENOUS | Status: AC
Start: 2015-10-21 — End: 2015-10-21
  Administered 2015-10-21: 10 mg via INTRAVENOUS
  Filled 2015-10-21: qty 5

## 2015-10-21 MED ORDER — DEXTROSE 5 % IV SOLN
2.0000 g | Freq: Three times a day (TID) | INTRAVENOUS | Status: DC
  Administered 2015-10-21 – 2015-10-22 (×3): 2 g via INTRAVENOUS
  Filled 2015-10-21 (×4): qty 2

## 2015-10-21 MED ORDER — VANCOMYCIN HCL IN DEXTROSE 750-5 MG/150ML-% IV SOLN
750.0000 mg | Freq: Two times a day (BID) | INTRAVENOUS | Status: DC
  Administered 2015-10-21 – 2015-10-22 (×2): 750 mg via INTRAVENOUS
  Filled 2015-10-21 (×3): qty 150

## 2015-10-21 NOTE — Progress Notes (Signed)
Pt. slept throughout the night with no c/o pain. She ran NS on tele, NS at 70 continues. Will continue to monitor pt.

## 2015-10-21 NOTE — Progress Notes (Signed)
Visit made. Patient seen sitting up in bed, alert and interactive, daughter [name] at bedside. Patient was moved from 1C to 2A due to new onset of A-fib, treated with Cardizem, seen by Cardiology. Her pain is currently being managed with oxycontin 10 mg Q 12 and oxycodone IR 5mg  q 6 hrs PRN. Appetite fair. PT evaluation pending. Will continue to follow through final disposition. Flo Shanks Rn, BSN, [employer] [facility] Liaison [phone] c

## 2015-10-21 NOTE — Progress Notes (Signed)
Pharmacy Antibiotic Note  Sandy [name] is a 70 y.o. female admitted on 10/18/2015 with febrile neutropenia.  Pharmacy has been consulted for vancomycin and cefepime dosing.   Plan: Will change to Cefepime 2g IV q8 hours due to improvement of renal function.  Vancomycin Trough level ordered for 7/10 @ 1730.   Height: 5\' 4"  (162.6 cm) Weight: 146 lb 1.6 oz (66.271 kg) IBW/kg (Calculated) : 54.7  Temp (24hrs), Avg:98.4 F (36.9 C), Min:98.2 F (36.8 C), Max:98.6 F (37 C)   Recent Labs Lab 10/18/15 0921 10/19/15 0356 10/20/15 0458 10/20/15 2249 10/21/15 0457  WBC 1.0* 1.0* 2.7*  --  8.4  CREATININE 1.05* 0.77  --   --  0.66  LATICACIDVEN 1.5  --   --   --   --   VANCOTROUGH  --   --   --  11*  --     Estimated Creatinine Clearance: 60.4 mL/min (by C-G formula based on Cr of 0.66).    No Known Allergies  Antimicrobials this admission: vancomycin 7/7 >>  cefepime 7/7 >>   Dose adjustments this admission:  Microbiology results: 7/7 BCx: Sent 7/7 UCx: Sent  7/7 MRSA PCR: Sent  Thank you for allowing pharmacy to be a part of this patient's care.  Larene Beach, PharmD, BCPS Clinical Pharmacist 10/21/2015 1:48 PM

## 2015-10-21 NOTE — Progress Notes (Signed)
[facility] NAME: Sandy [name]    MR#:  [id number]  DATE OF BIRTH:  1946/01/31  SUBJECTIVE:  CHIEF COMPLAINT:   Chief Complaint  Patient presents with  . 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 Found to have neutropenia. No clear source of infection. Patient is much alert and oriented currently.  had A fib, so transferred to telemetry.  No fever. Have c/o back pain. Back pain is better controlled. Again had a fib episode this morning. Required Inj cardizem,  REVIEW OF SYSTEMS:  CONSTITUTIONAL: No fever, fatigue or weakness.  EYES: No blurred or double vision.  EARS, NOSE, AND THROAT: No tinnitus or ear pain.  RESPIRATORY: No cough, shortness of breath, wheezing or hemoptysis.  CARDIOVASCULAR: No chest pain, orthopnea, edema.  GASTROINTESTINAL: No nausea, vomiting, diarrhea or abdominal pain.  GENITOURINARY: No dysuria, hematuria.  ENDOCRINE: No polyuria, nocturia,  HEMATOLOGY: No anemia, easy bruising or bleeding SKIN: No rash or lesion. MUSCULOSKELETAL: No joint pain or arthritis.   NEUROLOGIC: No tingling, numbness, weakness.  PSYCHIATRY: No anxiety or depression.   ROS  DRUG ALLERGIES:  No Known Allergies  VITALS:  Blood pressure 118/62, pulse 60, temperature 98.2 F (36.8 C), temperature source Oral, resp. rate 16, height 5\' 4"  (1.626 m), weight 66.271 kg (146 lb 1.6 oz), SpO2 100 %.  PHYSICAL EXAMINATION:  GENERAL:  70 y.o.-year-old patient lying in the bed with no acute distress.  EYES: Pupils equal, round, reactive to light and accommodation. No scleral icterus. Extraocular muscles intact.  HEENT: Head atraumatic, normocephalic. Oropharynx and nasopharynx clear.  NECK:  Supple, no jugular venous distention. No thyroid enlargement, no tenderness.  LUNGS: Normal breath sounds bilaterally, no wheezing, rales,rhonchi or crepitation. No use of  accessory muscles of respiration.  CARDIOVASCULAR: S1, S2 fast. No murmurs, rubs, or gallops.  ABDOMEN: Soft, nontender, nondistended. Bowel sounds present. No organomegaly or mass.  EXTREMITIES: No pedal edema, cyanosis, or clubbing.  NEUROLOGIC: Cranial nerves II through XII are intact. Muscle strength 4/5 in all extremities. Sensation intact. Gait not checked.  PSYCHIATRIC: The patient is alert and oriented x 3.  SKIN: No obvious rash, lesion, or ulcer.   Physical Exam LABORATORY PANEL:   CBC  Recent Labs Lab 10/21/15 0457  WBC 8.4  HGB 9.2*  HCT 27.5*  PLT 322   ------------------------------------------------------------------------------------------------------------------  Chemistries   Recent Labs Lab 10/18/15 0921  10/21/15 0457  NA 137  < > 142  K 4.2  < > 3.6  CL 105  < > 115*  CO2 23  < > 21*  GLUCOSE 120*  < > 100*  BUN 13  < > 7  CREATININE 1.05*  < > 0.66  CALCIUM 9.9  < > 9.3  AST 19  --   --   ALT 12*  --   --   ALKPHOS 129*  --   --   BILITOT 1.1  --   --   < > = values in this interval not displayed. ------------------------------------------------------------------------------------------------------------------  Cardiac Enzymes  Recent Labs Lab 10/20/15 1105 10/20/15 1455  TROPONINI <0.03 0.03*   ------------------------------------------------------------------------------------------------------------------  RADIOLOGY:  No results found.  ASSESSMENT AND PLAN:   Active Problems:   Neutropenic fever ([place])  * Neutropenic fever   She is currently on cefepime and vancomycin IV.   Urinalysis, chest x-ray are negative. Blood culture is sent negative so far.   No clear  source of infection.   We may treat for 1 week.    * A fib with RVR   Unclear etiology   Transfered to tele.   Checked Troponin, normal TSH   Cardio consult appreciated.   Cardizem inj given.   Switched to long acting cardizem.  * Diabetes mellitus type 2    Hold metformin and continue insulin sliding scale coverage.  * Metastatic colon cancer getting chemotherapy   Presented with the neutropenia, chemotherapy was last week.   Oncology consult. Leukocytes improved now.   * Essential hypertension   Controlled, continue home medication.  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 Physical therapy evaluation done suggest SNF.   Long acting pain meds working better.   All the records are reviewed and case discussed with Care Management/Social Workerr. Management plans discussed with the patient, family and they are in agreement.  CODE STATUS: Full code  Critical due to cardiac arrhythmia, and fast rate.   I spoke to her daughter in room.  TOTAL TIME TAKING CARE OF THIS PATIENT: 35 minutes.   POSSIBLE D/C IN 1-2 DAYS, DEPENDING ON CLINICAL CONDITION.   Vaughan Basta M.D on 10/21/2015   Between 7am to 6pm - Pager - [phone]  After 6pm go to [email or web address] - password [email or web address]  Office  [phone]  CC: Primary care physician; [name], MD  Note: This dictation was prepared with Dragon dictation along with smaller phrase technology. Any transcriptional errors that result from this process are unintentional.

## 2015-10-21 NOTE — Progress Notes (Signed)
Central tele notified of 46ml cardizem push, push given. Will continue to monitor pt.

## 2015-10-21 NOTE — Progress Notes (Addendum)
Pt. Heart rate went up to 149 unsustained, her scheduled Cardizem and lopressor was given. Will continue to monitor pt.

## 2015-10-21 NOTE — Progress Notes (Signed)
[facility] Hematology/Oncology Progress Note  Date of admission: 10/18/2015  Hospital day:  10/21/2015  Chief Complaint: Sandy [name] is a 70 y.o. female with stage IV colon cancer who was admitted with fever and neutropenia.  Subjective:  Feeling much better, but still weak.  Denies any chest pain or shortness of breath.   Social History: The patient is accompanied by her daughter today.  Allergies: No Known Allergies  Scheduled Medications: . aspirin EC  81 mg Oral Daily  . atorvastatin  40 mg Oral Daily  . ceFEPime (MAXIPIME) IV  2 g Intravenous Q12H  . diltiazem  60 mg Oral Q8H  . docusate sodium  100 mg Oral BID  . feeding supplement (ENSURE ENLIVE)  237 mL Oral BID BM  . gabapentin  200 mg Oral QHS  . heparin  5,000 Units Subcutaneous Q8H  . insulin aspart  0-9 Units Subcutaneous TID WC  . lisinopril  10 mg Oral Daily  . magnesium gluconate  500 mg Oral Daily  . megestrol  200 mg Oral Daily  . metoprolol  5 mg Intravenous Q6H  .  morphine injection  2 mg Intravenous Once  . multivitamin with minerals  1 tablet Oral Daily  . oxyCODONE  10 mg Oral Q12H  . vancomycin  750 mg Intravenous Q18H    Review of Systems: GENERAL: Fatigue. Fever, resolved.No sweats. PERFORMANCE STATUS (ECOG): 2. HEENT: Wears a hearing aide. No visual changes, runny nose, sore throat, mouth sores or tenderness. Lungs: No shortness of breath or cough. No hemoptysis. Cardiac: No chest pain, palpitations, orthopnea, or PND. GI: Eating "ok".No nausea, vomiting, diarrhea, melena or hematochezia. GU: No dysuria or hematuria. Musculoskeletal: Denies back pain. No joint pain. No muscle tenderness. Extremities: No pain or swelling. 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 No headache, numbness or weakness, or balance issues. Endocrine: Diabetes. No thyroid issues, hot flashes or night sweats. Psych:  No mood changes or anxiety. Pain: Takes oxycodone prn. Review of systems: All other systems reviewed and found to be negative.  Physical Exam: Blood pressure 138/86, pulse 84, temperature 98.5 F (36.9 C), temperature source Oral, resp. rate 18, height 5' 4"  (1.626 m), weight 146 lb 1.6 oz (66.271 kg), SpO2 97 %.  GENERAL: Elderly woman sitting comfortably on the medical unit in no acute distress. MENTAL STATUS: Alert and oriented to person, place and time. HEAD: Curly brown hair. Normocephalic, atraumatic, face symmetric, no Cushingoid features. EYES: Black rimmed glasses. Hazel/green eyes. Pupils equal round and reactive to light and accomodation. No conjunctivitis or scleral icterus. ENT: Oropharynx clear without lesion. Missing several teeth. Tongue normal. Mucous membranes moist.  RESPIRATORY: Clear to auscultation without rales, wheezes or rhonchi. CARDIOVASCULAR: Regular rate and rhythm without murmur, rub or gallop. ABDOMEN: Soft, non-tender with active bowel sounds. No guarding or rebound tenderness. No hepatosplenomegaly. SKIN: No rashes, ulcer or skin lesions.  EXTREMITIES: No edema, no skin discoloration or tenderness. No palpable cords. LYMPH NODES: Right low anterior cervical 2-3 cm soft mass (thyroid). No palpable supraclavicular, axillary or inguinal adenopathy  NEUROLOGICAL: Appropriate. PSYCH: Affect normal.   Results for orders placed or performed during the hospital encounter of 10/18/15 (from the past 48 hour(s))  Glucose, capillary     Status: Abnormal   Collection Time: 10/19/15  2:09 PM  Result Value Ref Range   Glucose-Capillary 198 (H) 65 - 99 mg/dL  Glucose, capillary     Status: Abnormal  Collection Time: 10/19/15  4:28 PM  Result Value Ref Range   Glucose-Capillary 194 (H) 65 - 99 mg/dL   Comment 1 Notify RN    Comment 2 Document in Chart   Glucose, capillary     Status: Abnormal   Collection Time: 10/19/15  9:03 PM  Result Value  Ref Range   Glucose-Capillary 201 (H) 65 - 99 mg/dL  CBC with Differential/Platelet     Status: Abnormal   Collection Time: 10/20/15  4:58 AM  Result Value Ref Range   WBC 2.7 (L) 3.6 - 11.0 K/uL   RBC 3.30 (L) 3.80 - 5.20 MIL/uL   Hemoglobin 9.8 (L) 12.0 - 16.0 g/dL   HCT 28.7 (L) 35.0 - 47.0 %   MCV 86.9 80.0 - 100.0 fL   MCH 29.8 26.0 - 34.0 pg   MCHC 34.3 32.0 - 36.0 g/dL   RDW 16.4 (H) 11.5 - 14.5 %   Platelets 248 150 - 440 K/uL   Neutrophils Relative % 46 %   Lymphocytes Relative 30 %   Monocytes Relative 9 %   Eosinophils Relative 2 %   Basophils Relative 0 %   Band Neutrophils 10 %   Metamyelocytes Relative 1 %   Myelocytes 2 %   Promyelocytes Absolute 0 %   Blasts 0 %   nRBC 0 0 /100 WBC   Other 0 %   Neutro Abs 1.6 1.4 - 6.5 K/uL   Lymphs Abs 0.8 (L) 1.0 - 3.6 K/uL   Monocytes Absolute 0.2 0.2 - 0.9 K/uL   Eosinophils Absolute 0.1 0 - 0.7 K/uL   Basophils Absolute 0.0 0 - 0.1 K/uL   RBC Morphology MIXED RBC POPULATION    WBC Morphology TOXIC GRANULATION     Comment: DOHLE BODIES   Smear Review LARGE PLATELETS PRESENT   TSH     Status: None   Collection Time: 10/20/15  7:47 AM  Result Value Ref Range   TSH 0.845 0.350 - 4.500 uIU/mL  Troponin I     Status: Abnormal   Collection Time: 10/20/15  7:47 AM  Result Value Ref Range   Troponin I 0.04 (HH) <0.03 ng/mL    Comment: CRITICAL RESULT CALLED TO, READ BACK BY AND VERIFIED WITH SUSAN PRESTO AT 0941 ON 10/20/15 BY [facility]   Glucose, capillary     Status: Abnormal   Collection Time: 10/20/15  8:00 AM  Result Value Ref Range   Glucose-Capillary 128 (H) 65 - 99 mg/dL  Troponin I     Status: None   Collection Time: 10/20/15 11:05 AM  Result Value Ref Range   Troponin I <0.03 <0.03 ng/mL  Glucose, capillary     Status: Abnormal   Collection Time: 10/20/15 11:40 AM  Result Value Ref Range   Glucose-Capillary 118 (H) 65 - 99 mg/dL   Comment 1 Notify RN    Comment 2 Document in Chart   Troponin I     Status:  Abnormal   Collection Time: 10/20/15  2:55 PM  Result Value Ref Range   Troponin I 0.03 (HH) <0.03 ng/mL    Comment: CRITICAL VALUE NOTED. VALUE IS CONSISTENT WITH PREVIOUSLY REPORTED/CALLED VALUE [facility]   Glucose, capillary     Status: None   Collection Time: 10/20/15  4:04 PM  Result Value Ref Range   Glucose-Capillary 90 65 - 99 mg/dL   Comment 1 Notify RN    Comment 2 Document in Chart   Glucose, capillary     Status: Abnormal  Collection Time: 10/20/15  8:45 PM  Result Value Ref Range   Glucose-Capillary 171 (H) 65 - 99 mg/dL  Vancomycin, trough     Status: Abnormal   Collection Time: 10/20/15 10:49 PM  Result Value Ref Range   Vancomycin Tr 11 (L) 15 - 20 ug/mL  CBC with Differential/Platelet     Status: Abnormal   Collection Time: 10/21/15  4:57 AM  Result Value Ref Range   WBC 8.4 3.6 - 11.0 K/uL   RBC 3.11 (L) 3.80 - 5.20 MIL/uL   Hemoglobin 9.2 (L) 12.0 - 16.0 g/dL   HCT 27.5 (L) 35.0 - 47.0 %   MCV 88.4 80.0 - 100.0 fL   MCH 29.4 26.0 - 34.0 pg   MCHC 33.2 32.0 - 36.0 g/dL   RDW 16.7 (H) 11.5 - 14.5 %   Platelets 322 150 - 440 K/uL   Neutrophils Relative % 54 %   Lymphocytes Relative 15 %   Monocytes Relative 7 %   Eosinophils Relative 0 %   Basophils Relative 0 %   Band Neutrophils 18 %   Metamyelocytes Relative 4 %   Myelocytes 2 %   Promyelocytes Absolute 0 %   Blasts 0 %   nRBC 1 (H) 0 /100 WBC   Other 0 %   Neutro Abs 6.5 1.4 - 6.5 K/uL   Lymphs Abs 1.3 1.0 - 3.6 K/uL   Monocytes Absolute 0.6 0.2 - 0.9 K/uL   Eosinophils Absolute 0.0 0 - 0.7 K/uL   Basophils Absolute 0.0 0 - 0.1 K/uL   RBC Morphology MIXED RBC POPULATION     Comment: POLYCHROMASIA PRESENT   WBC Morphology TOXIC GRANULATION     Comment: DOHLE BODIES   Smear Review LARGE PLATELETS PRESENT   Basic metabolic panel     Status: Abnormal   Collection Time: 10/21/15  4:57 AM  Result Value Ref Range   Sodium 142 135 - 145 mmol/L   Potassium 3.6 3.5 - 5.1 mmol/L   Chloride 115 (H) 101 -  111 mmol/L   CO2 21 (L) 22 - 32 mmol/L   Glucose, Bld 100 (H) 65 - 99 mg/dL   BUN 7 6 - 20 mg/dL   Creatinine, Ser 0.66 0.44 - 1.00 mg/dL   Calcium 9.3 8.9 - 10.3 mg/dL   GFR calc non Af Amer >60 >60 mL/min   GFR calc Af Amer >60 >60 mL/min    Comment: (NOTE) The eGFR has been calculated using the CKD EPI equation. This calculation has not been validated in all clinical situations. eGFR's persistently <60 mL/min signify possible Chronic Kidney Disease.    Anion gap 6 5 - 15  Glucose, capillary     Status: None   Collection Time: 10/21/15  7:48 AM  Result Value Ref Range   Glucose-Capillary 95 65 - 99 mg/dL   Comment 1 Notify RN   Glucose, capillary     Status: Abnormal   Collection Time: 10/21/15 11:31 AM  Result Value Ref Range   Glucose-Capillary 231 (H) 65 - 99 mg/dL   Comment 1 Notify RN    Dg Thoracic Spine 2 View  10/19/2015  CLINICAL DATA:  Fever and confusion since yesterday, generalized back pain since this morning, stage IV colon cancer with liver metastases EXAM: THORACIC SPINE 2 VIEWS COMPARISON:  CT chest 10/07/2015 FINDINGS: Osseous demineralization. Twelve pairs of ribs. Scattered endplate spur formation at mid to lower thoracic spine. Vertebral body heights maintained without fracture or subluxation. No definite bone  destruction. 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 No acute abnormalities. Electronically Signed   By: Lavonia Dana M.D.   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 Five non-rib-bearing lumbar vertebra. Disc space narrowing with endplate spur formation at L5-S1 as well as at lower thoracic spine. Vertebral body heights maintained without  fracture or subluxation. IUD projects over pelvis. Aortic atherosclerosis. Few pelvic phleboliths. IMPRESSION: Osseous demineralization with degenerative disc disease changes at lower thoracic spine and at L5-S1. No acute bony abnormalities. Electronically Signed   By: Lavonia Dana M.D.   On: 10/19/2015 14:37    Assessment:  Sandy [name] is a 70 y.o. female with metastatic colon cancer currently day #14 /sp cycle #1 FOLFIRI (dose reduced) admitted with fever and neutropenia.  At initial presentation, patient was confused.  She was pan-cultured and started on broad spectrum antibiotics (cefepime and vancomycin).  Cultures to-date are negative.  She is back to her baseline.  Course has been complicated by atrial fibrillation requiring diltiazem .  Patient currently in normal sinus rhythm.  Plan: 1.  Oncology:  Day 14 s/p cycle #1 FOLFIRI.  Counts have recovered with daily GSCF/Granix.  As ANC > 5,000, Granix discontinued.  Discussed with patient and her daughter the plan for postponing her next cycle of chemotherapy until next week.  Patient weak and fatigued.  Anticipate next cycle with Neulasta support.  2.  Hematology:  Counts recovered s/p Granix.  Chemotherapy induced anemia  Platelet count is normal.   3,  Cardiology:  Patient in NSR on Cardizem.  Anticipate discharge home soon.  Appreciate cardiology input.  No plan for anticoagulation.  4.  Infectious disease:  Patient afebrile.  Currently on vancomycin and Cefepime.  No positive cultures to date.  Possible discharge tomorrow to complete 7 day course of antibiotics.   5.  Disposition:  Anticipate discharge home in next day or so with plan for follow-up in clinic on 10/28/2015 for MD assess, labs and cycle #2 FOLFIRI with Neulasta support.  She will need to go home with home health care and physical therapy.     Lequita Asal, MD  10/21/2015, 12:53 PM

## 2015-10-21 NOTE — Progress Notes (Addendum)
Physical Therapy Evaluation Patient Details Name: Sandy [name] MRN: [id number] DOB: 12-12-1945 Today's Date: 10/21/2015   History of Present Illness  Pt admitted with dx of neutropenic fever and presents with weakness and fatigue.  Clinical Impression  Pt is a pleasant 70 y/o female with PMH of stage IV colon cancer, a-fib, DM, and HTN. PT attempted to get history but pt was a poor historian. Pt requires no assistance for bed mobility and minimal assistance for transfers and ambulation with verbal cues for safer ambulation. Pt demonstrated step-to gait pattern with B decreased step length. Pt requires verbal cueing to keep RW close to body for safety and tactile cueing on posterior hip to keep upright posture and prevent posterior lean. Pt limited by fatigue. Pt's HR monitored throughout session. Pt will benefit from skilled PT in order to increase strength, balance, and mobility. Pt is appropriate for SNF.    Follow Up Recommendations SNF    Equipment Recommendations       Recommendations for Other Services       Precautions / Restrictions Precautions Precautions: Fall Restrictions Weight Bearing Restrictions: No      Mobility  Bed Mobility Overal bed mobility: Independent             General bed mobility comments: Pt was able to go from supine to sit independently with B UE support but required verbal cueing to scoot to EOB.  Transfers Overall transfer level: Modified independent Equipment used: Rolling walker (2 wheeled)             General transfer comment: Pt required minimal assistance to go from sit to stand. Pt required moderate assistance to go from stand to sit due to fatigue after ambulation.  Ambulation/Gait Ambulation/Gait assistance: Min assist Ambulation Distance (Feet): 10 Feet Assistive device: Rolling walker (2 wheeled) Gait Pattern/deviations: Step-to pattern;Shuffle;Leaning posteriorly;Decreased step length - right;Decreased step length -  left   Gait velocity interpretation: Below normal speed for age/gender General Gait Details: Pt demonstrates a step-to gait pattern taking small steps. Pt requires verbal cueing to take bigger steps and keep RW closer to body for safety. Pt also requires tactile cueing on posterior hip to maintin upright posture and prevent posterior lean.   Stairs            Wheelchair Mobility    Modified Rankin (Stroke Patients Only)       Balance Overall balance assessment: Needs assistance Sitting-balance support: No upper extremity supported;Feet supported Sitting balance-Leahy Scale: Fair Sitting balance - Comments: Pt was able to balance in sitting position but demonstrated poor posture with trunk flexed.    Standing balance support: Bilateral upper extremity supported Standing balance-Leahy Scale: Poor Standing balance comment: Pt was unsteady upon standing but was able to steady herself using RW. Pt relies on B UE support for balance.                              Pertinent Vitals/Pain Pain Assessment: No/denies pain    Home Living Family/patient expects to be discharged to:: Private residence Living Arrangements: Children Available Help at Discharge: Family Type of Home: House         Home Equipment: Gilford Rile - 2 wheels Additional Comments: Pt is a poor historian with home situation. Pt lives at home with daughter per RN.    Prior Function Level of Independence: Needs assistance         Comments: Pt is a poor  historian     Hand Dominance        Extremity/Trunk Assessment               Lower Extremity Assessment: Generalized weakness-- Poor command following, did not test UEs.          Communication   Communication: HOH  Cognition Arousal/Alertness: Awake/alert Behavior During Therapy: WFL for tasks assessed/performed Overall Cognitive Status: No family/caregiver present to determine baseline cognitive functioning                       General Comments      Exercises        Assessment/Plan    PT Assessment Patient needs continued PT services  PT Diagnosis Difficulty walking;Generalized weakness   PT Problem List    PT Treatment Interventions DME instruction;Gait training;Functional mobility training;Therapeutic activities;Therapeutic exercise;Balance training;Patient/family education   PT Goals (Current goals can be found in the Care Plan section) Acute Rehab PT Goals Patient Stated Goal: To return home PT Goal Formulation: With patient Time For Goal Achievement: 11/04/15 Potential to Achieve Goals: Fair    Frequency Min 2X/week   Barriers to discharge Decreased caregiver support (unknown how often daughter is at home to assist)      Co-evaluation               End of Session Equipment Utilized During Treatment: Gait belt Activity Tolerance: Patient limited by fatigue Patient left: in chair;with chair alarm set;with call bell/phone within reach Nurse Communication: Mobility status         Time: [id number] PT Time Calculation (min) (ACUTE ONLY): 18 min   Charges:   PT Evaluation $PT Eval Moderate Complexity: 1 Procedure     PT G Codes:        Nov 07, 2015, 5:28 PM  Georgina Pillion, SPT [phone]  This entire session was performed under direct supervision and direction of a licensed therapist/therapist assistant . I have personally read, edited and approve of the note as written. Kerman Passey, PT, DPT

## 2015-10-21 NOTE — Progress Notes (Signed)
Pharmacy Antibiotic Note  Sandy [name] is a 70 y.o. female admitted on 10/18/2015 with febrile neutropenia.  Pharmacy has been consulted for vancomycin and cefepime dosing.   Plan: Will change to Cefepime 2g IV q8 hours due to improvement of renal function.  Vancomycin Trough level ordered for 7/10 @ 1730.   7/10 :  VT @ 17:30 = 11 mcg/mL Will adjust dose to Vancomycin 750 mg IV Q12H to start 7/10 @ 19:00.   Will draw next trough on 7/11 @ 18:30.   Height: 5\' 4"  (162.6 cm) Weight: 146 lb 1.6 oz (66.271 kg) IBW/kg (Calculated) : 54.7  Temp (24hrs), Avg:98.4 F (36.9 C), Min:98.2 F (36.8 C), Max:98.6 F (37 C)   Recent Labs Lab 10/18/15 0921 10/19/15 0356 10/20/15 0458 10/20/15 2249 10/21/15 0457 10/21/15 1740  WBC 1.0* 1.0* 2.7*  --  8.4  --   CREATININE 1.05* 0.77  --   --  0.66  --   LATICACIDVEN 1.5  --   --   --   --   --   VANCOTROUGH  --   --   --  11*  --  11*    Estimated Creatinine Clearance: 60.4 mL/min (by C-G formula based on Cr of 0.66).    No Known Allergies  Antimicrobials this admission: vancomycin 7/7 >>  cefepime 7/7 >>   Dose adjustments this admission:  Microbiology results: 7/7 BCx: Sent 7/7 UCx: Sent  7/7 MRSA PCR: Sent  Thank you for allowing pharmacy to be a part of this patient's care.  Orene Desanctis, PharmD Clinical Pharmacist 10/21/2015 6:22 PM

## 2015-10-21 NOTE — Progress Notes (Signed)
Pt. Heart rate increased to 159 unsustained, she converted to A-fib, had a missed beat, prime paged.

## 2015-10-21 NOTE — Progress Notes (Signed)
Dr. Estanislado Pandy ordered 10mg  IV push cardizem for increased HR.

## 2015-10-21 NOTE — Care Management Important Message (Signed)
Important Message  Patient Details  Name: Sandy [name] MRN: [id number] Date of Birth: [birth year]   Medicare Important Message Given:  Yes    Katrina Stack, RN 10/21/2015, 1:25 PM

## 2015-10-22 ENCOUNTER — Inpatient Hospital Stay: Payer: No Typology Code available for payment source | Admitting: Hematology and Oncology

## 2015-10-22 ENCOUNTER — Inpatient Hospital Stay: Payer: No Typology Code available for payment source

## 2015-10-22 LAB — CBC WITH DIFFERENTIAL/PLATELET
BASOS ABS: 0.1 10*3/uL (ref 0–0.1)
BASOS PCT: 1 %
EOS ABS: 0 10*3/uL (ref 0–0.7)
Eosinophils Relative: 0 %
HCT: 27.3 % — ABNORMAL LOW (ref 35.0–47.0)
Hemoglobin: 9.1 g/dL — ABNORMAL LOW (ref 12.0–16.0)
LYMPHS PCT: 8 %
Lymphs Abs: 1.1 10*3/uL (ref 1.0–3.6)
MCH: 29.3 pg (ref 26.0–34.0)
MCHC: 33.5 g/dL (ref 32.0–36.0)
MCV: 87.5 fL (ref 80.0–100.0)
MONO ABS: 0.3 10*3/uL (ref 0.2–0.9)
Monocytes Relative: 2 %
NEUTROS ABS: 12.2 10*3/uL — AB (ref 1.4–6.5)
Neutrophils Relative %: 89 %
PLATELETS: 350 10*3/uL (ref 150–440)
RBC: 3.12 MIL/uL — ABNORMAL LOW (ref 3.80–5.20)
RDW: 17.2 % — AB (ref 11.5–14.5)
Smear Review: ADEQUATE
WBC: 13.7 10*3/uL — ABNORMAL HIGH (ref 3.6–11.0)

## 2015-10-22 LAB — GLUCOSE, CAPILLARY: GLUCOSE-CAPILLARY: 126 mg/dL — AB (ref 65–99)

## 2015-10-22 MED ORDER — TRAZODONE HCL 50 MG PO TABS: 25.0000 mg | ORAL_TABLET | Freq: Every evening | ORAL | Status: DC | PRN

## 2015-10-22 MED ORDER — ENSURE ENLIVE PO LIQD: 237.0000 mL | Freq: Two times a day (BID) | ORAL | Status: DC

## 2015-10-22 MED ORDER — DOCUSATE SODIUM 100 MG PO CAPS: 100.0000 mg | ORAL_CAPSULE | Freq: Two times a day (BID) | ORAL | Status: DC

## 2015-10-22 MED ORDER — CEFUROXIME AXETIL 250 MG PO TABS: 250.0000 mg | ORAL_TABLET | Freq: Two times a day (BID) | ORAL | Status: DC

## 2015-10-22 MED ORDER — OXYCODONE HCL ER 10 MG PO T12A: 10.0000 mg | EXTENDED_RELEASE_TABLET | Freq: Two times a day (BID) | ORAL | Status: DC

## 2015-10-22 MED ORDER — DILTIAZEM HCL 60 MG PO TABS: 60.0000 mg | ORAL_TABLET | Freq: Three times a day (TID) | ORAL | Status: DC

## 2015-10-22 MED ORDER — OXYCODONE HCL 5 MG PO TABS: 5.0000 mg | ORAL_TABLET | Freq: Four times a day (QID) | ORAL | Status: DC | PRN

## 2015-10-22 NOTE — NC FL2 (Signed)
[place] LEVEL OF CARE SCREENING TOOL     IDENTIFICATION  Patient Name: Sandy [name] Birthdate: 08-02-1945 Sex: female Admission Date (Current Location): 10/18/2015  [place] and [place] Number:  Engineering geologist and Address:  [facility], [address], [place], [place] [address]      Provider Number: [id number]  Attending Physician Name and Address:  Vaughan Basta, MD  Relative Name and Phone Number:       Current Level of Care: Hospital Recommended Level of Care: [place] Prior Approval Number:    Date Approved/Denied:   PASRR Number:  ([id number])  Discharge Plan: SNF    Current Diagnoses: Patient Active Problem List   Diagnosis Date Noted  . Neutropenic fever ([place]) 10/18/2015  . Thyroid nodule 09/24/2015  . Personal history of colon cancer   . Thyroid nodule 08/30/2015  . Malignant neoplasm of sigmoid colon ([place]) 08/27/2015  . Hematochezia 06/10/2015  . Neuropathy ([place]) 04/29/2015  . Muscle pain, lumbar 02/12/2015  . Hypomagnesemia 01/15/2015  . Hypokalemia 10/23/2014  . Hyponatremia 10/21/2014  . Colon cancer ([place]) 09/21/2014  . Metastases to the liver ([place]) 09/21/2014  . Colon cancer metastasized to liver ([place]) 09/18/2014  . Cancer related pain 09/18/2014  . Dysuria 09/18/2014  . Anemia 09/18/2014  . Anorexia 09/18/2014  . Weight loss, unintentional 09/18/2014  . Benign essential HTN 09/03/2014  . Type 2 diabetes mellitus ([place]) 09/03/2014  . Personal history of nicotine dependence 09/03/2014  . Combined fat and carbohydrate induced hyperlipemia 09/03/2014  . Chest pain 08/28/2014    Orientation RESPIRATION BLADDER Height & Weight     Self, Situation, Place  Normal Incontinent Weight: 143 lb 3.2 oz (64.955 kg) Height:  5\' 4"  (162.6 cm)  BEHAVIORAL SYMPTOMS/MOOD NEUROLOGICAL BOWEL NUTRITION STATUS   (None)  (None) Incontinent Diet (Regular)  AMBULATORY STATUS COMMUNICATION  OF NEEDS Skin   Limited Assist Verbally Normal                       Personal Care Assistance Level of Assistance  Bathing, Feeding, Dressing Bathing Assistance: Limited assistance Feeding assistance: Independent Dressing Assistance: Limited assistance     Functional Limitations Info  Sight, Hearing, Speech Sight Info: Adequate Hearing Info: Adequate Speech Info: Adequate    SPECIAL CARE FACTORS FREQUENCY  PT (By licensed PT), OT (By licensed OT)     PT Frequency:  (5) OT Frequency:  (5)            Contractures      Additional Factors Info  Code Status, Allergies, Insulin Sliding Scale Code Status Info:  (Full Code) Allergies Info:  (No Known Allergies)   Insulin Sliding Scale Info:  (insulin aspart (novoLOG) injection 0-9 Units 0-9 Units, Subcutaneous, 3 times daily with meals  )       Current Medications (10/22/2015):  This is the current hospital active medication list Current Facility-Administered Medications  Medication Dose Route Frequency Provider Last Rate Last Dose  . 0.9 %  sodium chloride infusion   Intravenous Continuous Saundra Shelling, MD 70 mL/hr at 10/21/15 2108    . acetaminophen (TYLENOL) tablet 650 mg  650 mg Oral Q6H PRN Epifanio Lesches, MD       Or  . acetaminophen (TYLENOL) suppository 650 mg  650 mg Rectal Q6H PRN Epifanio Lesches, MD      . aspirin EC tablet 81 mg  81 mg Oral Daily Epifanio Lesches, MD   81 mg at  10/22/15 0913  . atorvastatin (LIPITOR) tablet 40 mg  40 mg Oral Daily Epifanio Lesches, MD   40 mg at 10/22/15 0912  . bisacodyl (DULCOLAX) EC tablet 5 mg  5 mg Oral Daily PRN Epifanio Lesches, MD      . ceFEPIme (MAXIPIME) 2 g in dextrose 5 % 50 mL IVPB  2 g Intravenous Q8H Epifanio Lesches, MD   2 g at 10/22/15 1313  . diltiazem (CARDIZEM) tablet 60 mg  60 mg Oral Q8H [name], MD   60 mg at 10/22/15 1312  . docusate sodium (COLACE) capsule 100 mg  100 mg Oral BID Epifanio Lesches, MD   100 mg at  10/22/15 0913  . feeding supplement (ENSURE ENLIVE) (ENSURE ENLIVE) liquid 237 mL  237 mL Oral BID BM Vaughan Basta, MD   237 mL at 10/22/15 1400  . gabapentin (NEURONTIN) capsule 200 mg  200 mg Oral QHS Epifanio Lesches, MD   200 mg at 10/21/15 2107  . heparin injection 5,000 Units  5,000 Units Subcutaneous Q8H Epifanio Lesches, MD   5,000 Units at 10/22/15 1313  . HYDROcodone-acetaminophen (NORCO/VICODIN) 5-325 MG per tablet 1-2 tablet  1-2 tablet Oral Q4H PRN Epifanio Lesches, MD   1 tablet at 10/19/15 1709  . insulin aspart (novoLOG) injection 0-9 Units  0-9 Units Subcutaneous TID WC Epifanio Lesches, MD   1 Units at 10/22/15 1202  . lisinopril (PRINIVIL,ZESTRIL) tablet 10 mg  10 mg Oral Daily Epifanio Lesches, MD   10 mg at 10/22/15 0912  . magnesium gluconate (MAGONATE) tablet 500 mg  500 mg Oral Daily Epifanio Lesches, MD   500 mg at 10/22/15 0913  . megestrol (MEGACE) 400 MG/10ML suspension 200 mg  200 mg Oral Daily Epifanio Lesches, MD   200 mg at 10/22/15 0913  . metoprolol (LOPRESSOR) injection 5 mg  5 mg Intravenous Q6H Saundra Shelling, MD   5 mg at 10/22/15 1202  . morphine 2 MG/ML injection 2 mg  2 mg Intravenous Once Vaughan Basta, MD   2 mg at 10/19/15 1710  . multivitamin with minerals tablet 1 tablet  1 tablet Oral Daily Epifanio Lesches, MD   1 tablet at 10/22/15 0913  . ondansetron (ZOFRAN) tablet 4 mg  4 mg Oral Q6H PRN Epifanio Lesches, MD       Or  . ondansetron (ZOFRAN) injection 4 mg  4 mg Intravenous Q6H PRN Epifanio Lesches, MD      . ondansetron (ZOFRAN) tablet 4 mg  4 mg Oral Q8H PRN Epifanio Lesches, MD      . oxyCODONE (Oxy IR/ROXICODONE) immediate release tablet 5 mg  5 mg Oral Q6H PRN Epifanio Lesches, MD   5 mg at 10/20/15 0408  . oxyCODONE (OXYCONTIN) 12 hr tablet 10 mg  10 mg Oral Q12H Vaughan Basta, MD   10 mg at 10/22/15 0912  . traZODone (DESYREL) tablet 25 mg  25 mg Oral QHS PRN Epifanio Lesches, MD        Facility-Administered Medications Ordered in Other Encounters  Medication Dose Route Frequency Provider Last Rate Last Dose  . acetaminophen (TYLENOL) tablet 650 mg  650 mg Oral Once Lequita Asal, MD   650 mg at 10/19/14 [id number]  . sodium chloride 0.9 % injection 10 mL  10 mL Intracatheter PRN Lequita Asal, MD   10 mL at 10/11/14 1431  . sodium chloride 0.9 % injection 10 mL  10 mL Intracatheter PRN Lequita Asal, MD   10 mL at  02/28/15 1529     Discharge Medications: Please see discharge summary for a list of discharge medications.  Relevant Imaging Results:  Relevant Lab Results:   Additional Information  (SSN [id number])  Lorenso Quarry [place]ins, LCSW

## 2015-10-22 NOTE — Clinical Social Work Note (Signed)
Clinical Social Work Assessment  Patient Details  Name: Sandy [name] MRN: [id number] Date of Birth: 10-21-1945  Date of referral:  10/22/15               Reason for consult:  Discharge Planning                Permission sought to share information with:  Family Supports Permission granted to share information::  Yes, Verbal Permission Granted  Name::        Agency::     Relationship::   [name]- Daughter)  Sport and exercise psychologist Information:     Housing/Transportation Living arrangements for the past 2 months:  Apartment Source of Information:  Patient, Adult Children Patient Interpreter Needed:  None Criminal Activity/Legal Involvement Pertinent to Current Situation/Hospitalization:  No - Comment as needed Significant Relationships:  Adult Children, Other Family Members Lives with:  Adult Children [name] ) Do you feel safe going back to the place where you live?  Yes Need for family participation in patient care:  Yes (Comment) [name])  Care giving concerns:  Patient's family feels that she'll benefit from SNF placement.    Social Worker assessment / plan:  CSW met with patient at bedside. Patient was oriented X2. Granted CSW verbal permission to speak to her daughter about discharge plans. Patient was somewhat confused while speaking to CSW. CSW called patient's daughter. She reports that she feels that patient will benefit from SNF. Reports that she's interested in patient going to [facility] at discharge. Granted CSW verbal permission to send SNF referrals to SNFs in [place]. Reported that she's willing to transport patient to chemo bi-weekly. Reported that patient used to have [facility] before she was admitted int [facility]. Stated that she feels that rehab will be better for patient. FL2/ PASRR completed and faxed to SNFs in [facility]. FL2 placed in MD's basket for signature. Awaiting bed offers. CSW contacted Levi Aland- Admissions Director at [facility], left a message and  requested she review patient's referral. CSW was informed by MD that patient is medically stable for discharge today. CSW will continue to follow and assist.   Employment status:  Retired Forensic scientist:  Medicare PT Recommendations:  [place] / Referral to community resources:  [place]  Patient/Family's Response to care:  Patient's daughter is in agreement for patient to discharge to SNF.   Patient/Family's Understanding of and Emotional Response to Diagnosis, Current Treatment, and Prognosis:  Patient's daughter has an understanding of patient's diagnosis, current treatment and prognosis.   Emotional Assessment Appearance:  Appears stated age Attitude/Demeanor/Rapport:   (None) Affect (typically observed):  Calm, Pleasant Orientation:  Oriented to Self, Oriented to Place Alcohol / Substance use:  Not Applicable Psych involvement (Current and /or in the community):  No (Comment)  Discharge Needs  Concerns to be addressed:  Discharge Planning Concerns Readmission within the last 30 days:  No Current discharge risk:  Chronically ill Barriers to Discharge:  Continued Medical Work up   [employer], LCSW 10/22/2015, 1:42 PM

## 2015-10-22 NOTE — Progress Notes (Signed)
CSW presented bed offers to patient's daughter. Accepted bed at [facility]. CSW informed Tammy- Admissions Director of [facility]. CSW informed MD of above. CSW was informed that patient will be medically ready to discharge to [facility]. Patient and her family are in a agreement with plan. CSW called Tammy- Admissions Coordinator at SNF to confirm that patient's bed is ready. Provided patient's room number 509 and number to call for report Juliann Pulse [phone] . All discharge information faxed to [facility] via [facility]. RN will call report and patient will discharge to [facility] via patient's daughter [name] at [place], MSW, [place], [place] Social Worker [phone]

## 2015-10-22 NOTE — Progress Notes (Signed)
Physical Therapy Treatment Patient Details Name: Sandy [name] MRN: [id number] DOB: 06-16-1945 Today's Date: 10/22/2015    History of Present Illness Pt admitted with dx of neutropenic fever and presents with weakness and fatigue.    PT Comments    Pt in bed initially agrees to therapy, then declines once equipment obtained, then agrees again once encouraged "It's just how I am".  Pt to edge of bed with min a x 1.  Attempted without assist using rail but she was unable to get upper body off of the bed.  Pt grabbed writers neck and educated not to grab staff around neck "I wasn't going to pull on you".  Sitting with min guard for safety.  She stood with min guard but with poor safety and unable to gain balance using her legs on the bed.  She was able to march in place with min a x 1 then sat quickly on bed.  "I just fell backwards"   On second attempt she was able to transfer to chair at bedside 3' away but with poor gait quality and walker position.  She had almost turned fully to chair when she sat and needed to be guided to a safe sitting position.  Participated in exercises as described below in chair.  HR monitored throughout session.  77 at rest, 84 with mobility. Pt is a high fall risk and unsafe to transfer/ambulate without assist.   Follow Up Recommendations  SNF     Equipment Recommendations       Recommendations for Other Services       Precautions / Restrictions Precautions Precautions: Fall Restrictions Weight Bearing Restrictions: No    Mobility  Bed Mobility Overal bed mobility: Needs Assistance Bed Mobility: Supine to Sit     Supine to sit: Min assist (Pt using rail but unable to pull herself up to sitting, )        Transfers Overall transfer level: Needs assistance Equipment used: Rolling walker (2 wheeled) Transfers: Sit to/from Stand Sit to Stand: Min guard         General transfer comment: poor safety, sits without warning before fully turned  to chair  Ambulation/Gait Ambulation/Gait assistance: Min assist Ambulation Distance (Feet): 3 Feet Assistive device: Rolling walker (2 wheeled) Gait Pattern/deviations: Step-to pattern;Decreased step length - right;Decreased step length - left;Narrow base of support   Gait velocity interpretation: Below normal speed for age/gender General Gait Details: poor gait quality and safety   Stairs            Wheelchair Mobility    Modified Rankin (Stroke Patients Only)       Balance Overall balance assessment: Needs assistance Sitting-balance support: Feet supported Sitting balance-Leahy Scale: Fair     Standing balance support: Bilateral upper extremity supported Standing balance-Leahy Scale: Poor Standing balance comment: unsteady in standing, leaning on bed for support                    Cognition Arousal/Alertness: Awake/alert Behavior During Therapy: WFL for tasks assessed/performed Overall Cognitive Status: No family/caregiver present to determine baseline cognitive functioning                      Exercises General Exercises - Lower Extremity Ankle Circles/Pumps: AROM;Both;20 reps;Seated Long Arc Quad: AROM;Both;20 reps;Seated Hip Flexion/Marching: AROM;Both;20 reps;Seated    General Comments        Pertinent Vitals/Pain Pain Assessment: No/denies pain    Home Living  Prior Function            PT Goals (current goals can now be found in the care plan section)      Frequency  Min 2X/week    PT Plan Current plan remains appropriate    Co-evaluation             End of Session Equipment Utilized During Treatment: Gait belt Activity Tolerance: Patient limited by fatigue Patient left: in chair;with chair alarm set;with call bell/phone within reach     Time: 1330-1354 PT Time Calculation (min) (ACUTE ONLY): 24 min  Charges:  $Therapeutic Exercise: 8-22 mins $Therapeutic Activity: 8-22  mins                    G Codes:      Chesley Noon, PTA 10/22/2015, 2:11 PM

## 2015-10-22 NOTE — Progress Notes (Signed)
Report called to [facility] / iv and tele removed/ daughter to transport to SNF.

## 2015-10-22 NOTE — Progress Notes (Signed)
Visit made. Patient seen sitting up in the chair, alert, appeared depressed. Physical therapy eval done on 7/10 recommended SNF for rehab. Plan is for patient to go to [facility]. Daughter [name] in during visit, confirmed plan for discharge to [facility]. [facility] team to be updated. Thank you. Flo Shanks RN, BSN, [facility], hospital Liaison

## 2015-10-22 NOTE — Progress Notes (Addendum)
Inpatient Diabetes Program Recommendations  AACE/ADA: New Consensus Statement on Inpatient Glycemic Control (2015)  Target Ranges:  Prepandial:   less than 140 mg/dL      [facility] postprandial:   less than 180 mg/dL (1-2 hours)      Critically ill patients:  140 - 180 mg/dL   Lab Results  Component Value Date   GLUCAP 240* 10/21/2015   HGBA1C 5.9 08/28/2014    Review of Glycemic Control  Results for DEVA, MALTEZ (MRN [id number]) as of 10/22/2015 10:31  Ref. Range 10/21/2015 07:48 10/21/2015 11:31 10/21/2015 16:14 10/21/2015 20:38  Glucose-Capillary Latest Ref Range: 65-99 mg/dL 95 231 (H) 195 (H) 240 (H)    Diabetes history: Type 2 Outpatient Diabetes medications: Metformin 1000mg  bid Current orders for Inpatient glycemic control: Novolog 0-9 units tid with meals  Inpatient Diabetes Program Recommendations:  Agree with current orders for blood sugar management- FBS 128mg /dl this am   Gentry Fitz, RN, [place], [place], CDE Diabetes Coordinator Inpatient Diabetes Program  [phone] (Team Pager) [phone] ([place]) 10/22/2015 10:31 AM

## 2015-10-22 NOTE — Discharge Summary (Signed)
[facility] NAME: Sandy [name]    MR#:  [id number]  DATE OF BIRTH:  1946-01-03  DATE OF ADMISSION:  10/18/2015 ADMITTING PHYSICIAN: Epifanio Lesches, MD  DATE OF DISCHARGE: 10/22/2015  PRIMARY CARE PHYSICIAN: [name], MD    ADMISSION DIAGNOSIS:  Neutropenic fever (Sandy [name]) [D70.9, R50.81] Sepsis, due to unspecified organism (Sandy [name]) [A41.9]  DISCHARGE DIAGNOSIS:  Active Problems:   Neutropenic fever (Sandy [name])   A fib.  SECONDARY DIAGNOSIS:   Past Medical History  Diagnosis Date  . Hypertension   . Constipation   . Insomnia   . Diabetes mellitus without complication ([place])   . Cancer (Sandy [name])     liver mets; colon cancer  . 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 HOH (hard of hearing)   . Wears dentures     partial upper and lower    HOSPITAL COURSE:   * Neutropenic fever  She is currently on cefepime and vancomycin IV.  Urinalysis, chest x-ray are negative. Blood culture is sent negative so far.  No clear source of infection.  given IV Abx for 5 days.   Give oral for rest 3 days.   * A fib with RVR  Unclear etiology  Transfered to tele.  Checked Troponin, normal TSH  Cardio consult appreciated.  Cardizem inj given.  Switched to oral cardizem.   Converted to NSR, and rate is controlled now.  * Diabetes mellitus type 2  Hold metformin and continue insulin sliding scale coverage.   On discharge resume metformin.  * Metastatic colon cancer getting chemotherapy  Presented with the neutropenia, chemotherapy was last week.  Oncology consult. Leukocytes improved now.   Plan for chemo- next week. Follow in clinic.  * Essential hypertension  Controlled, continue home medication.  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 Physical therapy evaluation done suggest SNF.  Long acting pain meds working  better.  DISCHARGE CONDITIONS:   Stable.  CONSULTS OBTAINED:  Treatment Team:  [name], MD  DRUG ALLERGIES:  No Known Allergies  DISCHARGE MEDICATIONS:   Current Discharge Medication List    START taking these medications   Details  cefUROXime (CEFTIN) 250 MG tablet Take 1 tablet (250 mg total) by mouth 2 (two) times daily with a meal. Qty: 6 tablet, Refills: 0    diltiazem (CARDIZEM) 60 MG tablet Take 1 tablet (60 mg total) by mouth every 8 (eight) hours. Qty: 90 tablet, Refills: 0    docusate sodium (COLACE) 100 MG capsule Take 1 capsule (100 mg total) by mouth 2 (two) times daily. Qty: 10 capsule, Refills: 0    feeding supplement, ENSURE ENLIVE, (ENSURE ENLIVE) LIQD Take 237 mLs by mouth 2 (two) times daily between meals. Qty: 237 mL, Refills: 12    oxyCODONE (OXYCONTIN) 10 mg 12 hr tablet Take 1 tablet (10 mg total) by mouth every 12 (twelve) hours. Qty: 20 tablet, Refills: 0    traZODone (DESYREL) 50 MG tablet Take 0.5 tablets (25 mg total) by mouth at bedtime as needed for sleep. Qty: 20 tablet, Refills: 0      CONTINUE these medications which have CHANGED   Details  oxyCODONE (OXY IR/ROXICODONE) 5 MG immediate release tablet Take 1 tablet (5 mg total) by mouth every 6 (six) hours as needed for severe pain. Qty: 20 tablet, Refills: 0  CONTINUE these medications which have NOT CHANGED   Details  amLODipine (NORVASC) 5 MG tablet Take 5 mg by mouth daily. Reported on 09/24/2015    aspirin EC 81 MG tablet Take 81 mg by mouth daily. Reported on 08/26/2015    atorvastatin (LIPITOR) 40 MG tablet Take 40 mg by mouth daily.    gabapentin (NEURONTIN) 100 MG capsule Take 2 capsules (200 mg total) by mouth at bedtime. 1 tab daily and 2 tabs at bedtime. Qty: 60 capsule, Refills: 0    lidocaine-prilocaine (EMLA) cream Apply 1 application topically as needed. Apply to port 1 hour prior to chemotherapy appointment. Cover with plastic wrap. Qty: 30 g, Refills: 1    Associated Diagnoses: Port catheter in place    lisinopril (PRINIVIL,ZESTRIL) 10 MG tablet Take 10 mg by mouth daily.    magnesium 30 MG tablet Take 30 mg by mouth daily.     megestrol (MEGACE) 400 MG/10ML suspension Take 5 mLs (200 mg total) by mouth daily. to stimulate appetite Qty: 240 mL, Refills: 0    metFORMIN (GLUCOPHAGE) 1000 MG tablet Take 1,000 mg by mouth 2 (two) times daily.     Multiple Vitamin (MULTIVITAMIN WITH MINERALS) TABS tablet Take 1 tablet by mouth daily.    ondansetron (ZOFRAN) 4 MG tablet Take 1 tablet (4 mg total) by mouth every 8 (eight) hours as needed for nausea or vomiting. Qty: 30 tablet, Refills: 2    potassium chloride (K-DUR,KLOR-CON) 10 MEQ tablet Take 1 tablet (10 mEq total) by mouth once. Take 1 pill twice a day for 3 days then 1 pill a day. Qty: 30 tablet, Refills: 0      STOP taking these medications     metoprolol (LOPRESSOR) 100 MG tablet      sorbitol 70 % solution          DISCHARGE INSTRUCTIONS:    Follow with cancer center and cardiology clinic in 2 weeks/  If you experience worsening of your admission symptoms, develop shortness of breath, life threatening emergency, suicidal or homicidal thoughts you must seek medical attention immediately by calling 911 or calling your MD immediately  if symptoms less severe.  You Must read complete instructions/literature along with all the possible adverse reactions/side effects for all the Medicines you take and that have been prescribed to you. Take any new Medicines after you have completely understood and accept all the possible adverse reactions/side effects.   Please note  You were cared for by a hospitalist during your hospital stay. If you have any questions about your discharge medications or the care you received while you were in the hospital after you are discharged, you can call the unit and asked to speak with the hospitalist on call if the hospitalist that took care of you is  not available. Once you are discharged, your primary care physician will handle any further medical issues. Please note that NO REFILLS for any discharge medications will be authorized once you are discharged, as it is imperative that you return to your primary care physician (or establish a relationship with a primary care physician if you do not have one) for your aftercare needs so that they can reassess your need for medications and monitor your lab values.    Today   CHIEF COMPLAINT:   Chief Complaint  Patient presents with  . Altered Mental Status    HISTORY OF PRESENT ILLNESS:  Sandy [name]  is a 70 y.o. female with a known history of Stage IV  colon cancer on chemotherapy, last chemotherapy a week ago comes in with fever since yesterday. Temperature was around 100 Fahrenheit at home, patient ambulate in the emergency room on 02 Fahrenheit. No abdominal pain, nausea, vomiting, diarrhea. According to patient's daughter patient had a fall 2 times day before yesterday associated with generalized weakness, fatigue, poor by mouth intake for the past 2 days. Patient also noted to have lethargic since this morning. This morning she called ambulance because of decreased mental status, unable to even stand so they had to carry her on the stretcher.   VITAL SIGNS:  Blood pressure 137/68, pulse 63, temperature 98 F (36.7 C), temperature source Oral, resp. rate 16, height 5\' 4"  (1.626 m), weight 64.955 kg (143 lb 3.2 oz), SpO2 100 %.  I/O:    Intake/Output Summary (Last 24 hours) at 10/22/15 1513 Last data filed at 10/21/15 1950  Gross per 24 hour  Intake      0 ml  Output      0 ml  Net      0 ml    PHYSICAL EXAMINATION:   GENERAL: 70 y.o.-year-old patient lying in the bed with no acute distress.  EYES: Pupils equal, round, reactive to light and accommodation. No scleral icterus. Extraocular muscles intact.  HEENT: Head atraumatic, normocephalic. Oropharynx and nasopharynx  clear.  NECK: Supple, no jugular venous distention. No thyroid enlargement, no tenderness.  LUNGS: Normal breath sounds bilaterally, no wheezing, rales,rhonchi or crepitation. No use of accessory muscles of respiration.  CARDIOVASCULAR: S1, S2 fast. No murmurs, rubs, or gallops.  ABDOMEN: Soft, nontender, nondistended. Bowel sounds present. No organomegaly or mass.  EXTREMITIES: No pedal edema, cyanosis, or clubbing.  NEUROLOGIC: Cranial nerves II through XII are intact. Muscle strength 4/5 in all extremities. Sensation intact. Gait not checked.  PSYCHIATRIC: The patient is alert and oriented x 3.  SKIN: No obvious rash, lesion, or ulcer  DATA REVIEW:   CBC  Recent Labs Lab 10/22/15 0531  WBC 13.7*  HGB 9.1*  HCT 27.3*  PLT 350    Chemistries   Recent Labs Lab 10/18/15 0921  10/21/15 0457  NA 137  < > 142  K 4.2  < > 3.6  CL 105  < > 115*  CO2 23  < > 21*  GLUCOSE 120*  < > 100*  BUN 13  < > 7  CREATININE 1.05*  < > 0.66  CALCIUM 9.9  < > 9.3  AST 19  --   --   ALT 12*  --   --   ALKPHOS 129*  --   --   BILITOT 1.1  --   --   < > = values in this interval not displayed.  Cardiac Enzymes  Recent Labs Lab 10/20/15 1455  TROPONINI 0.03*    Microbiology Results  Results for orders placed or performed during the hospital encounter of 10/18/15  Blood Culture (routine x 2)     Status: None (Preliminary result)   Collection Time: 10/18/15  9:20 AM  Result Value Ref Range Status   Specimen Description BLOOD RIGHT HAND  Final   Special Requests [facility]  Final   Culture NO GROWTH 3 DAYS  Final   Report Status PENDING  Incomplete  Blood Culture (routine x 2)     Status: None (Preliminary result)   Collection Time: 10/18/15  9:21 AM  Result Value Ref Range Status   Specimen Description BLOOD LEFT ANTECUBITAL  Final   Special Requests BOTTLES DRAWN  AEROBIC AND ANAEROBIC 8CC  Final   Culture NO GROWTH 3 DAYS  Final   Report Status PENDING  Incomplete   Urine culture     Status: None   Collection Time: 10/18/15  9:21 AM  Result Value Ref Range Status   Specimen Description URINE, CLEAN CATCH  Final   Special Requests NONE  Final   Culture NO GROWTH Performed at [facility]   Final   Report Status 10/19/2015 FINAL  Final  MRSA PCR Screening     Status: None   Collection Time: 10/19/15  5:05 AM  Result Value Ref Range Status   MRSA by PCR NEGATIVE NEGATIVE Final    Comment:        The GeneXpert MRSA Assay (FDA approved for NASAL specimens only), is one component of a comprehensive MRSA colonization surveillance program. It is not intended to diagnose MRSA infection nor to guide or monitor treatment for MRSA infections.     RADIOLOGY:  No results found.  EKG:   Orders placed or performed during the hospital encounter of 10/18/15  . ED EKG 12-Lead  . ED EKG 12-Lead  . EKG 12-Lead  . EKG 12-Lead      Management plans discussed with the patient, family and they are in agreement.  CODE STATUS:  Code Status History    Date Active Date Inactive Code Status Order ID Comments User Context   08/28/2014  4:37 PM 08/29/2014  8:42 PM Full Code [id number]  Theodoro Grist, MD Inpatient      TOTAL TIME TAKING CARE OF THIS PATIENT: 35 minutes.    Vaughan Basta M.D on 10/22/2015 at 3:13 PM  Between 7am to 6pm - Pager - [phone]  After 6pm go to [email or web address] - password [email or web address] [facility]  Office  [phone]  CC: Primary care physician; [name], MD   Note: This dictation was prepared with Dragon dictation along with smaller phrase technology. Any transcriptional errors that result from this process are unintentional.

## 2015-10-22 NOTE — Clinical Social Work Placement (Signed)
   CLINICAL SOCIAL WORK PLACEMENT  NOTE  Date:  10/22/2015  Patient Details  Name: Sandy [name] MRN: [id number] Date of Birth: Oct 27, 1945  Clinical Social Work is seeking post-discharge placement for this patient at the [facility] level of care (*CSW will initial, date and re-position this form in  chart as items are completed):  Yes   Patient/family provided with [facility] Work Department's list of facilities offering this level of care within the geographic area requested by the patient (or if unable, by the patient's family).  Yes   Patient/family informed of their freedom to choose among providers that offer the needed level of care, that participate in Medicare, Medicaid or managed care program needed by the patient, have an available bed and are willing to accept the patient.  Yes   Patient/family informed of [facility]'s ownership interest in [facility] and [facility], as well as of the fact that they are under no obligation to receive care at these facilities.  PASRR submitted to EDS on 10/22/15     PASRR number received on 10/22/15     Existing PASRR number confirmed on       FL2 transmitted to all facilities in geographic area requested by pt/family on 10/22/15     FL2 transmitted to all facilities within larger geographic area on       Patient informed that his/her managed care company has contracts with or will negotiate with certain facilities, including the following:        Yes   Patient/family informed of bed offers received.  Patient chooses bed at  ([facility])     Physician recommends and patient chooses bed at      Patient to be transferred to  ([facility]) on 10/22/15.  Patient to be transferred to facility by  (Daughter )     Patient family notified on 10/22/15 of transfer.  Name of family member notified:   (Daughter)     PHYSICIAN       Additional Comment:     _______________________________________________ Baldemar Lenis, LCSW 10/22/2015, 2:19 PM

## 2015-10-23 LAB — CULTURE, BLOOD (ROUTINE X 2)
Culture: NO GROWTH
Culture: NO GROWTH

## 2015-10-28 ENCOUNTER — Other Ambulatory Visit: Payer: Self-pay | Admitting: Hematology and Oncology

## 2015-10-29 ENCOUNTER — Inpatient Hospital Stay: Payer: No Typology Code available for payment source

## 2015-10-29 ENCOUNTER — Encounter: Payer: Self-pay | Admitting: Hematology and Oncology

## 2015-10-29 ENCOUNTER — Inpatient Hospital Stay (HOSPITAL_BASED_OUTPATIENT_CLINIC_OR_DEPARTMENT_OTHER): Payer: No Typology Code available for payment source | Admitting: Hematology and Oncology

## 2015-10-29 VITALS — BP 120/77 | HR 56 | Temp 97.5°F | Resp 18 | Wt 146.4 lb

## 2015-10-29 DIAGNOSIS — Z87891 Personal history of nicotine dependence: Secondary | ICD-10-CM

## 2015-10-29 DIAGNOSIS — Z7982 Long term (current) use of aspirin: Secondary | ICD-10-CM

## 2015-10-29 DIAGNOSIS — C187 Malignant neoplasm of sigmoid colon: Secondary | ICD-10-CM

## 2015-10-29 DIAGNOSIS — C189 Malignant neoplasm of colon, unspecified: Secondary | ICD-10-CM

## 2015-10-29 DIAGNOSIS — C787 Secondary malignant neoplasm of liver and intrahepatic bile duct: Secondary | ICD-10-CM

## 2015-10-29 DIAGNOSIS — R109 Unspecified abdominal pain: Secondary | ICD-10-CM

## 2015-10-29 DIAGNOSIS — Z7689 Persons encountering health services in other specified circumstances: Secondary | ICD-10-CM | POA: Diagnosis not present

## 2015-10-29 DIAGNOSIS — R5383 Other fatigue: Secondary | ICD-10-CM

## 2015-10-29 DIAGNOSIS — E119 Type 2 diabetes mellitus without complications: Secondary | ICD-10-CM

## 2015-10-29 DIAGNOSIS — R197 Diarrhea, unspecified: Secondary | ICD-10-CM

## 2015-10-29 DIAGNOSIS — Z7984 Long term (current) use of oral hypoglycemic drugs: Secondary | ICD-10-CM

## 2015-10-29 DIAGNOSIS — R634 Abnormal weight loss: Secondary | ICD-10-CM

## 2015-10-29 DIAGNOSIS — Z79899 Other long term (current) drug therapy: Secondary | ICD-10-CM

## 2015-10-29 DIAGNOSIS — G4700 Insomnia, unspecified: Secondary | ICD-10-CM

## 2015-10-29 DIAGNOSIS — E041 Nontoxic single thyroid nodule: Secondary | ICD-10-CM

## 2015-10-29 DIAGNOSIS — D709 Neutropenia, unspecified: Secondary | ICD-10-CM | POA: Diagnosis not present

## 2015-10-29 DIAGNOSIS — G629 Polyneuropathy, unspecified: Secondary | ICD-10-CM

## 2015-10-29 DIAGNOSIS — Z9221 Personal history of antineoplastic chemotherapy: Secondary | ICD-10-CM

## 2015-10-29 DIAGNOSIS — K5900 Constipation, unspecified: Secondary | ICD-10-CM

## 2015-10-29 DIAGNOSIS — I10 Essential (primary) hypertension: Secondary | ICD-10-CM

## 2015-10-29 LAB — COMPREHENSIVE METABOLIC PANEL
ALT: 8 U/L — ABNORMAL LOW (ref 14–54)
AST: 19 U/L (ref 15–41)
Albumin: 2.9 g/dL — ABNORMAL LOW (ref 3.5–5.0)
Alkaline Phosphatase: 108 U/L (ref 38–126)
Anion gap: 7 (ref 5–15)
BUN: 11 mg/dL (ref 6–20)
CO2: 24 mmol/L (ref 22–32)
Calcium: 9.4 mg/dL (ref 8.9–10.3)
Chloride: 106 mmol/L (ref 101–111)
Creatinine, Ser: 0.8 mg/dL (ref 0.44–1.00)
GFR calc Af Amer: >60 mL/min (ref >60)
GFR calc non Af Amer: >60 mL/min (ref >60)
Glucose, Bld: 122 mg/dL — ABNORMAL HIGH (ref 65–99)
Potassium: 4.2 mmol/L (ref 3.5–5.1)
Sodium: 137 mmol/L (ref 135–145)
Total Bilirubin: 0.8 mg/dL (ref 0.3–1.2)
Total Protein: 6.7 g/dL (ref 6.5–8.1)

## 2015-10-29 LAB — CBC WITH DIFFERENTIAL/PLATELET
Basophils Absolute: 0 10*3/uL (ref 0–0.1)
Basophils Relative: 0 %
Eosinophils Absolute: 0 10*3/uL (ref 0–0.7)
Eosinophils Relative: 0 %
HCT: 27.5 % — ABNORMAL LOW (ref 35.0–47.0)
Hemoglobin: 9.2 g/dL — ABNORMAL LOW (ref 12.0–16.0)
Lymphocytes Relative: 13 %
Lymphs Abs: 1.8 10*3/uL (ref 1.0–3.6)
MCH: 29.2 pg (ref 26.0–34.0)
MCHC: 33.5 g/dL (ref 32.0–36.0)
MCV: 87.1 fL (ref 80.0–100.0)
Monocytes Absolute: 0.9 10*3/uL (ref 0.2–0.9)
Monocytes Relative: 6 %
Neutro Abs: 11.1 10*3/uL — ABNORMAL HIGH (ref 1.4–6.5)
Neutrophils Relative %: 81 %
Platelets: 590 10*3/uL — ABNORMAL HIGH (ref 150–440)
RBC: 3.16 MIL/uL — ABNORMAL LOW (ref 3.80–5.20)
RDW: 17.3 % — ABNORMAL HIGH (ref 11.5–14.5)
WBC: 13.8 10*3/uL — ABNORMAL HIGH (ref 3.6–11.0)

## 2015-10-29 LAB — MAGNESIUM: Magnesium: 1.8 mg/dL (ref 1.7–2.4)

## 2015-10-29 MED ORDER — SODIUM CHLORIDE 0.9 % IV SOLN
2400.0000 mg/m2 | INTRAVENOUS | Status: AC
  Administered 2015-10-29: 4200 mg via INTRAVENOUS
  Filled 2015-10-29: qty 84

## 2015-10-29 MED ORDER — FLUOROURACIL CHEMO INJECTION 2.5 GM/50ML
400.0000 mg/m2 | Freq: Once | INTRAVENOUS | Status: AC
  Administered 2015-10-29: 700 mg via INTRAVENOUS
  Filled 2015-10-29: qty 14

## 2015-10-29 MED ORDER — LEUCOVORIN CALCIUM INJECTION 350 MG
400.0000 mg/m2 | Freq: Once | INTRAVENOUS | Status: AC
  Administered 2015-10-29: 700 mg via INTRAVENOUS
  Filled 2015-10-29: qty 30

## 2015-10-29 MED ORDER — IRINOTECAN HCL CHEMO INJECTION 100 MG/5ML
125.0000 mg/m2 | Freq: Once | INTRAVENOUS | Status: AC
  Administered 2015-10-29: 218 mg via INTRAVENOUS
  Filled 2015-10-29: qty 8

## 2015-10-29 MED ORDER — SODIUM CHLORIDE 0.9 % IV SOLN
Freq: Once | INTRAVENOUS | Status: AC
Start: 2015-10-29 — End: 2015-10-29
  Administered 2015-10-29: 12:00:00 via INTRAVENOUS
  Filled 2015-10-29: qty 1000

## 2015-10-29 MED ORDER — ATROPINE SULFATE 1 MG/ML IJ SOLN
0.5000 mg | Freq: Once | INTRAMUSCULAR | Status: AC | PRN
  Administered 2015-10-29: 0.5 mg via INTRAVENOUS
  Filled 2015-10-29: qty 1

## 2015-10-29 MED ORDER — PALONOSETRON HCL INJECTION 0.25 MG/5ML
0.2500 mg | Freq: Once | INTRAVENOUS | Status: AC
Start: 2015-10-29 — End: 2015-10-29
  Administered 2015-10-29: 0.25 mg via INTRAVENOUS
  Filled 2015-10-29: qty 5

## 2015-10-29 MED ORDER — SODIUM CHLORIDE 0.9 % IV SOLN
10.0000 mg | Freq: Once | INTRAVENOUS | Status: AC
  Administered 2015-10-29: 10 mg via INTRAVENOUS
  Filled 2015-10-29: qty 1

## 2015-10-29 NOTE — Progress Notes (Signed)
Patient is here for a follow up, no complaints.

## 2015-10-29 NOTE — Progress Notes (Signed)
[facility] day:  10/29/2015   Chief Complaint: Sandy [name] is a 70 y.o. female with stage IV colon cancer who is seen for assessment prior to cycle #2 FOLFIRI chemotherapy.  HPI: The patient was last seen in the medical oncology clinic on 10/14/2015.  At that time, she was seen for assessment on day 7 of cycle #1 FOLFIRI.  She was neutropenic (Sandy [name]).  She began daily Granix.  She was admitted from 10/18/2015 - 10/22/2015 with fever and neutropenia.  She was treated with Cefepime and vancomycin.  Blood and urine cultures remained negative.  CXR was negative.  She was discharged on Ceftin x 3 days.  Course was complicated by atrial fibrillation with RVR.  She received diltiazem.  Since discharge, she has been at [facility].  She is undergoing rehabilitation.  She is eating well.  She denies any diarrhea.  She has 2 bowel movements/week.  She denies any melena or hematochezia.  She states that her dog, YaYa, stays with her.   Past Medical History  Diagnosis Date  . Hypertension   . Constipation   . Insomnia   . Diabetes mellitus without complication ([place])   . Cancer ([place])     liver mets; colon cancer  . 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 HOH (hard of hearing)   . Wears dentures     partial upper and lower    Past Surgical History  Procedure Laterality Date  . Colonoscopy with propofol N/A 09/07/2014    Procedure: COLONOSCOPY WITH PROPOFOL;  Surgeon: Lucilla Lame, MD;  Location: [facility] ENDOSCOPY;  Service: Endoscopy;  Laterality: N/A;  . Esophagogastroduodenoscopy N/A 09/07/2014    Procedure: ESOPHAGOGASTRODUODENOSCOPY (EGD);  Surgeon: Lucilla Lame, MD;  Location: [facility] ENDOSCOPY;  Service: Endoscopy;  Laterality: N/A;  . Portacath placement Left 09/24/2014    Procedure: INSERTION PORT-A-CATH;  Surgeon: Robert Bellow, MD;  Location: [facility] ORS;  Service: General;  Laterality: Left;  . Liver biopsy    .  Cardiac catheterization    . Flexible sigmoidoscopy N/A 09/02/2015    Procedure: FLEXIBLE SIGMOIDOSCOPY;  Surgeon: Lucilla Lame, MD;  Location: [facility];  Service: Endoscopy;  Laterality: N/A;  Diabetic - oral meds Pt has port-a-cath    Family History  Problem Relation Age of Onset  . Cancer Sister     Social History:  reports that she quit smoking about 2 years ago. Her smoking use included Cigarettes. She has a 5 pack-year smoking history. She has never used smokeless tobacco. She reports that she does not drink alcohol or use illicit drugs.  The patient is accompanied by her daughter.   Allergies: No Known Allergies  Current Medications: Current Outpatient Prescriptions  Medication Sig Dispense Refill  . amLODipine (NORVASC) 5 MG tablet Take 5 mg by mouth daily. Reported on 09/24/2015    . aspirin EC 81 MG tablet Take 81 mg by mouth daily. Reported on 08/26/2015    . atorvastatin (LIPITOR) 40 MG tablet Take 40 mg by mouth daily.    . cefUROXime (CEFTIN) 250 MG tablet Take 1 tablet (250 mg total) by mouth 2 (two) times daily with a meal. 6 tablet 0  . diltiazem (CARDIZEM) 60 MG tablet Take 1 tablet (60 mg total) by mouth every 8 (eight) hours. 90 tablet 0  . docusate sodium (COLACE) 100 MG capsule Take 1 capsule (100 mg total) by mouth 2 (two) times  daily. 10 capsule 0  . feeding supplement, ENSURE ENLIVE, (ENSURE ENLIVE) LIQD Take 237 mLs by mouth 2 (two) times daily between meals. 237 mL 12  . gabapentin (NEURONTIN) 100 MG capsule Take 2 capsules (200 mg total) by mouth at bedtime. 1 tab daily and 2 tabs at bedtime. 60 capsule 0  . lidocaine-prilocaine (EMLA) cream Apply 1 application topically as needed. Apply to port 1 hour prior to chemotherapy appointment. Cover with plastic wrap. 30 g 1  . lisinopril (PRINIVIL,ZESTRIL) 10 MG tablet Take 10 mg by mouth daily.    . magnesium 30 MG tablet Take 30 mg by mouth daily.     . megestrol (MEGACE) [name] MG/10ML suspension Take 5 mLs  (200 mg total) by mouth daily. to stimulate appetite 240 mL 0  . metFORMIN (GLUCOPHAGE) 1000 MG tablet Take 1,000 mg by mouth 2 (two) times daily.     . Multiple Vitamin (MULTIVITAMIN WITH MINERALS) TABS tablet Take 1 tablet by mouth daily.    . ondansetron (ZOFRAN) 4 MG tablet Take 1 tablet (4 mg total) by mouth every 8 (eight) hours as needed for nausea or vomiting. 30 tablet 2  . oxyCODONE (OXY IR/ROXICODONE) 5 MG immediate release tablet Take 1 tablet (5 mg total) by mouth every 6 (six) hours as needed for severe pain. 20 tablet 0  . oxyCODONE (OXYCONTIN) 10 mg 12 hr tablet Take 1 tablet (10 mg total) by mouth every 12 (twelve) hours. 20 tablet 0  . potassium chloride (K-DUR,KLOR-CON) 10 MEQ tablet Take 1 tablet (10 mEq total) by mouth once. Take 1 pill twice a day for 3 days then 1 pill a day. 30 tablet 0  . traZODone (DESYREL) 50 MG tablet Take 0.5 tablets (25 mg total) by mouth at bedtime as needed for sleep. 20 tablet 0   No current facility-administered medications for this visit.   Facility-Administered Medications Ordered in Other Visits  Medication Dose Route Frequency Provider Last Rate Last Dose  . acetaminophen (TYLENOL) tablet 650 mg  650 mg Oral Once Lequita Asal, MD   650 mg at 10/19/14 0093  . sodium chloride 0.9 % injection 10 mL  10 mL Intracatheter PRN Lequita Asal, MD   10 mL at 10/11/14 1431  . sodium chloride 0.9 % injection 10 mL  10 mL Intracatheter PRN Lequita Asal, MD   10 mL at 02/28/15 1529    Review of Systems:  GENERAL: Feels better.  No fevers or sweats.  Weight up 2 pounds. PERFORMANCE STATUS (ECOG): 2. HEENT: Wears a hearing aide. No visual changes, runny nose, sore throat, mouth sores or tenderness. Lungs: No shortness of breath or cough. No hemoptysis. Cardiac: No chest pain, palpitations, orthopnea, or PND. GI:  Eating "good".  Bowel movements 2/week.  No nausea, vomiting, diarrhea, constipation, melena or hematochezia. GU:  Wears a Depends pad.  No dysuria or hematuria. Musculoskeletal: Denies back pain. No joint pain. No muscle tenderness. Extremities: No pain or swelling. Skin: No rashes, skin lesions, or ulcers Neuro:  Neuropathy in fingers and bottom of feet (stable).  No headache, numbness or weakness, or balance issues. Endocrine: Diabetes.  No thyroid issues, hot flashes or night sweats. Psych: No depression.  No mood changes or anxiety. Pain: Takes oxycodone prn. Review of systems: All other systems reviewed and found to be negative.  Physical Exam: Blood pressure 120/77, pulse 56, temperature 97.5 F (36.4 C), temperature source Tympanic, resp. rate 18, weight 146 lb 6.2 oz (66.4 kg). GENERAL:  Elderly woman sitting comfortably in a wheelchair in the exam room in no acute distress. MENTAL STATUS: Alert and oriented to person, place and time. HEAD: Alopecia totalis. Normocephalic, atraumatic, face symmetric, no Cushingoid features. EYES: Blue rimmed glasses. Hazel/green eyes. Pupils equal round and reactive to light and accomodation. No conjunctivitis or scleral icterus. ENT: Oropharynx clear without lesion. Partial. Tongue normal. Mucous membranes moist.  RESPIRATORY: Clear to auscultation without rales, wheezes or rhonchi. CARDIOVASCULAR: Regular rate and rhythm without murmur, rub or gallop. ABDOMEN: Soft, non-tender with active bowel sounds. No guarding or rebound tenderness. No hepatosplenomegaly. SKIN: No rashes, ulcer or skin lesions.  EXTREMITIES: No edema, no skin discoloration or tenderness. No palpable cords. LYMPH NODES: Right low anterior cervical 2-3 cm soft mass (thyroid).  No palpable supraclavicular, axillary or inguinal adenopathy  NEUROLOGICAL: Appropriate. PSYCH:  Appropriate.  Affect normal.   Appointment on 10/29/2015  Component Date Value Ref Range Status  . WBC 10/29/2015 13.8* 3.6 - 11.0 K/uL Final  . RBC 10/29/2015 3.16* 3.80 - 5.20 MIL/uL Final   . Hemoglobin 10/29/2015 9.2* 12.0 - 16.0 g/dL Final  . HCT 10/29/2015 27.5* 35.0 - 47.0 % Final  . MCV 10/29/2015 87.1  80.0 - 100.0 fL Final  . MCH 10/29/2015 29.2  26.0 - 34.0 pg Final  . MCHC 10/29/2015 33.5  32.0 - 36.0 g/dL Final  . RDW 10/29/2015 17.3* 11.5 - 14.5 % Final  . Platelets 10/29/2015 590* 150 - 440 K/uL Final  . Neutrophils Relative % 10/29/2015 81   Final  . Neutro Abs 10/29/2015 11.1* 1.4 - 6.5 K/uL Final  . Lymphocytes Relative 10/29/2015 13   Final  . Lymphs Abs 10/29/2015 1.8  1.0 - 3.6 K/uL Final  . Monocytes Relative 10/29/2015 6   Final  . Monocytes Absolute 10/29/2015 0.9  0.2 - 0.9 K/uL Final  . Eosinophils Relative 10/29/2015 0   Final  . Eosinophils Absolute 10/29/2015 0.0  0 - 0.7 K/uL Final  . Basophils Relative 10/29/2015 0   Final  . Basophils Absolute 10/29/2015 0.0  0 - 0.1 K/uL Final  . Sodium 10/29/2015 137  135 - 145 mmol/L Final  . Potassium 10/29/2015 4.2  3.5 - 5.1 mmol/L Final  . Chloride 10/29/2015 106  101 - 111 mmol/L Final  . CO2 10/29/2015 24  22 - 32 mmol/L Final  . Glucose, Bld 10/29/2015 122* 65 - 99 mg/dL Final  . BUN 10/29/2015 11  6 - 20 mg/dL Final  . Creatinine, Ser 10/29/2015 0.80  0.44 - 1.00 mg/dL Final  . Calcium 10/29/2015 9.4  8.9 - 10.3 mg/dL Final  . Total Protein 10/29/2015 6.7  6.5 - 8.1 g/dL Final  . Albumin 10/29/2015 2.9* 3.5 - 5.0 g/dL Final  . AST 10/29/2015 19  15 - 41 U/L Final  . ALT 10/29/2015 8* 14 - 54 U/L Final  . Alkaline Phosphatase 10/29/2015 108  38 - 126 U/L Final  . Total Bilirubin 10/29/2015 0.8  0.3 - 1.2 mg/dL Final  . GFR calc non Af Amer 10/29/2015 >60  >60 mL/min Final  . GFR calc Af Amer 10/29/2015 >60  >60 mL/min Final   Comment: (NOTE) The eGFR has been calculated using the CKD EPI equation. This calculation has not been validated in all clinical situations. eGFR's persistently <60 mL/min signify possible Chronic Kidney Disease.   . Anion gap 10/29/2015 7  5 - 15 Final  . Magnesium  10/29/2015 1.8  1.7 - 2.4 mg/dL Final    Assessment:  JIMMYE WISNIESKI is a 70 y.o. female with metastatic colon cancer. She presented with a 53 pound weight loss in 6 months. Colonoscopy on 09/07/2014 revealed a circumferential partially obstructive fungating mass in the descending colon. There was no active bleeding. There was a 1.5 cm pedunculated polyp in the sigmoid colon (tubulovillous adenoma). Pathology from the descending colon mass revealed fragments of adenocarcinoma. There was not enough tissue for KRAS testing.  CEA was 11,793 on 09/05/2014.  KRAS and NRAS mutational analysis on 09/02/2015 was incomplete.  KRAS results from analysis of codons 59, 61, and 117 were negative.  Results were not obtained for codons 12, 13, and 146.  NRAS gene results from analysis of codons 12 and 13 were negative.  Results were not obtained for codons 59, 69, 61, 117, and 146.  Chest CT angiogram on 08/28/2014 revealed no evidence of pulmonary embolism, but multiple large enhancing liver masses (largest 6.4 cm).  PET scan on 10/04/2014 revealed 6.5 x 6.1 cm hypermetabolic mass in the proximal sigmoid colon,  There were  numerous large hypermetabolic hepatic metastases. There were no additional sites of metastatic disease in the neck, chest or skeleton.  In addition, there was a 3.2 x 2.8 cm low-attenuation right thyroid nodule.   She is status post 14 cycles of FOLFOX chemotherapy (10/09/2014 - 04/11/2015).  She declined Avastin.  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    CEA was 10,169 on 10/09/2014 , 12,034 on 0712/2016, 7765 on 11/20/2014, 5154 on 12/04/2014, 1921 on 01/01/2015, 1266 on 01/15/2015, 770.6 on 01/29/2015, 502.7 on 02/12/2015, 368.7 on 02/26/2015, 269 on 03/14/2015, 178.3 on 03/28/2015, 135.9 on 04/11/2015, 116.1 on 05/13/2015, 90.8 on 06/10/2015, 145.9 on 07/08/2015, 300.7 on 08/05/2015, 296.3 on 08/19/2015, and 443.0 on 09/24/2015, and 1341 on  10/08/2015.  Abdominal and pelvic CT scan on 12/28/2014 revealed response to therapy.  Liver metastases and the descending/sigmoid mass were smaller.  There was no new disease.  Abdomen and pelvic CT scan on 03/25/2015 revealed interval decrease in size of hepatic metastasis and distal descending colon mass.   Abdominal and pelvic CT scan on 03/25/2015 revealed decreasing hepatic metastasis and distal descending colon mass.  Right hepatic lobe lesion was 2.2 x 2.1 cm.  Left lateral hepatic lobe lesion was 1.9 x 1.5 cm.  There was stable thickening of the adrenal glands.  She is status post 8 cycles of 5-fluorouracil and leucovorin (04/28/2014 - 09/24/2015).  Cycle #4 was held on 06/10/2015 secondary to rectal bleeding.  Abdominal and pelvic MRI on 08/01/2015 revealed multiple hypoenhancing hepatic metastasis in both lobes of the liver (3.3 x 2.2 cm in segment VII, 5.9 x 4.1 cm in segment VIII, and a 2.4 x 1.7 cm caudate lesion).  There was a 4.3 cm length of focally narrowed sigmoid colon.  There was nodular thickening of the right adrenal gland.  PET scan on 08/21/2015 revealed interval improvement in the multifocal hepatic metastatic disease. There was residual hypermetabolic tumor.  The primary colonic lesion at the junction of the descending and sigmoid colon had decreased in size and metabolic activity.  There was persistent hypermetabolic activity in the lower right neck, possibly associated with the thyroid gland.   Abdominal and pelvic CT scan on 10/04/2015 revealed increased bulkiness of soft tissue mass in distal descending colon compared with previous studies, consistent with primary colon carcinoma.  The mass measured 4.7 cm compared to 3.3 cm.  The liver metastasis were felt larger by second radiology review.  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  Afirma gene expression classifier GEC) was  suspicious.  Gene expression signature for medullary thyroid cancer (MTC) and BRAF V600E were negative.  SHe is s/p 1 cycle of FOLFIRI chemotherapy (10/08/2015).  Cycle #1 complicated by an admission for fever and neutropenia on day 11 (10/18/2015).  Al cultures were negative.  She was discharged to [facility] (rehabilitation facility).    Symptomatically, she feels better.  She is ready for cycle #2 FOLFIRI.  Exam is stable.  Corrected calcium is 10.28.  Plan: 1.  Labs today:  CBC with diff, CMP, Mg. 2.  Discuss complications associated with cycle #1 FOLFIRI.  Discuss plans for for the same dose of irinotecan, but with Neulasta support at disconnect.  Discuss Claritin to prevent Neulasta induced bone pain. 3.  Cycle #2 FOLFIRI today. 4.  RTC in 2 days for disconnect and Neulasta 5.  RTC in 1 week for CBC with diff 6.  RTC in 2 weeks for MD assess, labs (CBC with diff, CMP, Mg, CEA) and cycle #3 FOLFIRI (advance dose as tolerated).   Lequita Asal, MD  10/29/2015, 10:42 AM

## 2015-10-31 ENCOUNTER — Other Ambulatory Visit: Payer: Self-pay | Admitting: Hematology and Oncology

## 2015-10-31 ENCOUNTER — Inpatient Hospital Stay: Payer: No Typology Code available for payment source

## 2015-10-31 NOTE — Progress Notes (Unsigned)
Pt came in today to have pump removed. Pt brought in by daughter who states that "the rehab facility that she is staying at d/c'd the pump and flushed it", pt also confirms this and states she told the staff at her rehab facility "not to mess with it" but they were adamant about removing it and flushing her port. Deaccessed pts port and informed pt and her daughter that this is not safe for them to do as they may have exposed themselves to chemo. Pt/pts daughter state they will inform the facility of this and Blima Singer., RN is calling to inform them as well.

## 2015-11-01 ENCOUNTER — Inpatient Hospital Stay: Payer: No Typology Code available for payment source

## 2015-11-01 ENCOUNTER — Telehealth: Payer: Self-pay

## 2015-11-01 DIAGNOSIS — C787 Secondary malignant neoplasm of liver and intrahepatic bile duct: Principal | ICD-10-CM

## 2015-11-01 DIAGNOSIS — C189 Malignant neoplasm of colon, unspecified: Secondary | ICD-10-CM

## 2015-11-01 MED ORDER — PEGFILGRASTIM INJECTION 6 MG/0.6ML ~~LOC~~
6.0000 mg | PREFILLED_SYRINGE | Freq: Once | SUBCUTANEOUS | Status: AC
  Administered 2015-11-01: 6 mg via SUBCUTANEOUS
  Filled 2015-11-01: qty 0.6

## 2015-11-01 NOTE — Telephone Encounter (Signed)
Called and spoke with daughter regarding the need for pt to return to clinic for Nuelasta injection.  Daughter reported that she will get ready and go get her mother and bring her in.  Scheduling aware encounter made, chemo charge RN aware pt did not receive yesterday and will be returning to clinic today for it.

## 2015-11-01 NOTE — Telephone Encounter (Signed)
Returned a phone call from a [place] from [employer].  Tye Maryland called and wanted to know if [facility] chemo pump at facility was incorrect.  I verbalized that all pt's with pumps are required to return to clinic to have their pumps removed, verified that the medication was given, and disposed of properly.  That per chemo charge RN there was not a cap in place and they could have potentially been exposed to the chemo.  Tye Maryland verbalized an understanding and stated they were making a protocol to prevent this from happening again and that she was the nurse who dc'd and flushed port.

## 2015-11-05 ENCOUNTER — Emergency Department
Admission: EM | Admit: 2015-11-05 | Discharge: 2015-11-05 | Disposition: A | Discharge status: Home / Self Care | Payer: Medicare Other | Source: Home / Self Care | Attending: Emergency Medicine | Admitting: Emergency Medicine

## 2015-11-05 ENCOUNTER — Encounter: Payer: Self-pay | Admitting: Emergency Medicine

## 2015-11-05 ENCOUNTER — Inpatient Hospital Stay: Payer: No Typology Code available for payment source

## 2015-11-05 ENCOUNTER — Emergency Department: Payer: Medicare Other

## 2015-11-05 ENCOUNTER — Telehealth: Payer: Self-pay | Admitting: *Deleted

## 2015-11-05 DIAGNOSIS — C187 Malignant neoplasm of sigmoid colon: Secondary | ICD-10-CM

## 2015-11-05 DIAGNOSIS — R112 Nausea with vomiting, unspecified: Secondary | ICD-10-CM | POA: Insufficient documentation

## 2015-11-05 DIAGNOSIS — Z7984 Long term (current) use of oral hypoglycemic drugs: Secondary | ICD-10-CM | POA: Insufficient documentation

## 2015-11-05 DIAGNOSIS — Z792 Long term (current) use of antibiotics: Secondary | ICD-10-CM

## 2015-11-05 DIAGNOSIS — R4182 Altered mental status, unspecified: Secondary | ICD-10-CM | POA: Diagnosis not present

## 2015-11-05 DIAGNOSIS — R197 Diarrhea, unspecified: Secondary | ICD-10-CM | POA: Insufficient documentation

## 2015-11-05 DIAGNOSIS — A4159 Other Gram-negative sepsis: Secondary | ICD-10-CM | POA: Diagnosis not present

## 2015-11-05 DIAGNOSIS — Z79899 Other long term (current) drug therapy: Secondary | ICD-10-CM | POA: Insufficient documentation

## 2015-11-05 DIAGNOSIS — Z7982 Long term (current) use of aspirin: Secondary | ICD-10-CM | POA: Insufficient documentation

## 2015-11-05 DIAGNOSIS — C189 Malignant neoplasm of colon, unspecified: Secondary | ICD-10-CM | POA: Diagnosis not present

## 2015-11-05 DIAGNOSIS — C787 Secondary malignant neoplasm of liver and intrahepatic bile duct: Secondary | ICD-10-CM | POA: Insufficient documentation

## 2015-11-05 DIAGNOSIS — I10 Essential (primary) hypertension: Secondary | ICD-10-CM

## 2015-11-05 DIAGNOSIS — E119 Type 2 diabetes mellitus without complications: Secondary | ICD-10-CM | POA: Insufficient documentation

## 2015-11-05 DIAGNOSIS — Z87891 Personal history of nicotine dependence: Secondary | ICD-10-CM | POA: Insufficient documentation

## 2015-11-05 DIAGNOSIS — R109 Unspecified abdominal pain: Secondary | ICD-10-CM | POA: Insufficient documentation

## 2015-11-05 LAB — URINALYSIS COMPLETE WITH MICROSCOPIC (ARMC ONLY)
BACTERIA UA: NONE SEEN
BILIRUBIN URINE: NEGATIVE
Glucose, UA: NEGATIVE mg/dL
HGB URINE DIPSTICK: NEGATIVE
KETONES UR: NEGATIVE mg/dL
LEUKOCYTES UA: NEGATIVE
NITRITE: NEGATIVE
PH: 5 (ref 5.0–8.0)
PROTEIN: 30 mg/dL — AB
Specific Gravity, Urine: 1.013 (ref 1.005–1.030)

## 2015-11-05 LAB — CBC WITH DIFFERENTIAL/PLATELET
Basophils Absolute: 0 10*3/uL (ref 0–0.1)
Basophils Absolute: 0 K/uL (ref 0–0.1)
Basophils Relative: 1 %
Basophils Relative: 6 %
Eosinophils Absolute: 0 10*3/uL (ref 0–0.7)
Eosinophils Absolute: 0 K/uL (ref 0–0.7)
Eosinophils Relative: 1 %
Eosinophils Relative: 1 %
HCT: 27 % — ABNORMAL LOW (ref 35.0–47.0)
HCT: 24.5 % — ABNORMAL LOW (ref 35.0–47.0)
Hemoglobin: 9 g/dL — ABNORMAL LOW (ref 12.0–16.0)
Hemoglobin: 8.4 g/dL — ABNORMAL LOW (ref 12.0–16.0)
Lymphocytes Relative: 74 %
Lymphocytes Relative: 70 %
Lymphs Abs: 0.8 10*3/uL — ABNORMAL LOW (ref 1.0–3.6)
Lymphs Abs: 0.5 K/uL — ABNORMAL LOW (ref 1.0–3.6)
MCH: 29.2 pg (ref 26.0–34.0)
MCH: 29.9 pg (ref 26.0–34.0)
MCHC: 33.4 g/dL (ref 32.0–36.0)
MCHC: 34.1 g/dL (ref 32.0–36.0)
MCV: 87.4 fL (ref 80.0–100.0)
MCV: 87.7 fL (ref 80.0–100.0)
Monocytes Absolute: 0 10*3/uL — ABNORMAL LOW (ref 0.2–0.9)
Monocytes Absolute: 0 K/uL — ABNORMAL LOW (ref 0.2–0.9)
Monocytes Relative: 2 %
Monocytes Relative: 3 %
Neutro Abs: 0.2 10*3/uL — ABNORMAL LOW (ref 1.7–7.7)
Neutro Abs: 0.2 K/uL — ABNORMAL LOW (ref 1.4–6.5)
Neutrophils Relative %: 22 %
Neutrophils Relative %: 20 %
Platelets: 296 10*3/uL (ref 150–440)
Platelets: 243 K/uL (ref 150–440)
RBC: 3.08 MIL/uL — ABNORMAL LOW (ref 3.80–5.20)
RBC: 2.79 MIL/uL — ABNORMAL LOW (ref 3.80–5.20)
RDW: 17.1 % — ABNORMAL HIGH (ref 11.5–14.5)
RDW: 16.9 % — ABNORMAL HIGH (ref 11.5–14.5)
WBC: 1.1 10*3/uL — CL (ref 3.6–11.0)
WBC: 0.8 K/uL — CL (ref 3.6–11.0)

## 2015-11-05 LAB — COMPREHENSIVE METABOLIC PANEL WITH GFR
ALT: 8 U/L — ABNORMAL LOW (ref 14–54)
AST: 16 U/L (ref 15–41)
Albumin: 2.9 g/dL — ABNORMAL LOW (ref 3.5–5.0)
Alkaline Phosphatase: 88 U/L (ref 38–126)
Anion gap: 10 (ref 5–15)
BUN: 26 mg/dL — ABNORMAL HIGH (ref 6–20)
CO2: 20 mmol/L — ABNORMAL LOW (ref 22–32)
Calcium: 9.6 mg/dL (ref 8.9–10.3)
Chloride: 108 mmol/L (ref 101–111)
Creatinine, Ser: 1.39 mg/dL — ABNORMAL HIGH (ref 0.44–1.00)
GFR calc Af Amer: 43 mL/min — ABNORMAL LOW
GFR calc non Af Amer: 37 mL/min — ABNORMAL LOW
Glucose, Bld: 112 mg/dL — ABNORMAL HIGH (ref 65–99)
Potassium: 4.7 mmol/L (ref 3.5–5.1)
Sodium: 138 mmol/L (ref 135–145)
Total Bilirubin: 0.6 mg/dL (ref 0.3–1.2)
Total Protein: 6.9 g/dL (ref 6.5–8.1)

## 2015-11-05 LAB — LACTIC ACID, PLASMA: LACTIC ACID, VENOUS: 1.4 mmol/L (ref 0.5–1.9)

## 2015-11-05 LAB — LIPASE, BLOOD: LIPASE: 18 U/L (ref 11–51)

## 2015-11-05 MED ORDER — IOPAMIDOL (ISOVUE-300) INJECTION 61%
80.0000 mL | Freq: Once | INTRAVENOUS | Status: AC | PRN
  Administered 2015-11-05: 80 mL via INTRAVENOUS

## 2015-11-05 MED ORDER — DIATRIZOATE MEGLUMINE & SODIUM 66-10 % PO SOLN
15.0000 mL | Freq: Once | ORAL | Status: AC
  Administered 2015-11-05: 15 mL via ORAL

## 2015-11-05 MED ORDER — SODIUM CHLORIDE 0.9 % IV BOLUS (SEPSIS)
1000.0000 mL | Freq: Once | INTRAVENOUS | Status: AC
  Administered 2015-11-05: 1000 mL via INTRAVENOUS

## 2015-11-05 MED ORDER — ONDANSETRON HCL 4 MG/2ML IJ SOLN
4.0000 mg | Freq: Once | INTRAMUSCULAR | Status: AC
  Administered 2015-11-05: 4 mg via INTRAVENOUS
  Filled 2015-11-05: qty 2

## 2015-11-05 NOTE — ED Notes (Signed)
Patient transported to CT 

## 2015-11-05 NOTE — ED Triage Notes (Addendum)
Pt here with c/o left sided abdominal pain, daughter reports pt is stage 4 liver cancer patient. Pt's last chemo was 1 week ago, pt seen at cancer center today for labs, WBC 1.1. Daughter reports 5mg  of oxycodone PTA.

## 2015-11-05 NOTE — ED Provider Notes (Signed)
-----------------------------------------   3:44 PM on 11/05/2015 -----------------------------------------   Blood pressure 106/67, pulse 87, temperature 98 F (36.7 C), temperature source Oral, resp. rate 16, height 5\' 6"  (1.676 m), weight 64.9 kg, SpO2 100 %.  Assuming care from Dr. Alfred Levins.  In short, Sandy [name] is a 70 y.o. female with a chief complaint of Abdominal Pain .  Refer to the original H&P for additional details.  The current plan of care is to follow-up CT scan and reassess.   ----------------------------------------- 5:14 PM on 11/05/2015 -----------------------------------------  Ct Abdomen Pelvis W Contrast  Result Date: 11/05/2015 CLINICAL DATA:  Colorectal carcinoma. Stage IV colon cancer with liver metastasis. Chemotherapy ongoing. EXAM: CT ABDOMEN AND PELVIS WITH CONTRAST TECHNIQUE: Multidetector CT imaging of the abdomen and pelvis was performed using the standard protocol following bolus administration of intravenous contrast. CONTRAST:  48mL ISOVUE-300 IOPAMIDOL (ISOVUE-300) INJECTION 61% COMPARISON:  CT 10/04/2015, PET-CT 08/21/2015 FINDINGS: Lower chest: Lung bases are clear. Hepatobiliary: Again demonstrated partially calcified lesions in the LEFT and RIGHT hepatic lobe. Largest lesion in the RIGHT lateral hepatic lobe with subcapsular retraction measures approximately 6.4 by 4.2 cm compared to 6.2 by 4.0 cm on prior remeasured for no significant change. Calcified lesion in the caudate lobe measures 19 mm compared to 22 mm. Lesion in the LEFT hepatic lobe measures 12 mm (image 15, series 2) increased from 9 mm. Lesion inferior LEFT hepatic lobe measuring 10 mm image 19, series 2 not changed from 10 mm. No new lesions identified. Pancreas: Pancreas is normal. No ductal dilatation. No pancreatic inflammation. Spleen: Normal spleen Adrenals/urinary tract: Adrenal glands and kidneys are normal. The ureters and bladder normal. Stomach/Bowel: Stomach, small bowel and  cecum are normal. Appendix normal. No bowel obstruction. Again demonstrated soft tissue thickening at the junction of the descending colon proximal sigmoid colon to 42 mm similar to 47 mm on prior (image 61, series 2). No bowel obstruction proximal was. Distal sigmoid colon rectum are normal. Vascular/Lymphatic: Abdominal aorta is normal caliber. There is no retroperitoneal or periportal lymphadenopathy. No pelvic lymphadenopathy. Reproductive: Uterus normal. IUD in expected location. Ovaries normal. Other: No free fluid. Musculoskeletal: No aggressive osseous lesion. IMPRESSION: 1. Overall no significant change in hepatic metastasis. Some lesions are more conspicuous but this may be due to phase of contrast or treatment response. Consider FDG PET scan to more accurately determine efficacy of therapy. 2. Fullness at the junction of the descending colon and proximal sigmoid colon similar to prior. No evidence of bowel obstruction. Electronically Signed   By: Suzy Bouchard M.D.   On: 11/05/2015 16:33    Abdominal CT is reassuring and essentially unchanged from prior.  Dr. Alfred Levins spoke with Dr. Grayland Ormond who agreed that if the patient is tolerating oral intake, not in significant pain, and has a reassuring CT exam, she should be able to follow-up as an outpatient.  I updated the patient with these results and she was very happy at the prospect of being able to go home.  I am proceeding with discharge as per the discharge instructions prepared by Dr. Alfred Levins.   Hinda Kehr, MD 11/05/15 [phone]

## 2015-11-05 NOTE — Discharge Instructions (Signed)
Follow up with your doctor in 1 -2 days. Return if your pain returns, if you develop a fever, nausea and vomiting, or any new symptoms concerning to you.

## 2015-11-05 NOTE — Telephone Encounter (Signed)
Spoke to pt's daughter while they were here today.  Told pt /family that anc @BARCODE2D (Error - No data available.)@ the neulasta injection has not had enough time to kick in yet.  Patient and staff should use neutropenic precautions, wash hands or use hand sanitizer entering and leaving. When tocuhing anything including pt. Again washing hands. Call if temp over 10.4 or greater no matter what time of day it is.    Pt will need atb if she runs fever.  No fresh fruit or veg unless washed or peeled by someone besides the pt. No fresh flowers in room.  Also daughter thinks pt should have pain med more often than once a day and I advised her that I will check on it but it is usually ordered as needed several times a day but pt has to tell staff that she needs one and daughter understands that and I asked .Myriam Jacobson and she said she can have it more often but she only asked for once a day. I am faxing the labs to [facility] [phone]

## 2015-11-05 NOTE — ED Provider Notes (Signed)
[facility] Emergency Department Provider Note  ____________________________________________  Time seen: Approximately 12:34 PM  I have reviewed the triage vital signs and the nursing notes.   HISTORY  Chief Complaint Abdominal Pain   HPI Sandy [name] is a 70 y.o. 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 History is gathered from the daughter. Not a reports that she went to visit her mother this morning she started complaining of right-sided abdominal pain. She had an episode of watery stool and was severely nauseated. Had one episode of vomiting. Her daughter then gave her 1 oxycodone that she is prescribed at home. Patient at this time reports that her abdominal pain is resolved. Patient is unable to provide much history and seems somnolent during my exam. Patient is currently undergoing chemotherapy every 2 weeks with her next treatment on August 1. No fever at home, no urinary symptoms, no chest pain, no shortness of breath.  Past Medical History:  Diagnosis Date  . Cancer ([place])    liver mets; colon cancer  . Constipation   . Diabetes mellitus without complication ([place])   . HOH (hard of hearing)   . Hypertension   . Insomnia   . 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 Sickle cell anemia (HCC)   . Wears dentures    partial upper and lower    Patient Active Problem List   Diagnosis Date Noted  . Neutropenic fever ([place]) 10/18/2015  . Thyroid nodule 09/24/2015  . Personal history of colon cancer   . Thyroid nodule 08/30/2015  . Malignant neoplasm of sigmoid colon ([place]) 08/27/2015  . Hematochezia 06/10/2015  . Neuropathy ([place]) 04/29/2015  . Muscle pain, lumbar 02/12/2015  . Hypomagnesemia 01/15/2015  . Hypokalemia 10/23/2014  . Hyponatremia 10/21/2014  . Colon cancer ([place]) 09/21/2014  . Metastases to the liver ([place]) 09/21/2014    . Colon cancer metastasized to liver ([place]) 09/18/2014  . Cancer related pain 09/18/2014  . Dysuria 09/18/2014  . Anemia 09/18/2014  . Anorexia 09/18/2014  . Weight loss, unintentional 09/18/2014  . Benign essential HTN 09/03/2014  . Type 2 diabetes mellitus ([place]) 09/03/2014  . Personal history of nicotine dependence 09/03/2014  . Combined fat and carbohydrate induced hyperlipemia 09/03/2014  . Chest pain 08/28/2014    Past Surgical History:  Procedure Laterality Date  . CARDIAC CATHETERIZATION    . COLONOSCOPY WITH PROPOFOL N/A 09/07/2014   Procedure: COLONOSCOPY WITH PROPOFOL;  Surgeon: Lucilla Lame, MD;  Location: [facility] ENDOSCOPY;  Service: Endoscopy;  Laterality: N/A;  . ESOPHAGOGASTRODUODENOSCOPY N/A 09/07/2014   Procedure: ESOPHAGOGASTRODUODENOSCOPY (EGD);  Surgeon: Lucilla Lame, MD;  Location: [facility] ENDOSCOPY;  Service: Endoscopy;  Laterality: N/A;  . FLEXIBLE SIGMOIDOSCOPY N/A 09/02/2015   Procedure: FLEXIBLE SIGMOIDOSCOPY;  Surgeon: Lucilla Lame, MD;  Location: [place];  Service: Endoscopy;  Laterality: N/A;  Diabetic - oral meds Pt has port-a-cath  . LIVER BIOPSY    . PORTACATH PLACEMENT Left 09/24/2014   Procedure: INSERTION PORT-A-CATH;  Surgeon: Robert Bellow, MD;  Location: [facility] ORS;  Service: General;  Laterality: Left;    Current Outpatient Rx  . Order #: [id number] Class: Historical Med  . Order #: [id number] Class: Historical Med  . Order #: [id number] Class: Historical Med  . Order #: [id number] Class: Print  . Order #: [id number] Class: Print  . Order #: [id number] Class: Normal  . Order #: [id number] Class: Normal  . Order #:  [id number] Class: Normal  . Order #: [id number] Class: Normal  . Order #: [id number] Class: Historical Med  . Order #: [id number] Class: Historical Med  . Order #: [id number] Class: Normal  . Order #: [id number] Class: Historical Med  . Order #: [id number] Class: Historical Med  . Order #: [id number] Class: Normal  . Order #: [id number] Class: Print  .  Order #: [id number] Class: Print  . Order #: [id number] Class: Normal  . Order #: [id number] Class: Print    Allergies Review of patient's allergies indicates no known allergies.  Family History  Problem Relation Age of Onset  . Cancer Sister     Social History Social History  Substance Use Topics  . Smoking status: Former Smoker    Packs/day: 0.25    Years: 20.00    Types: Cigarettes    Quit date: 09/06/2013  . Smokeless tobacco: Never Used  . Alcohol use No    Review of Systems  Constitutional: Negative for fever. Eyes: Negative for visual changes. ENT: Negative for sore throat. Cardiovascular: Negative for chest pain. Respiratory: Negative for shortness of breath. Gastrointestinal: + R sided abdominal pain, nausea, vomiting, and watery stool. Genitourinary: Negative for dysuria. Musculoskeletal: Negative for back pain. Skin: Negative for rash. Neurological: Negative for headaches, weakness or numbness.  ____________________________________________   PHYSICAL EXAM:  VITAL SIGNS: ED Triage Vitals [11/05/15 1108]  Enc Vitals Group     BP 105/72     Pulse Rate (!) 108     Resp 16     Temp 98 F (36.7 C)     Temp Source Oral     SpO2 99 %     Weight 143 lb (64.9 kg)     Height 5\' 6"  (1.676 m)     Head Circumference      [facility] Flow      Pain Score      Pain Loc      Pain Edu?      Excl. in [place]?     Constitutional: Sleeping, and arouses to voice stimuli, smiles when I'm asking her questions but not really answering questions appropriately which per daughter is patient's baseline HEENT:      Head: Normocephalic and atraumatic.         Eyes: Conjunctivae are normal. Sclera is non-icteric. EOMI. PERRL      Mouth/Throat: Mucous membranes are moist.       Neck: Supple with no signs of meningismus. Cardiovascular: Regular rate and rhythm. No murmurs, gallops, or rubs. 2+ symmetrical distal pulses are present in all extremities. No JVD. Respiratory: Normal  respiratory effort. Lungs are clear to auscultation bilaterally. No wheezes, crackles, or rhonchi.  Gastrointestinal: Soft, non tender, and non distended with positive bowel sounds. No rebound or guarding. Genitourinary: No CVA tenderness. Musculoskeletal: Nontender with normal range of motion in all extremities. No edema, cyanosis, or erythema of extremities. Neurologic: Normal speech and language. Face is symmetric. Moving all extremities. No gross focal neurologic deficits are appreciated. Skin: Skin is warm, dry and intact. No rash noted. Psychiatric: Mood and affect are normal. Speech and behavior are normal.  ____________________________________________   LABS (all labs ordered are listed, but only abnormal results are displayed)  Labs Reviewed  CBC WITH DIFFERENTIAL/PLATELET - Abnormal; Notable for the following:       Result Value   WBC 0.8 (*)    RBC 2.79 (*)    Hemoglobin 8.4 (*)    HCT 24.5 (*)    RDW 16.9 (*)    Neutro Abs 0.2 (*)  Lymphs Abs 0.5 (*)    Monocytes Absolute 0.0 (*)    All other components within normal limits  COMPREHENSIVE METABOLIC PANEL - Abnormal; Notable for the following:    CO2 20 (*)    Glucose, Bld 112 (*)    BUN 26 (*)    Creatinine, Ser 1.39 (*)    Albumin 2.9 (*)    ALT 8 (*)    GFR calc non Af Amer 37 (*)    GFR calc Af Amer 43 (*)    All other components within normal limits  URINALYSIS COMPLETEWITH MICROSCOPIC ([facility] ONLY) - Abnormal; Notable for the following:    Color, Urine YELLOW (*)    APPearance HAZY (*)    Protein, ur 30 (*)    Squamous Epithelial / LPF 0-5 (*)    All other components within normal limits  URINE CULTURE  LACTIC ACID, PLASMA  LIPASE, BLOOD   ____________________________________________  EKG  none ____________________________________________  RADIOLOGY  CT a/p: pending  ____________________________________________   PROCEDURES  Procedure(s) performed: None Critical Care performed:   None ____________________________________________   INITIAL IMPRESSION / ASSESSMENT AND PLAN / ED COURSE  70 y.o. female with history of stage IV colon cancer with liver metastases on chemotherapy, HTN, DM who presents for right-sided abdominal pain, N/V. Patient is unable to provide much history. Due to the fact the patient has metastatic disease to the liver we'll proceed with a CT scan to rule out intra-abdominal hemorrhage or any other complication. We'll check labs including lactic acid.   Clinical Course   _________________________ 3:47 PM on 11/05/2015 -----------------------------------------  Lactic acid within normal limits. Patient with mild AKI, IVF given. Patient is neutropenic as she is undergoing chemotherapy however she has no fever, no pain while in the ED, no nausea or vomiting while here, and serial abdominal exams have remained benign. UA with no evidence of infection. Spoke with Dr. Grayland Ormond, medical oncology who agrees if patient's CT is negative for any acute finding, and patient remians stable, with no pain and tolerating PO that she should be dc to f/u in the clinic this week. Patient has f/u appointment with Dr. Mike Gip on 8/1. CT pending. Care transferred to Dr. Karma Greaser.  Pertinent labs & imaging results that were available during my care of the patient were reviewed by me and considered in my medical decision making (see chart for details).    ____________________________________________   FINAL CLINICAL IMPRESSION(S) / ED DIAGNOSES  Final diagnoses:  Abdominal pain, unspecified abdominal location      NEW MEDICATIONS STARTED DURING THIS VISIT:  New Prescriptions   No medications on file     Note:  This document was prepared using Dragon voice recognition software and may include unintentional dictation errors.    Rudene Re, MD 11/05/15 2159

## 2015-11-06 ENCOUNTER — Emergency Department: Payer: Medicare Other

## 2015-11-06 ENCOUNTER — Encounter: Payer: Self-pay | Admitting: Emergency Medicine

## 2015-11-06 ENCOUNTER — Other Ambulatory Visit: Payer: Self-pay

## 2015-11-06 ENCOUNTER — Emergency Department
Admission: EM | Admit: 2015-11-06 | Discharge: 2015-11-07 | Disposition: A | Discharge status: Home / Self Care | Payer: Medicare Other | Source: Home / Self Care | Attending: Emergency Medicine | Admitting: Emergency Medicine

## 2015-11-06 DIAGNOSIS — E119 Type 2 diabetes mellitus without complications: Secondary | ICD-10-CM

## 2015-11-06 DIAGNOSIS — R531 Weakness: Secondary | ICD-10-CM | POA: Insufficient documentation

## 2015-11-06 DIAGNOSIS — I10 Essential (primary) hypertension: Secondary | ICD-10-CM

## 2015-11-06 DIAGNOSIS — Z85038 Personal history of other malignant neoplasm of large intestine: Secondary | ICD-10-CM

## 2015-11-06 DIAGNOSIS — R4182 Altered mental status, unspecified: Secondary | ICD-10-CM | POA: Insufficient documentation

## 2015-11-06 LAB — CBC WITH DIFFERENTIAL/PLATELET
BASOS PCT: 6 %
Basophils Absolute: 0 10*3/uL (ref 0–0.1)
Eosinophils Absolute: 0 10*3/uL (ref 0–0.7)
Eosinophils Relative: 2 %
HEMATOCRIT: 26.3 % — AB (ref 35.0–47.0)
HEMOGLOBIN: 8.7 g/dL — AB (ref 12.0–16.0)
LYMPHS ABS: 0.4 10*3/uL — AB (ref 1.0–3.6)
LYMPHS PCT: 72 %
MCH: 29.2 pg (ref 26.0–34.0)
MCHC: 33 g/dL (ref 32.0–36.0)
MCV: 88.7 fL (ref 80.0–100.0)
MONOS PCT: 2 %
Monocytes Absolute: 0 10*3/uL — ABNORMAL LOW (ref 0.2–0.9)
NEUTROS ABS: 0.1 10*3/uL — AB (ref 1.4–6.5)
Neutrophils Relative %: 18 %
Platelets: 175 10*3/uL (ref 150–440)
RBC: 2.96 MIL/uL — ABNORMAL LOW (ref 3.80–5.20)
RDW: 16.6 % — AB (ref 11.5–14.5)
WBC: 0.5 10*3/uL — CL (ref 3.6–11.0)

## 2015-11-06 LAB — COMPREHENSIVE METABOLIC PANEL
ALBUMIN: 2.9 g/dL — AB (ref 3.5–5.0)
ALK PHOS: 84 U/L (ref 38–126)
ALT: 8 U/L — ABNORMAL LOW (ref 14–54)
ANION GAP: 12 (ref 5–15)
AST: 13 U/L — AB (ref 15–41)
BILIRUBIN TOTAL: 0.6 mg/dL (ref 0.3–1.2)
BUN: 16 mg/dL (ref 6–20)
CALCIUM: 10.1 mg/dL (ref 8.9–10.3)
CO2: 18 mmol/L — AB (ref 22–32)
Chloride: 110 mmol/L (ref 101–111)
Creatinine, Ser: 1.23 mg/dL — ABNORMAL HIGH (ref 0.44–1.00)
GFR calc Af Amer: 50 mL/min — ABNORMAL LOW (ref >60)
GFR calc non Af Amer: 43 mL/min — ABNORMAL LOW (ref >60)
GLUCOSE: 108 mg/dL — AB (ref 65–99)
POTASSIUM: 4.3 mmol/L (ref 3.5–5.1)
SODIUM: 140 mmol/L (ref 135–145)
Total Protein: 7.1 g/dL (ref 6.5–8.1)

## 2015-11-06 LAB — TROPONIN I: Troponin I: <0.03 ng/mL (ref <0.03)

## 2015-11-06 LAB — URINE CULTURE

## 2015-11-06 MED ORDER — NALOXONE HCL 0.4 MG/ML IJ SOLN
0.2000 mg | Freq: Once | INTRAMUSCULAR | Status: DC
  Filled 2015-11-06: qty 1

## 2015-11-06 MED ORDER — NALOXONE HCL 2 MG/2ML IJ SOSY
PREFILLED_SYRINGE | INTRAMUSCULAR | Status: AC
  Administered 2015-11-06: 0.2 mg
  Filled 2015-11-06: qty 2

## 2015-11-06 NOTE — ED Triage Notes (Signed)
Pt arrived via ems from [facility]. Pt's daughter noticed a decline in pt. Daughter reports incontinence and decreased po intake. Pt is currently being treated for colon cancer.

## 2015-11-06 NOTE — ED Provider Notes (Signed)
[facility] Emergency Department Provider Note  Time seen: 7:11 PM  I have reviewed the triage vital signs and the nursing notes.   HISTORY  Chief Complaint Failure To Thrive    HPI Sandy [name] is a 70 y.o. female the past medical history of stage IV liver cancer on chemotherapy, diabetes, hypertension, presents the emergency department altered metal status. According to family as well as record review the patient was seen in the emergency department yesterday 11/05/15 with complaints of abdominal pain. Patient had a workup largely at baseline with a CT scan showing no acute abnormalities. Patient was discharged home with oncology follow-up 11/12/15. Patient lives at [facility] nursing facility. Since returning home yesterday the patient has had a progressive decline in mental status. Family states normally the patient is awake, alert, talkative. For the past 24 hours they state the patient has been extremely somnolent, will occasionally open eyes to voice, will follow simple commands, but will not speak. Family states this is very abnormal for the patient. They are unaware of any vomiting cough congestion or fever.  Past Medical History:  Diagnosis Date  . Cancer ([place])    liver mets; colon cancer  . Constipation   . Diabetes mellitus without complication ([place])   . HOH (hard of hearing)   . Hypertension   . Insomnia   . 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 Sickle cell anemia (H[facility])   . Wears dentures    partial upper and lower    Patient Active Problem List   Diagnosis Date Noted  . Neutropenic fever ([place]) 10/18/2015  . Thyroid nodule 09/24/2015  . Personal history of colon cancer   . Thyroid nodule 08/30/2015  . Malignant neoplasm of sigmoid colon ([place]) 08/27/2015  . Hematochezia 06/10/2015  . Neuropathy ([place]) 04/29/2015  . Muscle pain, lumbar 02/12/2015  . Hypomagnesemia 01/15/2015  . Hypokalemia 10/23/2014  .  Hyponatremia 10/21/2014  . Colon cancer ([place]) 09/21/2014  . Metastases to the liver ([place]) 09/21/2014  . Colon cancer metastasized to liver ([place]) 09/18/2014  . Cancer related pain 09/18/2014  . Dysuria 09/18/2014  . Anemia 09/18/2014  . Anorexia 09/18/2014  . Weight loss, unintentional 09/18/2014  . Benign essential HTN 09/03/2014  . Type 2 diabetes mellitus ([place]) 09/03/2014  . Personal history of nicotine dependence 09/03/2014  . Combined fat and carbohydrate induced hyperlipemia 09/03/2014  . Chest pain 08/28/2014    Past Surgical History:  Procedure Laterality Date  . CARDIAC CATHETERIZATION    . COLONOSCOPY WITH PROPOFOL N/A 09/07/2014   Procedure: COLONOSCOPY WITH PROPOFOL;  Surgeon: Lucilla Lame, MD;  Location: [facility] ENDOSCOPY;  Service: Endoscopy;  Laterality: N/A;  . ESOPHAGOGASTRODUODENOSCOPY N/A 09/07/2014   Procedure: ESOPHAGOGASTRODUODENOSCOPY (EGD);  Surgeon: Lucilla Lame, MD;  Location: [facility] ENDOSCOPY;  Service: Endoscopy;  Laterality: N/A;  . FLEXIBLE SIGMOIDOSCOPY N/A 09/02/2015   Procedure: FLEXIBLE SIGMOIDOSCOPY;  Surgeon: Lucilla Lame, MD;  Location: [facility];  Service: Endoscopy;  Laterality: N/A;  Diabetic - oral meds Pt has port-a-cath  . LIVER BIOPSY    . PORTACATH PLACEMENT Left 09/24/2014   Procedure: INSERTION PORT-A-CATH;  Surgeon: Robert Bellow, MD;  Location: [facility] ORS;  Service: General;  Laterality: Left;    Prior to Admission medications   Medication Sig Start Date End Date Taking? Authorizing Provider  amLODipine (NORVASC) 5 MG tablet Take 5 mg by mouth daily. Reported on 09/24/2015    Historical Provider, MD  aspirin EC  81 MG tablet Take 81 mg by mouth daily. Reported on 08/26/2015    Historical Provider, MD  atorvastatin (LIPITOR) 40 MG tablet Take 40 mg by mouth daily.    Historical Provider, MD  cefUROXime (CEFTIN) 250 MG tablet Take 1 tablet (250 mg total) by mouth 2 (two) times daily with a meal. 10/22/15   Vaughan Basta, MD   diltiazem (CARDIZEM) 60 MG tablet Take 1 tablet (60 mg total) by mouth every 8 (eight) hours. 10/22/15   Vaughan Basta, MD  docusate sodium (COLACE) 100 MG capsule Take 1 capsule (100 mg total) by mouth 2 (two) times daily. 10/22/15   Vaughan Basta, MD  feeding supplement, ENSURE ENLIVE, (ENSURE ENLIVE) LIQD Take 237 mLs by mouth 2 (two) times daily between meals. 10/22/15   Vaughan Basta, MD  gabapentin (NEURONTIN) 100 MG capsule Take 2 capsules (200 mg total) by mouth at bedtime. 1 tab daily and 2 tabs at bedtime. 07/16/15   Lequita Asal, MD  lidocaine-prilocaine (EMLA) cream Apply 1 application topically as needed. Apply to port 1 hour prior to chemotherapy appointment. Cover with plastic wrap. 07/08/15   Lequita Asal, MD  lisinopril (PRINIVIL,ZESTRIL) 10 MG tablet Take 10 mg by mouth daily.    Historical Provider, MD  magnesium 30 MG tablet Take 30 mg by mouth daily.     Historical Provider, MD  megestrol (MEGACE) 400 MG/10ML suspension Take 5 mLs (200 mg total) by mouth daily. to stimulate appetite 11/06/14   Lequita Asal, MD  metFORMIN (GLUCOPHAGE) 1000 MG tablet Take 1,000 mg by mouth 2 (two) times daily.     Historical Provider, MD  Multiple Vitamin (MULTIVITAMIN WITH MINERALS) TABS tablet Take 1 tablet by mouth daily.    Historical Provider, MD  ondansetron (ZOFRAN) 4 MG tablet Take 1 tablet (4 mg total) by mouth every 8 (eight) hours as needed for nausea or vomiting. 12/13/14   Lequita Asal, MD  oxyCODONE (OXY IR/ROXICODONE) 5 MG immediate release tablet Take 1 tablet (5 mg total) by mouth every 6 (six) hours as needed for severe pain. 10/22/15   Vaughan Basta, MD  oxyCODONE (OXYCONTIN) 10 mg 12 hr tablet Take 1 tablet (10 mg total) by mouth every 12 (twelve) hours. 10/22/15   Vaughan Basta, MD  potassium chloride (K-DUR,KLOR-CON) 10 MEQ tablet Take 1 tablet (10 mEq total) by mouth once. Take 1 pill twice a day for 3 days then 1 pill a  day. 03/11/15   Lequita Asal, MD  traZODone (DESYREL) 50 MG tablet Take 0.5 tablets (25 mg total) by mouth at bedtime as needed for sleep. 10/22/15   Vaughan Basta, MD    No Known Allergies  Family History  Problem Relation Age of Onset  . Cancer Sister     Social History Social History  Substance Use Topics  . Smoking status: Former Smoker    Packs/day: 0.25    Years: 20.00    Types: Cigarettes    Quit date: 09/06/2013  . Smokeless tobacco: Never Used  . Alcohol use No    Review of Systems Patient unable to adequately answer review of systems questioning secondary altered mental status and somnolence. ____________________________________________   PHYSICAL EXAM:  VITAL SIGNS: ED Triage Vitals [11/06/15 1841]  Enc Vitals Group     BP (!) 150/78     Pulse      Resp 18     Temp 98.1 F (36.7 C)     Temp Source Oral  SpO2 99 %     Weight 143 lb (64.9 kg)     Height 5\' 7"  (1.702 m)     Head Circumference      [place] Flow      Pain Score      Pain Loc      Pain Edu?      Excl. in [place]?     Constitutional: Alert, somnolent but will open eyes to voice. Will follow simple commands like squeeze hands, but will not follow complex commands. Will not answer most questions, will shake her head yes or no when asked specifically such as if she is having pain in a particular location. Eyes: Normal exam ENT   Head: Normocephalic and atraumatic.   Mouth/Throat: Dry mucous membranes. Cardiovascular: Normal rate, regular rhythm. No murmur Respiratory: Normal respiratory effort without tachypnea nor retractions. Breath sounds are clear  Gastrointestinal: Soft and nontender. No distention.  Musculoskeletal: Nontender with normal range of motion in all extremities.  Neurologic:  Normal speech and language. No gross focal neurologic deficits are appreciated. Skin:  Skin is warm, dry and intact.  Psychiatric: Mood and affect are normal.   ____________________________________________    EKG  EKG reviewed and interpreted by myself shows sinus rhythm at 97 bpm, narrow QRS, normal axis was a normal intervals, nonspecific ST changes without ST elevation.  ____________________________________________    RADIOLOGY  CT scan shows no acute abnormality Chest x-ray shows no acute abnormality  ____________________________________________   INITIAL IMPRESSION / ASSESSMENT AND PLAN / ED COURSE  Pertinent labs & imaging results that were available during my care of the patient were reviewed by me and considered in my medical decision making (see chart for details).  Patient presents the emergency department with 24 hours of decreased mental status. He states normally the patient is alert and very talkative, but today she has been very somnolent, not willing to get up out of bed.  Patient's CBC largely unchanged from yesterday, remains neutropenic. CT scan of head shows no acute abnormality, chest x-ray shows no acute abnormality. Patient is on pain medication, we will dose a very small dose 0.2 mg of Narcan to see if this helps increase the patient's alertness. Currently the patient does awaken to voice, will briefly answer questions but then falls back asleep.  ____________________________________________   FINAL CLINICAL IMPRESSION(S) / ED DIAGNOSES  Altered mental status Somnolence    Harvest Dark, MD 11/07/15 2358

## 2015-11-06 NOTE — ED Notes (Signed)
Family called this RN, very agitated, wanting to know how much longer this pt's care will be. Family was informed that pt had a CT and an x-ray. Family talked to the Dr. Earlier tonight and states she was told results. Family wants to know how much longer care will be because "my older sister has to go to work and she's been up there since 5:30"

## 2015-11-06 NOTE — Discharge Instructions (Signed)
Please seek medical attention for any high fevers, chest pain, shortness of breath, change in behavior, persistent vomiting, bloody stool or any other new or concerning symptoms.  

## 2015-11-06 NOTE — ED Notes (Signed)
Upon assessment pt seems very lethargic but is easily aroused to voice and nods appropriately.

## 2015-11-07 ENCOUNTER — Inpatient Hospital Stay: Payer: No Typology Code available for payment source

## 2015-11-07 ENCOUNTER — Telehealth: Payer: Self-pay | Admitting: *Deleted

## 2015-11-07 ENCOUNTER — Encounter: Payer: Self-pay | Admitting: Hematology and Oncology

## 2015-11-07 ENCOUNTER — Inpatient Hospital Stay
Admission: AD | Admit: 2015-11-07 | Discharge: 2015-11-12 | DRG: 871 | Disposition: A | Discharge status: Home Health | Payer: Medicare Other | Source: Ambulatory Visit | Attending: Internal Medicine | Admitting: Internal Medicine

## 2015-11-07 ENCOUNTER — Inpatient Hospital Stay: Payer: No Typology Code available for payment source | Admitting: Hematology and Oncology

## 2015-11-07 VITALS — BP 119/76 | HR 80 | Temp 97.8°F | Resp 18 | Wt 143.0 lb

## 2015-11-07 DIAGNOSIS — C787 Secondary malignant neoplasm of liver and intrahepatic bile duct: Secondary | ICD-10-CM | POA: Diagnosis present

## 2015-11-07 DIAGNOSIS — B961 Klebsiella pneumoniae [K. pneumoniae] as the cause of diseases classified elsewhere: Secondary | ICD-10-CM | POA: Diagnosis present

## 2015-11-07 DIAGNOSIS — R680 Hypothermia, not associated with low environmental temperature: Secondary | ICD-10-CM | POA: Diagnosis present

## 2015-11-07 DIAGNOSIS — I4891 Unspecified atrial fibrillation: Secondary | ICD-10-CM | POA: Diagnosis present

## 2015-11-07 DIAGNOSIS — R4182 Altered mental status, unspecified: Secondary | ICD-10-CM | POA: Diagnosis present

## 2015-11-07 DIAGNOSIS — C785 Secondary malignant neoplasm of large intestine and rectum: Secondary | ICD-10-CM | POA: Diagnosis not present

## 2015-11-07 DIAGNOSIS — N179 Acute kidney failure, unspecified: Secondary | ICD-10-CM | POA: Diagnosis present

## 2015-11-07 DIAGNOSIS — D701 Agranulocytosis secondary to cancer chemotherapy: Secondary | ICD-10-CM | POA: Diagnosis present

## 2015-11-07 DIAGNOSIS — Z515 Encounter for palliative care: Secondary | ICD-10-CM | POA: Diagnosis not present

## 2015-11-07 DIAGNOSIS — A4159 Other Gram-negative sepsis: Secondary | ICD-10-CM | POA: Diagnosis present

## 2015-11-07 DIAGNOSIS — I10 Essential (primary) hypertension: Secondary | ICD-10-CM | POA: Diagnosis present

## 2015-11-07 DIAGNOSIS — T50904A Poisoning by unspecified drugs, medicaments and biological substances, undetermined, initial encounter: Secondary | ICD-10-CM

## 2015-11-07 DIAGNOSIS — K921 Melena: Secondary | ICD-10-CM

## 2015-11-07 DIAGNOSIS — C799 Secondary malignant neoplasm of unspecified site: Secondary | ICD-10-CM

## 2015-11-07 DIAGNOSIS — C187 Malignant neoplasm of sigmoid colon: Secondary | ICD-10-CM

## 2015-11-07 DIAGNOSIS — T451X5A Adverse effect of antineoplastic and immunosuppressive drugs, initial encounter: Secondary | ICD-10-CM | POA: Diagnosis present

## 2015-11-07 DIAGNOSIS — R400 Somnolence: Secondary | ICD-10-CM

## 2015-11-07 DIAGNOSIS — D571 Sickle-cell disease without crisis: Secondary | ICD-10-CM | POA: Diagnosis present

## 2015-11-07 DIAGNOSIS — Z7982 Long term (current) use of aspirin: Secondary | ICD-10-CM

## 2015-11-07 DIAGNOSIS — C189 Malignant neoplasm of colon, unspecified: Secondary | ICD-10-CM | POA: Diagnosis present

## 2015-11-07 DIAGNOSIS — Z79818 Long term (current) use of other agents affecting estrogen receptors and estrogen levels: Secondary | ICD-10-CM | POA: Diagnosis not present

## 2015-11-07 DIAGNOSIS — G9341 Metabolic encephalopathy: Secondary | ICD-10-CM | POA: Diagnosis present

## 2015-11-07 DIAGNOSIS — D702 Other drug-induced agranulocytosis: Secondary | ICD-10-CM

## 2015-11-07 DIAGNOSIS — D649 Anemia, unspecified: Secondary | ICD-10-CM

## 2015-11-07 DIAGNOSIS — Z66 Do not resuscitate: Secondary | ICD-10-CM

## 2015-11-07 DIAGNOSIS — Z7984 Long term (current) use of oral hypoglycemic drugs: Secondary | ICD-10-CM

## 2015-11-07 DIAGNOSIS — Z87891 Personal history of nicotine dependence: Secondary | ICD-10-CM | POA: Diagnosis not present

## 2015-11-07 DIAGNOSIS — H9190 Unspecified hearing loss, unspecified ear: Secondary | ICD-10-CM | POA: Diagnosis present

## 2015-11-07 DIAGNOSIS — N390 Urinary tract infection, site not specified: Secondary | ICD-10-CM | POA: Diagnosis present

## 2015-11-07 DIAGNOSIS — R410 Disorientation, unspecified: Secondary | ICD-10-CM | POA: Diagnosis not present

## 2015-11-07 DIAGNOSIS — G629 Polyneuropathy, unspecified: Secondary | ICD-10-CM | POA: Diagnosis present

## 2015-11-07 DIAGNOSIS — D500 Iron deficiency anemia secondary to blood loss (chronic): Secondary | ICD-10-CM

## 2015-11-07 DIAGNOSIS — E119 Type 2 diabetes mellitus without complications: Secondary | ICD-10-CM | POA: Diagnosis present

## 2015-11-07 DIAGNOSIS — J9600 Acute respiratory failure, unspecified whether with hypoxia or hypercapnia: Secondary | ICD-10-CM

## 2015-11-07 DIAGNOSIS — R531 Weakness: Secondary | ICD-10-CM

## 2015-11-07 DIAGNOSIS — R6520 Severe sepsis without septic shock: Secondary | ICD-10-CM | POA: Diagnosis present

## 2015-11-07 DIAGNOSIS — D709 Neutropenia, unspecified: Secondary | ICD-10-CM | POA: Diagnosis not present

## 2015-11-07 DIAGNOSIS — N289 Disorder of kidney and ureter, unspecified: Secondary | ICD-10-CM

## 2015-11-07 DIAGNOSIS — D61810 Antineoplastic chemotherapy induced pancytopenia: Secondary | ICD-10-CM

## 2015-11-07 DIAGNOSIS — J150 Pneumonia due to Klebsiella pneumoniae: Secondary | ICD-10-CM | POA: Diagnosis not present

## 2015-11-07 DIAGNOSIS — E860 Dehydration: Secondary | ICD-10-CM | POA: Diagnosis present

## 2015-11-07 DIAGNOSIS — R404 Transient alteration of awareness: Secondary | ICD-10-CM

## 2015-11-07 DIAGNOSIS — A419 Sepsis, unspecified organism: Secondary | ICD-10-CM | POA: Diagnosis not present

## 2015-11-07 LAB — CBC WITH DIFFERENTIAL/PLATELET
Basophils Absolute: 0 10*3/uL (ref 0–0.1)
Basophils Relative: 0 %
Eosinophils Absolute: 0 10*3/uL (ref 0–0.7)
Eosinophils Relative: 2 %
HCT: 22.5 % — ABNORMAL LOW (ref 35.0–47.0)
Hemoglobin: 7.6 g/dL — ABNORMAL LOW (ref 12.0–16.0)
Lymphocytes Relative: 83 %
Lymphs Abs: 0.3 10*3/uL — ABNORMAL LOW (ref 1.0–3.6)
MCH: 29.3 pg (ref 26.0–34.0)
MCHC: 33.7 g/dL (ref 32.0–36.0)
MCV: 86.8 fL (ref 80.0–100.0)
Monocytes Absolute: 0 10*3/uL — ABNORMAL LOW (ref 0.2–0.9)
Monocytes Relative: 3 %
Neutro Abs: 0 10*3/uL — ABNORMAL LOW (ref 1.4–6.5)
Neutrophils Relative %: 12 %
Platelets: 129 10*3/uL — ABNORMAL LOW (ref 150–440)
RBC: 2.59 MIL/uL — ABNORMAL LOW (ref 3.80–5.20)
RDW: 17.1 % — ABNORMAL HIGH (ref 11.5–14.5)
WBC: 0.3 10*3/uL — CL (ref 3.6–11.0)

## 2015-11-07 LAB — COMPREHENSIVE METABOLIC PANEL
ALT: 9 U/L — ABNORMAL LOW (ref 14–54)
AST: 17 U/L (ref 15–41)
Albumin: 2.9 g/dL — ABNORMAL LOW (ref 3.5–5.0)
Alkaline Phosphatase: 97 U/L (ref 38–126)
Anion gap: 9 (ref 5–15)
BUN: 19 mg/dL (ref 6–20)
CO2: 19 mmol/L — ABNORMAL LOW (ref 22–32)
Calcium: 9.5 mg/dL (ref 8.9–10.3)
Chloride: 109 mmol/L (ref 101–111)
Creatinine, Ser: 1.43 mg/dL — ABNORMAL HIGH (ref 0.44–1.00)
GFR calc Af Amer: 42 mL/min — ABNORMAL LOW (ref >60)
GFR calc non Af Amer: 36 mL/min — ABNORMAL LOW (ref >60)
Glucose, Bld: 135 mg/dL — ABNORMAL HIGH (ref 65–99)
Potassium: 4.9 mmol/L (ref 3.5–5.1)
Sodium: 137 mmol/L (ref 135–145)
Total Bilirubin: 0.7 mg/dL (ref 0.3–1.2)
Total Protein: 7.3 g/dL (ref 6.5–8.1)

## 2015-11-07 LAB — AMMONIA: Ammonia: 11 umol/L (ref 9–35)

## 2015-11-07 LAB — SAMPLE TO BLOOD BANK

## 2015-11-07 MED ORDER — DEXTROSE 5 % IV SOLN
2.0000 g | Freq: Two times a day (BID) | INTRAVENOUS | Status: DC
  Administered 2015-11-07 – 2015-11-09 (×4): 2 g via INTRAVENOUS
  Filled 2015-11-07 (×8): qty 2

## 2015-11-07 MED ORDER — HEPARIN SODIUM (PORCINE) 5000 UNIT/ML IJ SOLN
5000.0000 [IU] | Freq: Three times a day (TID) | INTRAMUSCULAR | Status: DC
Start: 2015-11-07 — End: 2015-11-08
  Administered 2015-11-07 – 2015-11-08 (×2): 5000 [IU] via SUBCUTANEOUS
  Filled 2015-11-07 (×2): qty 1

## 2015-11-07 MED ORDER — ASPIRIN EC 81 MG PO TBEC
81.0000 mg | DELAYED_RELEASE_TABLET | Freq: Every day | ORAL | Status: DC
  Administered 2015-11-09 – 2015-11-12 (×4): 81 mg via ORAL
  Filled 2015-11-07 (×4): qty 1

## 2015-11-07 MED ORDER — ATORVASTATIN CALCIUM 20 MG PO TABS
40.0000 mg | ORAL_TABLET | Freq: Every day | ORAL | Status: DC
Start: 2015-11-07 — End: 2015-11-12
  Administered 2015-11-09 – 2015-11-12 (×4): 40 mg via ORAL
  Filled 2015-11-07 (×4): qty 2

## 2015-11-07 MED ORDER — ENSURE ENLIVE PO LIQD
237.0000 mL | Freq: Two times a day (BID) | ORAL | Status: DC
  Administered 2015-11-10 – 2015-11-12 (×4): 237 mL via ORAL

## 2015-11-07 MED ORDER — VANCOMYCIN HCL IN DEXTROSE 750-5 MG/150ML-% IV SOLN
750.0000 mg | INTRAVENOUS | Status: DC
  Administered 2015-11-08: 750 mg via INTRAVENOUS
  Filled 2015-11-07 (×2): qty 150

## 2015-11-07 MED ORDER — SODIUM CHLORIDE 0.9 % IV SOLN
INTRAVENOUS | Status: DC
  Administered 2015-11-07 – 2015-11-09 (×3): via INTRAVENOUS

## 2015-11-07 MED ORDER — VANCOMYCIN HCL IN DEXTROSE 750-5 MG/150ML-% IV SOLN
750.0000 mg | INTRAVENOUS | Status: DC
  Filled 2015-11-07: qty 150

## 2015-11-07 MED ORDER — OXYCODONE HCL 5 MG PO TABS
5.0000 mg | ORAL_TABLET | Freq: Four times a day (QID) | ORAL | Status: DC | PRN
  Administered 2015-11-10 (×2): 5 mg via ORAL
  Filled 2015-11-07 (×2): qty 1

## 2015-11-07 MED ORDER — VANCOMYCIN HCL IN DEXTROSE 750-5 MG/150ML-% IV SOLN
750.0000 mg | Freq: Once | INTRAVENOUS | Status: AC
  Administered 2015-11-07: 750 mg via INTRAVENOUS
  Filled 2015-11-07: qty 150

## 2015-11-07 NOTE — H&P (Signed)
Sandy [name] NAME: Sandy [name]    MR#:  [id number]  DATE OF BIRTH:  [birth year]  DATE OF ADMISSION:  11/07/2015  PRIMARY CARE PHYSICIAN: [name], MD   REQUESTING/REFERRING PHYSICIAN: Dr. Osvaldo Shipper  CHIEF COMPLAINT:  Altered mental status  HISTORY OF PRESENT ILLNESS: Sandy [name]  is a 70 y.o. female with a known history of Stage IV colon cancer on chemotherapy, last chemotherapy 10 days ago . Recent admission for neutropenic fever.  She was seen in the ER on 11/05/2015 with right sided abdominal pain.   Abdominal and pelvic CT scan revealed no significant change in hepatic metastasis.  There was fullness at the junction of the descending colon and proximal sigmoid colon similar to prior scans.  Labs included a hematocrit of 24.5, hemoglobin 8.4, platelets 243,000, WBC 800 with an ANC of 300.  She received IVF and was discharged back to [facility].  She was seen in the ER on 11/06/2015 with altered mental status (somnolence).  Head CT was negative.  CXR was negative.  Labs included a creatinine of 1.23.  CBC revealed a hematocrit of 26.3, hemoglobin 8.7, WBC was 500 with an ANC of 100.  Her family brought her home.  Her family [facility] today secondary to 2 episodes of melanotic stool.  She has been non-verbal (only said "no" once today).  She has had very little to eat or drink.  Her daughter was able to get her AM medications in.  She has had no fever, nausea or vomiting.  She does not complain of pain.  As pt was drowsy and dehydrated, Dr. Osvaldo Shipper suggested for direct admission.  PAST MEDICAL HISTORY:   Past Medical History:  Diagnosis Date  . Cancer ([place])    liver mets; colon cancer  . Constipation   . Diabetes mellitus without complication ([place])   . HOH (hard of hearing)   . Hypertension   . Insomnia   . 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 Sickle cell anemia ([facility])   .  Wears dentures    partial upper and lower    PAST SURGICAL HISTORY: Past Surgical History:  Procedure Laterality Date  . CARDIAC CATHETERIZATION    . COLONOSCOPY WITH PROPOFOL N/A 09/07/2014   Procedure: COLONOSCOPY WITH PROPOFOL;  Surgeon: Lucilla Lame, MD;  Location: [facility] ENDOSCOPY;  Service: Endoscopy;  Laterality: N/A;  . ESOPHAGOGASTRODUODENOSCOPY N/A 09/07/2014   Procedure: ESOPHAGOGASTRODUODENOSCOPY (EGD);  Surgeon: Lucilla Lame, MD;  Location: [facility] ENDOSCOPY;  Service: Endoscopy;  Laterality: N/A;  . FLEXIBLE SIGMOIDOSCOPY N/A 09/02/2015   Procedure: FLEXIBLE SIGMOIDOSCOPY;  Surgeon: Lucilla Lame, MD;  Location: [place];  Service: Endoscopy;  Laterality: N/A;  Diabetic - oral meds Pt has port-a-cath  . LIVER BIOPSY    . PORTACATH PLACEMENT Left 09/24/2014   Procedure: INSERTION PORT-A-CATH;  Surgeon: Robert Bellow, MD;  Location: [facility] ORS;  Service: General;  Laterality: Left;    SOCIAL HISTORY:  Social History  Substance Use Topics  . Smoking status: Former Smoker    Packs/day: 0.25    Years: 20.00    Types: Cigarettes    Quit date: 09/06/2013  . Smokeless tobacco: Never Used  . Alcohol use No    FAMILY HISTORY:  Family History  Problem Relation Age of Onset  . Cancer Sister     DRUG ALLERGIES: No Known Allergies  REVIEW OF SYSTEMS:   Pt is  drowsy- not able to give ROS.  MEDICATIONS AT HOME:  Prior to Admission medications   Medication Sig Start Date End Date Taking? Authorizing Provider  amLODipine (NORVASC) 5 MG tablet Take 5 mg by mouth daily. Reported on 09/24/2015    Historical Provider, MD  aspirin EC 81 MG tablet Take 81 mg by mouth daily. Reported on 08/26/2015    Historical Provider, MD  atorvastatin (LIPITOR) 40 MG tablet Take 40 mg by mouth daily.    Historical Provider, MD  cefUROXime (CEFTIN) 250 MG tablet Take 1 tablet (250 mg total) by mouth 2 (two) times daily with a meal. 10/22/15   Vaughan Basta, MD  diltiazem (CARDIZEM) 60 MG  tablet Take 1 tablet (60 mg total) by mouth every 8 (eight) hours. 10/22/15   Vaughan Basta, MD  docusate sodium (COLACE) 100 MG capsule Take 1 capsule (100 mg total) by mouth 2 (two) times daily. 10/22/15   Vaughan Basta, MD  feeding supplement, ENSURE ENLIVE, (ENSURE ENLIVE) LIQD Take 237 mLs by mouth 2 (two) times daily between meals. 10/22/15   Vaughan Basta, MD  gabapentin (NEURONTIN) 100 MG capsule Take 2 capsules (200 mg total) by mouth at bedtime. 1 tab daily and 2 tabs at bedtime. 07/16/15   Lequita Asal, MD  lidocaine-prilocaine (EMLA) cream Apply 1 application topically as needed. Apply to port 1 hour prior to chemotherapy appointment. Cover with plastic wrap. 07/08/15   Lequita Asal, MD  lisinopril (PRINIVIL,ZESTRIL) 10 MG tablet Take 10 mg by mouth daily.    Historical Provider, MD  magnesium 30 MG tablet Take 30 mg by mouth daily.     Historical Provider, MD  megestrol (MEGACE) 400 MG/10ML suspension Take 5 mLs (200 mg total) by mouth daily. to stimulate appetite 11/06/14   Lequita Asal, MD  metFORMIN (GLUCOPHAGE) 1000 MG tablet Take 1,000 mg by mouth 2 (two) times daily.     Historical Provider, MD  Multiple Vitamin (MULTIVITAMIN WITH MINERALS) TABS tablet Take 1 tablet by mouth daily.    Historical Provider, MD  ondansetron (ZOFRAN) 4 MG tablet Take 1 tablet (4 mg total) by mouth every 8 (eight) hours as needed for nausea or vomiting. 12/13/14   Lequita Asal, MD  oxyCODONE (OXY IR/ROXICODONE) 5 MG immediate release tablet Take 1 tablet (5 mg total) by mouth every 6 (six) hours as needed for severe pain. 10/22/15   Vaughan Basta, MD  traZODone (DESYREL) 50 MG tablet Take 0.5 tablets (25 mg total) by mouth at bedtime as needed for sleep. 10/22/15   Vaughan Basta, MD      PHYSICAL EXAMINATION:   VITAL SIGNS: Blood pressure 120/65, pulse 72, temperature 98.5 F (36.9 C), temperature source Axillary, resp. rate 13, height 5\' 6"  (1.676  m), weight 61 kg (134 lb 7.7 oz), SpO2 98 %.  GENERAL:  70 y.o.-year-old patient lying in the bed with no acute distress.  EYES: Pupils equal, round, reactive to light. No scleral icterus.   HEENT: Head atraumatic, normocephalic. Oropharynx and nasopharynx clear. Hair loss. NECK:  Supple, no jugular venous distention. No thyroid enlargement, no tenderness.  LUNGS: Normal breath sounds bilaterally, no wheezing, rales,rhonchi or crepitation. No use of accessory muscles of respiration.  CARDIOVASCULAR: S1, S2 normal. No murmurs, rubs, or gallops.  ABDOMEN: Soft, nontender, nondistended. Bowel sounds present. No organomegaly or mass.  EXTREMITIES: No pedal edema, cyanosis, or clubbing.  NEUROLOGIC: Pt is drowsy, Opens eyes to stimuli, and goes back to sleep. She moves limbs slowly to  stimuli. PSYCHIATRIC: The patient is drowsy. SKIN: No obvious rash, lesion, or ulcer.   LABORATORY PANEL:   CBC  Recent Labs Lab 11/05/15 1007 11/05/15 1257 11/06/15 1924 11/07/15 1156  WBC 1.1* 0.8* 0.5* 0.3*  HGB 9.0* 8.4* 8.7* 7.6*  HCT 27.0* 24.5* 26.3* 22.5*  PLT 296 243 175 129*  MCV 87.4 87.7 88.7 86.8  MCH 29.2 29.9 29.2 29.3  MCHC 33.4 34.1 33.0 33.7  RDW 17.1* 16.9* 16.6* 17.1*  LYMPHSABS 0.8* 0.5* 0.4* 0.3*  MONOABS 0.0* 0.0* 0.0* 0.0*  EOSABS 0.0 0.0 0.0 0.0  BASOSABS 0.0 0.0 0.0 0.0   ------------------------------------------------------------------------------------------------------------------  Chemistries   Recent Labs Lab 11/05/15 1257 11/06/15 1924 11/07/15 1325  NA 138 140 137  K 4.7 4.3 4.9  CL 108 110 109  CO2 20* 18* 19*  GLUCOSE 112* 108* 135*  BUN 26* 16 19  CREATININE 1.39* 1.23* 1.43*  CALCIUM 9.6 10.1 9.5  AST 16 13* 17  ALT 8* 8* 9*  ALKPHOS 88 84 97  BILITOT 0.6 0.6 0.7   ------------------------------------------------------------------------------------------------------------------ estimated creatinine clearance is 33.8 mL/min (by C-G formula  based on SCr of 1.43 mg/dL). ------------------------------------------------------------------------------------------------------------------ No results for input(s): TSH, T4TOTAL, T3FREE, THYROIDAB in the last 72 hours.  Invalid input(s): FREET3   Coagulation profile No results for input(s): INR, PROTIME in the last 168 hours. ------------------------------------------------------------------------------------------------------------------- No results for input(s): DDIMER in the last 72 hours. -------------------------------------------------------------------------------------------------------------------  Cardiac Enzymes  Recent Labs Lab 11/06/15 1924  TROPONINI <0.03   ------------------------------------------------------------------------------------------------------------------ Invalid input(s): POCBNP  ---------------------------------------------------------------------------------------------------------------  Urinalysis    Component Value Date/Time   COLORURINE YELLOW (A) 11/05/2015 1258   APPEARANCEUR HAZY (A) 11/05/2015 1258   APPEARANCEUR Clear 09/23/2011 1357   LABSPEC 1.013 11/05/2015 1258   LABSPEC 1.003 09/23/2011 1357   PHURINE 5.0 11/05/2015 1258   GLUCOSEU NEGATIVE 11/05/2015 1258   GLUCOSEU Negative 09/23/2011 1357   HGBUR NEGATIVE 11/05/2015 1258   BILIRUBINUR NEGATIVE 11/05/2015 1258   BILIRUBINUR Negative 09/23/2011 1357   KETONESUR NEGATIVE 11/05/2015 1258   PROTEINUR 30 (A) 11/05/2015 1258   NITRITE NEGATIVE 11/05/2015 1258   LEUKOCYTESUR NEGATIVE 11/05/2015 1258   LEUKOCYTESUR Trace 09/23/2011 1357     RADIOLOGY: Ct Head Wo Contrast  Result Date: 11/06/2015 CLINICAL DATA:  Altered mental status. Patient is currently being treated for colon cancer. EXAM: CT HEAD WITHOUT CONTRAST TECHNIQUE: Contiguous axial images were obtained from the base of the skull through the vertex without intravenous contrast. COMPARISON:  10/18/2015 FINDINGS:  Brain: Mild atrophy with sulcal prominence and centralized volume loss with mild commensurate ex vacuo dilatation of the ventricular system, asymmetrically involving the anterior horns of the bilateral lateral ventricles. Minimal periventricular hypodensities compatible microvascular ischemic disease. Right basilar ganglial calcifications. The gray-white differentiation is otherwise well maintained without CT evidence of acute large territory infarct. No intraparenchymal or extra-axial mass or hemorrhage. Normal configuration of the ventricles and basilar cisterns. No midline shift. Vascular: Intracranial atherosclerosis Skull: No displaced calvarial fracture. Sinuses/Orbits: Limited visualization of the paranasal sinuses and mastoid air cells is normal. No air-fluid levels. Other: Regional soft tissues appear normal. IMPRESSION: Mild atrophy and microvascular ischemic disease without acute intracranial process. Electronically Signed   By: Sandi Mariscal M.D.   On: 11/06/2015 19:53  Dg Chest Portable 1 View  Result Date: 11/06/2015 CLINICAL DATA:  Lethargy and shallow breathing today. History of colon cancer. EXAM: PORTABLE CHEST 1 VIEW COMPARISON:  Single-view of the chest 10/18/2015. FINDINGS: Left subclavian approach Port-A-Cath is unchanged. Lungs are clear. Heart  size is upper normal. No pneumothorax or pleural effusion. IMPRESSION: No acute disease. Electronically Signed   By: Inge Rise M.D.   On: 11/06/2015 19:34   EKG: Orders placed or performed in visit on 11/06/15  . EKG 12-Lead  . EKG 12-Lead  . EKG 12-Lead    IMPRESSION AND PLAN:  * Altered mental status   Metabolic encephalopathy? Dehydration Vs Ammonia? Brain Mets?   Check Ammonia, MRI brain.   IV fluids.   Vital stable.  * Neutropenia   Post chemo   She was given neulasta 11/01/15 as per oncology notes.   Will give vanc+ cefepime as she may have inection as a reason of AMS.   Blood cx sent  * Diabetes mellitus type 2    decreased intake due to AMS, monitor with SCC.  * Metastatic colon cancer getting chemotherapy   Presented with the neutropenia, chemotherapy was last week.   Oncology consult.    Palliative care consult.   * Essential hypertension   Controlled, hold oral meds for now.  * Ac renal insufficiency   Due to dehydration   IV fluid and monitor.  All the records are reviewed and case discussed with ED provider. Management plans discussed with the patient, family and they are in agreement.  CODE STATUS: Full code. Code Status History    Date Active Date Inactive Code Status Order ID Comments User Context   08/28/2014  4:37 PM 08/29/2014  8:42 PM Full Code [id number]  Theodoro Grist, MD Inpatient    Advance Directive Documentation   Flowsheet Row Most Recent Value  Type of Advance Directive  Healthcare Power of Attorney  Pre-existing out of facility DNR order (yellow form or pink MOST form)  No data  "MOST" Form in Place?  No data     I had long discussion with her daughter on phone, encouraged her about addressing goal of care.  TOTAL TIME TAKING CARE OF THIS PATIENT: 50 minutes.    Vaughan Basta M.D on 11/07/2015   Between 7am to 6pm - Pager - [phone]  After 6pm go to [email or web address] - password [email or web address] [facility]  Office  [phone]  CC: Primary care physician; [name], MD   Note: This dictation was prepared with Dragon dictation along with smaller phrase technology. Any transcriptional errors that result from this process are unintentional.

## 2015-11-07 NOTE — Progress Notes (Signed)
[facility] day:  11/07/15   Chief Complaint: Sandy [name] is a 70 y.o. female with stage IV colon cancer who is seen for sick call visit on day 10 s/p cycle #2 FOLFIRI chemotherapy.  HPI: The patient was last seen in the medical oncology clinic on 10/29/2015.  At that time, she was seen received cycle #2 FOLFIRI.  She received Neulasta on 11/01/2015.    She was seen in the ER on 11/05/2015 with right sided abdominal pain.   Abdominal and pelvic CT scan revealed no significant change in hepatic metastasis.  There was fullness at the junction of the descending colon and proximal sigmoid colon similar to prior scans.  Labs included a hematocrit of 24.5, hemoglobin 8.4, platelets 243,000, WBC 800 with an ANC of 300.  She received IVF and was discharged back to [facility].  She was seen in the ER on 11/06/2015 with altered mental status (somnolence).  Head CT was negative.  CXR was negative.  Labs included a creatinine of 1.23.  CBC revealed a hematocrit of 26.3, hemoglobin 8.7, WBC was 500 with an ANC of 100.  Her family brought her home.  Her family contacted the clinic today secondary to 2 episodes of melanotic stool.  She has been non-verbal (only said "no" once today).  She has had very little to eat or drink.  Her daughter was able to get her AM medications in.  She has had no fever, nausea or vomiting.  She does not complain of pain.   Past Medical History:  Diagnosis Date  . Cancer ([place])    liver mets; colon cancer  . Constipation   . Diabetes mellitus without complication ([place])   . HOH (hard of hearing)   . Hypertension   . Insomnia   . 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 Sickle cell anemia (H[facility])   . Wears dentures    partial upper and lower    Past Surgical History:  Procedure Laterality Date  . CARDIAC CATHETERIZATION    . COLONOSCOPY WITH PROPOFOL N/A 09/07/2014   Procedure: COLONOSCOPY WITH PROPOFOL;   Surgeon: Lucilla Lame, MD;  Location: [facility] ENDOSCOPY;  Service: Endoscopy;  Laterality: N/A;  . ESOPHAGOGASTRODUODENOSCOPY N/A 09/07/2014   Procedure: ESOPHAGOGASTRODUODENOSCOPY (EGD);  Surgeon: Lucilla Lame, MD;  Location: [facility] ENDOSCOPY;  Service: Endoscopy;  Laterality: N/A;  . FLEXIBLE SIGMOIDOSCOPY N/A 09/02/2015   Procedure: FLEXIBLE SIGMOIDOSCOPY;  Surgeon: Lucilla Lame, MD;  Location: [facility];  Service: Endoscopy;  Laterality: N/A;  Diabetic - oral meds Pt has port-a-cath  . LIVER BIOPSY    . PORTACATH PLACEMENT Left 09/24/2014   Procedure: INSERTION PORT-A-CATH;  Surgeon: Robert Bellow, MD;  Location: [facility] ORS;  Service: General;  Laterality: Left;    Family History  Problem Relation Age of Onset  . Cancer Sister     Social History:  reports that she quit smoking about 2 years ago. Her smoking use included Cigarettes. She has a 5.00 pack-year smoking history. She has never used smokeless tobacco. She reports that she does not drink alcohol or use drugs.  The patient has recently been a resident of [facility].  The patient is accompanied by her daughter, [name], an her son.  The patient's medical power of attorney is her daughter.   Allergies: No Known Allergies  Current Medications: Current Outpatient Prescriptions  Medication Sig Dispense Refill  . amLODipine (NORVASC) 5 MG tablet  Take 5 mg by mouth daily. Reported on 09/24/2015    . aspirin EC 81 MG tablet Take 81 mg by mouth daily. Reported on 08/26/2015    . atorvastatin (LIPITOR) 40 MG tablet Take 40 mg by mouth daily.    . cefUROXime (CEFTIN) 250 MG tablet Take 1 tablet (250 mg total) by mouth 2 (two) times daily with a meal. 6 tablet 0  . diltiazem (CARDIZEM) 60 MG tablet Take 1 tablet (60 mg total) by mouth every 8 (eight) hours. 90 tablet 0  . docusate sodium (COLACE) 100 MG capsule Take 1 capsule (100 mg total) by mouth 2 (two) times daily. 10 capsule 0  . feeding supplement, ENSURE ENLIVE, (ENSURE  ENLIVE) LIQD Take 237 mLs by mouth 2 (two) times daily between meals. 237 mL 12  . gabapentin (NEURONTIN) 100 MG capsule Take 2 capsules (200 mg total) by mouth at bedtime. 1 tab daily and 2 tabs at bedtime. 60 capsule 0  . lidocaine-prilocaine (EMLA) cream Apply 1 application topically as needed. Apply to port 1 hour prior to chemotherapy appointment. Cover with plastic wrap. 30 g 1  . lisinopril (PRINIVIL,ZESTRIL) 10 MG tablet Take 10 mg by mouth daily.    . magnesium 30 MG tablet Take 30 mg by mouth daily.     . megestrol (MEGACE) 400 MG/10ML suspension Take 5 mLs (200 mg total) by mouth daily. to stimulate appetite 240 mL 0  . metFORMIN (GLUCOPHAGE) 1000 MG tablet Take 1,000 mg by mouth 2 (two) times daily.     . Multiple Vitamin (MULTIVITAMIN WITH MINERALS) TABS tablet Take 1 tablet by mouth daily.    . ondansetron (ZOFRAN) 4 MG tablet Take 1 tablet (4 mg total) by mouth every 8 (eight) hours as needed for nausea or vomiting. 30 tablet 2  . oxyCODONE (OXY IR/ROXICODONE) 5 MG immediate release tablet Take 1 tablet (5 mg total) by mouth every 6 (six) hours as needed for severe pain. 20 tablet 0  . traZODone (DESYREL) 50 MG tablet Take 0.5 tablets (25 mg total) by mouth at bedtime as needed for sleep. 20 tablet 0   No current facility-administered medications for this visit.    Facility-Administered Medications Ordered in Other Visits  Medication Dose Route Frequency Provider Last Rate Last Dose  . acetaminophen (TYLENOL) tablet 650 mg  650 mg Oral Once Lequita Asal, MD      . sodium chloride 0.9 % injection 10 mL  10 mL Intracatheter PRN Lequita Asal, MD   10 mL at 10/11/14 1431  . sodium chloride 0.9 % injection 10 mL  10 mL Intracatheter PRN Lequita Asal, MD   10 mL at 02/28/15 1529    Review of Systems:  GENERAL: No complaints (patient non-verbal).  No fevers.  Weight down 4 pounds. PERFORMANCE STATUS (ECOG): 2. HEENT: Wears a hearing aide. No complaints of sore  throat, mouth sores or tenderness. Lungs: No apparent shortness of breath .  No cough. No hemoptysis. Cardiac: No apparent chest pain, or palpitations. GI:  Poor oral intake (minimal past 1-2 days).  Black loose stool.  No nausea, vomiting, constipation,or hematochezia. GU: Wears a Depends pad.  Unable to assess dysuria or hematuria. Musculoskeletal: No apparent back pain or muscle tenderness. Extremities: No pain or swelling. Skin: No rashes, skin lesions, or ulcers Neuro:  Somnolent.  Spoke one word :no" today.  General weakness.  No apparent headache, focal weakness, or balance issues. Endocrine: Diabetes.  No thyroid issues. Psych: No apparent  mood changes. Pain: Oxycodone prn (none recently). Review of systems: All other systems reviewed and found to be negative.  Physical Exam: Blood pressure 119/76, pulse 80, temperature 97.8 F (36.6 C), temperature source Tympanic, resp. rate 18, weight 143 lb (64.9 kg). GENERAL: Elderly woman sitting in a wheelchair in the exam room, non-communicative. MENTAL STATUS: Alert and oriented to person, place and time. HEAD: Alopecia totalis. Normocephalic, atraumatic, face symmetric, no Cushingoid features. EYES: Eyes closed tight.  Hazel/green eyes. Pupils equal round and reactive to light and accomodation. No conjunctivitis or scleral icterus. ENT: Clenching teeth.  Unable to visualize mouth. RESPIRATORY: Clear to auscultation without rales, wheezes or rhonchi. CARDIOVASCULAR: Regular rate and rhythm without murmur, rub or gallop. ABDOMEN: Soft, non-tender with active bowel sounds. No guarding or rebound tenderness. No hepatosplenomegaly. SKIN: No rashes, ulcer or skin lesions.  EXTREMITIES: No edema, no skin discoloration or tenderness. No palpable cords. LYMPH NODES: No palpable supraclavicular, axillary or inguinal adenopathy  NEUROLOGICAL:  Somnolent.  Does not follow commands.  Moans.   Clinical Support on 11/07/2015   Component Date Value Ref Range Status  . WBC 11/07/2015 0.3* 3.6 - 11.0 K/uL Final   Comment: RESULT REPEATED AND VERIFIED CANCER CENTER CRITICAL VALUE PROTOCOL   . RBC 11/07/2015 2.59* 3.80 - 5.20 MIL/uL Final  . Hemoglobin 11/07/2015 7.6* 12.0 - 16.0 g/dL Final  . HCT 11/07/2015 22.5* 35.0 - 47.0 % Final  . MCV 11/07/2015 86.8  80.0 - 100.0 fL Final  . MCH 11/07/2015 29.3  26.0 - 34.0 pg Final  . MCHC 11/07/2015 33.7  32.0 - 36.0 g/dL Final  . RDW 11/07/2015 17.1* 11.5 - 14.5 % Final  . Platelets 11/07/2015 129* 150 - 440 K/uL Final  . Neutrophils Relative % 11/07/2015 12%  % Final  . Neutro Abs 11/07/2015 0.0* 1.4 - 6.5 K/uL Final   Comment: RESULT REPEATED AND VERIFIED CRITICAL RESULT CALLED TO, READ BACK BY AND VERIFIED WITH: [facility] TO DR Mike Gip 1215 11/07/15   . Lymphocytes Relative 11/07/2015 83%  % Final  . Lymphs Abs 11/07/2015 0.3* 1.0 - 3.6 K/uL Final  . Monocytes Relative 11/07/2015 3%  % Final  . Monocytes Absolute 11/07/2015 0.0* 0.2 - 0.9 K/uL Final  . Eosinophils Relative 11/07/2015 2%  % Final  . Eosinophils Absolute 11/07/2015 0.0  0 - 0.7 K/uL Final  . Basophils Relative 11/07/2015 0%  % Final  . Basophils Absolute 11/07/2015 0.0  0 - 0.1 K/uL Final  . Blood Bank Specimen 11/07/2015 SAMPLE AVAILABLE FOR TESTING   Final  . Sample Expiration 11/07/2015 11/10/2015   Final  Admission on 11/06/2015, Discharged on 11/07/2015  Component Date Value Ref Range Status  . Sodium 11/06/2015 140  135 - 145 mmol/L Final  . Potassium 11/06/2015 4.3  3.5 - 5.1 mmol/L Final  . Chloride 11/06/2015 110  101 - 111 mmol/L Final  . CO2 11/06/2015 18* 22 - 32 mmol/L Final  . Glucose, Bld 11/06/2015 108* 65 - 99 mg/dL Final  . BUN 11/06/2015 16  6 - 20 mg/dL Final  . Creatinine, Ser 11/06/2015 1.23* 0.44 - 1.00 mg/dL Final  . Calcium 11/06/2015 10.1  8.9 - 10.3 mg/dL Final  . Total Protein 11/06/2015 7.1  6.5 - 8.1 g/dL Final  . Albumin 11/06/2015 2.9* 3.5 - 5.0 g/dL Final  . AST  11/06/2015 13* 15 - 41 U/L Final  . ALT 11/06/2015 8* 14 - 54 U/L Final  . Alkaline Phosphatase 11/06/2015 84  38 - 126 U/L  Final  . Total Bilirubin 11/06/2015 0.6  0.3 - 1.2 mg/dL Final  . GFR calc non Af Amer 11/06/2015 43* >60 mL/min Final  . GFR calc Af Amer 11/06/2015 50* >60 mL/min Final   Comment: (NOTE) The eGFR has been calculated using the CKD EPI equation. This calculation has not been validated in all clinical situations. eGFR's persistently <60 mL/min signify possible Chronic Kidney Disease.   . Anion gap 11/06/2015 12  5 - 15 Final  . WBC 11/06/2015 0.5* 3.6 - 11.0 K/uL Final   Comment: CRITICAL RESULT CALLED TO, READ BACK BY AND VERIFIED WITH: Harle Battiest 11/06/15 @ 2005  [place]   . RBC 11/06/2015 2.96* 3.80 - 5.20 MIL/uL Final  . Hemoglobin 11/06/2015 8.7* 12.0 - 16.0 g/dL Final  . HCT 11/06/2015 26.3* 35.0 - 47.0 % Final  . MCV 11/06/2015 88.7  80.0 - 100.0 fL Final  . MCH 11/06/2015 29.2  26.0 - 34.0 pg Final  . MCHC 11/06/2015 33.0  32.0 - 36.0 g/dL Final  . RDW 11/06/2015 16.6* 11.5 - 14.5 % Final  . Platelets 11/06/2015 175  150 - 440 K/uL Final  . Neutrophils Relative % 11/06/2015 18  % Final  . Lymphocytes Relative 11/06/2015 72  % Final  . Monocytes Relative 11/06/2015 2  % Final  . Eosinophils Relative 11/06/2015 2  % Final  . Basophils Relative 11/06/2015 6  % Final  . Neutro Abs 11/06/2015 0.1* 1.4 - 6.5 K/uL Final  . Lymphs Abs 11/06/2015 0.4* 1.0 - 3.6 K/uL Final  . Monocytes Absolute 11/06/2015 0.0* 0.2 - 0.9 K/uL Final  . Eosinophils Absolute 11/06/2015 0.0  0 - 0.7 K/uL Final  . Basophils Absolute 11/06/2015 0.0  0 - 0.1 K/uL Final  . Smear Review 11/06/2015 MORPHOLOGY UNREMARKABLE   Final  . Troponin I 11/06/2015 <0.03  <0.03 ng/mL Final  Admission on 11/05/2015, Discharged on 11/05/2015  Component Date Value Ref Range Status  . WBC 11/05/2015 0.8* 3.6 - 11.0 K/uL Final   Comment: CRITICAL RESULT CALLED TO, READ BACK BY AND VERIFIED  WITH: Odis Hollingshead RN AT 1610 11/05/15 MSS TOO FEW TO COUNT, SMEAR AVAILABLE FOR REVIEW   . RBC 11/05/2015 2.79* 3.80 - 5.20 MIL/uL Final  . Hemoglobin 11/05/2015 8.4* 12.0 - 16.0 g/dL Final  . HCT 11/05/2015 24.5* 35.0 - 47.0 % Final  . MCV 11/05/2015 87.7  80.0 - 100.0 fL Final  . MCH 11/05/2015 29.9  26.0 - 34.0 pg Final  . MCHC 11/05/2015 34.1  32.0 - 36.0 g/dL Final  . RDW 11/05/2015 16.9* 11.5 - 14.5 % Final  . Platelets 11/05/2015 243  150 - 440 K/uL Final  . Neutrophils Relative % 11/05/2015 20%  % Final  . Neutro Abs 11/05/2015 0.2* 1.4 - 6.5 K/uL Final  . Lymphocytes Relative 11/05/2015 70%  % Final  . Lymphs Abs 11/05/2015 0.5* 1.0 - 3.6 K/uL Final  . Monocytes Relative 11/05/2015 3%  % Final  . Monocytes Absolute 11/05/2015 0.0* 0.2 - 0.9 K/uL Final  . Eosinophils Relative 11/05/2015 1%  % Final  . Eosinophils Absolute 11/05/2015 0.0  0 - 0.7 K/uL Final  . Basophils Relative 11/05/2015 6%  % Final  . Basophils Absolute 11/05/2015 0.0  0 - 0.1 K/uL Final  . Lactic Acid, Venous 11/05/2015 1.4  0.5 - 1.9 mmol/L Final  . Sodium 11/05/2015 138  135 - 145 mmol/L Final  . Potassium 11/05/2015 4.7  3.5 - 5.1 mmol/L Final  . Chloride 11/05/2015 108  101 - 111 mmol/L Final  . CO2 11/05/2015 20* 22 - 32 mmol/L Final  . Glucose, Bld 11/05/2015 112* 65 - 99 mg/dL Final  . BUN 11/05/2015 26* 6 - 20 mg/dL Final  . Creatinine, Ser 11/05/2015 1.39* 0.44 - 1.00 mg/dL Final  . Calcium 11/05/2015 9.6  8.9 - 10.3 mg/dL Final  . Total Protein 11/05/2015 6.9  6.5 - 8.1 g/dL Final  . Albumin 11/05/2015 2.9* 3.5 - 5.0 g/dL Final  . AST 11/05/2015 16  15 - 41 U/L Final  . ALT 11/05/2015 8* 14 - 54 U/L Final  . Alkaline Phosphatase 11/05/2015 88  38 - 126 U/L Final  . Total Bilirubin 11/05/2015 0.6  0.3 - 1.2 mg/dL Final  . GFR calc non Af Amer 11/05/2015 37* >60 mL/min Final  . GFR calc Af Amer 11/05/2015 43* >60 mL/min Final   Comment: (NOTE) The eGFR has been calculated using the CKD  EPI equation. This calculation has not been validated in all clinical situations. eGFR's persistently <60 mL/min signify possible Chronic Kidney Disease.   . Anion gap 11/05/2015 10  5 - 15 Final  . Lipase 11/05/2015 18  11 - 51 U/L Final  . Color, Urine 11/05/2015 YELLOW* YELLOW Final  . APPearance 11/05/2015 HAZY* CLEAR Final  . Glucose, UA 11/05/2015 NEGATIVE  NEGATIVE mg/dL Final  . Bilirubin Urine 11/05/2015 NEGATIVE  NEGATIVE Final  . Ketones, ur 11/05/2015 NEGATIVE  NEGATIVE mg/dL Final  . Specific Gravity, Urine 11/05/2015 1.013  1.005 - 1.030 Final  . Hgb urine dipstick 11/05/2015 NEGATIVE  NEGATIVE Final  . pH 11/05/2015 5.0  5.0 - 8.0 Final  . Protein, ur 11/05/2015 30* NEGATIVE mg/dL Final  . Nitrite 11/05/2015 NEGATIVE  NEGATIVE Final  . Leukocytes, UA 11/05/2015 NEGATIVE  NEGATIVE Final  . RBC / HPF 11/05/2015 0-5  0 - 5 RBC/hpf Final  . WBC, UA 11/05/2015 0-5  0 - 5 WBC/hpf Final  . Bacteria, UA 11/05/2015 NONE SEEN  NONE SEEN Final  . Squamous Epithelial / LPF 11/05/2015 0-5* NONE SEEN Final  . Specimen Description 11/06/2015 URINE, RANDOM   Final  . Special Requests 11/06/2015 NONE   Final  . Culture 11/06/2015 MULTIPLE SPECIES PRESENT, SUGGEST RECOLLECTION*  Final  . Report Status 11/06/2015 11/06/2015 FINAL   Final  Appointment on 11/05/2015  Component Date Value Ref Range Status  . WBC 11/05/2015 1.1* 3.6 - 11.0 K/uL Final   Comment: RESULT REPEATED AND VERIFIED CANCER CENTER CRITICAL VALUE PROTOCOL   . RBC 11/05/2015 3.08* 3.80 - 5.20 MIL/uL Final  . Hemoglobin 11/05/2015 9.0* 12.0 - 16.0 g/dL Final  . HCT 11/05/2015 27.0* 35.0 - 47.0 % Final  . MCV 11/05/2015 87.4  80.0 - 100.0 fL Final  . MCH 11/05/2015 29.2  26.0 - 34.0 pg Final  . MCHC 11/05/2015 33.4  32.0 - 36.0 g/dL Final  . RDW 11/05/2015 17.1* 11.5 - 14.5 % Final  . Platelets 11/05/2015 296  150 - 440 K/uL Final  . Neutro Abs 11/05/2015 0.2* 1.7 - 7.7 K/uL Final   Comment: RESULT REPEATED AND  VERIFIED CRITICAL RESULT CALLED TO, READ BACK BY AND VERIFIED WITH: SHERRY VENABLE AT 1610 11/05/2015 K[facility]   . Neutrophils Relative % 11/05/2015 22%  % Final  . Lymphocytes Relative 11/05/2015 74%  % Final  . Lymphs Abs 11/05/2015 0.8* 1.0 - 3.6 K/uL Final  . Monocytes Relative 11/05/2015 2%  % Final  . Monocytes Absolute 11/05/2015 0.0* 0.2 - 0.9 K/uL Final  .  Eosinophils Relative 11/05/2015 1%  % Final  . Eosinophils Absolute 11/05/2015 0.0  0 - 0.7 K/uL Final  . Basophils Relative 11/05/2015 1%  % Final  . Basophils Absolute 11/05/2015 0.0  0 - 0.1 K/uL Final    Assessment:  Sandy [name] is a 70 y.o. female with metastatic colon cancer. She presented with a 53 pound weight loss in 6 months. Colonoscopy on 09/07/2014 revealed a circumferential partially obstructive fungating mass in the descending colon. There was no active bleeding. There was a 1.5 cm pedunculated polyp in the sigmoid colon (tubulovillous adenoma). Pathology from the descending colon mass revealed fragments of adenocarcinoma. There was not enough tissue for KRAS testing.  CEA was 11,793 on 09/05/2014.  KRAS and NRAS mutational analysis on 09/02/2015 was incomplete.  KRAS results from analysis of codons 59, 61, and 117 were negative.  Results were not obtained for codons 12, 13, and 146.  NRAS gene results from analysis of codons 12 and 13 were negative.  Results were not obtained for codons 59, 69, 61, 117, and 146.  Chest CT angiogram on 08/28/2014 revealed no evidence of pulmonary embolism, but multiple large enhancing liver masses (largest 6.4 cm).  PET scan on 10/04/2014 revealed 6.5 x 6.1 cm hypermetabolic mass in the proximal sigmoid colon,  There were  numerous large hypermetabolic hepatic metastases. There were no additional sites of metastatic disease in the neck, chest or skeleton.  In addition, there was a 3.2 x 2.8 cm low-attenuation right thyroid nodule.   She is status post 14 cycles of FOLFOX  chemotherapy (10/09/2014 - 04/11/2015).  She declined Avastin.  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    CEA was 10,169 on 10/09/2014 , 12,034 on 0712/2016, 7765 on 11/20/2014, 5154 on 12/04/2014, 1921 on 01/01/2015, 1266 on 01/15/2015, 770.6 on 01/29/2015, 502.7 on 02/12/2015, 368.7 on 02/26/2015, 269 on 03/14/2015, 178.3 on 03/28/2015, 135.9 on 04/11/2015, 116.1 on 05/13/2015, 90.8 on 06/10/2015, 145.9 on 07/08/2015, 300.7 on 08/05/2015, 296.3 on 08/19/2015, and 443.0 on 09/24/2015, and 1341 on 10/08/2015.  Abdominal and pelvic CT scan on 12/28/2014 revealed response to therapy.  Liver metastases and the descending/sigmoid mass were smaller.  There was no new disease.  Abdomen and pelvic CT scan on 03/25/2015 revealed interval decrease in size of hepatic metastasis and distal descending colon mass.   Abdominal and pelvic CT scan on 03/25/2015 revealed decreasing hepatic metastasis and distal descending colon mass.  Right hepatic lobe lesion was 2.2 x 2.1 cm.  Left lateral hepatic lobe lesion was 1.9 x 1.5 cm.  There was stable thickening of the adrenal glands.  She is status post 8 cycles of 5-fluorouracil and leucovorin (04/28/2014 - 09/24/2015).  Cycle #4 was held on 06/10/2015 secondary to rectal bleeding.  Abdominal and pelvic MRI on 08/01/2015 revealed multiple hypoenhancing hepatic metastasis in both lobes of the liver (3.3 x 2.2 cm in segment VII, 5.9 x 4.1 cm in segment VIII, and a 2.4 x 1.7 cm caudate lesion).  There was a 4.3 cm length of focally narrowed sigmoid colon.  There was nodular thickening of the right adrenal gland.  PET scan on 08/21/2015 revealed interval improvement in the multifocal hepatic metastatic disease. There was residual hypermetabolic tumor.  The primary colonic lesion at the junction of the descending and sigmoid colon had decreased in size and metabolic activity.  There was persistent hypermetabolic activity in the lower right neck, possibly  associated with the thyroid gland.   Abdominal and pelvic  CT scan on 10/04/2015 revealed increased bulkiness of soft tissue mass in distal descending colon compared with previous studies, consistent with primary colon carcinoma.  The mass measured 4.7 cm compared to 3.3 cm.  The liver metastasis were felt larger by second radiology review.  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  Afirma gene expression classifier GEC) was suspicious.  Gene expression signature for medullary thyroid cancer (MTC) and BRAF [id number] were negative.  SHe is currently day 10 s/p cycle #2 FOLFIRI chemotherapy (10/08/2015 - 10/29/2015).  Cycle #1 complicated by an admission for fever and neutropenia on day 11 (10/18/2015).  All cultures were negative.  She was discharged to [facility] (rehabilitation facility).    She has altered mental status.  She has had 2 melanotic stools today.  She is neutropenic and hypothermic.  Oral intake is poor.   Plan: 1.  Oncology:  Day 10 s/p cycle #2 FOLFIRI with Neulasta support.  Current status of metastatic disease unknown as patient recently started on FOLFIRI.  Prior to initiation of FOLFIRI, disease had progressed.  Discussed code status with family as patient critically ill.  Daughter is medical power of attorney.  She wishes patient to be full code.  2.  Hematology:  Patient neutropenic.  Neutropenic precautions.  Concern for infection given low ANC, altered mental status, and hypothermia.  Anemia with melanotic stool.  Suspect hematocrit is lower as patient dehydrated.  Patient will likley require transfusion support.  Type and hold obtained.  Daily CBC with diff.  3.  Infectious disease:  ANC 0.  Blood cultures obtained in clinic.  UA and culture to be obtained once admitted.  Will also repeat CXR on admission.  CXR yesterday was negative.  Emperic broad spectrum antibiotics with Cefepime +  vancomycin (renal adjusted doses).  4.  Neurology:  Altered mental status likely secondary to infection versus metabolic etiology.  Head CT yesterday was negative.  5.  Fluids, electrolytes, nutrition:  Minimal po intake x 2 days.  Baseline creatinine 0.66-0.80.  Recent IV contrast with CT scan + dehydration resulting in increased creatinine (1.39).  IVF.  Follow electrolytes.  6.  Disposition:  Patient to be admitted to step down section of ICU.  Full code.     Lequita Asal, MD  11/07/15, 1:02 PM

## 2015-11-07 NOTE — Progress Notes (Signed)
Patient not able to communicate to much.  Family was in need of grief counseling and how to go about handling major life changes.  Prayed with family.  Chaplain Gwyndolyn Saxon

## 2015-11-07 NOTE — Progress Notes (Signed)
Patient is here today with nephew and daughter. She is not doing so well today. She verbalizes no pain but makes noises like she is in pain, unsure of how many stools. Asked [facility] about stools and they never answered.

## 2015-11-07 NOTE — Plan of Care (Signed)
Problem: Education: Goal: Knowledge of [facility] Education information/materials will improve Outcome: Completed/Met Date Met: 11/07/15 family

## 2015-11-07 NOTE — ED Notes (Signed)
E-signature pad in room saying view only and daughter unable to sign. Daughter signed on signature page that this RN printed out.

## 2015-11-07 NOTE — Progress Notes (Signed)
Pharmacy Antibiotic Note  Sandy [name] is a 70 y.o. female admitted on 11/07/2015 with febrile neutropenia.  Pharmacy has been consulted for vancomycin and cefepime dosing.  Plan: Vancomycin 750 IV every 24 hours with stacked dosing and a trough with the 5th total dose.  Goal trough 15-20 mcg/mL.  Cefepime 2 g iv q 12 hours.   Ke: 0.033 Vd: 42.7  T1/2: 22   Height: 5\' 6"  (167.6 cm) Weight: 134 lb 7.7 oz (61 kg) IBW/kg (Calculated) : 59.3  Temp (24hrs), Avg:98.1 F (36.7 C), Min:97.8 F (36.6 C), Max:98.5 F (36.9 C)   Recent Labs Lab 11/05/15 1007 11/05/15 1257 11/05/15 1258 11/06/15 1924 11/07/15 1156 11/07/15 1325  WBC 1.1* 0.8*  --  0.5* 0.3*  --   CREATININE  --  1.39*  --  1.23*  --  1.43*  LATICACIDVEN  --   --  1.4  --   --   --     Estimated Creatinine Clearance: 33.8 mL/min (by C-G formula based on SCr of 1.43 mg/dL).    No Known Allergies  Antimicrobials this admission: vancomycin 7/27 >>  cefepime 7/27 >>   Dose adjustments this admission:   Microbiology results:  Thank you for allowing pharmacy to be a part of this patient's care.  [name] 11/07/2015 4:18 PM

## 2015-11-07 NOTE — Telephone Encounter (Signed)
Per Dr Mike Gip, see patient today with CBC.  Sandy [name] agrees to have her here at 1045 for lab, 1100 for md

## 2015-11-07 NOTE — Telephone Encounter (Signed)
Called to report that she had a large black stool this morning. Was in ER last night for AMS/ Somnolence. States she seems a little better this morning being in her own room at home. She did not take her back to [facility]. She said she put the stool in a baggy. Please advise

## 2015-11-08 ENCOUNTER — Inpatient Hospital Stay
Admission: AD | Admit: 2015-11-08 | Discharge: 2015-11-08 | Disposition: A | Discharge status: Home / Self Care | Payer: Medicare Other | Source: Ambulatory Visit | Attending: Critical Care Medicine | Admitting: Critical Care Medicine

## 2015-11-08 DIAGNOSIS — R400 Somnolence: Secondary | ICD-10-CM

## 2015-11-08 DIAGNOSIS — C787 Secondary malignant neoplasm of liver and intrahepatic bile duct: Secondary | ICD-10-CM

## 2015-11-08 DIAGNOSIS — D709 Neutropenia, unspecified: Secondary | ICD-10-CM

## 2015-11-08 DIAGNOSIS — D5780 Other sickle-cell disorders without crisis: Secondary | ICD-10-CM

## 2015-11-08 DIAGNOSIS — K5900 Constipation, unspecified: Secondary | ICD-10-CM

## 2015-11-08 DIAGNOSIS — Z7984 Long term (current) use of oral hypoglycemic drugs: Secondary | ICD-10-CM

## 2015-11-08 DIAGNOSIS — C189 Malignant neoplasm of colon, unspecified: Secondary | ICD-10-CM

## 2015-11-08 DIAGNOSIS — K921 Melena: Secondary | ICD-10-CM

## 2015-11-08 DIAGNOSIS — N189 Chronic kidney disease, unspecified: Secondary | ICD-10-CM

## 2015-11-08 DIAGNOSIS — T451X5S Adverse effect of antineoplastic and immunosuppressive drugs, sequela: Secondary | ICD-10-CM

## 2015-11-08 DIAGNOSIS — C799 Secondary malignant neoplasm of unspecified site: Secondary | ICD-10-CM

## 2015-11-08 DIAGNOSIS — E860 Dehydration: Secondary | ICD-10-CM

## 2015-11-08 DIAGNOSIS — R680 Hypothermia, not associated with low environmental temperature: Secondary | ICD-10-CM

## 2015-11-08 DIAGNOSIS — D649 Anemia, unspecified: Secondary | ICD-10-CM

## 2015-11-08 DIAGNOSIS — I4891 Unspecified atrial fibrillation: Secondary | ICD-10-CM

## 2015-11-08 DIAGNOSIS — G629 Polyneuropathy, unspecified: Secondary | ICD-10-CM

## 2015-11-08 DIAGNOSIS — J150 Pneumonia due to Klebsiella pneumoniae: Secondary | ICD-10-CM

## 2015-11-08 DIAGNOSIS — B961 Klebsiella pneumoniae [K. pneumoniae] as the cause of diseases classified elsewhere: Secondary | ICD-10-CM

## 2015-11-08 DIAGNOSIS — E119 Type 2 diabetes mellitus without complications: Secondary | ICD-10-CM

## 2015-11-08 DIAGNOSIS — I129 Hypertensive chronic kidney disease with stage 1 through stage 4 chronic kidney disease, or unspecified chronic kidney disease: Secondary | ICD-10-CM

## 2015-11-08 DIAGNOSIS — Z7982 Long term (current) use of aspirin: Secondary | ICD-10-CM

## 2015-11-08 DIAGNOSIS — Z79899 Other long term (current) drug therapy: Secondary | ICD-10-CM

## 2015-11-08 LAB — CBC
HEMATOCRIT: 19.8 % — AB (ref 35.0–47.0)
HEMOGLOBIN: 6.5 g/dL — AB (ref 12.0–16.0)
MCH: 28.7 pg (ref 26.0–34.0)
MCHC: 32.7 g/dL (ref 32.0–36.0)
MCV: 87.6 fL (ref 80.0–100.0)
Platelets: 66 10*3/uL — ABNORMAL LOW (ref 150–440)
RBC: 2.26 MIL/uL — AB (ref 3.80–5.20)
RDW: 17.2 % — ABNORMAL HIGH (ref 11.5–14.5)
WBC: 0.2 10*3/uL — CL (ref 3.6–11.0)

## 2015-11-08 LAB — BLOOD CULTURE ID PANEL (REFLEXED)
Acinetobacter baumannii: NOT DETECTED
Candida albicans: NOT DETECTED
Candida glabrata: NOT DETECTED
Candida krusei: NOT DETECTED
Candida parapsilosis: NOT DETECTED
Candida tropicalis: NOT DETECTED
Carbapenem resistance: NOT DETECTED
Enterobacter cloacae complex: NOT DETECTED
Enterobacteriaceae species: DETECTED — AB
Enterococcus species: NOT DETECTED
Escherichia coli: NOT DETECTED
Haemophilus influenzae: NOT DETECTED
Klebsiella oxytoca: NOT DETECTED
Klebsiella pneumoniae: DETECTED — AB
Listeria monocytogenes: NOT DETECTED
Methicillin resistance: NOT DETECTED
Neisseria meningitidis: NOT DETECTED
Proteus species: NOT DETECTED
Pseudomonas aeruginosa: NOT DETECTED
Serratia marcescens: NOT DETECTED
Staphylococcus aureus (BCID): NOT DETECTED
Staphylococcus species: NOT DETECTED
Streptococcus agalactiae: NOT DETECTED
Streptococcus pneumoniae: NOT DETECTED
Streptococcus pyogenes: NOT DETECTED
Streptococcus species: NOT DETECTED
Vancomycin resistance: NOT DETECTED

## 2015-11-08 LAB — BASIC METABOLIC PANEL
ANION GAP: 6 (ref 5–15)
BUN: 15 mg/dL (ref 6–20)
CO2: 20 mmol/L — AB (ref 22–32)
Calcium: 9.1 mg/dL (ref 8.9–10.3)
Chloride: 115 mmol/L — ABNORMAL HIGH (ref 101–111)
Creatinine, Ser: 1 mg/dL (ref 0.44–1.00)
GFR calc non Af Amer: 55 mL/min — ABNORMAL LOW (ref >60)
GLUCOSE: 146 mg/dL — AB (ref 65–99)
POTASSIUM: 3.6 mmol/L (ref 3.5–5.1)
Sodium: 141 mmol/L (ref 135–145)

## 2015-11-08 LAB — HEMOGLOBIN AND HEMATOCRIT, BLOOD
HEMATOCRIT: 23.5 % — AB (ref 35.0–47.0)
HEMOGLOBIN: 7.8 g/dL — AB (ref 12.0–16.0)

## 2015-11-08 LAB — GLUCOSE, CAPILLARY
GLUCOSE-CAPILLARY: 101 mg/dL — AB (ref 65–99)
Glucose-Capillary: 107 mg/dL — ABNORMAL HIGH (ref 65–99)
Glucose-Capillary: 131 mg/dL — ABNORMAL HIGH (ref 65–99)
Glucose-Capillary: 143 mg/dL — ABNORMAL HIGH (ref 65–99)

## 2015-11-08 LAB — ECHOCARDIOGRAM COMPLETE
HEIGHTINCHES: 66 in
Weight: 2151.69 oz

## 2015-11-08 LAB — PREPARE RBC (CROSSMATCH)

## 2015-11-08 MED ORDER — AMIODARONE LOAD VIA INFUSION
150.0000 mg | Freq: Once | INTRAVENOUS | Status: AC
  Administered 2015-11-08: 150 mg via INTRAVENOUS
  Filled 2015-11-08: qty 83.34

## 2015-11-08 MED ORDER — CETYLPYRIDINIUM CHLORIDE 0.05 % MT LIQD
7.0000 mL | Freq: Two times a day (BID) | OROMUCOSAL | Status: DC
  Administered 2015-11-09 – 2015-11-12 (×3): 7 mL via OROMUCOSAL

## 2015-11-08 MED ORDER — METOPROLOL TARTRATE 5 MG/5ML IV SOLN
INTRAVENOUS | Status: AC
  Administered 2015-11-08: 5 mg via INTRAVENOUS
  Filled 2015-11-08: qty 5

## 2015-11-08 MED ORDER — METOPROLOL TARTRATE 5 MG/5ML IV SOLN
5.0000 mg | INTRAVENOUS | Status: AC
  Administered 2015-11-08 (×3): 5 mg via INTRAVENOUS

## 2015-11-08 MED ORDER — DIGOXIN 0.25 MG/ML IJ SOLN: 0.2500 mg | Freq: Once | INTRAMUSCULAR | Status: DC

## 2015-11-08 MED ORDER — CHLORHEXIDINE GLUCONATE 0.12 % MT SOLN
15.0000 mL | Freq: Two times a day (BID) | OROMUCOSAL | Status: DC
  Administered 2015-11-09 – 2015-11-12 (×7): 15 mL via OROMUCOSAL
  Filled 2015-11-08 (×9): qty 15

## 2015-11-08 MED ORDER — DILTIAZEM HCL 30 MG PO TABS
30.0000 mg | ORAL_TABLET | Freq: Four times a day (QID) | ORAL | Status: DC
  Administered 2015-11-09 – 2015-11-10 (×6): 30 mg via ORAL
  Filled 2015-11-08 (×6): qty 1

## 2015-11-08 MED ORDER — SODIUM CHLORIDE 0.9 % IV SOLN
Freq: Once | INTRAVENOUS | Status: AC
  Administered 2015-11-08: 17:00:00 via INTRAVENOUS

## 2015-11-08 MED ORDER — AMIODARONE HCL IN DEXTROSE 360-4.14 MG/200ML-% IV SOLN
30.0000 mg/h | INTRAVENOUS | Status: DC
  Administered 2015-11-08 – 2015-11-09 (×2): 30 mg/h via INTRAVENOUS
  Filled 2015-11-08 (×7): qty 200

## 2015-11-08 MED ORDER — INSULIN ASPART 100 UNIT/ML ~~LOC~~ SOLN
0.0000 [IU] | SUBCUTANEOUS | Status: DC
  Administered 2015-11-08 – 2015-11-09 (×3): 1 [IU] via SUBCUTANEOUS
  Administered 2015-11-10: 2 [IU] via SUBCUTANEOUS
  Administered 2015-11-10: 1 [IU] via SUBCUTANEOUS
  Administered 2015-11-11: 3 [IU] via SUBCUTANEOUS
  Administered 2015-11-12: 1 [IU] via SUBCUTANEOUS
  Administered 2015-11-12: 2 [IU] via SUBCUTANEOUS
  Filled 2015-11-08: qty 1
  Filled 2015-11-08: qty 3
  Filled 2015-11-08 (×3): qty 1
  Filled 2015-11-08: qty 2
  Filled 2015-11-08: qty 1
  Filled 2015-11-08 (×2): qty 2

## 2015-11-08 MED ORDER — AMIODARONE HCL IN DEXTROSE 360-4.14 MG/200ML-% IV SOLN
60.0000 mg/h | INTRAVENOUS | Status: AC
  Administered 2015-11-08: 60 mg/h via INTRAVENOUS
  Filled 2015-11-08 (×2): qty 200

## 2015-11-08 MED ORDER — DIGOXIN 0.25 MG/ML IJ SOLN
INTRAMUSCULAR | Status: AC
  Administered 2015-11-08: 0.5 mg via INTRAVENOUS
  Filled 2015-11-08: qty 2

## 2015-11-08 MED ORDER — DILTIAZEM HCL 100 MG IV SOLR
5.0000 mg/h | INTRAVENOUS | Status: DC
  Administered 2015-11-08: 10 mg/h via INTRAVENOUS
  Administered 2015-11-08: 5 mg/h via INTRAVENOUS
  Filled 2015-11-08: qty 100

## 2015-11-08 MED ORDER — METOPROLOL TARTRATE 5 MG/5ML IV SOLN
INTRAVENOUS | Status: AC
  Filled 2015-11-08: qty 5

## 2015-11-08 MED ORDER — DIGOXIN 0.25 MG/ML IJ SOLN
0.5000 mg | INTRAMUSCULAR | Status: AC
  Administered 2015-11-08: 0.5 mg via INTRAVENOUS

## 2015-11-08 MED ORDER — DILTIAZEM HCL 25 MG/5ML IV SOLN
10.0000 mg | Freq: Once | INTRAVENOUS | Status: AC
Start: 2015-11-08 — End: 2015-11-08
  Administered 2015-11-08: 10 mg via INTRAVENOUS
  Filled 2015-11-08: qty 5

## 2015-11-08 MED ORDER — DIGOXIN 0.25 MG/ML IJ SOLN
0.2500 mg | Freq: Once | INTRAMUSCULAR | Status: DC
Start: 2015-11-08 — End: 2015-11-11
  Filled 2015-11-08: qty 2

## 2015-11-08 MED ORDER — DIGOXIN 0.25 MG/ML IJ SOLN
0.1250 mg | Freq: Every day | INTRAMUSCULAR | Status: DC
  Administered 2015-11-09 – 2015-11-10 (×2): 0.125 mg via INTRAVENOUS
  Filled 2015-11-08: qty 0.5

## 2015-11-08 NOTE — Consult Note (Signed)
[facility]  Date of admission:  11/07/2015  Inpatient day:  11/08/15  Consulting physician:  Dr. Max Sane  Reason for Consultation:  Stage IV colon cancer.  Chief Complaint: Sandy [name] is a 70 y.o. female with stage IV colon cancer who was admitted on day 10 s/p cycle #2 FOLFIRI chemotherapy with altered mental status, melanotic stools, neutropenia and hypothermia.  HPI:  The patient received cycle #2 FOLFIRI on 10/29/2015 followed by Neulasta support on 11/01/2015.  She did well until 11/05/2015 when she presented to the ER with right sided abdominal pain.   Abdominal and pelvic CT scan revealed no significant change in hepatic metastasis.  There was fullness at the junction of the descending colon and proximal sigmoid colon similar to prior scans.  Labs included a hematocrit of 24.5, hemoglobin 8.4, platelets 243,000, WBC 800 with an ANC of 300.  She received IVF and was discharged back to [facility].  She was seen in the ER on 11/06/2015 with altered mental status (somnolence).  Head CT was negative.  CXR was negative.  Labs included a creatinine of 1.23.  CBC revealed a hematocrit of 26.3, hemoglobin 8.7, WBC was 500 with an ANC of 100.  Her family brought her home.  Her family contacted the clinic on 11/07/2015 secondary to 2 episodes of melanotic stool.  She has been non-verbal.  She had very little to eat or drink.  She had no fever, nausea or vomiting.  She did not complain of pain.     She was seen in clinic.  Blood cultures were drawn.  She was directly admitted to the ICU with concern for sepsis.  She was started on Cefepime and vancomycin.  Blood cultures are growing Klebsiella.  She has had atrial fibrillation with RVR.  She has been treated with cardizem and metoprolol.  She has remained lethargic, but is more alert.  She is receiving IVF for dehydration and acute renal insufficiency.  She is being transfused with PRBCs for anemia.  She remains  neutropenic.   Past Medical History:  Diagnosis Date  . Cancer ([place])    liver mets; colon cancer  . Constipation   . Diabetes mellitus without complication ([place])   . HOH (hard of hearing)   . Hypertension   . Insomnia   . 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 Sickle cell anemia (HCC)   . Wears dentures    partial upper and lower    Past Surgical History:  Procedure Laterality Date  . CARDIAC CATHETERIZATION    . COLONOSCOPY WITH PROPOFOL N/A 09/07/2014   Procedure: COLONOSCOPY WITH PROPOFOL;  Surgeon: Lucilla Lame, MD;  Location: [facility] ENDOSCOPY;  Service: Endoscopy;  Laterality: N/A;  . ESOPHAGOGASTRODUODENOSCOPY N/A 09/07/2014   Procedure: ESOPHAGOGASTRODUODENOSCOPY (EGD);  Surgeon: Lucilla Lame, MD;  Location: [facility] ENDOSCOPY;  Service: Endoscopy;  Laterality: N/A;  . FLEXIBLE SIGMOIDOSCOPY N/A 09/02/2015   Procedure: FLEXIBLE SIGMOIDOSCOPY;  Surgeon: Lucilla Lame, MD;  Location: [place];  Service: Endoscopy;  Laterality: N/A;  Diabetic - oral meds Pt has port-a-cath  . LIVER BIOPSY    . PORTACATH PLACEMENT Left 09/24/2014   Procedure: INSERTION PORT-A-CATH;  Surgeon: Robert Bellow, MD;  Location: [facility] ORS;  Service: General;  Laterality: Left;    Family History  Problem Relation Age of Onset  . Cancer Sister     Social History:  reports that she quit smoking about 2 years ago. Her smoking use  included Cigarettes. She has a 5.00 pack-year smoking history. She has never used smokeless tobacco. She reports that she does not drink alcohol or use drugs.  She is accompanied by her son.  Allergies: No Known Allergies  Medications Prior to Admission  Medication Sig Dispense Refill  . amLODipine (NORVASC) 5 MG tablet Take 5 mg by mouth daily. Reported on 09/24/2015    . aspirin EC 81 MG tablet Take 81 mg by mouth daily. Reported on 08/26/2015    . atorvastatin (LIPITOR) 40 MG tablet Take 40 mg by mouth daily.    . cefUROXime (CEFTIN) 250 MG tablet  Take 1 tablet (250 mg total) by mouth 2 (two) times daily with a meal. 6 tablet 0  . diltiazem (CARDIZEM) 60 MG tablet Take 1 tablet (60 mg total) by mouth every 8 (eight) hours. 90 tablet 0  . docusate sodium (COLACE) 100 MG capsule Take 1 capsule (100 mg total) by mouth 2 (two) times daily. 10 capsule 0  . feeding supplement, ENSURE ENLIVE, (ENSURE ENLIVE) LIQD Take 237 mLs by mouth 2 (two) times daily between meals. 237 mL 12  . gabapentin (NEURONTIN) 100 MG capsule Take 2 capsules (200 mg total) by mouth at bedtime. 1 tab daily and 2 tabs at bedtime. 60 capsule 0  . lidocaine-prilocaine (EMLA) cream Apply 1 application topically as needed. Apply to port 1 hour prior to chemotherapy appointment. Cover with plastic wrap. 30 g 1  . lisinopril (PRINIVIL,ZESTRIL) 10 MG tablet Take 10 mg by mouth daily.    . magnesium 30 MG tablet Take 30 mg by mouth daily.     . megestrol (MEGACE) 400 MG/10ML suspension Take 5 mLs (200 mg total) by mouth daily. to stimulate appetite 240 mL 0  . metFORMIN (GLUCOPHAGE) 1000 MG tablet Take 1,000 mg by mouth 2 (two) times daily.     . Multiple Vitamin (MULTIVITAMIN WITH MINERALS) TABS tablet Take 1 tablet by mouth daily.    . ondansetron (ZOFRAN) 4 MG tablet Take 1 tablet (4 mg total) by mouth every 8 (eight) hours as needed for nausea or vomiting. 30 tablet 2  . oxyCODONE (OXY IR/ROXICODONE) 5 MG immediate release tablet Take 1 tablet (5 mg total) by mouth every 6 (six) hours as needed for severe pain. 20 tablet 0  . traZODone (DESYREL) 50 MG tablet Take 0.5 tablets (25 mg total) by mouth at bedtime as needed for sleep. 20 tablet 0    Review of Systems: GENERAL: No complaints (patient non-verbal).  Febrile. PERFORMANCE STATUS (ECOG): 3. HEENT: Wears a hearing aide. No complaints of sore throat, mouth sores or tenderness. Lungs: No apparent shortness of breath .  No cough. No hemoptysis. Cardiac: No apparent chest pain, or palpitations. GI:  Minimal oral  intake.  Black loose stool yesterday.  Green stool today.  No nausea, vomiting or hematochezia. GU: Unable to assess dysuria or hematuria. Musculoskeletal: No apparent back pain with movement. Extremities: No pain or swelling. Skin: No rashes, skin lesions, or ulcers Neuro:  Somnolent.  SGeneral weakness.  No apparent headache or focal weakness. Endocrine: Diabetes.  No thyroid issues. Pain: No apparent pain. Review of systems: All other systems reviewed   Physical Exam:  Blood pressure 127/70, pulse (!) 129, temperature 98.2 F (36.8 C), temperature source Axillary, resp. rate 17, height _0  (1.676 m), weight 134 lb 7.7 oz (61 kg), SpO2 99 %.  GENERAL: Elderly woman lying comfortably in the ICU, minimally communicative. MENTAL STATUS: Alert.  Eyes open. HEAD:  Alopecia totalis. Normocephalic, atraumatic, face symmetric, no Cushingoid features. EYES: Hazel/green eyes. Pupils equal round and reactive to light and accomodation. No conjunctivitis or scleral icterus. ENT: Clenching teeth.  Unable to visualize mouth. RESPIRATORY: Clear to auscultation without rales, wheezes or rhonchi. CARDIOVASCULAR: Irregular rhythm with II-II/VI flow murmur. ABDOMEN: Soft, non-tender with active bowel sounds. No guarding or rebound tenderness. No hepatosplenomegaly. SKIN: No rashes, ulcer or skin lesions.  EXTREMITIES: No edema, no skin discoloration or tenderness. No palpable cords. LYMPH NODES: No palpable supraclavicular, axillary or inguinal adenopathy  NEUROLOGICAL:  Somnolent.  Does not follow commands. Says 3 words.   Results for orders placed or performed during the hospital encounter of 11/07/15 (from the past 48 hour(s))  Ammonia     Status: None   Collection Time: 11/07/15  4:16 PM  Result Value Ref Range   Ammonia 11 9 - 35 umol/L  Basic metabolic panel     Status: Abnormal   Collection Time: 11/08/15  6:11 AM  Result Value Ref Range   Sodium 141 135 - 145 mmol/L    Potassium 3.6 3.5 - 5.1 mmol/L   Chloride 115 (H) 101 - 111 mmol/L   CO2 20 (L) 22 - 32 mmol/L   Glucose, Bld 146 (H) 65 - 99 mg/dL   BUN 15 6 - 20 mg/dL   Creatinine, Ser 1.00 0.44 - 1.00 mg/dL   Calcium 9.1 8.9 - 10.3 mg/dL   GFR calc non Af Amer 55 (L) >60 mL/min   GFR calc Af Amer >60 >60 mL/min    Comment: (NOTE) The eGFR has been calculated using the CKD EPI equation. This calculation has not been validated in all clinical situations. eGFR's persistently <60 mL/min signify possible Chronic Kidney Disease.    Anion gap 6 5 - 15  CBC     Status: Abnormal   Collection Time: 11/08/15  6:11 AM  Result Value Ref Range   WBC 0.2 (LL) 3.6 - 11.0 K/uL    Comment: CRITICAL RESULT CALLED TO, READ BACK BY AND VERIFIED WITH: RENEE BABB AT 8841 ON 11/08/15.Marland KitchenMarland KitchenOakley    RBC 2.26 (L) 3.80 - 5.20 MIL/uL   Hemoglobin 6.5 (L) 12.0 - 16.0 g/dL   HCT 19.8 (L) 35.0 - 47.0 %   MCV 87.6 80.0 - 100.0 fL   MCH 28.7 26.0 - 34.0 pg   MCHC 32.7 32.0 - 36.0 g/dL   RDW 17.2 (H) 11.5 - 14.5 %   Platelets 66 (L) 150 - 440 K/uL  Glucose, capillary     Status: Abnormal   Collection Time: 11/08/15 11:50 AM  Result Value Ref Range   Glucose-Capillary 107 (H) 65 - 99 mg/dL  Type and screen [facility]     Status: None (Preliminary result)   Collection Time: 11/08/15  3:17 PM  Result Value Ref Range   ABO/RH(D) O POS    Antibody Screen NEG    Sample Expiration 11/11/2015    Unit Number [id number]    Blood Component Type RCLI PHER 2    Unit division 00    Status of Unit ISSUED    Transfusion Status OK TO TRANSFUSE    Crossmatch Result Compatible   Prepare RBC     Status: None   Collection Time: 11/08/15  3:17 PM  Result Value Ref Range   Order Confirmation ORDER PROCESSED BY BLOOD BANK   Glucose, capillary     Status: Abnormal   Collection Time: 11/08/15  5:12 PM  Result Value Ref  Range   Glucose-Capillary 131 (H) 65 - 99 mg/dL   Ct Head Wo Contrast  Result Date:  11/06/2015 CLINICAL DATA:  Altered mental status. Patient is currently being treated for colon cancer. EXAM: CT HEAD WITHOUT CONTRAST TECHNIQUE: Contiguous axial images were obtained from the base of the skull through the vertex without intravenous contrast. COMPARISON:  10/18/2015 FINDINGS: Brain: Mild atrophy with sulcal prominence and centralized volume loss with mild commensurate ex vacuo dilatation of the ventricular system, asymmetrically involving the anterior horns of the bilateral lateral ventricles. Minimal periventricular hypodensities compatible microvascular ischemic disease. Right basilar ganglial calcifications. The gray-white differentiation is otherwise well maintained without CT evidence of acute large territory infarct. No intraparenchymal or extra-axial mass or hemorrhage. Normal configuration of the ventricles and basilar cisterns. No midline shift. Vascular: Intracranial atherosclerosis Skull: No displaced calvarial fracture. Sinuses/Orbits: Limited visualization of the paranasal sinuses and mastoid air cells is normal. No air-fluid levels. Other: Regional soft tissues appear normal. IMPRESSION: Mild atrophy and microvascular ischemic disease without acute intracranial process. Electronically Signed   By: Sandi Mariscal M.D.   On: 11/06/2015 19:53  Dg Chest Portable 1 View  Result Date: 11/06/2015 CLINICAL DATA:  Lethargy and shallow breathing today. History of colon cancer. EXAM: PORTABLE CHEST 1 VIEW COMPARISON:  Single-view of the chest 10/18/2015. FINDINGS: Left subclavian approach Port-A-Cath is unchanged. Lungs are clear. Heart size is upper normal. No pneumothorax or pleural effusion. IMPRESSION: No acute disease. Electronically Signed   By: Inge Rise M.D.   On: 11/06/2015 19:34   Assessment:  The patient is a 70 y.o. woman with metastatic colon cancer currently day 11 s/p cycle #2 FOLFIRI chemotherapy with Neulasta support.  She was admitted with altered mental status.  She  has fever and neutropenia.  Blood culture ID panel reveals Enterobacteriaceae species and Klebsiella pneumoniae.  Plan:   1.  Oncology:  Day 11 s/p cycle #2 FOLFIRI with Neulasta support.  Current status of metastatic disease unknown as patient recently started on FOLFIRI.  Prior to initiation of FOLFIRI, disease had progressed.    2.  Hematology:  Patient neutropenic on neutropenic precautions.  Patient received Neulasta post chemotherapy.  Myelosuppression secondary to chemotherapy and consumption due to infection.  Anemia with history of melanotic stool.  Maintain active type and cross.  Transfuse as needed.  Patient receiving PRBCs today. Daily CBC with diff.  3.  Infectious disease:  ANC 0.  Blood cultures obtained in clinic.  Blood culture ID panel reveals Enterobacteriaceae species and Klebsiella pneumoniae.  Emperic broad spectrum antibiotics with Cefepime + vancomycin (renal adjusted doses).  Blood cultures q 24 hours prn temp > 100.4  4.  Neurology:  Altered mental status likely secondary to sepsis.  Ammonia is normal.  Recent head CT negative.  Head MRI planned when stable.  5.  Cardiology:  Atrial fibrillation with RVR similar to last admission.  Patient received cardizem then metoprolol.  Appreciate cardiology consult.  6.  Fluids, electrolytes, nutrition:  Minimal po intake x 2 days.  Baseline creatinine 0.66-0.80.  Recent IV contrast with CT scan + dehydration resulting in increased creatinine (1.39).  IVF.  Creatinine improved to 1.0.  Follow electrolytes.  Thank you for allowing me to participate in Sandy [name] 's care.  I will follow her closely with you while hospitalized and after discharge in the outpatient department.  Dr Grayland Ormond is on call this weekend.   Lequita Asal, MD  11/08/2015, 5:16 PM

## 2015-11-08 NOTE — Progress Notes (Signed)
Gave report to Terrence Dupont, Therapist, sports. Relinquished care @ this time. Pt. In stable condition.

## 2015-11-08 NOTE — Consult Note (Signed)
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 Now with altered mental status, and neutopenia with sepsis.   NEUROLOGIC --Metabolic encephalopathy; suspect due to sepsis.  --CT head negative will await MRI to r/o brain mets.   RENAL AKI.  --Likely due to dehydration, sepsis as well as from meds.  --Gently hydration.   GASTROINTESTINAL Abdominal pain, appears chronic, likely related to know liver mets.  --Continue pain control.   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  Neutropenia secondary to chemo.  History of sickle cell anemia; unsure that this is contributory to present condition.  --Continue abx.   CARDIOLOGY Afib with RVR.  --Currently on cardizem drip, will try to change to oral and wean off drip.   INFECTIOUS Neutopenia with sepis.  Continue empiric abx.   Micro/culture results:  BCx2 7/26; GNR; Biofire positive for enterobacter, klebsiella.  UC possible contaminant.  Sputum--  Antibiotics: Vancomycin 7/27>>7/28 Cefepime 7/27>>  ENDOCRINE Glucose adequately controlled.      MAJOR EVENTS/TEST RESULTS:       ---------------------------------------  ---------------------------------------   Name: Sandy [name] MRN: [id number] DOB: [birth year]    ADMISSION DATE:  11/07/2015 CONSULTATION DATE:  11/08/15  REFERRING MD :  Dr. Manuella Ghazi.   CHIEF COMPLAINT:  Abd pain/confusion    HISTORY OF PRESENT ILLNESS:   Patient is a 70 year old female with a histo of liver metastasis, recently on chemo. She is not able to provide history therefore history was obtained from chart, and staff. She was in the hospital Recently discharged On July 11, discharged after a [facility] admission. She was noted to have afib with RVR, neutropenic fever. She was [facility] to [facility]. Since that time family has noted issues with progressive weakness  and confusion with diminished mental status.  She presented to ER on 7/25 with left abdominal pain, CT abdomen showed no acute change. She was discharged from the ER.  She was seen in the ER on 11/06/2015 with altered mental status (somnolence).  Head CT was negative.  CXR was negative.  Labs included a creatinine of 1.23.  CBC revealed a hematocrit of 26.3, hemoglobin 8.7, WBC was 500 with an ANC of 100.  Her family brought her home. Daughter (medical power of attorney) opted to take pt home instead of back to [facility].  Subsequently she had a black BM and  was seen on 7/27 in cancer center, she was noted to be neutropenic, with AMS; she was then directly admitted to stepdown unit in ICU.   Since her arrival here she has been awake but not oriented.     PAST MEDICAL HISTORY :  Past Medical History:  Diagnosis Date  . Cancer ([place])    liver mets; colon cancer  . Constipation   . Diabetes mellitus without complication ([place])   . HOH (hard of hearing)   . Hypertension   . Insomnia   . 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 Sickle cell anemia (HCC)   . Wears dentures    partial upper and lower   Past Surgical History:  Procedure Laterality Date  . CARDIAC CATHETERIZATION    . COLONOSCOPY WITH PROPOFOL N/A 09/07/2014   Procedure: COLONOSCOPY WITH PROPOFOL;  Surgeon: Lucilla Lame, MD;  Location: [facility] ENDOSCOPY;  Service: Endoscopy;  Laterality: N/A;  . ESOPHAGOGASTRODUODENOSCOPY N/A 09/07/2014   Procedure: ESOPHAGOGASTRODUODENOSCOPY (  EGD);  Surgeon: Lucilla Lame, MD;  Location[facility] ENDOSCOPY;  Service: Endoscopy;  Laterality: N/A;  . FLEXIBLE SIGMOIDOSCOPY N/A 09/02/2015   Procedure: FLEXIBLE SIGMOIDOSCOPY;  Surgeon: Lucilla Lame, MD;  Location: [facility];  Service: Endoscopy;  Laterality: N/A;  Diabetic - oral meds Pt has port-a-cath  . LIVER BIOPSY    . PORTACATH PLACEMENT Left 09/24/2014   Procedure: INSERTION PORT-A-CATH;  Surgeon: Robert Bellow, MD;  Location:  [facility] ORS;  Service: General;  Laterality: Left;   Prior to Admission medications   Medication Sig Start Date End Date Taking? Authorizing Provider  amLODipine (NORVASC) 5 MG tablet Take 5 mg by mouth daily. Reported on 09/24/2015    Historical Provider, MD  aspirin EC 81 MG tablet Take 81 mg by mouth daily. Reported on 08/26/2015    Historical Provider, MD  atorvastatin (LIPITOR) 40 MG tablet Take 40 mg by mouth daily.    Historical Provider, MD  cefUROXime (CEFTIN) 250 MG tablet Take 1 tablet (250 mg total) by mouth 2 (two) times daily with a meal. 10/22/15   Vaughan Basta, MD  diltiazem (CARDIZEM) 60 MG tablet Take 1 tablet (60 mg total) by mouth every 8 (eight) hours. 10/22/15   Vaughan Basta, MD  docusate sodium (COLACE) 100 MG capsule Take 1 capsule (100 mg total) by mouth 2 (two) times daily. 10/22/15   Vaughan Basta, MD  feeding supplement, ENSURE ENLIVE, (ENSURE ENLIVE) LIQD Take 237 mLs by mouth 2 (two) times daily between meals. 10/22/15   Vaughan Basta, MD  gabapentin (NEURONTIN) 100 MG capsule Take 2 capsules (200 mg total) by mouth at bedtime. 1 tab daily and 2 tabs at bedtime. 07/16/15   Lequita Asal, MD  lidocaine-prilocaine (EMLA) cream Apply 1 application topically as needed. Apply to port 1 hour prior to chemotherapy appointment. Cover with plastic wrap. 07/08/15   Lequita Asal, MD  lisinopril (PRINIVIL,ZESTRIL) 10 MG tablet Take 10 mg by mouth daily.    Historical Provider, MD  magnesium 30 MG tablet Take 30 mg by mouth daily.     Historical Provider, MD  megestrol (MEGACE) 400 MG/10ML suspension Take 5 mLs (200 mg total) by mouth daily. to stimulate appetite 11/06/14   Lequita Asal, MD  metFORMIN (GLUCOPHAGE) 1000 MG tablet Take 1,000 mg by mouth 2 (two) times daily.     Historical Provider, MD  Multiple Vitamin (MULTIVITAMIN WITH MINERALS) TABS tablet Take 1 tablet by mouth daily.    Historical Provider, MD  ondansetron (ZOFRAN) 4 MG  tablet Take 1 tablet (4 mg total) by mouth every 8 (eight) hours as needed for nausea or vomiting. 12/13/14   Lequita Asal, MD  oxyCODONE (OXY IR/ROXICODONE) 5 MG immediate release tablet Take 1 tablet (5 mg total) by mouth every 6 (six) hours as needed for severe pain. 10/22/15   Vaughan Basta, MD  traZODone (DESYREL) 50 MG tablet Take 0.5 tablets (25 mg total) by mouth at bedtime as needed for sleep. 10/22/15   Vaughan Basta, MD   No Known Allergies  FAMILY HISTORY:  Family History  Problem Relation Age of Onset  . Cancer Sister    SOCIAL HISTORY:  reports that she quit smoking about 2 years ago. Her smoking use included Cigarettes. She has a 5.00 pack-year smoking history. She has never used smokeless tobacco. She reports that she does not drink alcohol or use drugs.  REVIEW OF SYSTEMS:   Drains patient is currentlyconfused.   VITAL SIGNS: Temp:  [97.8 F (  36.6 C)-100.5 F (38.1 C)] 98.8 F (37.1 C) (07/28 0800) Pulse Rate:  [70-91] 73 (07/28 0800) Resp:  [13-23] 17 (07/28 0900) BP: (103-141)/(57-76) 137/70 (07/28 0900) SpO2:  [94 %-100 %] 99 % (07/28 0800) Weight:  [134 lb 7.7 oz (61 kg)-143 lb (64.9 kg)] 134 lb 7.7 oz (61 kg) (07/27 1400) HEMODYNAMICS:   VENTILATOR SETTINGS:   INTAKE / OUTPUT:  Intake/Output Summary (Last 24 hours) at 11/08/15 1056 Last data filed at 11/08/15 0900  Gross per 24 hour  Intake          1693.33 ml  Output                0 ml  Net          1693.33 ml    Physical Examination:   VS: BP 137/70 (BP Location: Right Arm)   Pulse 73   Temp 98.8 F (37.1 C)   Resp 17   Ht 5\' 6"  (1.676 m)   Wt 134 lb 7.7 oz (61 kg)   SpO2 99%   BMI 21.71 kg/m   General Appearance: No distress  Neuro:without focal findings, mental status HEENT: PERRLA, EOM intact, no ptosis, no other lesions noticed;  Pulmonary: normal breath sounds., diaphragmatic excursion normal. CardiovascularNormal S1,S2.  No m/r/g.    Abdomen: Benign, Soft,  non-tender, No masses, hepatosplenomegaly, No lymphadenopathy Renal:  No costovertebral tenderness  GU:  Not performed at this time. Endoc: No evident thyromegaly, no signs of acromegaly. Skin:   warm, no rashes, no ecchymosis  Extremities: normal, no cyanosis, clubbing, no edema, warm with normal capillary refill.    LABS: Reviewed   LABORATORY PANEL:   CBC  Recent Labs Lab 11/08/15 0611  WBC 0.2*  HGB 6.5*  HCT 19.8*  PLT 66*    Chemistries   Recent Labs Lab 11/07/15 1325 11/08/15 0611  NA 137 141  K 4.9 3.6  CL 109 115*  CO2 19* 20*  GLUCOSE 135* 146*  BUN 19 15  CREATININE 1.43* 1.00  CALCIUM 9.5 9.1  AST 17  --   ALT 9*  --   ALKPHOS 97  --   BILITOT 0.7  --     No results for input(s): GLUCAP in the last 168 hours. No results for input(s): PHART, PCO2ART, PO2ART in the last 168 hours.  Recent Labs Lab 11/05/15 1257 11/06/15 1924 11/07/15 1325  AST 16 13* 17  ALT 8* 8* 9*  ALKPHOS 88 84 97  BILITOT 0.6 0.6 0.7  ALBUMIN 2.9* 2.9* 2.9*    Cardiac Enzymes  Recent Labs Lab 11/06/15 1924  TROPONINI <0.03    RADIOLOGY:  Ct Head Wo Contrast  Result Date: 11/06/2015 CLINICAL DATA:  Altered mental status. Patient is currently being treated for colon cancer. EXAM: CT HEAD WITHOUT CONTRAST TECHNIQUE: Contiguous axial images were obtained from the base of the skull through the vertex without intravenous contrast. COMPARISON:  10/18/2015 FINDINGS: Brain: Mild atrophy with sulcal prominence and centralized volume loss with mild commensurate ex vacuo dilatation of the ventricular system, asymmetrically involving the anterior horns of the bilateral lateral ventricles. Minimal periventricular hypodensities compatible microvascular ischemic disease. Right basilar ganglial calcifications. The gray-white differentiation is otherwise well maintained without CT evidence of acute large territory infarct. No intraparenchymal or extra-axial mass or hemorrhage.  Normal configuration of the ventricles and basilar cisterns. No midline shift. Vascular: Intracranial atherosclerosis Skull: No displaced calvarial fracture. Sinuses/Orbits: Limited visualization of the paranasal sinuses and mastoid air cells is normal. No  air-fluid levels. Other: Regional soft tissues appear normal. IMPRESSION: Mild atrophy and microvascular ischemic disease without acute intracranial process. Electronically Signed   By: Sandi Mariscal M.D.   On: 11/06/2015 19:53  Dg Chest Portable 1 View  Result Date: 11/06/2015 CLINICAL DATA:  Lethargy and shallow breathing today. History of colon cancer. EXAM: PORTABLE CHEST 1 VIEW COMPARISON:  Single-view of the chest 10/18/2015. FINDINGS: Left subclavian approach Port-A-Cath is unchanged. Lungs are clear. Heart size is upper normal. No pneumothorax or pleural effusion. IMPRESSION: No acute disease. Electronically Signed   By: Inge Rise M.D.   On: 11/06/2015 19:34      --Marda Stalker, MD.  Board Certified in Internal Medicine, Pulmonary Medicine, [place], and Sleep Medicine.  ICU Pager [phone] [facility] Office Number: [phone]  Patricia Pesa, M.D.  Vilinda Boehringer, M.D.  Merton Border, M.D   11/08/2015, 10:56 AM   [place].  I have personally obtained a history, examined the patient, evaluated laboratory and imaging results, formulated the assessment and plan and placed orders. The Patient requires high complexity decision making for assessment and support, frequent evaluation and titration of therapies, application of advanced monitoring technologies and extensive interpretation of multiple databases. The patient has critical illness that could lead imminently to failure of 1 or more organ systems and requires the highest level of physician preparedness to intervene.  Critical Care Time devoted to patient care services described in this note is 35 minutes and is  exclusive of time spent in procedures.

## 2015-11-08 NOTE — Consult Note (Signed)
Reason for Consult: Atrial fibrillation rapid ventricular response Referring Physician: Dr. Manuella Ghazi hospitalist  Sandy [name] is an 70 y.o. female.  HPI: Patient was found to be in rapid atrial fibrillation she was evaluated was seen by primary physician who then recommended patient be admitted for altered mental status. Patient has known history of stage IV colon cancer with metastases she has neutropenia. Had been seen in the emergency room on July 25 as well as July 26 for evaluation of altered mental status no fever was noted that white count was low at 500 patient was nonverbal family accompanied her. Patient recently had chemotherapy within the last 10 days. Developed rapid atrial fibrillation recently unclear if the patient has any pain palpitations or tachycardia.  Past Medical History:  Diagnosis Date  . Cancer ([place])    liver mets; colon cancer  . Constipation   . Diabetes mellitus without complication ([place])   . HOH (hard of hearing)   . Hypertension   . Insomnia   . 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 Sickle cell anemia (HCC)   . Wears dentures    partial upper and lower    Past Surgical History:  Procedure Laterality Date  . CARDIAC CATHETERIZATION    . COLONOSCOPY WITH PROPOFOL N/A 09/07/2014   Procedure: COLONOSCOPY WITH PROPOFOL;  Surgeon: Lucilla Lame, MD;  Location: [facility] ENDOSCOPY;  Service: Endoscopy;  Laterality: N/A;  . ESOPHAGOGASTRODUODENOSCOPY N/A 09/07/2014   Procedure: ESOPHAGOGASTRODUODENOSCOPY (EGD);  Surgeon: Lucilla Lame, MD;  [facility] ENDOSCOPY;  Service: Endoscopy;  Laterality: N/A;  . FLEXIBLE SIGMOIDOSCOPY N/A 09/02/2015   Procedure: FLEXIBLE SIGMOIDOSCOPY;  Surgeon: Lucilla Lame, MD;  Location: [place];  Service: Endoscopy;  Laterality: N/A;  Diabetic - oral meds Pt has port-a-cath  . LIVER BIOPSY    . PORTACATH PLACEMENT Left 09/24/2014   Procedure: INSERTION PORT-A-CATH;  Surgeon: Robert Bellow, MD;  Location:  [facility] ORS;  Service: General;  Laterality: Left;    Family History  Problem Relation Age of Onset  . Cancer Sister     Social History:  reports that she quit smoking about 2 years ago. Her smoking use included Cigarettes. She has a 5.00 pack-year smoking history. She has never used smokeless tobacco. She reports that she does not drink alcohol or use drugs.  Allergies: No Known Allergies  Medications: I have reviewed the patient's current medications.  Results for orders placed or performed during the hospital encounter of 11/07/15 (from the past 48 hour(s))  Ammonia     Status: None   Collection Time: 11/07/15  4:16 PM  Result Value Ref Range   Ammonia 11 9 - 35 umol/L  Basic metabolic panel     Status: Abnormal   Collection Time: 11/08/15  6:11 AM  Result Value Ref Range   Sodium 141 135 - 145 mmol/L   Potassium 3.6 3.5 - 5.1 mmol/L   Chloride 115 (H) 101 - 111 mmol/L   CO2 20 (L) 22 - 32 mmol/L   Glucose, Bld 146 (H) 65 - 99 mg/dL   BUN 15 6 - 20 mg/dL   Creatinine, Ser 1.00 0.44 - 1.00 mg/dL   Calcium 9.1 8.9 - 10.3 mg/dL   GFR calc non Af Amer 55 (L) >60 mL/min   GFR calc Af Amer >60 >60 mL/min    Comment: (NOTE) The eGFR has been calculated using the CKD EPI equation. This calculation has not been validated in all clinical situations.  eGFR's persistently <60 mL/min signify possible Chronic Kidney Disease.    Anion gap 6 5 - 15  CBC     Status: Abnormal   Collection Time: 11/08/15  6:11 AM  Result Value Ref Range   WBC 0.2 (LL) 3.6 - 11.0 K/uL    Comment: CRITICAL RESULT CALLED TO, READ BACK BY AND VERIFIED WITH: RENEE BABB AT 5462 ON 11/08/15.Marland KitchenMarland KitchenMent[place]    RBC 2.26 (L) 3.80 - 5.20 MIL/uL   Hemoglobin 6.5 (L) 12.0 - 16.0 g/dL   HCT 19.8 (L) 35.0 - 47.0 %   MCV 87.6 80.0 - 100.0 fL   MCH 28.7 26.0 - 34.0 pg   MCHC 32.7 32.0 - 36.0 g/dL   RDW 17.2 (H) 11.5 - 14.5 %   Platelets 66 (L) 150 - 440 K/uL  Glucose, capillary     Status: Abnormal   Collection Time: 11/08/15  11:50 AM  Result Value Ref Range   Glucose-Capillary 107 (H) 65 - 99 mg/dL    Ct Head Wo Contrast  Result Date: 11/06/2015 CLINICAL DATA:  Altered mental status. Patient is currently being treated for colon cancer. EXAM: CT HEAD WITHOUT CONTRAST TECHNIQUE: Contiguous axial images were obtained from the base of the skull through the vertex without intravenous contrast. COMPARISON:  10/18/2015 FINDINGS: Brain: Mild atrophy with sulcal prominence and centralized volume loss with mild commensurate ex vacuo dilatation of the ventricular system, asymmetrically involving the anterior horns of the bilateral lateral ventricles. Minimal periventricular hypodensities compatible microvascular ischemic disease. Right basilar ganglial calcifications. The gray-white differentiation is otherwise well maintained without CT evidence of acute large territory infarct. No intraparenchymal or extra-axial mass or hemorrhage. Normal configuration of the ventricles and basilar cisterns. No midline shift. Vascular: Intracranial atherosclerosis Skull: No displaced calvarial fracture. Sinuses/Orbits: Limited visualization of the paranasal sinuses and mastoid air cells is normal. No air-fluid levels. Other: Regional soft tissues appear normal. IMPRESSION: Mild atrophy and microvascular ischemic disease without acute intracranial process. Electronically Signed   By: Sandi Mariscal M.D.   On: 11/06/2015 19:53  Dg Chest Portable 1 View  Result Date: 11/06/2015 CLINICAL DATA:  Lethargy and shallow breathing today. History of colon cancer. EXAM: PORTABLE CHEST 1 VIEW COMPARISON:  Single-view of the chest 10/18/2015. FINDINGS: Left subclavian approach Port-A-Cath is unchanged. Lungs are clear. Heart size is upper normal. No pneumothorax or pleural effusion. IMPRESSION: No acute disease. Electronically Signed   By: Inge Rise M.D.   On: 11/06/2015 19:34   Review of Systems  Unable to perform ROS: Mental status change   Psychiatric/Behavioral:       Altered mental status patient unable to cooperate   Blood pressure 117/84, pulse (!) 144, temperature 99.1 F (37.3 C), temperature source Axillary, resp. rate (!) 28, height _0  (1.676 m), weight 61 kg (134 lb 7.7 oz), SpO2 96 %. Physical Exam  Nursing note and vitals reviewed. Constitutional: She appears lethargic.  HENT:  Head: Normocephalic and atraumatic.  Eyes: Conjunctivae and EOM are normal. Pupils are equal, round, and reactive to light.  Neck: Normal range of motion. Neck supple.  Cardiovascular: S1 normal and S2 normal.  An irregularly irregular rhythm present. Tachycardia present.   Murmur heard.  Systolic murmur is present with a grade of 2/6  Respiratory: Effort normal and breath sounds normal.  GI: She exhibits distension.  Musculoskeletal: Normal range of motion.  Neurological: She appears lethargic. She exhibits abnormal muscle tone.  Patient unable to answer questions because of altered mental status  Assessment/Plan: Atrial fibrillation rapid ventricular response Altered mental status Neutropenia Colon cancer with metastatic disease stage IV Status post chemotherapy Hypertension Dehydration . PLAN Recommend fluid hydration Agree with ICU care Agree with Cardizem IV drip Recommend intermittent metoprolol IV Add digoxin IV to help with rate control Do not recommend further diagnostic studies Do not recommend long-term anticoagulation Consider palliative care consult  Laveta Gilkey D. 11/08/2015, 1:33 PM

## 2015-11-08 NOTE — Progress Notes (Signed)
Pharmacy Antibiotic Note  Sandy [name] is a 70 y.o. female admitted on 11/07/2015 with febrile neutropenia.  Pharmacy has been consulted for vancomycin and cefepime dosing. Blood cx with GNR, BCID Klebsiella pneumoniae.   Plan: Continue cefepime 2 g iv q 12 hours.    Height: 5\' 6"  (167.6 cm) Weight: 134 lb 7.7 oz (61 kg) IBW/kg (Calculated) : 59.3  Temp (24hrs), Avg:99 F (37.2 C), Min:97.8 F (36.6 C), Max:100.5 F (38.1 C)   Recent Labs Lab 11/05/15 1007 11/05/15 1257 11/05/15 1258 11/06/15 1924 11/07/15 1156 11/07/15 1325 11/08/15 0611  WBC 1.1* 0.8*  --  0.5* 0.3*  --  0.2*  CREATININE  --  1.39*  --  1.23*  --  1.43* 1.00  LATICACIDVEN  --   --  1.4  --   --   --   --     Estimated Creatinine Clearance: 48.3 mL/min (by C-G formula based on SCr of 1 mg/dL).    No Known Allergies  Antimicrobials this admission: vancomycin 7/27 >> 7/28 cefepime 7/27 >>   Dose adjustments this admission:   Microbiology results: 7/25 UCx: mx species 7/27 BCx: GNR 2/2, BCID Klebsiella pneumoniae  Thank you for allowing pharmacy to be a part of this patient's care.  [name] 11/08/2015 11:43 AM

## 2015-11-08 NOTE — Progress Notes (Signed)
Initial Nutrition Assessment  DOCUMENTATION CODES:   Not applicable  INTERVENTION:  -Await diet progression  -Ensure Enlive supplementation once diet advanced  NUTRITION DIAGNOSIS:   Inadequate oral intake related to lethargy/confusion, acute illness as evidenced by NPO status.  GOAL:   Patient will meet greater than or equal to 90% of their needs  MONITOR:   Diet advancement, Labs, Weight trends  REASON FOR ASSESSMENT:   Malnutrition Screening Tool    ASSESSMENT:   70 yo female admitted with AMS, neutropenia, melatonic stools. Pt with stage IV colon cancer with mets to liver receiving chemotheray; last treatment 7/21. Pt with previous admission this month for neutropenic fever   Oncology and palliative care consults pending. Encephalopathy continues, MRI brain pending for today. Pt currently nonverbal, not alert enough to safely take po meds or meal trays, remains NPO.  Unable to complete Nutrition-Focused physical exam at this time.    Past Medical History:  Diagnosis Date  . Cancer ([place])    liver mets; colon cancer  . Constipation   . Diabetes mellitus without complication ([place])   . HOH (hard of hearing)   . Hypertension   . Insomnia   . 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 Sickle cell anemia (HCC)   . Wears dentures    partial upper and lower     Diet Order:  Diet NPO time specified Except for: Sips with Meds  Skin:  Reviewed, no issues  Last BM:  7/27   Labs: Hbg 6.5, WBC 0.2  Meds: NS at 100 ml/hr  Height:   Ht Readings from Last 1 Encounters:  11/07/15 5\' 6"  (1.676 m)    Weight: 6.2% wt loss in <1 month per wt encounters  Wt Readings from Last 1 Encounters:  11/07/15 134 lb 7.7 oz (61 kg)   Wt Readings from Last 10 Encounters:  11/07/15 134 lb 7.7 oz (61 kg)  11/07/15 143 lb (64.9 kg)  11/06/15 143 lb (64.9 kg)  11/05/15 143 lb (64.9 kg)  10/29/15 146 lb 6.2 oz (66.4 kg)  10/22/15 143 lb 3.2 oz (65 kg)   10/14/15 144 lb 11.7 oz (65.6 kg)  10/08/15 149 lb 2.3 oz (67.7 kg)  10/07/15 145 lb (65.8 kg)  09/24/15 148 lb 9.4 oz (67.4 kg)    BMI:  Body mass index is 21.71 kg/m.  Estimated Nutritional Needs:   Kcal:  [id number] kcals   Protein:  73-92 g  Fluid:  >/= 1.8 L  EDUCATION NEEDS:   No education needs identified at this time  [place], [place], [place] [phone] Pager  [phone] Weekend/On-Call Pager

## 2015-11-08 NOTE — Care Management (Addendum)
Patient was sleeping when I rounded today. Contact number on her white board Janus Molder [phone]). Message was left for family to call this RNCM. Patient went to [facility] this month but appears to be from home now. She was open to [facility] home health they have since closed her out 10/17/15- I question this information which is why I have contacted family to confirm and determine need at discharge. RNCM will continue to follow. I understand that patient wants to be left alone and doesn't talk too much either. She was sent from [facility] to hospital.  Received callback from patient's daughter and Karren Cobble [phone]. Patient was at [facility] for rehab and then home with [facility] home health however she is not receiving any home health now. Per [name] she is uncertain what patient will need at discharge and wasn't ready to talk about it. She has this RNCM contact card and a list of home health providers at bedside.

## 2015-11-08 NOTE — Progress Notes (Signed)
Contacted Dr.Callwood per previous verbal orders, to inform of pt's HR rate continually increasing past 160. Non sustaining. Pt HR is now sustaining in 120's-130's.  New verbal orders given for Digoxin 0.5 mg now, four hours from now give 0.25 mg digoxin once, four hours after that give 0.25mg  digoxin.  Starting tomorrow am give 0.125 mg digoxin daily. Verbal read back. Acknowledged orders.  Will continue to assess.

## 2015-11-08 NOTE — Progress Notes (Signed)
PHARMACY - PHYSICIAN COMMUNICATION CRITICAL VALUE ALERT - BLOOD CULTURE IDENTIFICATION (BCID)  No results found for this or any previous visit.   [employer] returned K. Pneumoniae, KPC (-). Patient is currently on vancomycin and cefepime for neutropenia. Will d/c vanc per conversation with hospitalist. Cefepime left on board since it is indicated for neutropenia.  Name of physician (or Provider) Contacted: Diamind  Changes to prescribed antibiotics required: vancomycin d/c  Antonin Meininger S 11/08/2015  6:48 AM

## 2015-11-08 NOTE — Progress Notes (Signed)
Spoke with pts daughter and grandson discussed overall prognosis and code status.  After discussions family would like code status to be changed to DNR.   Marda Stalker, [place] Pager [phone] (please enter 7 digits) PCCM Consult Pager [phone] (please enter 7 digits)

## 2015-11-08 NOTE — Progress Notes (Signed)
Per MD [place]wood aDMINISTER 5 mgs IV lopressor Q5M x 3 doses for SVT/rapid AFib.  RThis RN and A bERRY rn IN ROOM

## 2015-11-08 NOTE — Progress Notes (Signed)
[facility] at [place] NAME: Sandy [name]    MR#:  [id number]  DATE OF BIRTH:  July 18, 1945  SUBJECTIVE:  CHIEF COMPLAINT:  No chief complaint on file. lethargic, can't participate much in talking, HR remains 140-150s REVIEW OF SYSTEMS:  Review of Systems  Unable to perform ROS: Critical illness   DRUG ALLERGIES:  No Known Allergies VITALS:  Blood pressure 117/84, pulse (!) 144, temperature 99.1 F (37.3 C), temperature source Axillary, resp. rate (!) 28, height 5\' 6"  (1.676 m), weight 61 kg (134 lb 7.7 oz), SpO2 96 %. PHYSICAL EXAMINATION:  Physical Exam  Constitutional: She is oriented to person, place, and time and well-developed, well-nourished, and in no distress. She appears lethargic. She appears unhealthy. She appears toxic. She has a sickly appearance.  HENT:  Head: Normocephalic and atraumatic.  Eyes: Conjunctivae and EOM are normal. Pupils are equal, round, and reactive to light.  Neck: Normal range of motion. Neck supple. No tracheal deviation present. No thyromegaly present.  Cardiovascular: Normal rate, regular rhythm and normal heart sounds.   Pulmonary/Chest: Effort normal and breath sounds normal. No respiratory distress. She has no wheezes. She exhibits no tenderness.  Abdominal: Soft. Bowel sounds are normal. She exhibits no distension. There is no tenderness.  Musculoskeletal: Normal range of motion.  Neurological: She is oriented to person, place, and time. She appears lethargic. No cranial nerve deficit.  drowsy, Opens eyes to stimuli, and goes back to sleep. she moves limbs slowly to stimuli.  Skin: Skin is warm and dry. No rash noted.  Psychiatric: [name] and affect normal.  Nursing note and vitals reviewed.  LABORATORY PANEL:   CBC  Recent Labs Lab 11/08/15 0611  WBC 0.2*  HGB 6.5*  HCT 19.8*  PLT 66*    ------------------------------------------------------------------------------------------------------------------ Chemistries   Recent Labs Lab 11/07/15 1325 11/08/15 0611  NA 137 141  K 4.9 3.6  CL 109 115*  CO2 19* 20*  GLUCOSE 135* 146*  BUN 19 15  CREATININE 1.43* 1.00  CALCIUM 9.5 9.1  AST 17  --   ALT 9*  --   ALKPHOS 97  --   BILITOT 0.7  --    RADIOLOGY:  No results found. 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 Now with altered mental status, and neutopenia with sepsis.   * Klebsiella Sepsis - present on admission - continue vanc+ cefepime  * A.fib with RVR - gave cardizem IV 10 mg and then drip - now having pause so drip stopped and cardio recommends IV metoprolol Cardio seen - echo pending  * Acute Metabolic encephalopathy: - Multifactorial - Normal Ammonia - Brain Mets? - unable to get MRI brain due to HR in 140-150s - continue IV fluids.  * Pancytopenia - likely due to chemo - She was given neulasta 11/01/15 as per oncology notes. - continue vanc+ cefepime as she may have inection as a reason of AMS.   Blood cx Growing Klebsiella  * Diabetes mellitus type 2 decreased intake due to AMS, monitor with SCC.  * Metastatic colon cancer getting chemotherapy Presented with the neutropenia, chemotherapy was last week. Oncology consult.    Palliative care consult.  * Essential hypertension Controlled, hold oral meds for now.  * Ac renal insufficiency   Due to dehydration Resolved with IV fluid     All the records are reviewed and case discussed with Care Management/Social Worker.  Management plans discussed with the patient, family and they are in agreement.  CODE STATUS: FULL CODE  TOTAL TIME (Critical care) TAKING CARE OF THIS PATIENT: 35 minutes.   More than 50% of the time was spent in counseling/coordination of care: Sandy [name], Sandy [name] M.D on 11/08/2015 at 2:12  PM  Between 7am to 6pm - Pager - [phone]  After 6pm go to [email or web address] - Proofreader  [facility]  Office  [phone]  CC: Primary care physician; [name], MD  Note: This dictation was prepared with Dragon dictation along with smaller phrase technology. Any transcriptional errors that result from this process are unintentional.

## 2015-11-08 NOTE — Progress Notes (Signed)
Patient not sufficiently alert this morning to take PO medications.

## 2015-11-08 NOTE — Progress Notes (Signed)
Spoke w/ Ms. Tukov NP r/t to holding pt.'s digoxin 2100 dose. Pt. Converted to NSR around 1800 and current HR in 60's. NP ordered to convert amio gtt to 30 mg now, and approved holding dig dose. Will continue to monitor pt. Closely.

## 2015-11-08 NOTE — Progress Notes (Signed)
Patient's primary cardiologist is Nehemiah Massed. She has seen Dr. Ubaldo Glassing already this month for same issue on 10/20/2015. ICU notified to call [facility].

## 2015-11-08 NOTE — Clinical Social Work Note (Signed)
CSW was consulted to inform that patient is from [facility]. CSW spoke with Broadus John at [facility] and patient is no longer a patient there. Patient was taken home from the ED by family and then was readmitted shortly afterwards. Patient is from home. Shela Leff MSW,LCSW [phone]

## 2015-11-09 ENCOUNTER — Inpatient Hospital Stay: Payer: Medicare Other

## 2015-11-09 DIAGNOSIS — R410 Disorientation, unspecified: Secondary | ICD-10-CM

## 2015-11-09 DIAGNOSIS — D61810 Antineoplastic chemotherapy induced pancytopenia: Secondary | ICD-10-CM

## 2015-11-09 DIAGNOSIS — D702 Other drug-induced agranulocytosis: Secondary | ICD-10-CM

## 2015-11-09 DIAGNOSIS — T451X5A Adverse effect of antineoplastic and immunosuppressive drugs, initial encounter: Secondary | ICD-10-CM

## 2015-11-09 DIAGNOSIS — D61818 Other pancytopenia: Secondary | ICD-10-CM

## 2015-11-09 DIAGNOSIS — Z809 Family history of malignant neoplasm, unspecified: Secondary | ICD-10-CM

## 2015-11-09 DIAGNOSIS — Z87891 Personal history of nicotine dependence: Secondary | ICD-10-CM

## 2015-11-09 LAB — TYPE AND SCREEN
ABO/RH(D): O POS
ANTIBODY SCREEN: NEGATIVE
Unit division: 0

## 2015-11-09 LAB — CBC WITH DIFFERENTIAL/PLATELET
BLASTS: 0 %
Band Neutrophils: 0 %
Basophils Absolute: 0 10*3/uL (ref 0–0.1)
Basophils Relative: 0 %
Eosinophils Absolute: 0 10*3/uL (ref 0–0.7)
Eosinophils Relative: 4 %
HEMATOCRIT: 21.6 % — AB (ref 35.0–47.0)
HEMOGLOBIN: 7.3 g/dL — AB (ref 12.0–16.0)
LYMPHS PCT: 79 %
Lymphs Abs: 0.3 10*3/uL — ABNORMAL LOW (ref 1.0–3.6)
MCH: 29 pg (ref 26.0–34.0)
MCHC: 33.6 g/dL (ref 32.0–36.0)
MCV: 86.2 fL (ref 80.0–100.0)
MONOS PCT: 5 %
Metamyelocytes Relative: 0 %
Monocytes Absolute: 0 10*3/uL — ABNORMAL LOW (ref 0.2–0.9)
Myelocytes: 0 %
NEUTROS ABS: 0 10*3/uL — AB (ref 1.4–6.5)
NEUTROS PCT: 12 %
OTHER: 0 %
PROMYELOCYTES ABS: 0 %
Platelets: 56 10*3/uL — ABNORMAL LOW (ref 150–440)
RBC: 2.5 MIL/uL — AB (ref 3.80–5.20)
RDW: 16.6 % — ABNORMAL HIGH (ref 11.5–14.5)
WBC: 0.3 10*3/uL — AB (ref 3.6–11.0)
nRBC: 0 /100 WBC

## 2015-11-09 LAB — GLUCOSE, CAPILLARY
GLUCOSE-CAPILLARY: 102 mg/dL — AB (ref 65–99)
GLUCOSE-CAPILLARY: 133 mg/dL — AB (ref 65–99)
GLUCOSE-CAPILLARY: 147 mg/dL — AB (ref 65–99)
Glucose-Capillary: 104 mg/dL — ABNORMAL HIGH (ref 65–99)
Glucose-Capillary: 119 mg/dL — ABNORMAL HIGH (ref 65–99)
Glucose-Capillary: 130 mg/dL — ABNORMAL HIGH (ref 65–99)
Glucose-Capillary: 137 mg/dL — ABNORMAL HIGH (ref 65–99)

## 2015-11-09 LAB — COMPREHENSIVE METABOLIC PANEL
ALBUMIN: 2 g/dL — AB (ref 3.5–5.0)
ALK PHOS: 68 U/L (ref 38–126)
ALT: 11 U/L — AB (ref 14–54)
ANION GAP: 6 (ref 5–15)
AST: 11 U/L — ABNORMAL LOW (ref 15–41)
BILIRUBIN TOTAL: 0.8 mg/dL (ref 0.3–1.2)
BUN: 11 mg/dL (ref 6–20)
CALCIUM: 8.7 mg/dL — AB (ref 8.9–10.3)
CO2: 18 mmol/L — AB (ref 22–32)
CREATININE: 0.83 mg/dL (ref 0.44–1.00)
Chloride: 117 mmol/L — ABNORMAL HIGH (ref 101–111)
GFR calc Af Amer: >60 mL/min (ref >60)
GFR calc non Af Amer: >60 mL/min (ref >60)
GLUCOSE: 110 mg/dL — AB (ref 65–99)
Potassium: 2.9 mmol/L — ABNORMAL LOW (ref 3.5–5.1)
SODIUM: 141 mmol/L (ref 135–145)
TOTAL PROTEIN: 5.6 g/dL — AB (ref 6.5–8.1)

## 2015-11-09 LAB — MAGNESIUM: Magnesium: 1.5 mg/dL — ABNORMAL LOW (ref 1.7–2.4)

## 2015-11-09 LAB — PHOSPHORUS: Phosphorus: 1.8 mg/dL — ABNORMAL LOW (ref 2.5–4.6)

## 2015-11-09 LAB — POTASSIUM: Potassium: 3.2 mmol/L — ABNORMAL LOW (ref 3.5–5.1)

## 2015-11-09 MED ORDER — SODIUM CHLORIDE 0.9 % IV SOLN
1.0000 g | INTRAVENOUS | Status: DC
  Administered 2015-11-09 – 2015-11-10 (×2): 1 g via INTRAVENOUS
  Filled 2015-11-09 (×2): qty 1

## 2015-11-09 MED ORDER — MAGNESIUM SULFATE 2 GM/50ML IV SOLN
2.0000 g | Freq: Once | INTRAVENOUS | Status: AC
  Administered 2015-11-09: 2 g via INTRAVENOUS
  Filled 2015-11-09: qty 50

## 2015-11-09 MED ORDER — POTASSIUM CHLORIDE 10 MEQ/100ML IV SOLN
10.0000 meq | INTRAVENOUS | Status: AC
  Administered 2015-11-09 (×4): 10 meq via INTRAVENOUS
  Filled 2015-11-09 (×4): qty 100

## 2015-11-09 NOTE — Progress Notes (Signed)
[facility] at [place] NAME: Sandy [name]    MR#:  [id number]  DATE OF BIRTH:  06/25/1945  SUBJECTIVE:  CHIEF COMPLAINT:  No chief complaint on file. More awake. Some confusion  REVIEW OF SYSTEMS:  Review of Systems  Unable to perform ROS: Mental status change   DRUG ALLERGIES:  No Known Allergies VITALS:  Blood pressure (!) 152/64, pulse 66, temperature 98.7 F (37.1 C), temperature source Oral, resp. rate 17, height 5\' 6"  (1.676 m), weight 61 kg (134 lb 7.7 oz), SpO2 99 %. PHYSICAL EXAMINATION:  Physical Exam  Constitutional: She is oriented to person, place, and time. She appears lethargic. She has a sickly appearance.  HENT:  Head: Normocephalic and atraumatic.  Eyes: Conjunctivae and EOM are normal. Pupils are equal, round, and reactive to light.  Neck: Normal range of motion. Neck supple. No tracheal deviation present. No thyromegaly present.  Cardiovascular: Normal rate, regular rhythm and normal heart sounds.   Pulmonary/Chest: Effort normal and breath sounds normal. No respiratory distress. She has no wheezes. She exhibits no tenderness.  Abdominal: Soft. Bowel sounds are normal. She exhibits no distension. There is no tenderness.  Musculoskeletal: Normal range of motion.  Neurological: She is oriented to person, place, and time. She appears lethargic. She displays facial symmetry. No cranial nerve deficit.  Moving all extremities  Skin: Skin is warm and dry. No rash noted.  Psychiatric: Mood and affect normal.  Nursing note and vitals reviewed.  LABORATORY PANEL:   CBC  Recent Labs Lab 11/09/15 0449  WBC 0.3*  HGB 7.3*  HCT 21.6*  PLT 56*   ------------------------------------------------------------------------------------------------------------------ Chemistries   Recent Labs Lab 11/09/15 0449  NA 141  K 2.9*  CL 117*  CO2 18*  GLUCOSE 110*  BUN 11  CREATININE 0.83  CALCIUM 8.7*  MG 1.5*  AST  11*  ALT 11*  ALKPHOS 68  BILITOT 0.8   RADIOLOGY:  Dg Chest Port 1 View  Result Date: 11/09/2015 CLINICAL DATA:  Acute respiratory failure EXAM: PORTABLE CHEST 1 VIEW COMPARISON:  November 06, 2015 FINDINGS: The left Port-A-Cath is stable with the tip terminating in the right side of the heart. No acute interval change. IMPRESSION: Stable Port-A-Cath.  No other changes. Electronically Signed   By: Dorise Bullion III M.D   On: 11/09/2015 07:17  ASSESSMENT AND PLAN:  70 yo female with history of stage 4 colon ca with liver mets on chemo. Now with altered mental status, and neutopenia with sepsis.   * Klebsiella bacteremia likely source is UTI - present on admission. - On cefepime. vanc stopped. Will change to Invanz till sensitivities are back.  * A.fib with RVR - Cardizem drip stopped. - Afib likely from sepsis  * Acute Metabolic encephalopathy - Multifactorial - Normal Ammonia - Brain Mets? - Most likely due to Sepsis  * Pancytopenia - Due to chemo - She was given neulasta 11/01/15 as per oncology notes.  * Diabetes mellitus type 2 decreased intake due to AMS, monitor with SCC.  * Metastatic colon cancer getting chemotherapy Presented with the neutropenia, chemotherapy was last week. Oncology input appreciated   Palliative care consult.  * Essential hypertension On cardizem  * Ac renal insufficiency   Due to dehydration Resolved with IV fluid  DVT prophylaxis SCDs due to thrombocytoepnia  Consult speech and start diet after that  All the records are reviewed and case discussed with Care Management/Social Worker. Management plans discussed with the  patient, family and they are in agreement.  CODE STATUS: FULL CODE  TOTAL TIME TAKING CARE OF THIS PATIENT: 35 minutes.   Hillary Bow R M.D on 11/09/2015 at 8:15 AM  Between 7am to 6pm - Pager - [phone]  After 6pm go to [email or web address] - Proofreader  [facility]    Office  [phone]  CC: Primary care physician; [name], MD  Note: This dictation was prepared with Dragon dictation along with smaller phrase technology. Any transcriptional errors that result from this process are unintentional.

## 2015-11-09 NOTE — Consult Note (Signed)
[facility] Consultation     ASSESSMENT/PLAN   70 yo female with history of stage 4 colon ca with liver mets on chemo. Now with altered mental status, and neutopenia with sepsis.   NEUROLOGIC --Metabolic encephalopathy; suspect due to sepsis, better today.  --CT head negative will await MRI to r/o brain mets.   RENAL AKI-- appears better today.  --Likely due to dehydration, sepsis as well as from meds.  --Gentle hydration to continue.   GASTROINTESTINAL Abdominal pain, appears chronic, likely related to known liver mets.  --Continue pain control.   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  Neutropenia secondary to chemo.  History of sickle cell anemia; unsure that this is contributory to present condition.  --Continue abx.   CARDIOLOGY Afib with RVR.  --Currentlyweaned off cardizem drip.  INFECTIOUS Neutopenia with sepis.  Continue empiric abx.   Micro/culture results:  BCx2 7/26; GNR; Biofire positive for enterobacter, klebsiella.  UC possible contaminant.  Sputum--  Antibiotics: Vancomycin 7/27>>7/28 Cefepime 7/27>>  ENDOCRINE Glucose adequately controlled.      MAJOR EVENTS/TEST RESULTS:       ---------------------------------------  ---------------------------------------   Name: Sandy [name] MRN: [id number] DOB: 08-04-1945    ADMISSION DATE:  11/07/2015 CONSULTATION DATE:  11/08/15  Subjective: Pt is more awake but still not oriented.    Prior to Admission medications   Medication Sig Start Date End Date Taking? Authorizing Provider  amLODipine (NORVASC) 5 MG tablet Take 5 mg by mouth daily. Reported on 09/24/2015    Historical Provider, MD  aspirin EC 81 MG tablet Take 81 mg by mouth daily. Reported on 08/26/2015    Historical Provider, MD  atorvastatin (LIPITOR) 40 MG tablet Take 40 mg by mouth daily.    Historical Provider, MD  cefUROXime (CEFTIN) 250 MG tablet Take 1 tablet (250 mg total)  by mouth 2 (two) times daily with a meal. 10/22/15   Vaughan Basta, MD  diltiazem (CARDIZEM) 60 MG tablet Take 1 tablet (60 mg total) by mouth every 8 (eight) hours. 10/22/15   Vaughan Basta, MD  docusate sodium (COLACE) 100 MG capsule Take 1 capsule (100 mg total) by mouth 2 (two) times daily. 10/22/15   Vaughan Basta, MD  feeding supplement, ENSURE ENLIVE, (ENSURE ENLIVE) LIQD Take 237 mLs by mouth 2 (two) times daily between meals. 10/22/15   Vaughan Basta, MD  gabapentin (NEURONTIN) 100 MG capsule Take 2 capsules (200 mg total) by mouth at bedtime. 1 tab daily and 2 tabs at bedtime. 07/16/15   Lequita Asal, MD  lidocaine-prilocaine (EMLA) cream Apply 1 application topically as needed. Apply to port 1 hour prior to chemotherapy appointment. Cover with plastic wrap. 07/08/15   Lequita Asal, MD  lisinopril (PRINIVIL,ZESTRIL) 10 MG tablet Take 10 mg by mouth daily.    Historical Provider, MD  magnesium 30 MG tablet Take 30 mg by mouth daily.     Historical Provider, MD  megestrol (MEGACE) 400 MG/10ML suspension Take 5 mLs (200 mg total) by mouth daily. to stimulate appetite 11/06/14   Lequita Asal, MD  metFORMIN (GLUCOPHAGE) 1000 MG tablet Take 1,000 mg by mouth 2 (two) times daily.     Historical Provider, MD  Multiple Vitamin (MULTIVITAMIN WITH MINERALS) TABS tablet Take 1 tablet by mouth daily.    Historical Provider, MD  ondansetron (ZOFRAN) 4 MG tablet Take 1 tablet (4 mg total) by mouth every 8 (eight) hours as needed for nausea or vomiting. 12/13/14  Lequita Asal, MD  oxyCODONE (OXY IR/ROXICODONE) 5 MG immediate release tablet Take 1 tablet (5 mg total) by mouth every 6 (six) hours as needed for severe pain. 10/22/15   Vaughan Basta, MD  traZODone (DESYREL) 50 MG tablet Take 0.5 tablets (25 mg total) by mouth at bedtime as needed for sleep. 10/22/15   Vaughan Basta, MD   No Known Allergies  REVIEW OF SYSTEMS:   Can not be obtain as  patient is currently confused.   VITAL SIGNS: Temp:  [98.1 F (36.7 C)-99.1 F (37.3 C)] 98.7 F (37.1 C) (07/29 0400) Pulse Rate:  [55-144] 66 (07/29 0530) Resp:  [12-28] 17 (07/29 0530) BP: (116-160)/(58-107) 152/64 (07/29 0530) SpO2:  [89 %-100 %] 99 % (07/29 0530) HEMODYNAMICS:   VENTILATOR SETTINGS:   INTAKE / OUTPUT:  Intake/Output Summary (Last 24 hours) at 11/09/15 0945 Last data filed at 11/09/15 0707  Gross per 24 hour  Intake          3396.72 ml  Output                0 ml  Net          3396.72 ml    Physical Examination:   VS: BP (!) 152/64   Pulse 66   Temp 98.7 F (37.1 C) (Oral)   Resp 17   Ht 5\' 6"  (1.676 m)   Wt 134 lb 7.7 oz (61 kg)   SpO2 99%   BMI 21.71 kg/m   General Appearance: No distress  Neuro:without focal findings, mental status reduced, confused.  HEENT: PERRLA, EOM intact, no ptosis, no other lesions noticed;  Pulmonary: normal breath sounds., diaphragmatic excursion normal. CardiovascularNormal S1,S2.  No m/r/g.    Abdomen: Benign, Soft, non-tender, No masses, hepatosplenomegaly, No lymphadenopathy Renal:  No costovertebral tenderness  GU:  Not performed at this time. Endoc: No evident thyromegaly, no signs of acromegaly. Skin:   warm, no rashes, no ecchymosis  Extremities: normal, no cyanosis, clubbing, no edema, warm with normal capillary refill.    LABS: Reviewed   LABORATORY PANEL:   CBC  Recent Labs Lab 11/09/15 0449  WBC 0.3*  HGB 7.3*  HCT 21.6*  PLT 56*    Chemistries   Recent Labs Lab 11/09/15 0449  NA 141  K 2.9*  CL 117*  CO2 18*  GLUCOSE 110*  BUN 11  CREATININE 0.83  CALCIUM 8.7*  MG 1.5*  PHOS 1.8*  AST 11*  ALT 11*  ALKPHOS 68  BILITOT 0.8     Recent Labs Lab 11/08/15 1150 11/08/15 1712 11/08/15 2050 11/08/15 2356 11/09/15 0353 11/09/15 0706  GLUCAP 107* 131* 143* 101* 102* 119*   No results for input(s): PHART, PCO2ART, PO2ART in the last 168 hours.  Recent Labs Lab  11/06/15 1924 11/07/15 1325 11/09/15 0449  AST 13* 17 11*  ALT 8* 9* 11*  ALKPHOS 84 97 68  BILITOT 0.6 0.7 0.8  ALBUMIN 2.9* 2.9* 2.0*    Cardiac Enzymes  Recent Labs Lab 11/06/15 1924  TROPONINI <0.03    RADIOLOGY:  Dg Chest Port 1 View  Result Date: 11/09/2015 CLINICAL DATA:  Acute respiratory failure EXAM: PORTABLE CHEST 1 VIEW COMPARISON:  November 06, 2015 FINDINGS: The left Port-A-Cath is stable with the tip terminating in the right side of the heart. No acute interval change. IMPRESSION: Stable Port-A-Cath.  No other changes. Electronically Signed   By: Dorise Bullion III M.D   On: 11/09/2015 07:17      --  Marda Stalker, MD.  Board Certified in Internal Medicine, Pulmonary Medicine, [place], and Sleep Medicine.  ICU Pager [phone] [facility] Office Number: [phone]  Patricia Pesa, M.D.  Vilinda Boehringer, M.D.  Merton Border, M.D   11/09/2015, 9:45 AM   Critical Care Attestation.  I have personally obtained a history, examined the patient, evaluated laboratory and imaging results, formulated the assessment and plan and placed orders. The Patient requires high complexity decision making for assessment and support, frequent evaluation and titration of therapies, application of advanced monitoring technologies and extensive interpretation of multiple databases. The patient has critical illness that could lead imminently to failure of 1 or more organ systems and requires the highest level of physician preparedness to intervene.  Critical Care Time devoted to patient care services described in this note is 35 minutes and is exclusive of time spent in procedures.

## 2015-11-09 NOTE — Progress Notes (Signed)
Nutrition Follow-up  DOCUMENTATION CODES:   Not applicable  INTERVENTION:  1.  Meals/supplements; Ensure Enlive has been ordered BID.  Patient typically drinks at home.  2.  Difficulty swallowing/chewing; patient assessed by SLP who determined her to be appropriate for Dysphagia 3 diet. Question re: raw fruits. Patient currently on neutropenic precautions as ordered per MD.  Soft fruits best for Dysphagia 3 diet, but raw fruits are not prohibited with neutropenic precautions and appropriate meal prep standards currently practiced by food service staff.    NUTRITION DIAGNOSIS:   Inadequate oral intake related to lethargy/confusion as evidenced by meal completion < 50%.  GOAL:   Patient will meet greater than or equal to 90% of their needs  MONITOR:   PO intake, Supplement acceptance  REASON FOR ASSESSMENT:   Malnutrition Screening Tool    ASSESSMENT:   70 y/o female admitted with AMS, neutroenia, melena.  Patient with Stage IV colon cancer and mets to liver.  On chemo; last tx 7/21.  Previous admission in July for neutropenic fever.  RD consulted for Dysphagia 3 diet and Ensure   Full assessment completed 7/28.  Nutrition status was stable, but intake limited by AMS which has improved some today.  Daughter not at bedside during visit.    Patient being transferred to floor.   Diet Order:  DIET DYS 3 Room service appropriate? Yes with Assist; Fluid consistency: Thin  Skin:  Reviewed, no issues  Last BM:  7/27  Height:   Ht Readings from Last 1 Encounters:  11/07/15 5\' 6"  (1.676 m)    Weight:   Wt Readings from Last 1 Encounters:  11/07/15 134 lb 7.7 oz (61 kg)    Ideal Body Weight:     BMI:  Body mass index is 21.71 kg/m.  Estimated Nutritional Needs:   Kcal:  [id number] kcals   Protein:  73-92 g  Fluid:  >/= 1.8 L  EDUCATION NEEDS:   No education needs identified at this time  Brynda Greathouse, MS RD LDN Clinical Inpatient Dietitian Weekend/After  hours pager: [phone]

## 2015-11-09 NOTE — Evaluation (Signed)
Clinical/Bedside Swallow Evaluation Patient Details  Name: Sandy [name] MRN: [id number] Date of Birth: 12-Jan-1946  Today's Date: 11/09/2015 Time: SLP Start Time (ACUTE ONLY): 88 SLP Stop Time (ACUTE ONLY): 1130 SLP Time Calculation (min) (ACUTE ONLY): 60 min  Past Medical History:  Past Medical History:  Diagnosis Date  . Cancer ([place])    liver mets; colon cancer  . Constipation   . Diabetes mellitus without complication ([place])   . HOH (hard of hearing)   . Hypertension   . Insomnia   . 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 Sickle cell anemia ([facility])   . Wears dentures    partial upper and lower   Past Surgical History:  Past Surgical History:  Procedure Laterality Date  . CARDIAC CATHETERIZATION    . COLONOSCOPY WITH PROPOFOL N/A 09/07/2014   Procedure: COLONOSCOPY WITH PROPOFOL;  Surgeon: Lucilla Lame, MD;  Location: [facility] ENDOSCOPY;  Service: Endoscopy;  Laterality: N/A;  . ESOPHAGOGASTRODUODENOSCOPY N/A 09/07/2014   Procedure: ESOPHAGOGASTRODUODENOSCOPY (EGD);  Surgeon: Lucilla Lame, MD;  Location: [facility] END[facility]COPY;  Service: Endoscopy;  Laterality: N/A;  . FLEXIBLE SIGMOIDOSCOPY N/A 09/02/2015   Procedure: FLEXIBLE SIGMOIDOSCOPY;  Surgeon: Lucilla Lame, MD;  Location: [place];  Service: Endoscopy;  Laterality: N/A;  Diabetic - oral meds Pt has port-a-cath  . LIVER BIOPSY    . PORTACATH PLACEMENT Left 09/24/2014   Procedure: INSERTION PORT-A-CATH;  Surgeon: Robert Bellow, MD;  Location: [facility] ORS;  Service: General;  Laterality: Left;   HPI:  Pt is a 70 y.o. female with a known history of Stage IV colon cancer on chemotherapy, last chemotherapy a week ago comes in with fever since yesterday. Temperature was around 100 Fahrenheit at home, patient ambulate in the emergency room on 02 Fahrenheit. No abdominal pain, nausea, vomiting, diarrhea. According to patient's daughter patient had a fall 2 times day before yesterday associated with generalized  weakness, fatigue, poor by mouth intake for the past 2 days. Patient also noted to have lethargic since this morning. This morning she called ambulance because of decreased mental status, unable to even stand. Pt has been less alert, lethargic d/t suspected metabolic encephalopathy sec. to neutropenia w/ sepsis. Pt is alert today stating she feels "better". She is conversive and can answer general questions though HOH. She does require min cues for follow through w/ tasks; confusion(?). Daughter peresent after evaluation.    Assessment / Plan / Recommendation Clinical Impression  Pt appears to adequately tolerate a dysphagia 3-type diet w/ thin liquids via straw and cup w/ no immediate, overt s/s of aspiration noted during intake; min oral phase deficits noted c/b lengthier mastication time/effort - suspect d/t increased exertion of task and d/t dentition status(does not wear her upper dentures, missing dentition on bottom). Pt responded well to smaller trials, moistened well and when given time to masticate/clear, although, verbal cues and education were required. Also instructed pt to use lingual sweep and dry swallow to clear as needed d/t dentition status; alternate w/ small sips of liquids intermittently. Pt given education on need for rest breaks to avoid increased exertion and to lessen fatigue w/ meals. Recommend a Dysphagia 3-type diet w/ meats cut and moistened well; thin liquids - support w/ feeding as needed. Recommend Meds in puree - whole - at this time for safer swallowing. Dietician f/u recommended d/t medical status. Family requested a plastic utensils.     Aspiration Risk   (reduced following aspiration precautions)  Diet Recommendation  Dysphagia 3 w/ thin liquids; general aspiration precautions; assistance at meals.   Medication Administration: Whole meds with puree for easier swallowing overall   Other  Recommendations Recommended Consults:  (Dietician consult - pt drinks Ensure  at home ) Oral Care Recommendations: Oral care BID;Staff/trained caregiver to provide oral care   Follow up Recommendations  None    Frequency and Duration min 2x/week  1 week       Prognosis Prognosis for Safe Diet Advancement: Good Barriers to Reach Goals: Cognitive deficits (medical status)      Swallow Study   General Date of Onset: 11/07/15 HPI: Pt is a 70 y.o. female with a known history of Stage IV colon cancer on chemotherapy, last chemotherapy a week ago comes in with fever since yesterday. Temperature was around 100 Fahrenheit at home, patient ambulate in the emergency room on 02 Fahrenheit. No abdominal pain, nausea, vomiting, diarrhea. According to patient's daughter patient had a fall 2 times day before yesterday associated with generalized weakness, fatigue, poor by mouth intake for the past 2 days. Patient also noted to have lethargic since this morning. This morning she called ambulance because of decreased mental status, unable to even stand. Pt has been less alert, lethargic d/t suspected metabolic encephalopathy sec. to neutropenia w/ sepsis. Pt is alert today stating she feels "better". She is conversive and can answer general questions though HOH. She does require min cues for follow through w/ tasks; confusion(?). Daughter peresent after evaluation.  Type of Study: Bedside Swallow Evaluation Previous Swallow Assessment: none indicated in chart Diet Prior to this Study: Regular;Thin liquids Temperature Spikes Noted: No (wbc reduced) Respiratory Status: Room air History of Recent Intubation: No Behavior/Cognition: Alert;Cooperative;Pleasant mood;Confused;Distractible;Requires cueing (HOH) Oral Cavity Assessment: Dry Oral Care Completed by SLP: Recent completion by staff Oral Cavity - Dentition: Poor condition;Missing dentition (does wear top dentures at times per report.) Vision: Functional for self-feeding Self-Feeding Abilities: Needs assist;Needs set up Patient  Positioning: Upright in bed Baseline Vocal Quality: Normal Volitional Cough:  (Fair) Volitional Swallow: Able to elicit    Oral/Motor/Sensory Function Overall Oral Motor/Sensory Function: Within functional limits   Ice Chips Ice chips: Within functional limits Presentation: Spoon (fed; 3 trials)   Thin Liquid Thin Liquid: Within functional limits Presentation: Cup;Self Fed;Straw (supported; ~3 ozs)    Nectar Thick Nectar Thick Liquid: Not tested   Honey Thick Honey Thick Liquid: Not tested   Puree Puree: Within functional limits Presentation: Self Fed;Spoon (supported; 4 ozs)   Solid   GO   Solid: Impaired Presentation: Spoon (fed; 3 trials) Oral Phase Impairments: Impaired mastication (min) Oral Phase Functional Implications: Impaired mastication (min) Pharyngeal Phase Impairments:  (none) Other Comments: suspect d/t lacking sufficient dentition; time and moistness appeared to help oral clearing       Orinda Kenner, MS, CCC-SLP  Watson,Katherine 11/09/2015,11:53 AM

## 2015-11-09 NOTE — Progress Notes (Signed)
Sandy [name]  [phone] [phone]) [phone]  ID: MARYBELLA CRAN OB: Oct 29, 1945  MR#: [id number]  [id number]  Patient Care Team: [name], MD as PCP - General (Internal Medicine) Robert Bellow, MD (General Surgery) Lequita Asal, MD (Hematology and Oncology) Lucilla Lame, MD as Consulting Physician (Gastroenterology)  CHIEF COMPLAINT: Sandy [name] is a 70 y.o. female with stage IV colon cancer who was admitted on day 10 s/p cycle #2 FOLFIRI chemotherapy with altered mental status, melanotic stools, neutropenia and hypothermia  INTERVAL HISTORY: Patient more alert today. She remains significantly pancytopenic secondary to her most recent chemotherapy on October 29, 2015. She seems to be mildly confused still, but offers no specific complaints.  REVIEW OF SYSTEMS:   Review of Systems  Unable to perform ROS: Critical illness    As per HPI. Otherwise, a complete review of systems is negatve.  PAST MEDICAL HISTORY: Past Medical History:  Diagnosis Date  . Cancer ([place])    liver mets; colon cancer  . Constipation   . Diabetes mellitus without complication ([place])   . HOH (hard of hearing)   . Hypertension   . Insomnia   . 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 Sickle cell anemia ([facility])   . Wears dentures    partial upper and lower    PAST SURGICAL HISTORY: Past Surgical History:  Procedure Laterality Date  . CARDIAC CATHETERIZATION    . COLONOSCOPY WITH PROPOFOL N/A 09/07/2014   Procedure: COLONOSCOPY WITH PROPOFOL;  Surgeon: Lucilla Lame, MD;  Location: [facility] ENDOSCOPY;  Service: Endoscopy;  Laterality: N/A;  . ESOPHAGOGASTRODUODENOSCOPY N/A 09/07/2014   Procedure: ESOPHAGOGASTRODUODENOSCOPY (EGD);  Surgeon: Lucilla Lame, MD;  Location: [facility] ENDOSCOPY;  Service: Endoscopy;  Laterality: N/A;  . FLEXIBLE SIGMOIDOSCOPY N/A 09/02/2015   Procedure: FLEXIBLE SIGMOIDOSCOPY;  Surgeon: Lucilla Lame, MD;  Location:  [place];  Service: Endoscopy;  Laterality: N/A;  Diabetic - oral meds Pt has port-a-cath  . LIVER BIOPSY    . PORTACATH PLACEMENT Left 09/24/2014   Procedure: INSERTION PORT-A-CATH;  Surgeon: Robert Bellow, MD;  Location: [facility] ORS;  Service: General;  Laterality: Left;    FAMILY HISTORY: Family History  Problem Relation Age of Onset  . Cancer Sister        ADVANCED DIRECTIVES:    HEALTH MAINTENANCE: Social History  Substance Use Topics  . Smoking status: Former Smoker    Packs/day: 0.25    Years: 20.00    Types: Cigarettes    Quit date: 09/06/2013  . Smokeless tobacco: Never Used  . Alcohol use No     Colonoscopy:  PAP:  Bone density:  Lipid panel:  No Known Allergies  Current Facility-Administered Medications  Medication Dose Route Frequency Provider Last Rate Last Dose  . 0.9 %  sodium chloride infusion   Intravenous Continuous Vaughan Basta, MD 100 mL/hr at 11/09/15 0707    . amiodarone (NEXTERONE PREMIX) 360-4.14 MG/200ML-% (1.8 mg/mL) IV infusion  30 mg/hr Intravenous Continuous Awilda Bill, NP 16.7 mL/hr at 11/09/15 0600 30 mg/hr at 11/09/15 0600  . antiseptic oral rinse (CPC / CETYLPYRIDINIUM CHLORIDE 0.05%) solution 7 mL  7 mL Mouth Rinse q12n4p Vipul Shah, MD      . aspirin EC tablet 81 mg  81 mg Oral Daily Vaughan Basta, MD   81 mg at 11/09/15 0948  . atorvastatin (LIPITOR) tablet 40 mg  40 mg Oral Daily Vaughan Basta, MD   40 mg  at 11/09/15 0948  . chlorhexidine (PERIDEX) 0.12 % solution 15 mL  15 mL Mouth Rinse BID Max Sane, MD   15 mL at 11/09/15 0948  . digoxin (LANOXIN) 0.25 MG/ML injection 0.125 mg  0.125 mg Intravenous Daily Dwayne D Callwood, MD   0.125 mg at 11/09/15 0948  . digoxin (LANOXIN) 0.25 MG/ML injection 0.25 mg  0.25 mg Intravenous Once Dwayne D Callwood, MD      . digoxin (LANOXIN) 0.25 MG/ML injection 0.25 mg  0.25 mg Intravenous Once Dwayne D Callwood, MD      . diltiazem (CARDIZEM) 100 mg in  dextrose 5 % 100 mL (1 mg/mL) infusion  5-15 mg/hr Intravenous Titrated Max Sane, MD   Stopped at 11/08/15 1700  . diltiazem (CARDIZEM) tablet 30 mg  30 mg Oral Q6H Laverle Hobby, MD   30 mg at 11/09/15 0503  . ertapenem (INVANZ) 1 g in sodium chloride 0.9 % 50 mL IVPB  1 g Intravenous Q24H Srikar Sudini, MD      . feeding supplement (ENSURE ENLIVE) (ENSURE ENLIVE) liquid 237 mL  237 mL Oral BID BM Vaughan Basta, MD      . insulin aspart (novoLOG) injection 0-9 Units  0-9 Units Subcutaneous Q4H Laverle Hobby, MD   1 Units at 11/08/15 2119  . oxyCODONE (Oxy IR/ROXICODONE) immediate release tablet 5 mg  5 mg Oral Q6H PRN Vaughan Basta, MD      . potassium chloride 10 mEq in 100 mL IVPB  10 mEq Intravenous Q1 Hr x 4 Srikar Sudini, MD   10 mEq at 11/09/15 [id number]   Facility-Administered Medications Ordered in Other Encounters  Medication Dose Route Frequency Provider Last Rate Last Dose  . acetaminophen (TYLENOL) tablet 650 mg  650 mg Oral Once Lequita Asal, MD      . sodium chloride 0.9 % injection 10 mL  10 mL Intracatheter PRN Lequita Asal, MD   10 mL at 10/11/14 1431  . sodium chloride 0.9 % injection 10 mL  10 mL Intracatheter PRN Lequita Asal, MD   10 mL at 02/28/15 1529    OBJECTIVE: Vitals:   11/09/15 0503 11/09/15 0530  BP: (!) 143/107 (!) 152/64  Pulse:  66  Resp:  17  Temp:       Body mass index is 21.71 kg/m.    ECOG FS:0 - Asymptomatic  General: Well-developed, well-nourished, no acute distress. Eyes: Pink conjunctiva, anicteric sclera. HEENT: Normocephalic, moist mucous membranes, clear oropharnyx. Lungs: Clear to auscultation bilaterally. Heart: Regular rate and rhythm. No rubs, murmurs, or gallops. Abdomen: Soft, nontender, nondistended. No organomegaly noted, normoactive bowel sounds. Musculoskeletal: No edema, cyanosis, or clubbing. Neuro: Alert, answering all questions appropriately. Cranial nerves grossly intact. Skin: No  rashes or petechiae noted. Psych: Normal affect. Lymphatics: No cervical, calvicular, axillary or inguinal LAD.   LAB RESULTS:  Lab Results  Component Value Date   NA 141 11/09/2015   K 2.9 (L) 11/09/2015   CL 117 (H) 11/09/2015   CO2 18 (L) 11/09/2015   GLUCOSE 110 (H) 11/09/2015   BUN 11 11/09/2015   CREATININE 0.83 11/09/2015   CALCIUM 8.7 (L) 11/09/2015   PROT 5.6 (L) 11/09/2015   ALBUMIN 2.0 (L) 11/09/2015   AST 11 (L) 11/09/2015   ALT 11 (L) 11/09/2015   ALKPHOS 68 11/09/2015   BILITOT 0.8 11/09/2015   GFRNONAA >60 11/09/2015   GFRAA >60 11/09/2015    Lab Results  Component Value Date   WBC 0.3 (LL) 11/09/2015  NEUTROABS 0.0 (L) 11/09/2015   HGB 7.3 (L) 11/09/2015   HCT 21.6 (L) 11/09/2015   MCV 86.2 11/09/2015   PLT 56 (L) 11/09/2015     STUDIES: Dg Thoracic Spine 2 View  Result Date: 10/19/2015 CLINICAL DATA:  Fever and confusion since yesterday, generalized back pain since this morning, stage IV colon cancer with liver metastases EXAM: THORACIC SPINE 2 VIEWS COMPARISON:  CT chest 10/07/2015 FINDINGS: Osseous demineralization. Twelve pairs of ribs. Scattered endplate spur formation at mid to lower thoracic spine. Vertebral body heights maintained without fracture or subluxation. No definite bone destruction. 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 No acute abnormalities. Electronically Signed   By: Lavonia Dana M.D.   On: 10/19/2015 14:38   Dg Lumbar Spine 2-3 Views  Result Date: 10/19/2015 CLINICAL DATA:  Fever and confusion since yesterday, generalized back pain since this morning, stage IV colon cancer with liver metastases EXAM: LUMBAR SPINE - 2-3 VIEW COMPARISON:  CT abdomen pelvis 10/04/2015 FINDINGS: Marked osseous demineralization. Five non-rib-bearing lumbar vertebra. Disc space narrowing with endplate spur formation at L5-S1 as well as at lower thoracic spine. Vertebral body heights maintained without  fracture or subluxation. IUD projects over pelvis. Aortic atherosclerosis. Few pelvic phleboliths. IMPRESSION: Osseous demineralization with degenerative disc disease changes at lower thoracic spine and at L5-S1. No acute bony abnormalities. Electronically Signed   By: Lavonia Dana M.D.   On: 10/19/2015 14:37   Ct Head Wo Contrast  Result Date: 11/06/2015 CLINICAL DATA:  Altered mental status. Patient is currently being treated for colon cancer. EXAM: CT HEAD WITHOUT CONTRAST TECHNIQUE: Contiguous axial images were obtained from the base of the skull through the vertex without intravenous contrast. COMPARISON:  10/18/2015 FINDINGS: Brain: Mild atrophy with sulcal prominence and centralized volume loss with mild commensurate ex vacuo dilatation of the ventricular system, asymmetrically involving the anterior horns of the bilateral lateral ventricles. Minimal periventricular hypodensities compatible microvascular ischemic disease. Right basilar ganglial calcifications. The gray-white differentiation is otherwise well maintained without CT evidence of acute large territory infarct. No intraparenchymal or extra-axial mass or hemorrhage. Normal configuration of the ventricles and basilar cisterns. No midline shift. Vascular: Intracranial atherosclerosis Skull: No displaced calvarial fracture. Sinuses/Orbits: Limited visualization of the paranasal sinuses and mastoid air cells is normal. No air-fluid levels. Other: Regional soft tissues appear normal. IMPRESSION: Mild atrophy and microvascular ischemic disease without acute intracranial process. Electronically Signed   By: Sandi Mariscal M.D.   On: 11/06/2015 19:53  Ct Head Wo Contrast  Result Date: 10/18/2015 CLINICAL DATA:  Decreased mental status for several days, history of stage IV liver carcinoma with recent chemotherapy EXAM: CT HEAD WITHOUT CONTRAST TECHNIQUE: Contiguous axial images were obtained from the base of the skull through the vertex without  intravenous contrast. COMPARISON:  None. FINDINGS: Bony calvarium is intact. Mild atrophic changes are noted. Prior lacunar infarct is noted adjacent to the head of the caudate nucleus on the right inferiorly. No findings to suggest acute hemorrhage, acute infarction or space-occupying mass lesion are noted. IMPRESSION: Chronic atrophic and ischemic changes without acute abnormality. Electronically Signed   By: Inez Catalina M.D.   On: 10/18/2015 10:27   Ct Abdomen Pelvis W Contrast  Result Date: 11/05/2015 CLINICAL DATA:  Colorectal carcinoma. Stage IV colon cancer with liver metastasis. Chemotherapy ongoing. EXAM: CT ABDOMEN AND PELVIS WITH CONTRAST TECHNIQUE: Multidetector CT imaging of the abdomen and pelvis was performed using the standard protocol following bolus administration of intravenous contrast. CONTRAST:  64mL ISOVUE-300 IOPAMIDOL (ISOVUE-300) INJECTION 61% COMPARISON:  CT 10/04/2015, PET-CT 08/21/2015 FINDINGS: Lower chest: Lung bases are clear. Hepatobiliary: Again demonstrated partially calcified lesions in the LEFT and RIGHT hepatic lobe. Largest lesion in the RIGHT lateral hepatic lobe with subcapsular retraction measures approximately 6.4 by 4.2 cm compared to 6.2 by 4.0 cm on prior remeasured for no significant change. Calcified lesion in the caudate lobe measures 19 mm compared to 22 mm. Lesion in the LEFT hepatic lobe measures 12 mm (image 15, series 2) increased from 9 mm. Lesion inferior LEFT hepatic lobe measuring 10 mm image 19, series 2 not changed from 10 mm. No new lesions identified. Pancreas: Pancreas is normal. No ductal dilatation. No pancreatic inflammation. Spleen: Normal spleen Adrenals/urinary tract: Adrenal glands and kidneys are normal. The ureters and bladder normal. Stomach/Bowel: Stomach, small bowel and cecum are normal. Appendix normal. No bowel obstruction. Again demonstrated soft tissue thickening at the junction of the descending colon proximal sigmoid colon to 42  mm similar to 47 mm on prior (image 61, series 2). No bowel obstruction proximal was. Distal sigmoid colon rectum are normal. Vascular/Lymphatic: Abdominal aorta is normal caliber. There is no retroperitoneal or periportal lymphadenopathy. No pelvic lymphadenopathy. Reproductive: Uterus normal. IUD in expected location. Ovaries normal. Other: No free fluid. Musculoskeletal: No aggressive osseous lesion. IMPRESSION: 1. Overall no significant change in hepatic metastasis. Some lesions are more conspicuous but this may be due to phase of contrast or treatment response. Consider FDG PET scan to more accurately determine efficacy of therapy. 2. Fullness at the junction of the descending colon and proximal sigmoid colon similar to prior. No evidence of bowel obstruction. Electronically Signed   By: Suzy Bouchard M.D.   On: 11/05/2015 16:33  Dg Chest Port 1 View  Result Date: 11/09/2015 CLINICAL DATA:  Acute respiratory failure EXAM: PORTABLE CHEST 1 VIEW COMPARISON:  November 06, 2015 FINDINGS: The left Port-A-Cath is stable with the tip terminating in the right side of the heart. No acute interval change. IMPRESSION: Stable Port-A-Cath.  No other changes. Electronically Signed   By: Dorise Bullion III M.D   On: 11/09/2015 07:17  Dg Chest Portable 1 View  Result Date: 11/06/2015 CLINICAL DATA:  Lethargy and shallow breathing today. History of colon cancer. EXAM: PORTABLE CHEST 1 VIEW COMPARISON:  Single-view of the chest 10/18/2015. FINDINGS: Left subclavian approach Port-A-Cath is unchanged. Lungs are clear. Heart size is upper normal. No pneumothorax or pleural effusion. IMPRESSION: No acute disease. Electronically Signed   By: Inge Rise M.D.   On: 11/06/2015 19:34  Dg Chest Port 1 View  Result Date: 10/18/2015 CLINICAL DATA:  Sepsis EXAM: PORTABLE CHEST 1 VIEW COMPARISON:  10/07/2015 FINDINGS: Cardiomediastinal silhouette is stable. No infiltrate or pulmonary edema. There is left subclavian  Port-A-Cath with tip in right atrium. No pneumothorax. IMPRESSION: No active disease. Electronically Signed   By: Lahoma Crocker M.D.   On: 10/18/2015 09:54    ASSESSMENT: The patient is a 70 y.o. woman with metastatic colon cancer currently day 12 s/p cycle #2 FOLFIRI chemotherapy with Neulasta support.  She was admitted with altered mental status.  She has fever and neutropenia.  Blood culture ID panel reveals Enterobacteriaceae species and Klebsiella pneumoniae.  PLAN:    1. Oncology: Day 12 s/p cycle #2 FOLFIRI with Neulasta support. Current status of metastatic disease unknown as patient recently started on FOLFIRI. Prior to initiation of FOLFIRI, disease had progressed.   2. Hematology: Patient neutropenic on neutropenic precautions.  Patient  received Neulasta post chemotherapy.  Myelosuppression secondary to chemotherapy and consumption due to infection.   patient's total white blood cell count has increased to 0.3, but her neutrophils remained 0.0. Platelets have declined to 56. Hemoglobin is improved to 7.3. Patient received one unit of packed red blood cells yesterday. Continue to transfuse as needed. Monitor daily CBC.   3. Infectious disease: ANC 0.0. Blood culture ID panel reveals Enterobacteriaceae species and Klebsiella pneumoniae.  Emperic broad spectrum antibiotics with Cefepime + vancomycin (renal adjusted doses).  Blood cultures q 24 hours prn temp > 100.4  4. Neurology: Improved. Altered mental status likely secondary to sepsis. Ammonia is normal.  Recent head CT negative.   MRI completed this morning, results pending at time of dictation.   5. Cardiology:  Atrial fibrillation with RVR similar to last admission.  Patient received cardizem then metoprolol.  Appreciate cardiology consult.  6.  Fluids, electrolytes, nutrition: Minimal po intake x 2 days. Baseline creatinine 0.66-0.80. Recent IV contrast with CT scan + dehydration resulting in increased creatinine  (1.39). IVF. Creatinine improved to 1.0.  Follow electrolytes.  Will continue to follow.  Lloyd Huger, MD   11/09/2015 12:57 PM

## 2015-11-09 NOTE — Progress Notes (Addendum)
CRITICAL VALUE ALERT  Critical value received:  WBC 0.3  Date of notification:  11/09/15  Time of notification:  0510  Critical value read back:Yes.    Nurse who received alert:   MD notified (1st page):  Carolan Shiver NP  Time of first page:  [phone]  MD notified (2nd page):  Time of second page:  Responding MD: Carolan Shiver NP  Time MD responded:  [phone] ( NP in the unit) Pt lab values is consistent with previous results will cont to monitor at this time)

## 2015-11-10 LAB — MAGNESIUM: Magnesium: 1.7 mg/dL (ref 1.7–2.4)

## 2015-11-10 LAB — BASIC METABOLIC PANEL
Anion gap: 5 (ref 5–15)
CO2: 19 mmol/L — AB (ref 22–32)
Calcium: 8.5 mg/dL — ABNORMAL LOW (ref 8.9–10.3)
Chloride: 114 mmol/L — ABNORMAL HIGH (ref 101–111)
Creatinine, Ser: 0.63 mg/dL (ref 0.44–1.00)
GFR calc Af Amer: >60 mL/min (ref >60)
GLUCOSE: 100 mg/dL — AB (ref 65–99)
POTASSIUM: 2.7 mmol/L — AB (ref 3.5–5.1)
Sodium: 138 mmol/L (ref 135–145)

## 2015-11-10 LAB — CBC WITH DIFFERENTIAL/PLATELET
BASOS PCT: 0 %
Band Neutrophils: 2 %
Basophils Absolute: 0 10*3/uL (ref 0–0.1)
Blasts: 0 %
EOS PCT: 2 %
Eosinophils Absolute: 0 10*3/uL (ref 0–0.7)
HCT: 21.9 % — ABNORMAL LOW (ref 35.0–47.0)
Hemoglobin: 7.6 g/dL — ABNORMAL LOW (ref 12.0–16.0)
LYMPHS ABS: 0.4 10*3/uL — AB (ref 1.0–3.6)
LYMPHS PCT: 54 %
MCH: 29 pg (ref 26.0–34.0)
MCHC: 34.7 g/dL (ref 32.0–36.0)
MCV: 83.6 fL (ref 80.0–100.0)
MONO ABS: 0 10*3/uL — AB (ref 0.2–0.9)
Metamyelocytes Relative: 0 %
Monocytes Relative: 6 %
Myelocytes: 1 %
NEUTROS PCT: 33 %
NRBC: 0 /100{WBCs}
Neutro Abs: 0.3 10*3/uL — ABNORMAL LOW (ref 1.4–6.5)
OTHER: 2 %
PLATELETS: 83 10*3/uL — AB (ref 150–440)
Promyelocytes Absolute: 0 %
RBC: 2.62 MIL/uL — ABNORMAL LOW (ref 3.80–5.20)
RDW: 16.8 % — ABNORMAL HIGH (ref 11.5–14.5)
WBC: 0.7 10*3/uL — CL (ref 3.6–11.0)

## 2015-11-10 LAB — CULTURE, BLOOD (ROUTINE X 2)

## 2015-11-10 LAB — GLUCOSE, CAPILLARY
GLUCOSE-CAPILLARY: 103 mg/dL — AB (ref 65–99)
GLUCOSE-CAPILLARY: 110 mg/dL — AB (ref 65–99)
GLUCOSE-CAPILLARY: 155 mg/dL — AB (ref 65–99)
GLUCOSE-CAPILLARY: 95 mg/dL (ref 65–99)
GLUCOSE-CAPILLARY: 141 mg/dL — AB (ref 65–99)
Glucose-Capillary: 105 mg/dL — ABNORMAL HIGH (ref 65–99)
Glucose-Capillary: 97 mg/dL (ref 65–99)

## 2015-11-10 LAB — POTASSIUM: POTASSIUM: 3.8 mmol/L (ref 3.5–5.1)

## 2015-11-10 MED ORDER — DEXTROSE 5 % IV SOLN
2.0000 g | INTRAVENOUS | Status: DC
  Administered 2015-11-10 – 2015-11-11 (×2): 2 g via INTRAVENOUS
  Filled 2015-11-10 (×3): qty 2

## 2015-11-10 MED ORDER — POTASSIUM CHLORIDE 20 MEQ PO PACK
40.0000 meq | PACK | Freq: Once | ORAL | Status: AC
  Administered 2015-11-10: 40 meq via ORAL
  Filled 2015-11-10: qty 2

## 2015-11-10 MED ORDER — DIGOXIN 125 MCG PO TABS
0.1250 mg | ORAL_TABLET | Freq: Every day | ORAL | Status: DC
  Administered 2015-11-11: 0.125 mg via ORAL
  Filled 2015-11-10: qty 1

## 2015-11-10 MED ORDER — AMIODARONE HCL 200 MG PO TABS
200.0000 mg | ORAL_TABLET | Freq: Every day | ORAL | Status: DC
  Administered 2015-11-10: 200 mg via ORAL
  Filled 2015-11-10: qty 1

## 2015-11-10 MED ORDER — MEGESTROL ACETATE 400 MG/10ML PO SUSP
200.0000 mg | Freq: Every day | ORAL | Status: DC
  Administered 2015-11-11 – 2015-11-12 (×2): 200 mg via ORAL
  Filled 2015-11-10 (×2): qty 5

## 2015-11-10 MED ORDER — POTASSIUM CHLORIDE 10 MEQ/100ML IV SOLN
10.0000 meq | INTRAVENOUS | Status: AC
  Administered 2015-11-10 (×4): 10 meq via INTRAVENOUS
  Filled 2015-11-10 (×4): qty 100

## 2015-11-10 MED ORDER — POTASSIUM CHLORIDE CRYS ER 20 MEQ PO TBCR
40.0000 meq | EXTENDED_RELEASE_TABLET | Freq: Once | ORAL | Status: DC
Start: 2015-11-10 — End: 2015-11-11
  Filled 2015-11-10: qty 2

## 2015-11-10 MED ORDER — POTASSIUM CHLORIDE 10 MEQ/100ML IV SOLN: 10.0000 meq | INTRAVENOUS | Status: DC

## 2015-11-10 MED ORDER — DILTIAZEM HCL ER COATED BEADS 120 MG PO CP24
120.0000 mg | ORAL_CAPSULE | Freq: Every day | ORAL | Status: DC
  Administered 2015-11-10 – 2015-11-11 (×2): 120 mg via ORAL
  Filled 2015-11-10 (×2): qty 1

## 2015-11-10 NOTE — Progress Notes (Signed)
Daughter at bedside, refuses bed alarm while she is there. Educated on safety and need for assistance. Agrees to call for help and let us know when she leaves so we can turn the alarm back on.

## 2015-11-10 NOTE — Progress Notes (Signed)
Notified Dr. Marcille Blanco of K level of 2.7. Will continue to monitor.

## 2015-11-10 NOTE — Progress Notes (Signed)
[facility] NAME: Sandy [name]    MR#:  [id number]  DATE OF BIRTH:  11/11/1945  SUBJECTIVE:  CHIEF COMPLAINT:  No chief complaint on file. Awake and coversationl. Knows where she is but doesn't know the date Some confusion  REVIEW OF SYSTEMS:  Review of Systems  Unable to perform ROS: Mental status change   DRUG ALLERGIES:   Allergies  Allergen Reactions  . Tylenol [Acetaminophen] Nausea And Vomiting   VITALS:  Blood pressure 129/64, pulse 70, temperature 99 F (37.2 C), temperature source Oral, resp. rate 18, height 5\' 6"  (1.676 m), weight 61 kg (134 lb 7.7 oz), SpO2 100 %. PHYSICAL EXAMINATION:  Physical Exam  Constitutional: She is oriented to person, place, and time.  HENT:  Head: Normocephalic and atraumatic.  Eyes: Conjunctivae and EOM are normal. Pupils are equal, round, and reactive to light.  Neck: Normal range of motion. Neck supple. No tracheal deviation present. No thyromegaly present.  Cardiovascular: Normal rate, regular rhythm and normal heart sounds.   Pulmonary/Chest: Effort normal and breath sounds normal. No respiratory distress. She has no wheezes. She exhibits no tenderness.  Abdominal: Soft. Bowel sounds are normal. She exhibits no distension. There is no tenderness.  Musculoskeletal: Normal range of motion.  Neurological: She is oriented to person, place, and time. She displays facial symmetry. No cranial nerve deficit.  Moving all extremities  Skin: Skin is warm and dry. No rash noted.  Psychiatric: Mood and affect normal.  Nursing note and vitals reviewed.  LABORATORY PANEL:   CBC  Recent Labs Lab 11/10/15 0454  WBC 0.7*  HGB 7.6*  HCT 21.9*  PLT 83*   ------------------------------------------------------------------------------------------------------------------ Chemistries   Recent Labs Lab 11/09/15 0449  11/10/15 0454  NA 141  --  138  K 2.9*  < > 2.7*  CL 117*  --   114*  CO2 18*  --  19*  GLUCOSE 110*  --  100*  BUN 11  --  <5*  CREATININE 0.83  --  0.63  CALCIUM 8.7*  --  8.5*  MG 1.5*  --  1.7  AST 11*  --   --   ALT 11*  --   --   ALKPHOS 68  --   --   BILITOT 0.8  --   --   < > = values in this interval not displayed. RADIOLOGY:  Mr Brain Wo Contrast  Result Date: 11/09/2015 CLINICAL DATA:  Altered mental status, neutropenia with sepsis. Colon cancer with EXAM: MRI HEAD WITHOUT CONTRAST TECHNIQUE: Multiplanar, multiecho pulse sequences of the brain and surrounding structures were obtained without intravenous contrast. COMPARISON:  CT head 11/06/2015.  PET scan 08/21/2015. FINDINGS: The patient was unable to remain motionless for the exam. Small or subtle lesions could be overlooked. No evidence for acute infarction, hemorrhage, mass lesion, hydrocephalus, or extra-axial fluid. Advanced cerebral and cerebellar atrophy. Moderate subcortical and periventricular T2 and FLAIR hyperintensities, likely chronic microvascular ischemic change. Flow voids are maintained. No definite chronic hemorrhage. Partial empty sella. LEFT mastoid effusion. No layering sinus fluid. Negative orbits. No definite osseous lesions. IMPRESSION: Motion degraded exam, performed without contrast, demonstrating no definite intracranial metastatic disease. Optimal evaluation for intracranial metastases is performed following administration of IV MultiHance, and could be performed as clinically indicated. On the current exam, no intracranial mass lesion is evident however. Electronically Signed   By: Staci Righter M.D.   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 Now with altered mental status, and neutopenia with sepsis.   * Klebsiella bacteremia with likely source is UTI - present on admission. -  Continue invanz  * A.fib with RVR - Cardizem drip stopped. - Afib likely from sepsis  * Acute Metabolic  encephalopathy - Multifactorial - Normal Ammonia - Brain Mets? -  likely due to Sepsis MRI was motion degraded but did not show anything acute  * Pancytopenia - Due to chemo - She was given neulasta 11/01/15 as per oncology notes.  * Diabetes mellitus type 2 decreased intake due to AMS, monitor with SCC.  * Metastatic colon cancer getting chemotherapy Presented with the neutropenia, chemotherapy was last week. Oncology input appreciated   Palliative care consult.  * Essential hypertension On cardizem  * Ac renal insufficiency   Due to dehydration Resolved  DVT prophylaxis SCDs due to thrombocytoepnia  Consult speech and start diet after that  All the records are reviewed and case discussed with Care Management/Social Worker. Management plans discussed with the patient, family and they are in agreement.  CODE STATUS: FULL CODE  TOTAL TIME TAKING CARE OF THIS PATIENT: 35 minutes.   Hillary Bow R M.D on 11/10/2015 at 10:25 AM  Between 7am to 6pm - Pager - [phone]  After 6pm go to [email or web address] - Proofreader  [facility]  Office  [phone]  CC: Primary care physician; [name], MD  Note: This dictation was prepared with Dragon dictation along with smaller phrase technology. Any transcriptional errors that result from this process are unintentional.

## 2015-11-10 NOTE — Progress Notes (Signed)
Confused. Bed bound. Slept well through the night. On IV amiodorone.

## 2015-11-10 NOTE — Plan of Care (Signed)
Problem: Health Behavior/Discharge Planning: Goal: Ability to manage health-related needs will improve Outcome: Not Progressing Patient is dependant

## 2015-11-11 DIAGNOSIS — C785 Secondary malignant neoplasm of large intestine and rectum: Secondary | ICD-10-CM

## 2015-11-11 DIAGNOSIS — Z66 Do not resuscitate: Secondary | ICD-10-CM

## 2015-11-11 DIAGNOSIS — A419 Sepsis, unspecified organism: Secondary | ICD-10-CM

## 2015-11-11 DIAGNOSIS — R404 Transient alteration of awareness: Secondary | ICD-10-CM

## 2015-11-11 DIAGNOSIS — Z515 Encounter for palliative care: Secondary | ICD-10-CM

## 2015-11-11 DIAGNOSIS — T50904A Poisoning by unspecified drugs, medicaments and biological substances, undetermined, initial encounter: Secondary | ICD-10-CM

## 2015-11-11 DIAGNOSIS — R531 Weakness: Secondary | ICD-10-CM

## 2015-11-11 DIAGNOSIS — R4182 Altered mental status, unspecified: Secondary | ICD-10-CM

## 2015-11-11 LAB — BASIC METABOLIC PANEL
ANION GAP: 4 — AB (ref 5–15)
BUN: <5 mg/dL — ABNORMAL LOW (ref 6–20)
CALCIUM: 8.9 mg/dL (ref 8.9–10.3)
CO2: 22 mmol/L (ref 22–32)
CREATININE: 0.61 mg/dL (ref 0.44–1.00)
Chloride: 113 mmol/L — ABNORMAL HIGH (ref 101–111)
Glucose, Bld: 115 mg/dL — ABNORMAL HIGH (ref 65–99)
Potassium: 3.4 mmol/L — ABNORMAL LOW (ref 3.5–5.1)
SODIUM: 139 mmol/L (ref 135–145)

## 2015-11-11 LAB — CBC WITH DIFFERENTIAL/PLATELET
BASOS ABS: 0 10*3/uL (ref 0–0.1)
Band Neutrophils: 14 %
Basophils Relative: 0 %
Blasts: 0 %
EOS PCT: 0 %
Eosinophils Absolute: 0 10*3/uL (ref 0–0.7)
HCT: 24.1 % — ABNORMAL LOW (ref 35.0–47.0)
Hemoglobin: 8.1 g/dL — ABNORMAL LOW (ref 12.0–16.0)
LYMPHS ABS: 1.2 10*3/uL (ref 1.0–3.6)
Lymphocytes Relative: 36 %
MCH: 29.2 pg (ref 26.0–34.0)
MCHC: 33.8 g/dL (ref 32.0–36.0)
MCV: 86.4 fL (ref 80.0–100.0)
MONOS PCT: 5 %
MYELOCYTES: 1 %
Metamyelocytes Relative: 1 %
Monocytes Absolute: 0.2 10*3/uL (ref 0.2–0.9)
NEUTROS ABS: 1.8 10*3/uL (ref 1.4–6.5)
Neutrophils Relative %: 41 %
Other: 2 %
PLATELETS: 138 10*3/uL — AB (ref 150–440)
Promyelocytes Absolute: 0 %
RBC: 2.78 MIL/uL — AB (ref 3.80–5.20)
RDW: 17 % — AB (ref 11.5–14.5)
Smear Review: ADEQUATE
WBC: 3.2 10*3/uL — AB (ref 3.6–11.0)
nRBC: 1 /100 WBC — ABNORMAL HIGH

## 2015-11-11 LAB — GLUCOSE, CAPILLARY
GLUCOSE-CAPILLARY: 171 mg/dL — AB (ref 65–99)
GLUCOSE-CAPILLARY: 148 mg/dL — AB (ref 65–99)
GLUCOSE-CAPILLARY: 181 mg/dL — AB (ref 65–99)
Glucose-Capillary: 107 mg/dL — ABNORMAL HIGH (ref 65–99)
Glucose-Capillary: 103 mg/dL — ABNORMAL HIGH (ref 65–99)

## 2015-11-11 MED ORDER — POTASSIUM CHLORIDE CRYS ER 20 MEQ PO TBCR
40.0000 meq | EXTENDED_RELEASE_TABLET | ORAL | Status: AC
  Administered 2015-11-11 (×2): 40 meq via ORAL
  Filled 2015-11-11: qty 2

## 2015-11-11 MED ORDER — AMIODARONE HCL 200 MG PO TABS
400.0000 mg | ORAL_TABLET | Freq: Every day | ORAL | Status: DC
  Administered 2015-11-11 – 2015-11-12 (×2): 400 mg via ORAL
  Filled 2015-11-11 (×2): qty 2

## 2015-11-11 MED ORDER — DIGOXIN 125 MCG PO TABS
0.0625 mg | ORAL_TABLET | Freq: Once | ORAL | Status: AC
  Administered 2015-11-11: 0.0625 mg via ORAL
  Filled 2015-11-11: qty 1

## 2015-11-11 MED ORDER — AMIODARONE HCL IN DEXTROSE 360-4.14 MG/200ML-% IV SOLN
30.0000 mg/h | INTRAVENOUS | Status: DC
  Administered 2015-11-11: 30 mg/h via INTRAVENOUS
  Filled 2015-11-11: qty 200

## 2015-11-11 MED ORDER — METOPROLOL TARTRATE 50 MG PO TABS
50.0000 mg | ORAL_TABLET | Freq: Two times a day (BID) | ORAL | Status: DC
  Administered 2015-11-11 – 2015-11-12 (×3): 50 mg via ORAL
  Filled 2015-11-11 (×3): qty 1

## 2015-11-11 NOTE — Progress Notes (Signed)
[facility] Hematology/Oncology Progress Note  Date of admission: 11/07/2015  Hospital day:  11/11/15  Chief Complaint: Sandy [name] is a 70 y.o. female who was admitted on day 10 s/p cycle #2 FOLFIRI chemotherapy with altered mental status, melanotic stools, neutropenia and hypothermia.  Subjective:  Feeling better.  Eating some.  No nausea or vomiting.  Wondering when she can go home.  Social History: The patient is accompanied by her daughter, son, and his girlfriend today.  Allergies:  Allergies  Allergen Reactions  . Tylenol [Acetaminophen] Nausea And Vomiting    Scheduled Medications: . amiodarone  400 mg Oral Daily  . antiseptic oral rinse  7 mL Mouth Rinse q12n4p  . aspirin EC  81 mg Oral Daily  . atorvastatin  40 mg Oral Daily  . cefTRIAXone (ROCEPHIN)  IV  2 g Intravenous Q24H  . chlorhexidine  15 mL Mouth Rinse BID  . digoxin  0.125 mg Oral Daily  . feeding supplement (ENSURE ENLIVE)  237 mL Oral BID BM  . insulin aspart  0-9 Units Subcutaneous Q4H  . megestrol  200 mg Oral Daily  . metoprolol tartrate  50 mg Oral BID    Review of Systems: GENERAL:  Feels better.  No fevers or sweats. PERFORMANCE STATUS (ECOG):  3 HEENT:  No visual changes, runny nose, sore throat, mouth sores or tenderness. Lungs: No shortness of breath or cough.  No hemoptysis. Cardiac:  Atrial fibrillation.  No chest pain, palpitations, orthopnea, or PND. GI:  No nausea, vomiting, diarrhea, constipation, or hematochezia.  Dark stool. GU:  No urgency, frequency, dysuria, or hematuria. Musculoskeletal:  No back pain.  No joint pain.  No muscle tenderness. Extremities:  No pain or swelling. Skin:  No rashes or skin changes. Neuro:  No headache, numbness or focal weakness.  Has not gotten out of bed. Endocrine:  Diabetes.  No thyroid issues, hot flashes or night sweats. Psych:  No mood changes, depression or anxiety. Pain:  No focal pain. Review of systems:  All other  systems reviewed and found to be negative.  Physical Exam: Blood pressure (!) 119/53, pulse (!) 55, temperature 98.1 F (36.7 C), temperature source Oral, resp. rate 18, height 5' 6" (1.676 m), weight 134 lb 7.7 oz (61 kg), SpO2 97 %.  GENERAL: Elderly woman lying comfortably in a monitored bed, nearly back to baseline mental status. MENTAL STATUS: Alert and oriented to person, place, and time. HEAD: Alopecia totalis. Normocephalic, atraumatic, face symmetric, no Cushingoid features. EYES: Hazel/green eyes. Pupils equal round and reactive to light and accomodation. No conjunctivitis or scleral icterus. ENT:  No oral lesions. RESPIRATORY: Clear to auscultation without rales, wheezes or rhonchi. CARDIOVASCULAR: Irregular rhythm with II-II/VI flow murmur. ABDOMEN: Soft, non-tender with active bowel sounds. No guarding or rebound tenderness. No hepatosplenomegaly. SKIN: No rashes, ulcer or skin lesions.  EXTREMITIES: No edema, no skin discoloration or tenderness. No palpable cords. LYMPH NODES: No palpable supraclavicular, axillary or inguinal adenopathy  NEUROLOGICAL: Appropriate.  Conversant.  Fellows commands.  Moves all 4 extremities.   Results for orders placed or performed during the hospital encounter of 11/07/15 (from the past 48 hour(s))  Glucose, capillary     Status: Abnormal   Collection Time: 11/09/15 11:47 PM  Result Value Ref Range   Glucose-Capillary 104 (H) 65 - 99 mg/dL   Comment 1 Notify RN    Comment 2 Document in Chart   Glucose, capillary     Status: None   Collection Time:  11/10/15  4:01 AM  Result Value Ref Range   Glucose-Capillary 97 65 - 99 mg/dL   Comment 1 Notify RN   CBC with Differential/Platelet     Status: Abnormal   Collection Time: 11/10/15  4:54 AM  Result Value Ref Range   WBC 0.7 (LL) 3.6 - 11.0 K/uL    Comment: RESULT REPEATED AND VERIFIED CRITICAL RESULT CALLED TO, READ BACK BY AND VERIFIED WITH: CRYSTAL BALLENTINE AT 0605 ON  11/10/15 RWW    RBC 2.62 (L) 3.80 - 5.20 MIL/uL   Hemoglobin 7.6 (L) 12.0 - 16.0 g/dL   HCT 21.9 (L) 35.0 - 47.0 %   MCV 83.6 80.0 - 100.0 fL   MCH 29.0 26.0 - 34.0 pg   MCHC 34.7 32.0 - 36.0 g/dL   RDW 16.8 (H) 11.5 - 14.5 %   Platelets 83 (L) 150 - 440 K/uL   Neutrophils Relative % 33 %   Lymphocytes Relative 54 %   Monocytes Relative 6 %   Eosinophils Relative 2 %   Basophils Relative 0 %   Band Neutrophils 2 %   Metamyelocytes Relative 0 %   Myelocytes 1 %   Promyelocytes Absolute 0 %   Blasts 0 %   nRBC 0 0 /100 WBC   Other 2 %   Neutro Abs 0.3 (L) 1.4 - 6.5 K/uL   Lymphs Abs 0.4 (L) 1.0 - 3.6 K/uL   Monocytes Absolute 0.0 (L) 0.2 - 0.9 K/uL   Eosinophils Absolute 0.0 0 - 0.7 K/uL   Basophils Absolute 0.0 0 - 0.1 K/uL   RBC Morphology POLYCHROMASIA PRESENT     Comment: MIXED RBC POPULATION SCHISTOCYTES NOTED ON SMEAR    Smear Review LARGE PLATELETS PRESENT   Basic metabolic panel     Status: Abnormal   Collection Time: 11/10/15  4:54 AM  Result Value Ref Range   Sodium 138 135 - 145 mmol/L   Potassium 2.7 (LL) 3.5 - 5.1 mmol/L    Comment: CRITICAL RESULT CALLED TO, READ BACK BY AND VERIFIED WITH CRYSTAL BALLENTINE AT 0520 11/10/15.PMH   Chloride 114 (H) 101 - 111 mmol/L   CO2 19 (L) 22 - 32 mmol/L   Glucose, Bld 100 (H) 65 - 99 mg/dL   BUN <5 (L) 6 - 20 mg/dL   Creatinine, Ser 0.63 0.44 - 1.00 mg/dL   Calcium 8.5 (L) 8.9 - 10.3 mg/dL   GFR calc non Af Amer >60 >60 mL/min   GFR calc Af Amer >60 >60 mL/min    Comment: (NOTE) The eGFR has been calculated using the CKD EPI equation. This calculation has not been validated in all clinical situations. eGFR's persistently <60 mL/min signify possible Chronic Kidney Disease.    Anion gap 5 5 - 15  Magnesium     Status: None   Collection Time: 11/10/15  4:54 AM  Result Value Ref Range   Magnesium 1.7 1.7 - 2.4 mg/dL  Glucose, capillary     Status: Abnormal   Collection Time: 11/10/15  6:39 AM  Result Value Ref  Range   Glucose-Capillary 103 (H) 65 - 99 mg/dL   Comment 1 Notify RN   Glucose, capillary     Status: Abnormal   Collection Time: 11/10/15  7:36 AM  Result Value Ref Range   Glucose-Capillary 110 (H) 65 - 99 mg/dL  Glucose, capillary     Status: Abnormal   Collection Time: 11/10/15 12:04 PM  Result Value Ref Range   Glucose-Capillary 155 (H) 65 -  99 mg/dL  Potassium     Status: None   Collection Time: 11/10/15  4:23 PM  Result Value Ref Range   Potassium 3.8 3.5 - 5.1 mmol/L  Glucose, capillary     Status: None   Collection Time: 11/10/15  4:40 PM  Result Value Ref Range   Glucose-Capillary 95 65 - 99 mg/dL  Glucose, capillary     Status: Abnormal   Collection Time: 11/10/15  7:50 PM  Result Value Ref Range   Glucose-Capillary 141 (H) 65 - 99 mg/dL   Comment 1 Notify RN   Glucose, capillary     Status: Abnormal   Collection Time: 11/10/15 11:34 PM  Result Value Ref Range   Glucose-Capillary 105 (H) 65 - 99 mg/dL   Comment 1 Notify RN   Glucose, capillary     Status: Abnormal   Collection Time: 11/11/15  4:11 AM  Result Value Ref Range   Glucose-Capillary 107 (H) 65 - 99 mg/dL   Comment 1 Notify RN   CBC with Differential/Platelet     Status: Abnormal   Collection Time: 11/11/15  6:30 AM  Result Value Ref Range   WBC 3.2 (L) 3.6 - 11.0 K/uL   RBC 2.78 (L) 3.80 - 5.20 MIL/uL   Hemoglobin 8.1 (L) 12.0 - 16.0 g/dL   HCT 24.1 (L) 35.0 - 47.0 %   MCV 86.4 80.0 - 100.0 fL   MCH 29.2 26.0 - 34.0 pg   MCHC 33.8 32.0 - 36.0 g/dL   RDW 17.0 (H) 11.5 - 14.5 %   Platelets 138 (L) 150 - 440 K/uL    Comment: PLATELET CLUMPS NOTED ON SMEAR, COUNT APPEARS ADEQUATE   Neutrophils Relative % 41 %   Lymphocytes Relative 36 %   Monocytes Relative 5 %   Eosinophils Relative 0 %   Basophils Relative 0 %   Band Neutrophils 14 %   Metamyelocytes Relative 1 %   Myelocytes 1 %   Promyelocytes Absolute 0 %   Blasts 0 %   nRBC 1 (H) 0 /100 WBC   Other 2 %   Neutro Abs 1.8 1.4 - 6.5 K/uL    Lymphs Abs 1.2 1.0 - 3.6 K/uL   Monocytes Absolute 0.2 0.2 - 0.9 K/uL   Eosinophils Absolute 0.0 0 - 0.7 K/uL   Basophils Absolute 0.0 0 - 0.1 K/uL   RBC Morphology POLYCHROMASIA PRESENT    Smear Review      PLATELET CLUMPS NOTED ON SMEAR, COUNT APPEARS ADEQUATE    Comment: PENDING PATHOLOGIST REVIEW  Basic metabolic panel     Status: Abnormal   Collection Time: 11/11/15  6:30 AM  Result Value Ref Range   Sodium 139 135 - 145 mmol/L   Potassium 3.4 (L) 3.5 - 5.1 mmol/L   Chloride 113 (H) 101 - 111 mmol/L   CO2 22 22 - 32 mmol/L   Glucose, Bld 115 (H) 65 - 99 mg/dL   BUN <5 (L) 6 - 20 mg/dL   Creatinine, Ser 0.61 0.44 - 1.00 mg/dL   Calcium 8.9 8.9 - 10.3 mg/dL   GFR calc non Af Amer >60 >60 mL/min   GFR calc Af Amer >60 >60 mL/min    Comment: (NOTE) The eGFR has been calculated using the CKD EPI equation. This calculation has not been validated in all clinical situations. eGFR's persistently <60 mL/min signify possible Chronic Kidney Disease.    Anion gap 4 (L) 5 - 15  Glucose, capillary     Status:  Abnormal   Collection Time: 11/11/15  7:24 AM  Result Value Ref Range   Glucose-Capillary 103 (H) 65 - 99 mg/dL  Glucose, capillary     Status: Abnormal   Collection Time: 11/11/15 11:10 AM  Result Value Ref Range   Glucose-Capillary 171 (H) 65 - 99 mg/dL  Glucose, capillary     Status: Abnormal   Collection Time: 11/11/15  4:58 PM  Result Value Ref Range   Glucose-Capillary 148 (H) 65 - 99 mg/dL  Glucose, capillary     Status: Abnormal   Collection Time: 11/11/15  9:07 PM  Result Value Ref Range   Glucose-Capillary 181 (H) 65 - 99 mg/dL   No results found.  Assessment:  COPELYN WIDMER is a 70 y.o. female with metastatic colon cancer currently day 14 s/p cycle #2 FOLFIRI chemotherapy with Neulasta support.  She was admitted with altered mental status.  She has fever and neutropenia.  Blood culture ID panel reveals Enterobacteriaceae species and Klebsiella  pneumoniae.  Plan:   1. Oncology: Day 14 s/p cycle #2 FOLFIRI with Neulasta support. Current status of metastatic disease unknown as patient recently started on FOLFIRI. Prior to initiation of FOLFIRI, disease had progressed. Discussed with patient and family thoughts about ongoing chemotherapy given 2 recent admissions for fever and neutropenia (despite Neulasta support).  Quality of life discussed.    Per family request, check CEA (assess benefit of FOLFIRI).  Last CEA was 1,341 on 10/08/2015.  Discuss consideration of 5FU and cetuximab.  Check IgE antibodies to galactose-alpha-1,3 galactose (oligosaccharide part on Fab portion of the cetuximab heavy chain) to assess for potential cetuximab reaction.  2. Hematology: Patient's counts recovering.  No longer neutropenic (ANC 1800).  Platelets improving (83,000 to 138,000).   Patient received Neulasta post chemotherapy.  Anemia with history of melanotic stool. Maintain active type and cross.  Transfuse as needed.  Daily CBC with diff.  3. Infectious disease: ANC 1800.  Klebsiella bacteremia. Cefepime switched to ceftriaxone.  Blood cultures q 24 hours prn temp > 100.4  4. Neurology: Mental status near baseline.  Etiology of altered mental statu secondary to sepsis.  Head MRI was motion degraded, but no metastatic disease noted.  5. Cardiology:  Atrial fibrillation with RVR similar to last admission.  Patient received cardizem then metoprolol.  Appreciate cardiology consult.  6.  Fluids, electrolytes, nutrition: Oral intake improving. Baseline creatinine 0.66-0.80.  Creatinine back to baseline.  7.  Disposition:  Anticipate discharge home per family request when cleared by cardiology.  Patient being set up for [facility].  Home health and physical therapy.  Code status DNR per chart.  Appreciate palliative care consult.  Thank you for allowing me to participate in Ms Lazaro's care.  I will follow her closely with you while  hospitalized and after discharge into the outpatient department.   Lequita Asal, MD  11/11/2015, 11:19 PM

## 2015-11-11 NOTE — Progress Notes (Signed)
Spoke with Dr. Darvin Neighbours, orders to give new dose of amio and metoprolol PO and then stop amio drip about an hour later. Will continue to monitor. VSS at this time. Patient resting quietly, no complaints.

## 2015-11-11 NOTE — Progress Notes (Signed)
Notified Dr. Marcille Blanco that heart rate still going up to 140s and dropping into the 60s. Amiodorone drip initiated per order. Will continue to monitor.

## 2015-11-11 NOTE — Progress Notes (Signed)
Dr. Darvin Neighbours rounding now. Instructed to d/c order for PO amio and keep patient on drip. MD to speak with cardiologist. No other orders at this time.

## 2015-11-11 NOTE — Care Management (Signed)
Barrier to discharge: Afib with rvr overnight with amio gtt started. This is being stopped today. Cardiology consulted. Palliative consult pending

## 2015-11-11 NOTE — Care Management Important Message (Signed)
Important Message  Patient Details  Name: Sandy [name] MRN: [id number] Date of Birth: [birth year]   Medicare Important Message Given:  Yes    Jolly Mango, RN 11/11/2015, 8:59 AM

## 2015-11-11 NOTE — Progress Notes (Signed)
Patient staying sinus brady 50, asymptomatic, resting quietly in bed with visitors at bedside. Dr. Darvin Neighbours aware - patient received a lot of meds this morning, will continue to monitor and see how she looks tonight/tomorrow now that some meds have been stopped. No new orders at this time.

## 2015-11-11 NOTE — Care Management (Addendum)
Previous RNCM notes reviewed. Following and will speak with family regarding discharge planning. TC to daughter, Lynnix Donarski with no answer.

## 2015-11-11 NOTE — Care Management (Signed)
Have spoken with Flo Shanks and patient is appropriate for [facility]. Palliative care has assessed patient and are in agreement with POC.

## 2015-11-11 NOTE — Plan of Care (Signed)
Problem: SLP Dysphagia Goals Goal: Misc Dysphagia Goal Pt will safely tolerate po diet of least restrictive consistency w/ no overt s/s of aspiration noted by Staff/pt/family x3 sessions.    

## 2015-11-11 NOTE — Consult Note (Signed)
Consultation Note Date: 11/11/2015   Patient Name: Sandy [name]  DOB: 06-20-1945  MRN: [id number]  Age / Sex: 70 y.o., female  PCP: [name], MD Referring Physician: Hillary Bow, MD  Reason for Consultation: Establishing goals of care and Psychosocial/spiritual support  HPI/Patient Profile: 70 y.o. 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 Recent admission for neutropenic fever.  She was seen in the ER on 11/05/2015 with right sided abdominal pain. Abdominal and pelvic CT scan revealed no significant change in hepatic metastasis. There was fullness at the junction of the descending colon and proximal sigmoid colon similar to prior scans. Labs included a hematocrit of 24.5, hemoglobin 8.4, platelets 243,000, WBC 800 with an ANC of 300. She received IVF and was discharged back to [facility].  She was seen in the ER on 11/06/2015 with altered mental status (somnolence). Head CT was negative. CXR was negative. Labs included a creatinine of 1.23. CBC revealed a hematocrit of 26.3, hemoglobin 8.7, WBC was 500 with an ANC of 100. Discharged home.  Her family contacted the cancer clinic secondary to 2 episodes of melanotic stool. As pt was drowsy and dehydrated, Dr. Osvaldo Shipper suggested for direct admission.  Patient and family face advanced directive decisions and anticipatory care needs.   Clinical Assessment and Goals of Care:    This NP Wadie Lessen reviewed medical records, received report from team, assessed the patient and then meet at the patient's bedside along with her daughter Raena Ortlip and patient's son  to discuss diagnosis, prognosis, GOC, EOL wishes disposition and options.   A detailed discussion was had today regarding advanced directives.  Concepts specific to code status,  artifical feeding and hydration, continued IV antibiotics and rehospitalization was had.  The difference between a aggressive medical intervention path  and a palliative comfort care path for this patient at this time was had.  Values and goals of care important to patient and family were attempted to be elicited.  Concept of Hospice and Palliative Care were discussed   MOST form introduced  Natural trajectory and expectations at EOL were discussed.  Questions and concerns addressed.  . Family encouraged to call with questions or concerns.  PMT will continue to support holistically.    SUMMARY OF RECOMMENDATIONS    Code Status/Advance Care Planning:  DNR    Symptom Management:   Weakness: Physical therapy tx and evaluation  Palliative Prophylaxis:   Aspiration, Bowel Regimen, Frequent Pain Assessment and Oral Care  Additional Recommendations (Limitations, Scope, Preferences):  At this time patient and her  Family are open to all offered and available medical interventions to prolong quality life.  Plan is to f/u with Dr Mike Gip for further chemotherapy options and treatment   Psycho-social/Spiritual:     Additional Recommendations: Education on Hospice  Prognosis:   Unable to determine  Discharge Planning: Home with Home Health      Primary Diagnoses: Present on Admission: . Altered  mental state   I have reviewed the medical record, interviewed the patient and family, and examined the patient. The following aspects are pertinent.  Past Medical History:  Diagnosis Date  . Cancer ([place])    liver mets; colon cancer  . Constipation   . Diabetes mellitus without complication ([place])   . HOH (hard of hearing)   . Hypertension   . Insomnia   . 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 Sickle cell anemia ([facility])   . Wears dentures    partial upper and lower   Social History   Social History  . Marital status: Widowed    Spouse name: N/A  .  Number of children: N/A  . Years of education: N/A   Social History Main Topics  . Smoking status: Former Smoker    Packs/day: 0.25    Years: 20.00    Types: Cigarettes    Quit date: 09/06/2013  . Smokeless tobacco: Never Used  . Alcohol use No  . Drug use: No  . Sexual activity: No   Other Topics Concern  . None   Social History Narrative  . None   Family History  Problem Relation Age of Onset  . Cancer Sister    Scheduled Meds: . amiodarone  400 mg Oral Daily  . antiseptic oral rinse  7 mL Mouth Rinse q12n4p  . aspirin EC  81 mg Oral Daily  . atorvastatin  40 mg Oral Daily  . cefTRIAXone (ROCEPHIN)  IV  2 g Intravenous Q24H  . chlorhexidine  15 mL Mouth Rinse BID  . digoxin  0.125 mg Oral Daily  . feeding supplement (ENSURE ENLIVE)  237 mL Oral BID BM  . insulin aspart  0-9 Units Subcutaneous Q4H  . megestrol  200 mg Oral Daily  . metoprolol tartrate  50 mg Oral BID  . potassium chloride  40 mEq Oral Q4H   Continuous Infusions:  PRN Meds:.oxyCODONE Medications Prior to Admission:  Prior to Admission medications   Medication Sig Start Date End Date Taking? Authorizing Provider  amLODipine (NORVASC) 5 MG tablet Take 5 mg by mouth daily. Reported on 09/24/2015    Historical Provider, MD  aspirin EC 81 MG tablet Take 81 mg by mouth daily. Reported on 08/26/2015    Historical Provider, MD  atorvastatin (LIPITOR) 40 MG tablet Take 40 mg by mouth daily.    Historical Provider, MD  cefUROXime (CEFTIN) 250 MG tablet Take 1 tablet (250 mg total) by mouth 2 (two) times daily with a meal. 10/22/15   Vaughan Basta, MD  diltiazem (CARDIZEM) 60 MG tablet Take 1 tablet (60 mg total) by mouth every 8 (eight) hours. 10/22/15   Vaughan Basta, MD  docusate sodium (COLACE) 100 MG capsule Take 1 capsule (100 mg total) by mouth 2 (two) times daily. 10/22/15   Vaughan Basta, MD  feeding supplement, ENSURE ENLIVE, (ENSURE ENLIVE) LIQD Take 237 mLs by mouth 2 (two) times  daily between meals. 10/22/15   Vaughan Basta, MD  gabapentin (NEURONTIN) 100 MG capsule Take 2 capsules (200 mg total) by mouth at bedtime. 1 tab daily and 2 tabs at bedtime. 07/16/15   Lequita Asal, MD  lidocaine-prilocaine (EMLA) cream Apply 1 application topically as needed. Apply to port 1 hour prior to chemotherapy appointment. Cover with plastic wrap. 07/08/15   Lequita Asal, MD  lisinopril (PRINIVIL,ZESTRIL) 10 MG tablet Take 10 mg by mouth daily.    Historical Provider, MD  magnesium 30 MG tablet Take 30 mg by mouth daily.     Historical Provider, MD  megestrol (MEGACE) 400 MG/10ML suspension Take 5 mLs (200 mg total) by mouth daily. to stimulate appetite 11/06/14   Lequita Asal, MD  metFORMIN (GLUCOPHAGE) 1000 MG tablet Take 1,000 mg by mouth 2 (two) times daily.     Historical Provider, MD  Multiple Vitamin (MULTIVITAMIN WITH MINERALS) TABS tablet Take 1 tablet by mouth daily.    Historical Provider, MD  ondansetron (ZOFRAN) 4 MG tablet Take 1 tablet (4 mg total) by mouth every 8 (eight) hours as needed for nausea or vomiting. 12/13/14   Lequita Asal, MD  oxyCODONE (OXY IR/ROXICODONE) 5 MG immediate release tablet Take 1 tablet (5 mg total) by mouth every 6 (six) hours as needed for severe pain. 10/22/15   Vaughan Basta, MD  traZODone (DESYREL) 50 MG tablet Take 0.5 tablets (25 mg total) by mouth at bedtime as needed for sleep. 10/22/15   Vaughan Basta, MD   Allergies  Allergen Reactions  . Tylenol [Acetaminophen] Nausea And Vomiting   Review of Systems  Constitutional: Positive for fever.  Neurological: Positive for weakness.    Physical Exam  Constitutional: She appears cachectic. She appears ill.  Cardiovascular: Normal rate, regular rhythm and normal heart sounds.   Pulmonary/Chest: She has decreased breath sounds in the right lower field and the left lower field.  Skin: Skin is warm and dry.    Vital Signs: BP 112/63 (BP Location:  Left Arm)   Pulse (!) 55   Temp 98 F (36.7 C) (Oral)   Resp 20   Ht 5\' 6"  (1.676 m)   Wt 61 kg (134 lb 7.7 oz)   SpO2 100%   BMI 21.71 kg/m  Pain Assessment: No/denies pain   Pain Score: 0-No pain   SpO2: SpO2: 100 % O2 Device:SpO2: 100 % O2 Flow Rate: .   IO: Intake/output summary:  Intake/Output Summary (Last 24 hours) at 11/11/15 1345 Last data filed at 11/11/15 0330  Gross per 24 hour  Intake            97.14 ml  Output              250 ml  Net          -152.86 ml    LBM: Last BM Date: 11/10/15 Baseline Weight: Weight: 61 kg (134 lb 7.7 oz) Most recent weight: Weight: 61 kg (134 lb 7.7 oz)      Palliative Assessment/Data: 30 %      Time In: 1300 Time Out: 1415 Time Total: 75 min Greater than 50%  of this time was spent counseling and coordinating care related to the above assessment and plan.  Signed by: Wadie Lessen, NP   Please contact Palliative Medicine Team phone at [phone] for questions and concerns.  For individual provider: See Shea Evans

## 2015-11-11 NOTE — Progress Notes (Signed)
Notified Dr. Jannifer Franklin of patient's heart rate going up into the 140's and then dropping into the 60's. Also notified of a 2.7 second pause. One time dose of digoxin ordered.

## 2015-11-11 NOTE — Progress Notes (Signed)
Speech Language Pathology Treatment: Dysphagia  Patient Details Name: Sandy [name] MRN: [id number] DOB: 06-30-1945 Today's Date: 11/11/2015 Time: [id number] SLP Time Calculation (min) (ACUTE ONLY): 35 min  Assessment / Plan / Recommendation Clinical Impression     HPI HPI: Pt is a 70 y.o. female with a known history of Stage IV colon cancer on chemotherapy, last chemotherapy a week ago comes in with fever since yesterday. Temperature was around 100 Fahrenheit at home, patient ambulate in the emergency room on 02 Fahrenheit. No abdominal pain, nausea, vomiting, diarrhea. According to patient's daughter patient had a fall 2 times day before yesterday associated with generalized weakness, fatigue, poor by mouth intake for the past 2 days. Patient also noted to have lethargic since this morning. This morning she called ambulance because of decreased mental status, unable to even stand. Pt has been less alert, lethargic d/t suspected metabolic encephalopathy sec. to neutropenia w/ sepsis. Pt is alert today stating she feels "better". She is conversive and can answer general questions though HOH. She does require min cues for follow through w/ tasks; confusion(?). Pt appeared fatigued.       SLP Plan  Continue with current plan of care     Recommendations  Diet recommendations: Dysphagia 3 (mechanical soft);Thin liquid Liquids provided via: Cup;Straw Medication Administration: Whole meds with puree Supervision: Staff to assist with self feeding;Full supervision/cueing for compensatory strategies Compensations: Minimize environmental distractions;Slow rate;Small sips/bites;Follow solids with liquid Postural Changes and/or Swallow Maneuvers: Seated upright 90 degrees;Upright 30-60 min after meal             Oral Care Recommendations: Oral care BID;Staff/trained caregiver to provide oral care Follow up Recommendations: None Plan: Continue with current plan of care     [place], [place], CCC-SLP  Adria Costley 11/11/2015, 2:51 PM

## 2015-11-11 NOTE — Progress Notes (Signed)
Dr. Darvin Neighbours updated that CCMD called this RN regarding patient - she went back to afib 120's for a few minutes before returning to the 60's and converting to SR with PACs HR 58-70. Patient now maintaining SR 60's. Per MD, proceed with turning off drip 1 hour post PO meds. Dr. Clayborn Bigness to round on patient.

## 2015-11-11 NOTE — Progress Notes (Signed)
[facility] [place] NAME: Sandy [name]    MR#:  [id number]  DATE OF BIRTH:  [birth year]  SUBJECTIVE:  CHIEF COMPLAINT:  No chief complaint on file. Awake and conversational. Feels weak. Afib with RVR overnight and started on amiodarone  REVIEW OF SYSTEMS:  Review of Systems  Constitutional: Positive for malaise/fatigue. Negative for chills and fever.  HENT: Negative for sore throat.   Eyes: Negative for blurred vision, double vision and pain.  Respiratory: Negative for cough, hemoptysis, shortness of breath and wheezing.   Cardiovascular: Negative for chest pain, palpitations, orthopnea and leg swelling.  Gastrointestinal: Negative for abdominal pain, constipation, diarrhea, heartburn, nausea and vomiting.  Genitourinary: Negative for dysuria and hematuria.  Musculoskeletal: Negative for back pain and joint pain.  Skin: Negative for rash.  Neurological: Negative for sensory change, speech change, focal weakness and headaches.  Endo/Heme/Allergies: Does not bruise/bleed easily.  Psychiatric/Behavioral: Positive for memory loss. Negative for depression. The patient is not nervous/anxious.    DRUG ALLERGIES:   Allergies  Allergen Reactions  . Tylenol [Acetaminophen] Nausea And Vomiting   VITALS:  Blood pressure (!) 134/58, pulse 63, temperature 98.9 F (37.2 C), temperature source Oral, resp. rate 18, height 5\' 6"  (1.676 m), weight 61 kg (134 lb 7.7 oz), SpO2 98 %. PHYSICAL EXAMINATION:  Physical Exam  Constitutional: She is oriented to person, place, and time.  HENT:  Head: Normocephalic and atraumatic.  Eyes: Conjunctivae and EOM are normal. Pupils are equal, round, and reactive to light.  Neck: Normal range of motion. Neck supple. No tracheal deviation present. No thyromegaly present.  Cardiovascular: Normal rate, regular rhythm and normal heart sounds.   Pulmonary/Chest: Effort normal and breath sounds normal. No  respiratory distress. She has no wheezes. She exhibits no tenderness.  Abdominal: Soft. Bowel sounds are normal. She exhibits no distension. There is no tenderness.  Musculoskeletal: Normal range of motion.  Neurological: She is oriented to person, place, and time. She displays facial symmetry. No cranial nerve deficit.  Moving all extremities  Skin: Skin is warm and dry. No rash noted.  Psychiatric: Mood and affect normal.  Nursing note and vitals reviewed.  LABORATORY PANEL:   CBC  Recent Labs Lab 11/11/15 0630  WBC 3.2*  HGB 8.1*  HCT 24.1*  PLT PENDING   ------------------------------------------------------------------------------------------------------------------ Chemistries   Recent Labs Lab 11/09/15 0449  11/10/15 0454  11/11/15 0630  NA 141  --  138  --  139  K 2.9*  < > 2.7*  < > 3.4*  CL 117*  --  114*  --  113*  CO2 18*  --  19*  --  22  GLUCOSE 110*  --  100*  --  115*  BUN 11  --  <5*  --  <5*  CREATININE 0.83  --  0.63  --  0.61  CALCIUM 8.7*  --  8.5*  --  8.9  MG 1.5*  --  1.7  --   --   AST 11*  --   --   --   --   ALT 11*  --   --   --   --   ALKPHOS 68  --   --   --   --   BILITOT 0.8  --   --   --   --   < > = values in this interval not displayed. RADIOLOGY:  No results found. ASSESSMENT AND PLAN:  70 yo female  with history of stage 4 colon ca with liver mets on chemo. Now with altered mental status, and neutopenia with sepsis.   * Klebsiella bacteremia with likely source is UTI and neutropenia - present on admission. -  ContinueCeftriaxone  * A.fib with RVR - Cardizem drip stopped. - Had to restart amiodarone due to RVR overnight. Continue PO digoxin and cardizem Will discuss with Dr.Callwood  * Acute Metabolic encephalopathy - Improved - Multifactorial - Normal Ammonia - Brain Mets? -  likely due to Sepsis MRI was motion degraded but did not show anything acute  * Pancytopenia - Improving - Due to chemo - She was given  neulasta 11/01/15 as per oncology notes.  * Diabetes mellitus type 2 SSI  * Metastatic colon cancer getting chemotherapy Oncology input appreciated   Palliative care consult.  * Essential hypertension On cardizem  * Ac renal insufficiency   Due to dehydration Resolved  DVT prophylaxis SCDs due to thrombocytoepnia  Consult speech and start diet after that  All the records are reviewed and case discussed with Care Management/Social Worker. Management plans discussed with the patient, family and they are in agreement.  CODE STATUS: FULL CODE  TOTAL TIME TAKING CARE OF THIS PATIENT: 35 minutes.   Hillary Bow R M.D on 11/11/2015 at 9:45 AM  Between 7am to 6pm - Pager - [phone]  After 6pm go to [email or web address] - Proofreader  [facility]  Office  [phone]  CC: Primary care physician; [name], MD  Note: This dictation was prepared with Dragon dictation along with smaller phrase technology. Any transcriptional errors that result from this process are unintentional.

## 2015-11-11 NOTE — Progress Notes (Signed)
Discussed case with Dr. Clayborn Bigness  Suggested starting patient on Lopressor and stopping Cardizem. Also start patient on oral amiodarone 400 mg daily. We'll stop the amiodarone drip. Continue digoxin.

## 2015-11-11 NOTE — Care Management (Signed)
TC received from daughter, [name]. Discussed home health services and she would like to have [facility] for her mother. Palliative care pending. Will see if appropriate.

## 2015-11-12 ENCOUNTER — Inpatient Hospital Stay: Payer: Medicare Other | Admitting: Hematology and Oncology

## 2015-11-12 ENCOUNTER — Inpatient Hospital Stay: Payer: Medicare Other

## 2015-11-12 LAB — CULTURE, BLOOD (ROUTINE X 2): Culture: NO GROWTH

## 2015-11-12 LAB — BASIC METABOLIC PANEL
Anion gap: 5 (ref 5–15)
BUN: 6 mg/dL (ref 6–20)
CALCIUM: 9.1 mg/dL (ref 8.9–10.3)
CHLORIDE: 116 mmol/L — AB (ref 101–111)
CO2: 21 mmol/L — AB (ref 22–32)
CREATININE: 0.7 mg/dL (ref 0.44–1.00)
GFR calc Af Amer: >60 mL/min (ref >60)
GFR calc non Af Amer: >60 mL/min (ref >60)
Glucose, Bld: 140 mg/dL — ABNORMAL HIGH (ref 65–99)
Potassium: 3.7 mmol/L (ref 3.5–5.1)
Sodium: 142 mmol/L (ref 135–145)

## 2015-11-12 LAB — GLUCOSE, CAPILLARY
GLUCOSE-CAPILLARY: 155 mg/dL — AB (ref 65–99)
GLUCOSE-CAPILLARY: 174 mg/dL — AB (ref 65–99)
Glucose-Capillary: 140 mg/dL — ABNORMAL HIGH (ref 65–99)
Glucose-Capillary: 104 mg/dL — ABNORMAL HIGH (ref 65–99)

## 2015-11-12 LAB — CBC WITH DIFFERENTIAL/PLATELET
BASOS ABS: 0 10*3/uL (ref 0–0.1)
BASOS PCT: 0 %
EOS ABS: 0 10*3/uL (ref 0–0.7)
Eosinophils Relative: 0 %
HEMATOCRIT: 24.7 % — AB (ref 35.0–47.0)
HEMOGLOBIN: 8.5 g/dL — AB (ref 12.0–16.0)
LYMPHS ABS: 1.1 10*3/uL (ref 1.0–3.6)
LYMPHS PCT: 12 %
MCH: 29.2 pg (ref 26.0–34.0)
MCHC: 34.6 g/dL (ref 32.0–36.0)
MCV: 84.6 fL (ref 80.0–100.0)
MONOS PCT: 3 %
Monocytes Absolute: 0.3 10*3/uL (ref 0.2–0.9)
NEUTROS ABS: 7.5 10*3/uL — AB (ref 1.4–6.5)
Neutrophils Relative %: 85 %
Platelets: 221 10*3/uL (ref 150–440)
RBC: 2.92 MIL/uL — ABNORMAL LOW (ref 3.80–5.20)
RDW: 17.2 % — AB (ref 11.5–14.5)
WBC: 8.9 10*3/uL (ref 3.6–11.0)

## 2015-11-12 LAB — PATHOLOGIST SMEAR REVIEW

## 2015-11-12 MED ORDER — AMIODARONE HCL 400 MG PO TABS: 400.0000 mg | ORAL_TABLET | Freq: Every day | ORAL | 0 refills | Status: DC

## 2015-11-12 MED ORDER — HEPARIN SOD (PORK) LOCK FLUSH 100 UNIT/ML IV SOLN
500.0000 [IU] | Freq: Once | INTRAVENOUS | Status: AC
  Administered 2015-11-12: 500 [IU] via INTRAVENOUS
  Filled 2015-11-12: qty 5

## 2015-11-12 MED ORDER — CEFUROXIME AXETIL 500 MG PO TABS: 250.0000 mg | ORAL_TABLET | Freq: Two times a day (BID) | ORAL | 0 refills | Status: DC

## 2015-11-12 MED ORDER — METOPROLOL TARTRATE 50 MG PO TABS: 50.0000 mg | ORAL_TABLET | Freq: Two times a day (BID) | ORAL | 0 refills | Status: DC

## 2015-11-12 NOTE — Progress Notes (Signed)
Heart rate staying in the 50's. MD notified, told RN to give amiodarone and metoprolol, hold digoxin. Ammie Dalton, RN

## 2015-11-12 NOTE — Evaluation (Signed)
Physical Therapy Evaluation Patient Details Name: Sandy [name] MRN: [id number] DOB: 03/02/1946 Today's Date: 11/12/2015   History of Present Illness  Pt is a 70 yr old female with known history of stage IV colon cancer with liver metastasis (on chemo), HTN , DM, and neuropathy who [facility] from [facility] with melanotic stool and AMS. Pt admitted with neutropenia and metabolic encephalopathy secondary to sepsis 7/27. Throughout course of stay, pt was found to have AFib with RVR which was medically managed. Pt in sinus bradycardia at time of evaluation 8/1.    Clinical Impression  Pt is a poor historian and no family was present at time of evaluation, thus PLOF and home set-up were difficult to ascertain.  Per pt, lives with her daughter in a one-level home with 3 STE.  It is unclear if there is a ramped entrance or what kind of equipment pt may have in the home (pt reports she has w/c, RW, BSC, but per CM notes, pt may only have cane and shower chair).   On evaluation, pt is deconditioned and significanly limited by decreased activity tolerance.  Pt requires mod A for bed mobility, min guard-mod A for sit <> stand using RW, and min-max A to maintain sitting balance.  Pt is very inconsistent with mobility, and demonstrates an overall poor safety awareness.  Pt was able to participate in a stand pivot transfer from bed > chair with min guard x2, but unable to progress gait 2/2 decreased ability to advance RLE and fatigue.  Pt's HR monitored throughout session and remained WFL (58 bpm at rest up to 77 bpm with transfer). Pt presents as a high fall risk, and would benefit from skilled acute and post acute PT to return to baseline level of functioning.  Recommend pt discharge to STR when medically appropriate. RN and CM notified.     Follow Up Recommendations SNF    Equipment Recommendations  Rolling walker with 5" wheels;3in1 (PT);Wheelchair (measurements PT) (pending what equipment pt has  in the home )    Recommendations for Other Services       Precautions / Restrictions Precautions Precautions: Fall Precaution Comments: Port-a-cath L chest Restrictions Weight Bearing Restrictions: No      Mobility  Bed Mobility Overal bed mobility: Needs Assistance Bed Mobility: Supine to Sit Supine to sit: Mod assist General bed mobility comments: Pt able to slide legs off EOB but requires mod A at trunk to assume supine > sit. In sitting, pt again requires mod A to scoot hips to EOB. Verbal cues required for technique and significantly increased time.  Transfers Overall transfer level: Needs assistance Equipment used: Rolling walker (2 wheeled) Transfers: Sit to/from [facility] Sit to Stand: Min guard;Min assist;Mod assist Stand pivot transfers: Min guard;Min assist;+2 physical assistance General transfer comment: Pt able to perform 3 sit <> stand transfers using RW, with decreasing assist each time from mod A down to CGA. Pt then able to perform stand pivot transfer to recliner on the R with min guard-min A x2 for safety. Pt requiring verbal and tactile cues for DME use, hand/foot placement, and sequencing. Pt demonstrates poor safety awareness, attempting to sit without warning and before fully turned to chair.  Ambulation/Gait General Gait Details: Pt unable to progress RLE for ambulation. "I'm too weak and I'm too tired"  Stairs Stairs:  (Unsafe to assess)  Wheelchair Mobility    Modified Rankin (Stroke Patients Only)       Balance Overall balance  assessment: Needs assistance Sitting-balance support: Bilateral upper extremity supported;Feet supported Sitting balance-Leahy Scale: Poor Sitting balance - Comments: Pt requiring fluctuating assist min-max to maintain balance sitting EOB with BUE/LE supported. Strong posterior lean. Postural control: Posterior lean Standing balance support: Bilateral upper extremity supported (on RW) Standing  balance-Leahy Scale: Poor Standing balance comment: Pt relies on BUE support on RW and min guard x2 for balance. Decreased safety awareness.      Pertinent Vitals/Pain Pain Assessment: No/denies pain  HR and O2 monitored throughout session and maintained WFL.     Home Living Family/patient expects to be discharged to:: Private residence Living Arrangements: Children Available Help at Discharge: Family Type of Home: House Home Access: Stairs to enter Entrance Stairs-Rails: Can reach both Entrance Stairs-Number of Steps: [place]: One level Home Equipment: [place] - 2 wheels;Wheelchair - manual   Prior Function Level of Independence: Needs assistance   Additional Comments: Pt with inconsistencies between information provided to PT and to RN staff, thus PLOF and home set up difficult to ascertain. Will attempt to follow up with family as able.         Extremity/Trunk Assessment   Upper Extremity Assessment: Generalized weakness  Lower Extremity Assessment: Generalized weakness (Light touch sensation appreciated throughout bilat LEs)  Cervical / Trunk Assessment: Normal;Kyphotic (unable to ascertain if kyphotic posturing is baseline or decreased ability to follow commands)    Communication   Communication: HOH  Cognition Arousal/Alertness: Awake/alert Behavior During Therapy: Impulsive Overall Cognitive Status: No family/caregiver present to determine baseline cognitive functioning    General Comments Nursing cleared pt for participation in physical therapy.  Pt agreeable to PT session.          Assessment/Plan    PT Assessment Patient needs continued PT services  PT Diagnosis Difficulty walking;Generalized weakness   PT Problem List Decreased strength;Decreased activity tolerance;Decreased balance;Decreased mobility;Decreased knowledge of use of DME;Decreased safety awareness  PT Treatment Interventions DME instruction;Gait training;Stair training;Functional  mobility training;Therapeutic activities;Therapeutic exercise;Balance training;Patient/family education   PT Goals (Current goals can be found in the Care Plan section) Acute Rehab PT Goals Patient Stated Goal: To return home PT Goal Formulation: With patient Time For Goal Achievement: 11/26/15 Potential to Achieve Goals: Fair    Frequency Min 2X/week   Barriers to discharge Assist levels, decreased safety awareness         End of Session Equipment Utilized During Treatment: Gait belt Activity Tolerance: Patient limited by fatigue Patient left: in chair;with call bell/phone within reach;with chair alarm set Nurse Communication: Mobility status;Precautions         Time: [id number] PT Time Calculation (min) (ACUTE ONLY): 38 min   Charges:         PT G Codes:        Oluwatimileyin Vivier, SPT 11/12/2015, 12:54 PM

## 2015-11-12 NOTE — Care Management (Signed)
Daughter requests a new shower chair because that the one they have is not working properly. This is not covered by Medicare. TC to daughter with no answer.

## 2015-11-12 NOTE — Progress Notes (Signed)
New referral for [facility] requesting skilled nursing, physical therapy, social worker and home health aide. Sandy [name] has received [facility] services in the past. She was admitted to [facility] directly from the [place] on 7/27 for evaluation and  treatment of melanotic stools and poor oral intake. She has a known history of stage IV colon cancer and is receiving treatment, last chemo on 7/18. She has had 2 ED visits in the past week and a previous admission at [facility] 7/7-7/11 for neutropenic fever and was discharged to [facility] . Patient and family have met with Palliative Care NP Sandy [name] to discuss goals of care. They have voiced the desire to continue treatment and wish to discharge with Sandy [name]. Patient is now a DNR, signed Out of facility DNR in place. Patient seen sitting up in bed, alert, pleasantly confused and interactive, anxious to go home. PT eval pending, of note patient has not been out of bed since admission writer spoke via telephone to patient's daughter Sandy [name], she voiced her understanding that her mother may need EMS for transport. CMRN Sandy [name] and attending physician Sandy [name] aware.  Updated notes, F2F and Home health orders  faxed to [facility] referral. Thank you. Sandy Shanks RN, BSN, [facility] Liaison [phone] c

## 2015-11-12 NOTE — Progress Notes (Signed)
Per Dr. Darvin Neighbours, flush port with heparin and deaccess needle/port before discharge.

## 2015-11-12 NOTE — Progress Notes (Signed)
Spoke with daughter, said she will be home around 2-3 this afternoon so we can call EMS for patient at that time. Will call to check with her prior to calling EMS this afternoon.

## 2015-11-12 NOTE — Care Management (Signed)
PT recommending SNF. Daughter refusing. She states she has been caring for her mother 24/7 and feels she can handle her and does not wan her in a SNF. Will have nursing arrange ambulance.

## 2015-11-12 NOTE — Care Management (Addendum)
Patient has a cane and a shower chair. Spoke with daughter who states she will transport patient home by car if she can walk. PT consult pending.

## 2015-11-12 NOTE — Progress Notes (Signed)
Discharge instructions along with home medications and follow up gone over with patient and daughter. Both verbalize that they understood instructions. Gave four prescriptions given to patient. IV and tele removed. Pt being discharged home on room air, no distress noted. DNR form sent with EMS. Ammie Dalton, RN

## 2015-11-12 NOTE — Care Management (Addendum)
It is anticipated that patient will be discharged home today followed by [facility]. Sandy [name] with [facility] updated

## 2015-11-12 NOTE — Progress Notes (Signed)
Patient no longer neutropenic per lab results and Dr. Kem Parkinson note.

## 2015-11-13 LAB — CEA: CEA: 523 ng/mL — ABNORMAL HIGH (ref 0.0–4.7)

## 2015-11-13 NOTE — Discharge Summary (Signed)
[facility] [place] NAME: Sandy [name]    MR#:  [id number]  DATE OF BIRTH:  [birth year]  DATE OF ADMISSION:  11/07/2015 ADMITTING PHYSICIAN: Vaughan Basta, MD  DATE OF DISCHARGE: 11/12/2015  5:00 PM  PRIMARY CARE PHYSICIAN: [name], MD   ADMISSION DIAGNOSIS:  met colon ca  altered mental status neutropenic  DISCHARGE DIAGNOSIS:  Principal Problem:   Altered mental status Active Problems:   Altered mental state   Metastasis ([place])   Antineoplastic chemotherapy induced pancytopenia (HCC)   Drug-induced neutropenia (HCC)   DNR (do not resuscitate)   Palliative care encounter   Weakness generalized   Other alteration of consciousness   Poisoning by unspecified drug or medicinal substance, undetermined whether accidentally or purposely inflicted(E980.5)   SECONDARY DIAGNOSIS:   Past Medical History:  Diagnosis Date  . Cancer ([place])    liver mets; colon cancer  . Constipation   . Diabetes mellitus without complication ([place])   . HOH (hard of hearing)   . Hypertension   . Insomnia   . 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 Sickle cell anemia (HCC)   . Wears dentures    partial upper and lower     ADMITTING HISTORY  HISTORY OF PRESENT ILLNESS: Sandy [name]  is a 70 y.o. female with a known history of Stage IV colon cancer on chemotherapy, last chemotherapy 10 days ago . Recent admission for neutropenic fever.  She was seen in the ER on 11/05/2015 with right sided abdominal pain. Abdominal and pelvic CT scan revealed no significant change in hepatic metastasis. There was fullness at the junction of the descending colon and proximal sigmoid colon similar to prior scans. Labs included a hematocrit of 24.5, hemoglobin 8.4, platelets 243,000, WBC 800 with an ANC of 300. She received IVF and was discharged back to [facility].  She was seen in the ER on 11/06/2015 with altered  mental status (somnolence). Head CT was negative. CXR was negative. Labs included a creatinine of 1.23. CBC revealed a hematocrit of 26.3, hemoglobin 8.7, WBC was 500 with an ANC of 100. Her family brought her home.  Her family [facility] today secondary to 2 episodes of melanotic stool. She has been non-verbal (only said "no" once today). She has had very little to eat or drink. Her daughter was able to get her AM medications in. She has had no fever, nausea or vomiting. She does not complain of pain.  As pt was drowsy and dehydrated, Dr. Osvaldo Shipper suggested for direct admission.  HOSPITAL COURSE:   70 yo female with history of stage 4 colon ca with liver mets on chemo. Now with altered mental status, and neutopenia with sepsis.   * Klebsiella bacteremia with likely source is UTI and neutropenia - present on admission. - Initially treated with cefepime and later changed to ceftriaxone in the hospital. Will treat with Ceftin as outpatient. Neutropenia resolved  * A.fib with RVR - Cardizem drip stopped. Patient was on Cardizem and digoxin in the hospital. But improved well with oral metoprolol. - Discussed with Dr. Clayborn Bigness and suggested increasing amiodarone dose.  Discharge home on amiodarone, metoprolol.  * Acute Metabolic encephalopathy - Improved - Multifactorial - Normal Ammonia -  likely due to Sepsis - MRI was motion degraded but did not show anything acute  * Pancytopenia - Improving - Due to chemo - She was given neulasta 11/01/15  as per oncology notes.  * Diabetes mellitus type 2 SSI  * Metastatic colon cancer getting chemotherapy Oncology input appreciated Palliative care consulted. Patient is DO NOT RESUSCITATE.  * Essential hypertension On cardizem  * Ac renal insufficiency Due to dehydration Resolved  DVT prophylaxis SCDs due to thrombocytoepnia  Stable for discharge home to follow-up with oncology and  cardiology as outpatient.  CONSULTS OBTAINED:  Treatment Team:  Yolonda Kida, MD Lloyd Huger, MD  DRUG ALLERGIES:   Allergies  Allergen Reactions  . Tylenol [Acetaminophen] Nausea And Vomiting    DISCHARGE MEDICATIONS:   Discharge Medication List as of 11/12/2015  2:30 PM    START taking these medications   Details  amiodarone (PACERONE) 400 MG tablet Take 1 tablet (400 mg total) by mouth daily., Starting Tue 11/12/2015, Print    metoprolol (LOPRESSOR) 50 MG tablet Take 1 tablet (50 mg total) by mouth 2 (two) times daily., Starting Tue 11/12/2015, Normal      CONTINUE these medications which have CHANGED   Details  cefUROXime (CEFTIN) 500 MG tablet Take 0.5 tablets (250 mg total) by mouth 2 (two) times daily with a meal., Starting Tue 11/12/2015, Print      CONTINUE these medications which have NOT CHANGED   Details  aspirin EC 81 MG tablet Take 81 mg by mouth daily. Reported on 08/26/2015, Until Discontinued, Historical Med    atorvastatin (LIPITOR) 40 MG tablet Take 40 mg by mouth daily., Until Discontinued, Historical Med    docusate sodium (COLACE) 100 MG capsule Take 1 capsule (100 mg total) by mouth 2 (two) times daily., Starting 10/22/2015, Until Discontinued, Normal    feeding supplement, ENSURE ENLIVE, (ENSURE ENLIVE) LIQD Take 237 mLs by mouth 2 (two) times daily between meals., Starting 10/22/2015, Until Discontinued, Normal    gabapentin (NEURONTIN) 100 MG capsule Take 2 capsules (200 mg total) by mouth at bedtime. 1 tab daily and 2 tabs at bedtime., Starting 07/16/2015, Until Discontinued, Normal    lidocaine-prilocaine (EMLA) cream Apply 1 application topically as needed. Apply to port 1 hour prior to chemotherapy appointment. Cover with plastic wrap., Starting 07/08/2015, Until Discontinued, Normal    lisinopril (PRINIVIL,ZESTRIL) 10 MG tablet Take 10 mg by mouth daily., Until Discontinued, Historical Med    magnesium 30 MG tablet Take 30 mg by mouth daily.  , Until Discontinued, Historical Med    megestrol (MEGACE) 400 MG/10ML suspension Take 5 mLs (200 mg total) by mouth daily. to stimulate appetite, Starting 11/06/2014, Until Discontinued, Normal    metFORMIN (GLUCOPHAGE) 1000 MG tablet Take 1,000 mg by mouth 2 (two) times daily. , Until Discontinued, Historical Med    Multiple Vitamin (MULTIVITAMIN WITH MINERALS) TABS tablet Take 1 tablet by mouth daily., Until Discontinued, Historical Med    ondansetron (ZOFRAN) 4 MG tablet Take 1 tablet (4 mg total) by mouth every 8 (eight) hours as needed for nausea or vomiting., Starting 12/13/2014, Until Discontinued, Normal    oxyCODONE (OXY IR/ROXICODONE) 5 MG immediate release tablet Take 1 tablet (5 mg total) by mouth every 6 (six) hours as needed for severe pain., Starting 10/22/2015, Until Discontinued, Print    traZODone (DESYREL) 50 MG tablet Take 0.5 tablets (25 mg total) by mouth at bedtime as needed for sleep., Starting 10/22/2015, Until Discontinued, Print      STOP taking these medications     amLODipine (NORVASC) 5 MG tablet      diltiazem (CARDIZEM) 60 MG tablet  Today   VITAL SIGNS:  Blood pressure (!) 141/60, pulse (!) 56, temperature 98.1 F (36.7 C), temperature source Oral, resp. rate 18, height 5' 6" (1.676 m), weight 61 kg (134 lb 7.7 oz), SpO2 100 %.  I/O:  No intake or output data in the 24 hours ending 11/13/15 1423  PHYSICAL EXAMINATION:  Physical Exam  GENERAL:  70 y.o.-year-old patient lying in the bed with no acute distress.  LUNGS: Normal breath sounds bilaterally, no wheezing, rales,rhonchi or crepitation. No use of accessory muscles of respiration.  CARDIOVASCULAR: S1, S2 normal. No murmurs, rubs, or gallops.  ABDOMEN: Soft, non-tender, non-distended. Bowel sounds present. No organomegaly or mass.  NEUROLOGIC: Moves all 4 extremities. SKIN: No obvious rash, lesion, or ulcer.   DATA REVIEW:   CBC  Recent Labs Lab 11/12/15 0623  WBC 8.9  HGB 8.5*   HCT 24.7*  PLT 221    Chemistries   Recent Labs Lab 11/09/15 0449  11/10/15 0454  11/12/15 0623  NA 141  --  138  < > 142  K 2.9*  < > 2.7*  < > 3.7  CL 117*  --  114*  < > 116*  CO2 18*  --  19*  < > 21*  GLUCOSE 110*  --  100*  < > 140*  BUN 11  --  <5*  < > 6  CREATININE 0.83  --  0.63  < > 0.70  CALCIUM 8.7*  --  8.5*  < > 9.1  MG 1.5*  --  1.7  --   --   AST 11*  --   --   --   --   ALT 11*  --   --   --   --   ALKPHOS 68  --   --   --   --   BILITOT 0.8  --   --   --   --   < > = values in this interval not displayed.  Cardiac Enzymes  Recent Labs Lab 11/06/15 1924  TROPONINI <0.03    Microbiology Results  Results for orders placed or performed in visit on 11/07/15  Culture, blood (routine x 2)     Status: None   Collection Time: 11/07/15  1:24 PM  Result Value Ref Range Status   Specimen Description BLOOD LEFT ARM  Final   Special Requests BOTTLES DRAWN AEROBIC AND ANAEROBIC 8 CC  Final   Culture NO GROWTH 5 DAYS  Final   Report Status 11/12/2015 FINAL  Final  Culture, blood (routine x 2)     Status: Abnormal   Collection Time: 11/07/15  1:35 PM  Result Value Ref Range Status   Specimen Description BLOOD PORTA CATH  Final   Special Requests BOTTLES DRAWN AEROBIC AND ANAEROBIC 8 CC  Final   Culture  Setup Time   Final    GRAM NEGATIVE RODS ANAEROBIC BOTTLE ONLY CRITICAL RESULT CALLED TO, READ BACK BY AND VERIFIED WITH: MATT MCBANE @ 0630 ON 11/08/2015 BY CAF    Culture KLEBSIELLA PNEUMONIAE (A)  Final   Report Status 11/10/2015 FINAL  Final   Organism ID, Bacteria KLEBSIELLA PNEUMONIAE  Final      Susceptibility   Klebsiella pneumoniae - MIC*    AMPICILLIN 16 RESISTANT Resistant     CEFAZOLIN <=4 SENSITIVE Sensitive     CEFEPIME <=1 SENSITIVE Sensitive     CEFTAZIDIME <=1 SENSITIVE Sensitive     CEFTRIAXONE <=1 SENSITIVE Sensitive     CIPROFLOXACIN <=  0.25 SENSITIVE Sensitive     GENTAMICIN <=1 SENSITIVE Sensitive     IMIPENEM <=0.25 SENSITIVE  Sensitive     TRIMETH/SULFA <=20 SENSITIVE Sensitive     AMPICILLIN/SULBACTAM 4 SENSITIVE Sensitive     PIP/TAZO <=4 SENSITIVE Sensitive     Extended ESBL NEGATIVE Sensitive     * KLEBSIELLA PNEUMONIAE  Blood Culture ID Panel (Reflexed)     Status: Abnormal   Collection Time: 11/07/15  1:35 PM  Result Value Ref Range Status   Enterococcus species NOT DETECTED NOT DETECTED Final   Vancomycin resistance NOT DETECTED NOT DETECTED Final   Listeria monocytogenes NOT DETECTED NOT DETECTED Final   Staphylococcus species NOT DETECTED NOT DETECTED Final   Staphylococcus aureus NOT DETECTED NOT DETECTED Final   Methicillin resistance NOT DETECTED NOT DETECTED Final   Streptococcus species NOT DETECTED NOT DETECTED Final   Streptococcus agalactiae NOT DETECTED NOT DETECTED Final   Streptococcus pneumoniae NOT DETECTED NOT DETECTED Final   Streptococcus pyogenes NOT DETECTED NOT DETECTED Final   Acinetobacter baumannii NOT DETECTED NOT DETECTED Final   Enterobacteriaceae species DETECTED (A) NOT DETECTED Final    Comment: CRITICAL RESULT CALLED TO, READ BACK BY AND VERIFIED WITH: MATT MCBANE @ 0630 ON 11/08/2015 BY CAF    Enterobacter cloacae complex NOT DETECTED NOT DETECTED Final   Escherichia coli NOT DETECTED NOT DETECTED Final   Klebsiella oxytoca NOT DETECTED NOT DETECTED Final   Klebsiella pneumoniae DETECTED (A) NOT DETECTED Final    Comment: CRITICAL RESULT CALLED TO, READ BACK BY AND VERIFIED WITH: MATT MCBANE @ 0630 ON 11/08/2015 BY CAF    Proteus species NOT DETECTED NOT DETECTED Final   Serratia marcescens NOT DETECTED NOT DETECTED Final   Carbapenem resistance NOT DETECTED NOT DETECTED Final   Haemophilus influenzae NOT DETECTED NOT DETECTED Final   Neisseria meningitidis NOT DETECTED NOT DETECTED Final   Pseudomonas aeruginosa NOT DETECTED NOT DETECTED Final   Candida albicans NOT DETECTED NOT DETECTED Final   Candida glabrata NOT DETECTED NOT DETECTED Final   Candida krusei  NOT DETECTED NOT DETECTED Final   Candida parapsilosis NOT DETECTED NOT DETECTED Final   Candida tropicalis NOT DETECTED NOT DETECTED Final    RADIOLOGY:  No results found.  Follow up with PCP in 1 week.  Management plans discussed with the patient, family and they are in agreement.  CODE STATUS:  Code Status History    Date Active Date Inactive Code Status Order ID Comments User Context   11/11/2015  8:03 AM 11/12/2015  8:20 PM DNR [id number]  Hillary Bow, MD Inpatient   11/08/2015  2:54 PM 11/08/2015  5:44 PM DNR [id number]  Awilda Bill, NP Inpatient   11/07/2015  6:17 PM 11/08/2015  2:54 PM Full Code [id number]  Vaughan Basta, MD Inpatient   08/28/2014  4:37 PM 08/29/2014  8:42 PM Full Code [id number]  Theodoro Grist, MD Inpatient    Questions for Most Recent Historical Code Status (Order [id number])    Question Answer Comment   In the event of cardiac or respiratory ARREST Do not call a "code blue"    In the event of cardiac or respiratory ARREST Do not perform Intubation, CPR, defibrillation or ACLS    In the event of cardiac or respiratory ARREST Use medication by any route, position, wound care, and other measures to relive pain and suffering. May use oxygen, suction and manual treatment of airway obstruction as needed for comfort.         Advance  Directive Documentation   Flowsheet Row Most Recent Value  Type of Advance Directive  Healthcare Power of Attorney  Pre-existing out of facility DNR order (yellow form or pink MOST form)  No data  "MOST" Form in Place?  No data      TOTAL TIME TAKING CARE OF THIS PATIENT ON DAY OF DISCHARGE: more than 30 minutes.   Hillary Bow R M.D on 11/13/2015 at 2:23 PM  Between 7am to 6pm - Pager - [phone]  After 6pm go to [email or web address] - password [email or web address]  Office  [phone]  CC: Primary care physician; [name], MD  Note: This dictation was prepared with Dragon dictation along with  smaller phrase technology. Any transcriptional errors that result from this process are unintentional.

## 2015-11-18 ENCOUNTER — Telehealth: Payer: Self-pay

## 2015-11-18 ENCOUNTER — Telehealth: Payer: Self-pay | Admitting: *Deleted

## 2015-11-18 DIAGNOSIS — C787 Secondary malignant neoplasm of liver and intrahepatic bile duct: Principal | ICD-10-CM

## 2015-11-18 DIAGNOSIS — C189 Malignant neoplasm of colon, unspecified: Secondary | ICD-10-CM

## 2015-11-18 NOTE — Telephone Encounter (Signed)
Inquiring when Sandy [name] is to return and if she will get more chemo, She missed appt 8/1 due to being in hospital and has no FU appt scheduled. Pleas advise

## 2015-11-18 NOTE — Telephone Encounter (Signed)
-----   Message from Luella Cook, RN sent at 11/15/2015  3:29 PM EDT ----- Regarding: FW: Please notify patient and/or daughter   ----- Message ----- From: Lequita Asal, MD Sent: 11/14/2015   5:29 PM To: Luella Cook, RN Subject: Please notify patient and/or daughter           Dramatic drop in CEA.  M  ----- Message ----- From: Interface, Lab In Echo Sent: 11/13/2015   8:42 AM To: Lequita Asal, MD

## 2015-11-18 NOTE — Telephone Encounter (Signed)
Called and left message on voicemail and they called today to let us know they did get message

## 2015-11-18 NOTE — Telephone Encounter (Signed)
Per Dr Mike Gip, CBC, CMP, MG+, see MD next week and possible chemo the day after MD appt. Message sent to schedulers

## 2015-11-18 NOTE — Telephone Encounter (Signed)
Tried calling [facility] on number listed in chart, no answer and voicemail has not been set p yet, try again later.

## 2015-11-18 NOTE — Telephone Encounter (Signed)
Called and spoke with pt's daughter and let them know that the FMLA Sandy [name] dropped oss is completed and has been faxed in today with confirmation.  Also, let Sandy [name] know that CEA a month ago was [phone] and on 8/1 it was 523.0.  Sandy [name] expressed a grattiutde for calling and stated they would be coming in soon to see Sandy [name].  No other concerns noted.

## 2015-11-24 NOTE — Progress Notes (Signed)
[facility] day:  11/25/15   Chief Complaint: Sandy [name] is a 70 y.o. female with stage IV colon cancer who is seen for reassessment after interval hospitalization.  HPI: The patient was last seen in the medical oncology clinic on 11/07/2015.  At that time, she was seen for a sick call visit.   She had altered mental status.  Her family described 2 melanotic stools today.  She was neutropenic (ANC 0) and hypothermic.  Oral intake was poor. She was admitted to the hospital.  She was admitted to [facility] from 11/07/2015 -  11/12/2015.  She was diagnosed with Klebsiella bacteremia.  She was initially treated with Cefepime then switched to ceftriaxone then discharged on Ceftin.  She had atrial fibrillation with RVR.  She was initially treated with cardizem and digoxin then oral metoprolol.  She was discharged on amiodarone.  CBC on 11/12/2015 revealed a hematocrit of 24.7, hemoglobin 8.5, MCV 84.6, platelets 221,000, WBC 8900 with an ANC of 7500.  CEA was 523.0 on 11/12/2015.   Symptomatically, she is active and doing things. He is walking with a walker and receiving physical therapy. She is eating well.   Bowel movements are described as "so-so".  Heart rate remains low .  She has a follow-up cardiology appointment on 12/05/2015 with Dr. Nehemiah Massed.  At times, she is dizzy.  She is taking one oxycodone in the morning.    Past Medical History:  Diagnosis Date  . Cancer ([place])    liver mets; colon cancer  . Constipation   . Diabetes mellitus without complication ([place])   . HOH (hard of hearing)   . Hypertension   . Insomnia   . 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 Sickle cell anemia ([facility])   . Wears dentures    partial upper and lower    Past Surgical History:  Procedure Laterality Date  . CARDIAC CATHETERIZATION    . COLONOSCOPY WITH PROPOFOL N/A 09/07/2014   Procedure: COLONOSCOPY WITH PROPOFOL;  Surgeon: Lucilla Lame, MD;   Location: [facility] ENDOSCOPY;  Service: Endoscopy;  Laterality: N/A;  . ESOPHAGOGASTRODUODENOSCOPY N/A 09/07/2014   Procedure: ESOPHAGOGASTRODUODENOSCOPY (EGD);  Surgeon: Lucilla Lame, MD;  Location: [facility] ENDOSCOPY;  Service: Endoscopy;  Laterality: N/A;  . FLEXIBLE SIGMOIDOSCOPY N/A 09/02/2015   Procedure: FLEXIBLE SIGMOIDOSCOPY;  Surgeon: Lucilla Lame, MD;  Location: [place];  Service: Endoscopy;  Laterality: N/A;  Diabetic - oral meds Pt has port-a-cath  . LIVER BIOPSY    . PORTACATH PLACEMENT Left 09/24/2014   Procedure: INSERTION PORT-A-CATH;  Surgeon: Robert Bellow, MD;  Location: [facility] ORS;  Service: General;  Laterality: Left;    Family History  Problem Relation Age of Onset  . Cancer Sister     Social History:  reports that she quit smoking about 2 years ago. Her smoking use included Cigarettes. She has a 5.00 pack-year smoking history. She has never used smokeless tobacco. She reports that she does not drink alcohol or use drugs.  The patient has recently been a resident of [facility].  The patient is accompanied by her daughter, [name].  The patient's medical power of attorney is her daughter.   Allergies:  Allergies  Allergen Reactions  . Tylenol [Acetaminophen] Nausea And Vomiting    Current Medications: Current Outpatient Prescriptions  Medication Sig Dispense Refill  . amiodarone (PACERONE) 400 MG tablet Take 1 tablet (400 mg total) by mouth daily. 30 tablet  0  . aspirin EC 81 MG tablet Take 81 mg by mouth daily. Reported on 08/26/2015    . atorvastatin (LIPITOR) 40 MG tablet Take 40 mg by mouth daily.    . cefUROXime (CEFTIN) 500 MG tablet Take 0.5 tablets (250 mg total) by mouth 2 (two) times daily with a meal. 20 tablet 0  . docusate sodium (COLACE) 100 MG capsule Take 1 capsule (100 mg total) by mouth 2 (two) times daily. 10 capsule 0  . feeding supplement, ENSURE ENLIVE, (ENSURE ENLIVE) LIQD Take 237 mLs by mouth 2 (two) times daily between meals. 237 mL  12  . gabapentin (NEURONTIN) 100 MG capsule Take 2 capsules (200 mg total) by mouth at bedtime. 1 tab daily and 2 tabs at bedtime. 60 capsule 0  . lidocaine-prilocaine (EMLA) cream Apply 1 application topically as needed. Apply to port 1 hour prior to chemotherapy appointment. Cover with plastic wrap. 30 g 1  . lisinopril (PRINIVIL,ZESTRIL) 10 MG tablet Take 10 mg by mouth daily.    . magnesium 30 MG tablet Take 30 mg by mouth daily.     . megestrol (MEGACE) 400 MG/10ML suspension Take 5 mLs (200 mg total) by mouth daily. to stimulate appetite 240 mL 0  . metFORMIN (GLUCOPHAGE) 1000 MG tablet Take 1,000 mg by mouth 2 (two) times daily.     . metoprolol (LOPRESSOR) 50 MG tablet Take 1 tablet (50 mg total) by mouth 2 (two) times daily. 60 tablet 0  . Multiple Vitamin (MULTIVITAMIN WITH MINERALS) TABS tablet Take 1 tablet by mouth daily.    . ondansetron (ZOFRAN) 4 MG tablet Take 1 tablet (4 mg total) by mouth every 8 (eight) hours as needed for nausea or vomiting. 30 tablet 2  . oxyCODONE (OXY IR/ROXICODONE) 5 MG immediate release tablet Take 1 tablet (5 mg total) by mouth every 6 (six) hours as needed for severe pain. 20 tablet 0  . traZODone (DESYREL) 50 MG tablet Take 0.5 tablets (25 mg total) by mouth at bedtime as needed for sleep. 20 tablet 0   No current facility-administered medications for this visit.    Facility-Administered Medications Ordered in Other Visits  Medication Dose Route Frequency Provider Last Rate Last Dose  . acetaminophen (TYLENOL) tablet 650 mg  650 mg Oral Once Lequita Asal, MD      . sodium chloride 0.9 % injection 10 mL  10 mL Intracatheter PRN Lequita Asal, MD   10 mL at 10/11/14 1431  . sodium chloride 0.9 % injection 10 mL  10 mL Intracatheter PRN Lequita Asal, MD   10 mL at 02/28/15 1529    Review of Systems:  GENERAL: Feels better.  No fevers.  Weight up 4 pounds. PERFORMANCE STATUS (ECOG): 2. HEENT: Wears a hearing aide. No visual  changes, runny nose, sore throat, mouth sores or tenderness. Lungs: No shortness of breath or cough. No hemoptysis. Cardiac: Heart rate slow.  No chest pain, palpitations, orthopnea, or PND. GI: Eating better.Bowel movements "so so".  No nausea, vomiting, diarrhea, melena or hematochezia. GU: No dysuria or hematuria. Musculoskeletal: Denies back pain. No joint pain. No muscle tenderness. Extremities: No pain or swelling. Skin: No rashes, skin lesions, or ulcers Neuro: Sometimes dizzy.  No headache, numbness or weakness, or balance issues. Endocrine: Diabetes. No thyroid issues, hot flashes or night sweats. Psych: No mood changes or anxiety. Pain: Takes oxycodone in the AM. Review of systems: All other systems   Physical Exam: Blood pressure 119/69, pulse Marland Kitchen)  46, temperature 97.2 F (36.2 C), temperature source Tympanic, resp. rate 18, weight 147 lb 2.5 oz (66.7 kg). GENERAL: Elderly woman sitting comfortably in a wheelchair in no acute distress. MENTAL STATUS: Alert and oriented to person, place and time. HEAD: Head wrap. Normocephalic, atraumatic, face symmetric, no Cushingoid features. EYES: Black rimmed glasses. Hazel/green eyes. Pupils equal round and reactive to light and accomodation. No conjunctivitis or scleral icterus. ENT: Oropharynx clear without lesion. Missing several teeth. Tongue normal. Mucous membranes moist.  RESPIRATORY: Clear to auscultation without rales, wheezes or rhonchi. CARDIOVASCULAR: Bradycardia.  Regular rate and rhythm without murmur, rub or gallop. ABDOMEN: Soft, non-tender with active bowel sounds. No guarding or rebound tenderness. No hepatosplenomegaly. SKIN: No rashes, ulcer or skin lesions.  EXTREMITIES: No edema, no skin discoloration or tenderness. No palpable cords. LYMPH NODES: Right low anterior cervical 2-3 cm soft mass (thyroid). No palpable supraclavicular, axillary or inguinal adenopathy  NEUROLOGICAL:  Appropriate. PSYCH: Affect normal.  Appointment on 11/25/2015  Component Date Value Ref Range Status  . WBC 11/25/2015 15.7* 3.6 - 11.0 K/uL Final  . RBC 11/25/2015 3.17* 3.80 - 5.20 MIL/uL Final  . Hemoglobin 11/25/2015 9.4* 12.0 - 16.0 g/dL Final  . HCT 11/25/2015 28.2* 35.0 - 47.0 % Final  . MCV 11/25/2015 88.9  80.0 - 100.0 fL Final  . MCH 11/25/2015 29.6  26.0 - 34.0 pg Final  . MCHC 11/25/2015 33.3  32.0 - 36.0 g/dL Final  . RDW 11/25/2015 18.8* 11.5 - 14.5 % Final  . Platelets 11/25/2015 528* 150 - 440 K/uL Final  . Neutrophils Relative % 11/25/2015 87  % Final  . Neutro Abs 11/25/2015 13.6* 1.4 - 6.5 K/uL Final  . Lymphocytes Relative 11/25/2015 7  % Final  . Lymphs Abs 11/25/2015 1.2  1.0 - 3.6 K/uL Final  . Monocytes Relative 11/25/2015 5  % Final  . Monocytes Absolute 11/25/2015 0.8  0.2 - 0.9 K/uL Final  . Eosinophils Relative 11/25/2015 0  % Final  . Eosinophils Absolute 11/25/2015 0.0  0 - 0.7 K/uL Final  . Basophils Relative 11/25/2015 1  % Final  . Basophils Absolute 11/25/2015 0.1  0 - 0.1 K/uL Final  . Sodium 11/25/2015 141  135 - 145 mmol/L Final  . Potassium 11/25/2015 4.5  3.5 - 5.1 mmol/L Final  . Chloride 11/25/2015 113* 101 - 111 mmol/L Final  . CO2 11/25/2015 23  22 - 32 mmol/L Final  . Glucose, Bld 11/25/2015 90  65 - 99 mg/dL Final  . BUN 11/25/2015 9  6 - 20 mg/dL Final  . Creatinine, Ser 11/25/2015 0.82  0.44 - 1.00 mg/dL Final  . Calcium 11/25/2015 8.7* 8.9 - 10.3 mg/dL Final  . Total Protein 11/25/2015 5.8* 6.5 - 8.1 g/dL Final  . Albumin 11/25/2015 2.7* 3.5 - 5.0 g/dL Final  . AST 11/25/2015 15  15 - 41 U/L Final  . ALT 11/25/2015 8* 14 - 54 U/L Final  . Alkaline Phosphatase 11/25/2015 96  38 - 126 U/L Final  . Total Bilirubin 11/25/2015 0.6  0.3 - 1.2 mg/dL Final  . GFR calc non Af Amer 11/25/2015 >60  >60 mL/min Final  . GFR calc Af Amer 11/25/2015 >60  >60 mL/min Final   Comment: (NOTE) The eGFR has been calculated using the CKD EPI  equation. This calculation has not been validated in all clinical situations. eGFR's persistently <60 mL/min signify possible Chronic Kidney Disease.   . Anion gap 11/25/2015 5  5 - 15 Final  Assessment:  ADELE MILSON is a 70 y.o. female with metastatic colon cancer. She presented with a 53 pound weight loss in 6 months. Colonoscopy on 09/07/2014 revealed a circumferential partially obstructive fungating mass in the descending colon. There was no active bleeding. There was a 1.5 cm pedunculated polyp in the sigmoid colon (tubulovillous adenoma). Pathology from the descending colon mass revealed fragments of adenocarcinoma. There was not enough tissue for KRAS testing.  CEA was 11,793 on 09/05/2014.  KRAS and NRAS mutational analysis on 09/02/2015 was incomplete.  KRAS results from analysis of codons 59, 61, and 117 were negative.  Results were not obtained for codons 12, 13, and 146.  NRAS gene results from analysis of codons 12 and 13 were negative.  Results were not obtained for codons 59, 69, 61, 117, and 146.  Chest CT angiogram on 08/28/2014 revealed no evidence of pulmonary embolism, but multiple large enhancing liver masses (largest 6.4 cm).  PET scan on 10/04/2014 revealed 6.5 x 6.1 cm hypermetabolic mass in the proximal sigmoid colon,  There were  numerous large hypermetabolic hepatic metastases. There were no additional sites of metastatic disease in the neck, chest or skeleton.  In addition, there was a 3.2 x 2.8 cm low-attenuation right thyroid nodule.   She is status post 14 cycles of FOLFOX chemotherapy (10/09/2014 - 04/11/2015).  She declined Avastin.  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    CEA was 10,169 on 10/09/2014 , 12,034 on 0712/2016, 7765 on 11/20/2014, 5154 on 12/04/2014, 1921 on 01/01/2015, 1266 on 01/15/2015, 770.6 on 01/29/2015, 502.7 on 02/12/2015, 368.7 on 02/26/2015, 269 on 03/14/2015, 178.3 on 03/28/2015, 135.9 on 04/11/2015,  116.1 on 05/13/2015, 90.8 on 06/10/2015, 145.9 on 07/08/2015, 300.7 on 08/05/2015, 296.3 on 08/19/2015, and 443.0 on 09/24/2015, 1341 on 10/08/2015, and 523 on 11/12/2015.  Abdominal and pelvic CT scan on 12/28/2014 revealed response to therapy.  Liver metastases and the descending/sigmoid mass were smaller.  There was no new disease.  Abdomen and pelvic CT scan on 03/25/2015 revealed interval decrease in size of hepatic metastasis and distal descending colon mass.   Abdominal and pelvic CT scan on 03/25/2015 revealed decreasing hepatic metastasis and distal descending colon mass.  Right hepatic lobe lesion was 2.2 x 2.1 cm.  Left lateral hepatic lobe lesion was 1.9 x 1.5 cm.  There was stable thickening of the adrenal glands.  She is status post 8 cycles of 5-fluorouracil and leucovorin (04/28/2014 - 09/24/2015).  Cycle #4 was held on 06/10/2015 secondary to rectal bleeding.  Abdominal and pelvic MRI on 08/01/2015 revealed multiple hypoenhancing hepatic metastasis in both lobes of the liver (3.3 x 2.2 cm in segment VII, 5.9 x 4.1 cm in segment VIII, and a 2.4 x 1.7 cm caudate lesion).  There was a 4.3 cm length of focally narrowed sigmoid colon.  There was nodular thickening of the right adrenal gland.  PET scan on 08/21/2015 revealed interval improvement in the multifocal hepatic metastatic disease. There was residual hypermetabolic tumor.  The primary colonic lesion at the junction of the descending and sigmoid colon had decreased in size and metabolic activity.  There was persistent hypermetabolic activity in the lower right neck, possibly associated with the thyroid gland.   Abdominal and pelvic CT scan on 10/04/2015 revealed increased bulkiness of soft tissue mass in distal descending colon compared with previous studies, consistent with primary colon carcinoma.  The mass measured 4.7 cm compared to 3.3 cm.  The liver metastasis were felt larger  by second radiology review.  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  Afirma gene expression classifier GEC) was suspicious.  Gene expression signature for medullary thyroid cancer (MTC) and BRAF V600E were negative.  She is s/p 2 cycles of FOLFIRI chemotherapy (10/08/2015 - 10/29/2015).  Cycle #1 complicated by an admission for fever and neutropenia on day 11 (10/18/2015).  All cultures were negative.  Cycle #2 was complicated by fever and neutropenia (ANC 0) despite Neulasta.  She was diagnosed with Klebsiella pneumonia bacteremia.  She has history of atrial fibrillation with RVR.  She is on metoprolol and amiodarone.  She is bradycardic.   Symptomatically, she is feeling better.  Exam is near baseline.  Plan: 1.  Labs today:  CBC with diff, CMP. 2.  Review recent hospitalization and toxicity with reduced dose FOLFIRI despite Neulasta. 3.  Consider 5FU and Cetuximab.  Discussed send out testing (Misc test [id number] ALPHA GAL) for IgE antibodies to galactose-alpha-1,3 galactose (oligosaccharide part on Fab portion of the cetuximab heavy chain).  A positive result is suggestive of potential cetuximab reaction. 4.  Review dosing of irinotecan with pharmacy.  Consider dose level -2 irinotecan in FOLFIRI based on fever and neutropenia (sepsis) with Neulasta support. 5.  Discuss additional time to recover from recent hospitalization.  Discuss reconvening in 2 weeks to formalize treatment plan. 6.  RTC in 2 weeks for MD assessment and labs (CBC with diff, CMP, CEA).   Lequita Asal, MD  11/25/15, 11:13 AM

## 2015-11-25 ENCOUNTER — Inpatient Hospital Stay: Payer: Medicare Other | Attending: Hematology and Oncology | Admitting: Hematology and Oncology

## 2015-11-25 ENCOUNTER — Other Ambulatory Visit: Payer: Self-pay | Admitting: *Deleted

## 2015-11-25 ENCOUNTER — Inpatient Hospital Stay: Payer: Medicare Other

## 2015-11-25 ENCOUNTER — Encounter: Payer: Self-pay | Admitting: Hematology and Oncology

## 2015-11-25 VITALS — BP 119/69 | HR 46 | Temp 97.2°F | Resp 18 | Wt 147.2 lb

## 2015-11-25 DIAGNOSIS — D571 Sickle-cell disease without crisis: Secondary | ICD-10-CM | POA: Insufficient documentation

## 2015-11-25 DIAGNOSIS — Z8701 Personal history of pneumonia (recurrent): Secondary | ICD-10-CM | POA: Diagnosis not present

## 2015-11-25 DIAGNOSIS — R918 Other nonspecific abnormal finding of lung field: Secondary | ICD-10-CM | POA: Diagnosis not present

## 2015-11-25 DIAGNOSIS — Z809 Family history of malignant neoplasm, unspecified: Secondary | ICD-10-CM | POA: Diagnosis not present

## 2015-11-25 DIAGNOSIS — G629 Polyneuropathy, unspecified: Secondary | ICD-10-CM | POA: Insufficient documentation

## 2015-11-25 DIAGNOSIS — R634 Abnormal weight loss: Secondary | ICD-10-CM | POA: Diagnosis not present

## 2015-11-25 DIAGNOSIS — D709 Neutropenia, unspecified: Secondary | ICD-10-CM | POA: Diagnosis not present

## 2015-11-25 DIAGNOSIS — C187 Malignant neoplasm of sigmoid colon: Secondary | ICD-10-CM

## 2015-11-25 DIAGNOSIS — R001 Bradycardia, unspecified: Secondary | ICD-10-CM | POA: Diagnosis not present

## 2015-11-25 DIAGNOSIS — Z7982 Long term (current) use of aspirin: Secondary | ICD-10-CM | POA: Diagnosis not present

## 2015-11-25 DIAGNOSIS — R42 Dizziness and giddiness: Secondary | ICD-10-CM | POA: Diagnosis not present

## 2015-11-25 DIAGNOSIS — K5900 Constipation, unspecified: Secondary | ICD-10-CM | POA: Diagnosis not present

## 2015-11-25 DIAGNOSIS — E041 Nontoxic single thyroid nodule: Secondary | ICD-10-CM | POA: Diagnosis not present

## 2015-11-25 DIAGNOSIS — C787 Secondary malignant neoplasm of liver and intrahepatic bile duct: Secondary | ICD-10-CM | POA: Insufficient documentation

## 2015-11-25 DIAGNOSIS — Z9221 Personal history of antineoplastic chemotherapy: Secondary | ICD-10-CM | POA: Insufficient documentation

## 2015-11-25 DIAGNOSIS — I4891 Unspecified atrial fibrillation: Secondary | ICD-10-CM | POA: Insufficient documentation

## 2015-11-25 DIAGNOSIS — Z87891 Personal history of nicotine dependence: Secondary | ICD-10-CM | POA: Insufficient documentation

## 2015-11-25 DIAGNOSIS — Z79899 Other long term (current) drug therapy: Secondary | ICD-10-CM | POA: Insufficient documentation

## 2015-11-25 DIAGNOSIS — Z7984 Long term (current) use of oral hypoglycemic drugs: Secondary | ICD-10-CM | POA: Diagnosis not present

## 2015-11-25 DIAGNOSIS — R970 Elevated carcinoembryonic antigen [CEA]: Secondary | ICD-10-CM | POA: Diagnosis not present

## 2015-11-25 DIAGNOSIS — C189 Malignant neoplasm of colon, unspecified: Secondary | ICD-10-CM

## 2015-11-25 LAB — COMPREHENSIVE METABOLIC PANEL
ALT: 8 U/L — ABNORMAL LOW (ref 14–54)
AST: 15 U/L (ref 15–41)
Albumin: 2.7 g/dL — ABNORMAL LOW (ref 3.5–5.0)
Alkaline Phosphatase: 96 U/L (ref 38–126)
Anion gap: 5 (ref 5–15)
BUN: 9 mg/dL (ref 6–20)
CO2: 23 mmol/L (ref 22–32)
Calcium: 8.7 mg/dL — ABNORMAL LOW (ref 8.9–10.3)
Chloride: 113 mmol/L — ABNORMAL HIGH (ref 101–111)
Creatinine, Ser: 0.82 mg/dL (ref 0.44–1.00)
GFR calc Af Amer: >60 mL/min (ref >60)
GFR calc non Af Amer: >60 mL/min (ref >60)
Glucose, Bld: 90 mg/dL (ref 65–99)
Potassium: 4.5 mmol/L (ref 3.5–5.1)
Sodium: 141 mmol/L (ref 135–145)
Total Bilirubin: 0.6 mg/dL (ref 0.3–1.2)
Total Protein: 5.8 g/dL — ABNORMAL LOW (ref 6.5–8.1)

## 2015-11-25 LAB — CBC WITH DIFFERENTIAL/PLATELET
Basophils Absolute: 0.1 10*3/uL (ref 0–0.1)
Basophils Relative: 1 %
Eosinophils Absolute: 0 10*3/uL (ref 0–0.7)
Eosinophils Relative: 0 %
HCT: 28.2 % — ABNORMAL LOW (ref 35.0–47.0)
Hemoglobin: 9.4 g/dL — ABNORMAL LOW (ref 12.0–16.0)
Lymphocytes Relative: 7 %
Lymphs Abs: 1.2 10*3/uL (ref 1.0–3.6)
MCH: 29.6 pg (ref 26.0–34.0)
MCHC: 33.3 g/dL (ref 32.0–36.0)
MCV: 88.9 fL (ref 80.0–100.0)
Monocytes Absolute: 0.8 10*3/uL (ref 0.2–0.9)
Monocytes Relative: 5 %
Neutro Abs: 13.6 10*3/uL — ABNORMAL HIGH (ref 1.4–6.5)
Neutrophils Relative %: 87 %
Platelets: 528 10*3/uL — ABNORMAL HIGH (ref 150–440)
RBC: 3.17 MIL/uL — ABNORMAL LOW (ref 3.80–5.20)
RDW: 18.8 % — ABNORMAL HIGH (ref 11.5–14.5)
WBC: 15.7 10*3/uL — ABNORMAL HIGH (ref 3.6–11.0)

## 2015-11-25 NOTE — Progress Notes (Signed)
Patient is here for a follow up. No complaints.

## 2015-11-25 NOTE — Patient Instructions (Signed)
Cetuximab injection What is this medicine? CETUXIMAB (se TUX i mab) is a monoclonal antibody. It is used to treat colorectal cancer and head and neck cancer. This medicine may be used for other purposes; ask your health care provider or pharmacist if you have questions. What should I tell my health care provider before I take this medicine? They need to know if you have any of these conditions: -heart disease -history of irregular heartbeat -history of low levels of calcium, magnesium, or potassium in the blood -lung or breathing disease, like asthma -an unusual or allergic reaction to cetuximab, other medicines, foods, dyes, or preservatives -pregnant or trying to get pregnant -breast-feeding How should I use this medicine? This drug is given as an infusion into a vein. It is administered in a hospital or clinic by a specially trained health care professional. Talk to your pediatrician regarding the use of this medicine in children. Special care may be needed. Overdosage: If you think you have taken too much of this medicine contact a poison control center or emergency room at once. NOTE: This medicine is only for you. Do not share this medicine with others. What if I miss a dose? It is important not to miss your dose. Call your doctor or health care professional if you are unable to keep an appointment. What may interact with this medicine? Interactions are not expected. This list may not describe all possible interactions. Give your health care provider a list of all the medicines, herbs, non-prescription drugs, or dietary supplements you use. Also tell them if you smoke, drink alcohol, or use illegal drugs. Some items may interact with your medicine. What should I watch for while using this medicine? Visit your doctor or health care professional for regular checks on your progress. This drug may make you feel generally unwell. This is not uncommon, as chemotherapy can affect healthy cells  as well as cancer cells. Report any side effects. Continue your course of treatment even though you feel ill unless your doctor tells you to stop. This medicine can make you more sensitive to the sun. Keep out of the sun while taking this medicine and for 2 months after the last dose. If you cannot avoid being in the sun, wear protective clothing and use sunscreen. Do not use sun lamps or tanning beds/booths. You may need blood work done while you are taking this medicine. In some cases, you may be given additional medicines to help with side effects. Follow all directions for their use. Call your doctor or health care professional for advice if you get a fever, chills or sore throat, or other symptoms of a cold or flu. Do not treat yourself. This drug decreases your body's ability to fight infections. Try to avoid being around people who are sick. Avoid taking products that contain aspirin, acetaminophen, ibuprofen, naproxen, or ketoprofen unless instructed by your doctor. These medicines may hide a fever. Do not become pregnant while taking this medicine. Women should inform their doctor if they wish to become pregnant or think they might be pregnant. There is a potential for serious side effects to an unborn child. Use adequate birth control methods. Avoid pregnancy for at least 6 months after your last dose. Talk to your health care professional or pharmacist for more information. Do not breast-feed an infant while taking this medicine or during the 2 months after your last dose. What side effects may I notice from receiving this medicine? Side effects that you should report to   your doctor or health care professional as soon as possible: -allergic reactions like skin rash, itching or hives, swelling of the face, lips, or tongue -breathing problems -changes in vision -fast, irregular heartbeat -feeling faint or lightheaded, falls -fever, chills -mouth sores -redness, blistering, peeling or  loosening of the skin, including inside the mouth -trouble passing urine or change in the amount of urine -unusually weak or tired Side effects that usually do not require medical attention (report to your doctor or health care professional if they continue or are bothersome): -changes in skin like acne, cracks, skin dryness -constipation -diarrhea -headache -nail changes -nausea, vomiting -stomach upset -weight loss This list may not describe all possible side effects. Call your doctor for medical advice about side effects. You may report side effects to FDA at 1-800-FDA-1088. Where should I keep my medicine? This drug is given in a hospital or clinic and will not be stored at home. NOTE: This sheet is a summary. It may not cover all possible information. If you have questions about this medicine, talk to your doctor, pharmacist, or health care provider.    2016, Elsevier/Gold Standard. (2014-06-06 22:27:08)  

## 2015-11-26 ENCOUNTER — Telehealth: Payer: Self-pay | Admitting: *Deleted

## 2015-11-26 ENCOUNTER — Inpatient Hospital Stay: Payer: Medicare Other

## 2015-11-26 NOTE — Telephone Encounter (Signed)
Called and spoke to daughter [name] about doing a special blood test to see if pt would be a good candidate for erbitux.  I explained to her that if the level is high then we would not give the drug but if it was low it meant a low probability that she would have a reaction from med.  [name] cut me off and she said that her and the pt's grandson has spoke about it and wither way too high risk for her ot have a new drug and do not want her tested and do not want that drug at this time.  They are willing when she gets back to feeling more of herself to get chemo she got before.  I told corcoran what she had said. She has another appt to f/u to eval again.

## 2015-12-02 ENCOUNTER — Other Ambulatory Visit: Payer: Self-pay | Admitting: *Deleted

## 2015-12-02 MED ORDER — OXYCODONE HCL 5 MG PO TABS: 5.0000 mg | ORAL_TABLET | Freq: Four times a day (QID) | ORAL | 0 refills | Status: DC | PRN

## 2015-12-09 ENCOUNTER — Inpatient Hospital Stay (HOSPITAL_BASED_OUTPATIENT_CLINIC_OR_DEPARTMENT_OTHER): Payer: Medicare Other | Admitting: Hematology and Oncology

## 2015-12-09 ENCOUNTER — Inpatient Hospital Stay: Payer: Medicare Other

## 2015-12-09 ENCOUNTER — Other Ambulatory Visit: Payer: Self-pay | Admitting: *Deleted

## 2015-12-09 ENCOUNTER — Encounter: Payer: Self-pay | Admitting: Hematology and Oncology

## 2015-12-09 ENCOUNTER — Telehealth: Payer: Self-pay | Admitting: *Deleted

## 2015-12-09 VITALS — BP 135/87 | HR 44 | Temp 97.4°F | Resp 18 | Wt 137.7 lb

## 2015-12-09 DIAGNOSIS — C189 Malignant neoplasm of colon, unspecified: Secondary | ICD-10-CM

## 2015-12-09 DIAGNOSIS — I4891 Unspecified atrial fibrillation: Secondary | ICD-10-CM

## 2015-12-09 DIAGNOSIS — R42 Dizziness and giddiness: Secondary | ICD-10-CM

## 2015-12-09 DIAGNOSIS — G629 Polyneuropathy, unspecified: Secondary | ICD-10-CM

## 2015-12-09 DIAGNOSIS — C787 Secondary malignant neoplasm of liver and intrahepatic bile duct: Principal | ICD-10-CM

## 2015-12-09 DIAGNOSIS — R001 Bradycardia, unspecified: Secondary | ICD-10-CM | POA: Diagnosis not present

## 2015-12-09 DIAGNOSIS — R918 Other nonspecific abnormal finding of lung field: Secondary | ICD-10-CM

## 2015-12-09 DIAGNOSIS — C187 Malignant neoplasm of sigmoid colon: Secondary | ICD-10-CM | POA: Diagnosis not present

## 2015-12-09 DIAGNOSIS — Z809 Family history of malignant neoplasm, unspecified: Secondary | ICD-10-CM

## 2015-12-09 DIAGNOSIS — Z7984 Long term (current) use of oral hypoglycemic drugs: Secondary | ICD-10-CM

## 2015-12-09 DIAGNOSIS — E041 Nontoxic single thyroid nodule: Secondary | ICD-10-CM

## 2015-12-09 DIAGNOSIS — R970 Elevated carcinoembryonic antigen [CEA]: Secondary | ICD-10-CM

## 2015-12-09 DIAGNOSIS — Z7982 Long term (current) use of aspirin: Secondary | ICD-10-CM

## 2015-12-09 DIAGNOSIS — Z79899 Other long term (current) drug therapy: Secondary | ICD-10-CM

## 2015-12-09 DIAGNOSIS — D709 Neutropenia, unspecified: Secondary | ICD-10-CM

## 2015-12-09 DIAGNOSIS — Z87891 Personal history of nicotine dependence: Secondary | ICD-10-CM

## 2015-12-09 DIAGNOSIS — Z8701 Personal history of pneumonia (recurrent): Secondary | ICD-10-CM

## 2015-12-09 DIAGNOSIS — Z9221 Personal history of antineoplastic chemotherapy: Secondary | ICD-10-CM

## 2015-12-09 DIAGNOSIS — K5900 Constipation, unspecified: Secondary | ICD-10-CM

## 2015-12-09 DIAGNOSIS — D571 Sickle-cell disease without crisis: Secondary | ICD-10-CM

## 2015-12-09 DIAGNOSIS — R634 Abnormal weight loss: Secondary | ICD-10-CM

## 2015-12-09 LAB — CBC WITH DIFFERENTIAL/PLATELET
Basophils Absolute: 0.2 10*3/uL — ABNORMAL HIGH (ref 0–0.1)
Basophils Relative: 1 %
Eosinophils Absolute: 0.7 10*3/uL (ref 0–0.7)
Eosinophils Relative: 6 %
HCT: 32 % — ABNORMAL LOW (ref 35.0–47.0)
Hemoglobin: 10.6 g/dL — ABNORMAL LOW (ref 12.0–16.0)
Lymphocytes Relative: 8 %
Lymphs Abs: 1 10*3/uL (ref 1.0–3.6)
MCH: 30 pg (ref 26.0–34.0)
MCHC: 33.3 g/dL (ref 32.0–36.0)
MCV: 90.1 fL (ref 80.0–100.0)
Monocytes Absolute: 0.6 10*3/uL (ref 0.2–0.9)
Monocytes Relative: 5 %
Neutro Abs: 10.5 10*3/uL — ABNORMAL HIGH (ref 1.4–6.5)
Neutrophils Relative %: 80 %
Platelets: 299 10*3/uL (ref 150–440)
RBC: 3.55 MIL/uL — ABNORMAL LOW (ref 3.80–5.20)
RDW: 21 % — ABNORMAL HIGH (ref 11.5–14.5)
WBC: 13.1 10*3/uL — ABNORMAL HIGH (ref 3.6–11.0)

## 2015-12-09 LAB — COMPREHENSIVE METABOLIC PANEL
ALT: 8 U/L — ABNORMAL LOW (ref 14–54)
AST: 17 U/L (ref 15–41)
Albumin: 3.2 g/dL — ABNORMAL LOW (ref 3.5–5.0)
Alkaline Phosphatase: 91 U/L (ref 38–126)
Anion gap: 9 (ref 5–15)
BUN: 14 mg/dL (ref 6–20)
CO2: 20 mmol/L — ABNORMAL LOW (ref 22–32)
Calcium: 9.5 mg/dL (ref 8.9–10.3)
Chloride: 107 mmol/L (ref 101–111)
Creatinine, Ser: 0.91 mg/dL (ref 0.44–1.00)
GFR calc Af Amer: >60 mL/min (ref >60)
GFR calc non Af Amer: >60 mL/min (ref >60)
Glucose, Bld: 136 mg/dL — ABNORMAL HIGH (ref 65–99)
Potassium: 4.4 mmol/L (ref 3.5–5.1)
Sodium: 136 mmol/L (ref 135–145)
Total Bilirubin: 0.5 mg/dL (ref 0.3–1.2)
Total Protein: 6.7 g/dL (ref 6.5–8.1)

## 2015-12-09 LAB — MAGNESIUM: Magnesium: 1.7 mg/dL (ref 1.7–2.4)

## 2015-12-09 NOTE — Progress Notes (Signed)
Patient is here for follow up, she did have a medication change. Her metoprolol was changed to 50mg  BID. Her pulse was 44 today. She has had low heart rates with the new medication and this change was made by the cardiologist.

## 2015-12-09 NOTE — Telephone Encounter (Signed)
Pt was in office today and cont. To have low heart rate so I had called Dr. Nehemiah Massed office and left mess. For them to call back and then dr. Nehemiah Massed nurse called back to say that pt was told to stop lisinopril and cut metoprolol down to 50 mg bid, and cont. Amiodarone 200 mg 2 tablets a day.  I told her that pt has been on this dose above since she left hosp. On 8/1.  I looked at the bottles myself while she was in office.. The nurse states that she will speak with md and call pt back this afternoon.  I called [name] and she got a call back from cardiology and they decreased her metoprolo to 25 mg and amiodarone 1 tablet a day and they sent refill to her pharmacy

## 2015-12-10 LAB — CEA: CEA: 532.5 ng/mL — ABNORMAL HIGH (ref 0.0–4.7)

## 2015-12-12 ENCOUNTER — Telehealth: Payer: Self-pay | Admitting: *Deleted

## 2015-12-12 ENCOUNTER — Encounter: Payer: Self-pay | Admitting: *Deleted

## 2015-12-12 NOTE — Telephone Encounter (Signed)
Got a call from her daughter [name] and she is trying to get her Mom medicaid and the social worker asked [name] if she could call and get a note that states that [name] is her primary caregiver.  I told her that I would speak to corcoran and if she says yes I will write letter and get in touch with her and the letter I just wrote and called daughter to let her know she can pick it up.

## 2015-12-15 NOTE — Progress Notes (Signed)
[facility] day:  12/09/2015  Chief Complaint: Sandy [name] is a 70 y.o. female with stage IV colon cancer who is seen for 2 week assessment and discussion regarding direction of therapy.  HPI: The patient was last seen in the medical oncology clinic on 08/14//2017.  At that time, he was seen for assessment after interval hospitalization for bacteremia.  She had made good improvement, but was still week. We discussed treatment options including 5-fluorouracil and leucovorin with cetuximab versus dose reduced FOLFIRI.  Tumor marker had decreased after 2 cycles of FOLFIRI.  Symptomatically, the patient feels "fair". He saw Dr. Nehemiah Massed last week.  Her lisinopril was discontinued She is taking metoprolol 50 mg twice a day instead of 200 mg.  She still feels tired with decreased energy.  The patient's daughter states that she completed physical therapy. She has some constipation (mild)  And a little bit of rib pain. Her daughter states that she "loves the commode".  The patient states that it's peaceful in the bathroom. Bowel movements are formed. She is eating well.  She always wants more to eat.   Past Medical History:  Diagnosis Date  . Cancer ([place])    liver mets; colon cancer  . Constipation   . Diabetes mellitus without complication ([place])   . HOH (hard of hearing)   . Hypertension   . Insomnia   . 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 Sickle cell anemia ([facility])   . Wears dentures    partial upper and lower    Past Surgical History:  Procedure Laterality Date  . CARDIAC CATHETERIZATION    . COLONOSCOPY WITH PROPOFOL N/A 09/07/2014   Procedure: COLONOSCOPY WITH PROPOFOL;  Surgeon: Lucilla Lame, MD;  Location: [facility] ENDOSCOPY;  Service: Endoscopy;  Laterality: N/A;  . ESOPHAGOGASTRODUODENOSCOPY N/A 09/07/2014   Procedure: ESOPHAGOGASTRODUODENOSCOPY (EGD);  Surgeon: Lucilla Lame, MD;  Location: [facility] ENDOSCOPY;  Service:  Endoscopy;  Laterality: N/A;  . FLEXIBLE SIGMOIDOSCOPY N/A 09/02/2015   Procedure: FLEXIBLE SIGMOIDOSCOPY;  Surgeon: Lucilla Lame, MD;  Location: [place];  Service: Endoscopy;  Laterality: N/A;  Diabetic - oral meds Pt has port-a-cath  . LIVER BIOPSY    . PORTACATH PLACEMENT Left 09/24/2014   Procedure: INSERTION PORT-A-CATH;  Surgeon: Robert Bellow, MD;  Location: [facility] ORS;  Service: General;  Laterality: Left;    Family History  Problem Relation Age of Onset  . Cancer Sister     Social History:  reports that she quit smoking about 2 years ago. Her smoking use included Cigarettes. She has a 5.00 pack-year smoking history. She has never used smokeless tobacco. She reports that she does not drink alcohol or use drugs.  The patient has recently been a resident of [facility].  The patient is accompanied by her daughter, [name].  The patient's medical power of attorney is her daughter.   Allergies:  Allergies  Allergen Reactions  . Tylenol [Acetaminophen] Nausea And Vomiting    Current Medications: Current Outpatient Prescriptions  Medication Sig Dispense Refill  . amiodarone (PACERONE) 400 MG tablet Take 1 tablet (400 mg total) by mouth daily. (Patient taking differently: Take 200 mg by mouth daily. ) 30 tablet 0  . aspirin EC 81 MG tablet Take 81 mg by mouth daily. Reported on 08/26/2015    . atorvastatin (LIPITOR) 40 MG tablet Take 40 mg by mouth daily.    . cefUROXime (CEFTIN) 500 MG tablet  Take 0.5 tablets (250 mg total) by mouth 2 (two) times daily with a meal. 20 tablet 0  . docusate sodium (COLACE) 100 MG capsule Take 1 capsule (100 mg total) by mouth 2 (two) times daily. 10 capsule 0  . feeding supplement, ENSURE ENLIVE, (ENSURE ENLIVE) LIQD Take 237 mLs by mouth 2 (two) times daily between meals. 237 mL 12  . gabapentin (NEURONTIN) 100 MG capsule Take 2 capsules (200 mg total) by mouth at bedtime. 1 tab daily and 2 tabs at bedtime. 60 capsule 0  .  lidocaine-prilocaine (EMLA) cream Apply 1 application topically as needed. Apply to port 1 hour prior to chemotherapy appointment. Cover with plastic wrap. 30 g 1  . lisinopril (PRINIVIL,ZESTRIL) 10 MG tablet Take 10 mg by mouth daily.    . magnesium 30 MG tablet Take 30 mg by mouth daily.     . megestrol (MEGACE) 400 MG/10ML suspension Take 5 mLs (200 mg total) by mouth daily. to stimulate appetite 240 mL 0  . metFORMIN (GLUCOPHAGE) 1000 MG tablet Take 1,000 mg by mouth 2 (two) times daily.     . metoprolol (LOPRESSOR) 50 MG tablet Take 1 tablet (50 mg total) by mouth 2 (two) times daily. 60 tablet 0  . Multiple Vitamin (MULTIVITAMIN WITH MINERALS) TABS tablet Take 1 tablet by mouth daily.    . ondansetron (ZOFRAN) 4 MG tablet Take 1 tablet (4 mg total) by mouth every 8 (eight) hours as needed for nausea or vomiting. 30 tablet 2  . oxyCODONE (OXY IR/ROXICODONE) 5 MG immediate release tablet Take 1 tablet (5 mg total) by mouth every 6 (six) hours as needed for severe pain. 30 tablet 0  . traZODone (DESYREL) 50 MG tablet Take 0.5 tablets (25 mg total) by mouth at bedtime as needed for sleep. 20 tablet 0   No current facility-administered medications for this visit.    Facility-Administered Medications Ordered in Other Visits  Medication Dose Route Frequency Provider Last Rate Last Dose  . acetaminophen (TYLENOL) tablet 650 mg  650 mg Oral Once Lequita Asal, MD      . sodium chloride 0.9 % injection 10 mL  10 mL Intracatheter PRN Lequita Asal, MD   10 mL at 10/11/14 1431  . sodium chloride 0.9 % injection 10 mL  10 mL Intracatheter PRN Lequita Asal, MD   10 mL at 02/28/15 1529    Review of Systems:  GENERAL: Feels better.  No fevers.  Weight down 10 pounds (fluctuates; up 4 pounds from 1 month ago). PERFORMANCE STATUS (ECOG): 2. HEENT: Wears a hearing aide. No visual changes, runny nose, sore throat, mouth sores or tenderness. Lungs:  No shortness of breath or cough. No  hemoptysis. Cardiac: Heart rate slow.  No chest pain, palpitations, orthopnea, or PND. GI: Eating good.  Some constipation.  No nausea, vomiting, diarrhea, melena or hematochezia. GU: No dysuria or hematuria. Musculoskeletal: Denies back pain. No joint pain. No muscle tenderness. Extremities: No pain or swelling. Skin: No rashes, skin lesions, or ulcers Neuro: No headache, numbness or weakness, or balance issues. Endocrine: Diabetes. No thyroid issues, hot flashes or night sweats. Psych: No mood changes or anxiety. Pain: Takes oxycodone in the AM. Review of systems: All other systems   Physical Exam: Blood pressure 135/87, pulse (!) 44, temperature 97.4 F (36.3 C), temperature source Tympanic, resp. rate 18, weight 137 lb 10.8 oz (62.5 kg). GENERAL: Elderly woman sitting comfortably in a wheelchair in no acute distress. MENTAL STATUS: Alert  and oriented to person, place and time. HEAD: Wearing a black and white cap.  Alopecia totalis. Normocephalic, atraumatic, face symmetric, no Cushingoid features. EYES: Black rimmed glasses. Hazel/green eyes. Pupils equal round and reactive to light and accomodation. No conjunctivitis or scleral icterus. ENT: Oropharynx clear without lesion. Missing several teeth. Tongue normal. Mucous membranes moist.  RESPIRATORY: Clear to auscultation without rales, wheezes or rhonchi. CARDIOVASCULAR: Bradycardia.  Regular rate and rhythm without murmur, rub or gallop. ABDOMEN: Soft, non-tender with active bowel sounds. No guarding or rebound tenderness. No hepatosplenomegaly. SKIN: No rashes, ulcer or skin lesions.  EXTREMITIES: No edema, no skin discoloration or tenderness. No palpable cords. LYMPH NODES: Right low anterior cervical 2-3 cm soft mass (thyroid). No palpable supraclavicular, axillary or inguinal adenopathy  NEUROLOGICAL: Appropriate. PSYCH: Affect normal.  Appointment on 12/09/2015  Component Date Value Ref Range  Status  . Magnesium 12/09/2015 1.7  1.7 - 2.4 mg/dL Final  . WBC 12/09/2015 13.1* 3.6 - 11.0 K/uL Final  . RBC 12/09/2015 3.55* 3.80 - 5.20 MIL/uL Final  . Hemoglobin 12/09/2015 10.6* 12.0 - 16.0 g/dL Final  . HCT 12/09/2015 32.0* 35.0 - 47.0 % Final  . MCV 12/09/2015 90.1  80.0 - 100.0 fL Final  . MCH 12/09/2015 30.0  26.0 - 34.0 pg Final  . MCHC 12/09/2015 33.3  32.0 - 36.0 g/dL Final  . RDW 12/09/2015 21.0* 11.5 - 14.5 % Final  . Platelets 12/09/2015 299  150 - 440 K/uL Final  . Neutrophils Relative % 12/09/2015 80  % Final  . Neutro Abs 12/09/2015 10.5* 1.4 - 6.5 K/uL Final  . Lymphocytes Relative 12/09/2015 8  % Final  . Lymphs Abs 12/09/2015 1.0  1.0 - 3.6 K/uL Final  . Monocytes Relative 12/09/2015 5  % Final  . Monocytes Absolute 12/09/2015 0.6  0.2 - 0.9 K/uL Final  . Eosinophils Relative 12/09/2015 6  % Final  . Eosinophils Absolute 12/09/2015 0.7  0 - 0.7 K/uL Final  . Basophils Relative 12/09/2015 1  % Final  . Basophils Absolute 12/09/2015 0.2* 0 - 0.1 K/uL Final  . Sodium 12/09/2015 136  135 - 145 mmol/L Final  . Potassium 12/09/2015 4.4  3.5 - 5.1 mmol/L Final  . Chloride 12/09/2015 107  101 - 111 mmol/L Final  . CO2 12/09/2015 20* 22 - 32 mmol/L Final  . Glucose, Bld 12/09/2015 136* 65 - 99 mg/dL Final  . BUN 12/09/2015 14  6 - 20 mg/dL Final  . Creatinine, Ser 12/09/2015 0.91  0.44 - 1.00 mg/dL Final  . Calcium 12/09/2015 9.5  8.9 - 10.3 mg/dL Final  . Total Protein 12/09/2015 6.7  6.5 - 8.1 g/dL Final  . Albumin 12/09/2015 3.2* 3.5 - 5.0 g/dL Final  . AST 12/09/2015 17  15 - 41 U/L Final  . ALT 12/09/2015 8* 14 - 54 U/L Final  . Alkaline Phosphatase 12/09/2015 91  38 - 126 U/L Final  . Total Bilirubin 12/09/2015 0.5  0.3 - 1.2 mg/dL Final  . GFR calc non Af Amer 12/09/2015 >60  >60 mL/min Final  . GFR calc Af Amer 12/09/2015 >60  >60 mL/min Final   Comment: (NOTE) The eGFR has been calculated using the CKD EPI equation. This calculation has not been validated  in all clinical situations. eGFR's persistently <60 mL/min signify possible Chronic Kidney Disease.   . Anion gap 12/09/2015 9  5 - 15 Final  . CEA 12/10/2015 532.5* 0.0 - 4.7 ng/mL Final   Comment: (NOTE)  Roche ECLIA methodology       Nonsmokers  <3.9                                     Smokers     <5.6 Performed At: [facility] [place], [place] [id number] Lindon Romp MD WU:[id number]     Assessment:  KIAHNA BANGHART is a 70 y.o. female with metastatic colon cancer. She presented with a 53 pound weight loss in 6 months. Colonoscopy on 09/07/2014 revealed a circumferential partially obstructive fungating mass in the descending colon. There was no active bleeding. There was a 1.5 cm pedunculated polyp in the sigmoid colon (tubulovillous adenoma). Pathology from the descending colon mass revealed fragments of adenocarcinoma. There was not enough tissue for KRAS testing.  CEA was 11,793 on 09/05/2014.  KRAS and NRAS mutational analysis on 09/02/2015 was incomplete.  KRAS results from analysis of codons 59, 61, and 117 were negative.  Results were not obtained for codons 12, 13, and 146.  NRAS gene results from analysis of codons 12 and 13 were negative.  Results were not obtained for codons 59, 69, 61, 117, and 146.  Chest CT angiogram on 08/28/2014 revealed no evidence of pulmonary embolism, but multiple large enhancing liver masses (largest 6.4 cm).  PET scan on 10/04/2014 revealed 6.5 x 6.1 cm hypermetabolic mass in the proximal sigmoid colon,  There were  numerous large hypermetabolic hepatic metastases. There were no additional sites of metastatic disease in the neck, chest or skeleton.  In addition, there was a 3.2 x 2.8 cm low-attenuation right thyroid nodule.   She is status post 14 cycles of FOLFOX chemotherapy (10/09/2014 - 04/11/2015).  She declined Avastin.  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    CEA was 10,169 on  10/09/2014 , 12,034 on 0712/2016, 7765 on 11/20/2014, 5154 on 12/04/2014, 1921 on 01/01/2015, 1266 on 01/15/2015, 770.6 on 01/29/2015, 502.7 on 02/12/2015, 368.7 on 02/26/2015, 269 on 03/14/2015, 178.3 on 03/28/2015, 135.9 on 04/11/2015, 116.1 on 05/13/2015, 90.8 on 06/10/2015, 145.9 on 07/08/2015, 300.7 on 08/05/2015, 296.3 on 08/19/2015, and 443.0 on 09/24/2015, 1341 on 10/08/2015, and 523 on 11/12/2015.  Abdominal and pelvic CT scan on 12/28/2014 revealed response to therapy.  Liver metastases and the descending/sigmoid mass were smaller.  There was no new disease.  Abdomen and pelvic CT scan on 03/25/2015 revealed interval decrease in size of hepatic metastasis and distal descending colon mass.   Abdominal and pelvic CT scan on 03/25/2015 revealed decreasing hepatic metastasis and distal descending colon mass.  Right hepatic lobe lesion was 2.2 x 2.1 cm.  Left lateral hepatic lobe lesion was 1.9 x 1.5 cm.  There was stable thickening of the adrenal glands.  She is status post 8 cycles of 5-fluorouracil and leucovorin (04/28/2014 - 09/24/2015).  Cycle #4 was held on 06/10/2015 secondary to rectal bleeding.  Abdominal and pelvic MRI on 08/01/2015 revealed multiple hypoenhancing hepatic metastasis in both lobes of the liver (3.3 x 2.2 cm in segment VII, 5.9 x 4.1 cm in segment VIII, and a 2.4 x 1.7 cm caudate lesion).  There was a 4.3 cm length of focally narrowed sigmoid colon.  There was nodular thickening of the right adrenal gland.  PET scan on 08/21/2015 revealed interval improvement in the multifocal hepatic metastatic disease. There was residual hypermetabolic tumor.  The primary colonic lesion at the junction of the  descending and sigmoid colon had decreased in size and metabolic activity.  There was persistent hypermetabolic activity in the lower right neck, possibly associated with the thyroid gland.   Abdominal and pelvic CT scan on 10/04/2015 revealed increased bulkiness of soft tissue mass  in distal descending colon compared with previous studies, consistent with primary colon carcinoma.  The mass measured 4.7 cm compared to 3.3 cm.  The liver metastasis were felt larger by second radiology review.  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  Afirma gene expression classifier GEC) was suspicious.  Gene expression signature for medullary thyroid cancer (MTC) and BRAF V600E were negative.  She is s/p 2 cycles of FOLFIRI chemotherapy (10/08/2015 - 10/29/2015).  Cycle #1 complicated by an admission for fever and neutropenia on day 11 (10/18/2015).  All cultures were negative.  Cycle #2 was complicated by fever and neutropenia (ANC 0) despite Neulasta.  She was diagnosed with Klebsiella pneumonia bacteremia.  She has history of atrial fibrillation with RVR.  She is on metoprolol and amiodarone.  She has symptomatic bradycardia.   Symptomatically, she is fatigued.  Exam is unremarkable except for bradycadia.  Plan: 1.  Labs today:  CBC with diff, CMP, CEA. 2.  Discuss patient's thoughts about treatment.  Patient is not interested in cetuximab.  She is concerned about potential side effects.  She is interested in dose reduction of FOLFIRI.  Discuss plan to initiate therapy in the next couple of weeks. 3.  Discuss potential other options in the future: Stivarga (regorafenib) or Lonsurf (trifluridine + tipiracil). 4.  Contact cardiology re: bradycardia (pulse 44-47) for medication adjustment. 5.  RTC in 2 weeks for MD assessment and labs (CBC with diff, CMP, CEA) and FOLFIRI.   Lequita Asal, MD  12/09/2015

## 2015-12-19 ENCOUNTER — Telehealth: Payer: Self-pay | Admitting: *Deleted

## 2015-12-19 ENCOUNTER — Other Ambulatory Visit: Payer: Self-pay | Admitting: *Deleted

## 2015-12-19 MED ORDER — ONDANSETRON HCL 4 MG PO TABS: 4.0000 mg | ORAL_TABLET | Freq: Three times a day (TID) | ORAL | 2 refills | Status: DC | PRN

## 2015-12-19 MED ORDER — ONDANSETRON HCL 4 MG PO TABS
4.0000 mg | ORAL_TABLET | Freq: Three times a day (TID) | ORAL | 2 refills | Status: AC | PRN
Start: 2015-12-19 — End: ?

## 2015-12-19 MED ORDER — OXYCODONE HCL 5 MG PO TABS: 5.0000 mg | ORAL_TABLET | Freq: Four times a day (QID) | ORAL | 0 refills | Status: DC | PRN

## 2015-12-19 NOTE — Addendum Note (Signed)
Addended by: Betti Cruz on: 12/19/2015 04:22 PM   Modules accepted: Orders

## 2015-12-19 NOTE — Telephone Encounter (Signed)
Requesting more than 30 tabs be dispensed at a time. OK per Dr Mike Gip if insurance will cover it

## 2015-12-22 NOTE — Progress Notes (Signed)
[facility] day:  12/23/2015   Chief Complaint: Sandy [name] is a 70 y.o. female with stage IV colon cancer who is seen for 2 week assessment and assessment prior to cycle #3 FOLFIRI.  HPI: The patient was last seen in the medical oncology clinic on 12/09/2015.  At that time, we discussed direction of therapy.  Symptomatically, she was fatigued.  Exam was unremarkable except for bradycadia.  Her medications were adjusted by cardiology.  She was not interested in cetuximab.  She was concerned about potential side effects.  She was interested in dose reduction of FOLFIRI.    During the interim, she has felt fair.  She denies any aches or pains.  She is spending more time out of the bathroom.  She has been drinking Boost.  She saw her cardiologist on Friday, 12/20/2015.  She is off metoprolol.   Past Medical History:  Diagnosis Date  . Cancer ([place])    liver mets; colon cancer  . Constipation   . Diabetes mellitus without complication ([place])   . HOH (hard of hearing)   . Hypertension   . Insomnia   . 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 Sickle cell anemia ([facility])   . Wears dentures    partial upper and lower    Past Surgical History:  Procedure Laterality Date  . CARDIAC CATHETERIZATION    . COLONOSCOPY WITH PROPOFOL N/A 09/07/2014   Procedure: COLONOSCOPY WITH PROPOFOL;  Surgeon: Lucilla Lame, MD;  Location: [facility] ENDOSCOPY;  Service: Endoscopy;  Laterality: N/A;  . ESOPHAGOGASTRODUODENOSCOPY N/A 09/07/2014   Procedure: ESOPHAGOGASTRODUODENOSCOPY (EGD);  Surgeon: Lucilla Lame, MD;  Location: [facility] ENDOSCOPY;  Service: Endoscopy;  Laterality: N/A;  . FLEXIBLE SIGMOIDOSCOPY N/A 09/02/2015   Procedure: FLEXIBLE SIGMOIDOSCOPY;  Surgeon: Lucilla Lame, MD;  Location: [place];  Service: Endoscopy;  Laterality: N/A;  Diabetic - oral meds Pt has port-a-cath  . LIVER BIOPSY    . PORTACATH PLACEMENT Left 09/24/2014   Procedure: INSERTION PORT-A-CATH;  Surgeon: Robert Bellow, MD;  Location: [facility] ORS;  Service: General;  Laterality: Left;    Family History  Problem Relation Age of Onset  . Cancer Sister     Social History:  reports that she quit smoking about 2 years ago. Her smoking use included Cigarettes. She has a 5.00 pack-year smoking history. She has never used smokeless tobacco. She reports that she does not drink alcohol or use drugs.  The patient has recently been a resident of [facility].  The patient is accompanied by her daughter, [name].  The patient's medical power of attorney is her daughter.   Allergies:  Allergies  Allergen Reactions  . Tylenol [Acetaminophen] Nausea And Vomiting    Current Medications: Current Outpatient Prescriptions  Medication Sig Dispense Refill  . amiodarone (PACERONE) 400 MG tablet Take 1 tablet (400 mg total) by mouth daily. (Patient taking differently: Take 200 mg by mouth daily. ) 30 tablet 0  . aspirin EC 81 MG tablet Take 81 mg by mouth daily. Reported on 08/26/2015    . atorvastatin (LIPITOR) 40 MG tablet Take 40 mg by mouth daily.    . cefUROXime (CEFTIN) 500 MG tablet Take 0.5 tablets (250 mg total) by mouth 2 (two) times daily with a meal. 20 tablet 0  . docusate sodium (COLACE) 100 MG capsule Take 1 capsule (100 mg total) by mouth 2 (two) times daily. 10 capsule 0  .  feeding supplement, ENSURE ENLIVE, (ENSURE ENLIVE) LIQD Take 237 mLs by mouth 2 (two) times daily between meals. 237 mL 12  . gabapentin (NEURONTIN) 100 MG capsule Take 2 capsules (200 mg total) by mouth at bedtime. 1 tab daily and 2 tabs at bedtime. 60 capsule 0  . lidocaine-prilocaine (EMLA) cream Apply 1 application topically as needed. Apply to port 1 hour prior to chemotherapy appointment. Cover with plastic wrap. 30 g 1  . lisinopril (PRINIVIL,ZESTRIL) 10 MG tablet Take 10 mg by mouth daily.    . magnesium 30 MG tablet Take 30 mg by mouth daily.     . megestrol (MEGACE) 400  MG/10ML suspension Take 5 mLs (200 mg total) by mouth daily. to stimulate appetite 240 mL 0  . metFORMIN (GLUCOPHAGE) 1000 MG tablet Take 1,000 mg by mouth 2 (two) times daily.     . Multiple Vitamin (MULTIVITAMIN WITH MINERALS) TABS tablet Take 1 tablet by mouth daily.    . ondansetron (ZOFRAN) 4 MG tablet Take 1 tablet (4 mg total) by mouth every 8 (eight) hours as needed for nausea or vomiting. 90 tablet 2  . oxyCODONE (OXY IR/ROXICODONE) 5 MG immediate release tablet Take 1 tablet (5 mg total) by mouth every 6 (six) hours as needed for severe pain. 30 tablet 0  . traZODone (DESYREL) 50 MG tablet Take 0.5 tablets (25 mg total) by mouth at bedtime as needed for sleep. 20 tablet 0   No current facility-administered medications for this visit.    Facility-Administered Medications Ordered in Other Visits  Medication Dose Route Frequency Provider Last Rate Last Dose  . acetaminophen (TYLENOL) tablet 650 mg  650 mg Oral Once Lequita Asal, MD      . sodium chloride 0.9 % injection 10 mL  10 mL Intracatheter PRN Lequita Asal, MD   10 mL at 10/11/14 1431  . sodium chloride 0.9 % injection 10 mL  10 mL Intracatheter PRN Lequita Asal, MD   10 mL at 02/28/15 1529    Review of Systems:  GENERAL: Feels good.  No fevers.  Weight down 5 pounds. PERFORMANCE STATUS (ECOG): 2. HEENT: Wears a hearing aide. No visual changes, runny nose, sore throat, mouth sores or tenderness. Lungs:  No shortness of breath or cough. No hemoptysis. Cardiac: Heart rate slow.  No chest pain, palpitations, orthopnea, or PND. GI: Eating good.  Loose stools on Boost.  No nausea, vomiting, diarrhea, melena or hematochezia. GU: No dysuria or hematuria. Musculoskeletal: Denies back pain. No joint pain. No muscle tenderness. Extremities: No pain or swelling. Skin: No rashes, skin lesions, or ulcers Neuro: No headache, numbness or weakness, or balance issues. Endocrine: Diabetes. No thyroid issues,  hot flashes or night sweats. Psych: No mood changes or anxiety. Pain: Takes oxycodone in the AM. Review of systems: All other systems   Physical Exam: Blood pressure 129/86, pulse 80, temperature (!) 95.4 F (35.2 C), temperature source Tympanic, resp. rate 18, weight 142 lb 9 oz (64.7 kg). GENERAL: Elderly woman sitting comfortably in a wheelchair in no acute distress. MENTAL STATUS: Alert and oriented to person, place and time. HEAD: Wearing a black wrap.  Alopecia totalis. Normocephalic, atraumatic, face symmetric, no Cushingoid features. EYES: Black rimmed glasses. Hazel/green eyes. Pupils equal round and reactive to light and accomodation. No conjunctivitis or scleral icterus. ENT: Oropharynx clear without lesion. Missing several teeth. Tongue normal. Mucous membranes moist.  RESPIRATORY: Clear to auscultation without rales, wheezes or rhonchi. CARDIOVASCULAR: Regular rate and rhythm without  murmur, rub or gallop. ABDOMEN: Soft, non-tender with active bowel sounds. No guarding or rebound tenderness. No hepatosplenomegaly. SKIN: No rashes, ulcer or skin lesions.  EXTREMITIES: No edema, no skin discoloration or tenderness. No palpable cords. LYMPH NODES: Right low anterior cervical 3 cm soft mass (thyroid). No palpable supraclavicular, axillary or inguinal adenopathy  NEUROLOGICAL: Appropriate. PSYCH: Affect normal.  Appointment on 12/23/2015  Component Date Value Ref Range Status  . WBC 12/23/2015 13.0* 3.6 - 11.0 K/uL Final  . RBC 12/23/2015 3.44* 3.80 - 5.20 MIL/uL Final  . Hemoglobin 12/23/2015 10.4* 12.0 - 16.0 g/dL Final  . HCT 12/23/2015 31.4* 35.0 - 47.0 % Final  . MCV 12/23/2015 91.4  80.0 - 100.0 fL Final  . MCH 12/23/2015 30.2  26.0 - 34.0 pg Final  . MCHC 12/23/2015 33.0  32.0 - 36.0 g/dL Final  . RDW 12/23/2015 19.3* 11.5 - 14.5 % Final  . Platelets 12/23/2015 317  150 - 440 K/uL Final  . Neutrophils Relative % 12/23/2015 79  % Final  . Neutro  Abs 12/23/2015 10.2* 1.4 - 6.5 K/uL Final  . Lymphocytes Relative 12/23/2015 10  % Final  . Lymphs Abs 12/23/2015 1.3  1.0 - 3.6 K/uL Final  . Monocytes Relative 12/23/2015 6  % Final  . Monocytes Absolute 12/23/2015 0.8  0.2 - 0.9 K/uL Final  . Eosinophils Relative 12/23/2015 4  % Final  . Eosinophils Absolute 12/23/2015 0.5  0 - 0.7 K/uL Final  . Basophils Relative 12/23/2015 1  % Final  . Basophils Absolute 12/23/2015 0.1  0 - 0.1 K/uL Final  . Sodium 12/23/2015 138  135 - 145 mmol/L Final  . Potassium 12/23/2015 4.1  3.5 - 5.1 mmol/L Final  . Chloride 12/23/2015 109  101 - 111 mmol/L Final  . CO2 12/23/2015 21* 22 - 32 mmol/L Final  . Glucose, Bld 12/23/2015 105* 65 - 99 mg/dL Final  . BUN 12/23/2015 14  6 - 20 mg/dL Final  . Creatinine, Ser 12/23/2015 0.90  0.44 - 1.00 mg/dL Final  . Calcium 12/23/2015 9.0  8.9 - 10.3 mg/dL Final  . Total Protein 12/23/2015 6.2* 6.5 - 8.1 g/dL Final  . Albumin 12/23/2015 3.0* 3.5 - 5.0 g/dL Final  . AST 12/23/2015 21  15 - 41 U/L Final  . ALT 12/23/2015 10* 14 - 54 U/L Final  . Alkaline Phosphatase 12/23/2015 94  38 - 126 U/L Final  . Total Bilirubin 12/23/2015 0.4  0.3 - 1.2 mg/dL Final  . GFR calc non Af Amer 12/23/2015 >60  >60 mL/min Final  . GFR calc Af Amer 12/23/2015 >60  >60 mL/min Final   Comment: (NOTE) The eGFR has been calculated using the CKD EPI equation. This calculation has not been validated in all clinical situations. eGFR's persistently <60 mL/min signify possible Chronic Kidney Disease.   Georgiann Hahn gap 12/23/2015 8  5 - 15 Final    Assessment:  Sandy [name] is a 70 y.o. female with metastatic colon cancer. She presented with a 53 pound weight loss in 6 months. Colonoscopy on 09/07/2014 revealed a circumferential partially obstructive fungating mass in the descending colon. There was no active bleeding. There was a 1.5 cm pedunculated polyp in the sigmoid colon (tubulovillous adenoma). Pathology from the descending  colon mass revealed fragments of adenocarcinoma. There was not enough tissue for KRAS testing.  CEA was 11,793 on 09/05/2014.  KRAS and NRAS mutational analysis on 09/02/2015 was incomplete.  KRAS results from analysis of codons 59, 61,  and 117 were negative.  Results were not obtained for codons 12, 13, and 146.  NRAS gene results from analysis of codons 12 and 13 were negative.  Results were not obtained for codons 59, 69, 61, 117, and 146.  Chest CT angiogram on 08/28/2014 revealed no evidence of pulmonary embolism, but multiple large enhancing liver masses (largest 6.4 cm).  PET scan on 10/04/2014 revealed 6.5 x 6.1 cm hypermetabolic mass in the proximal sigmoid colon,  There were  numerous large hypermetabolic hepatic metastases. There were no additional sites of metastatic disease in the neck, chest or skeleton.  In addition, there was a 3.2 x 2.8 cm low-attenuation right thyroid nodule.   She is status post 14 cycles of FOLFOX chemotherapy (10/09/2014 - 04/11/2015).  She declined Avastin.  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    CEA was 10,169 on 10/09/2014 , 12,034 on 0712/2016, 7765 on 11/20/2014, 5154 on 12/04/2014, 1921 on 01/01/2015, 1266 on 01/15/2015, 770.6 on 01/29/2015, 502.7 on 02/12/2015, 368.7 on 02/26/2015, 269 on 03/14/2015, 178.3 on 03/28/2015, 135.9 on 04/11/2015, 116.1 on 05/13/2015, 90.8 on 06/10/2015, 145.9 on 07/08/2015, 300.7 on 08/05/2015, 296.3 on 08/19/2015, and 443.0 on 09/24/2015, 1341 on 10/08/2015, 523 on 11/12/2015, and 532.5 on 12/09/2015.  Abdominal and pelvic CT scan on 12/28/2014 revealed response to therapy.  Liver metastases and the descending/sigmoid mass were smaller.  There was no new disease.  Abdomen and pelvic CT scan on 03/25/2015 revealed interval decrease in size of hepatic metastasis and distal descending colon mass.   Abdominal and pelvic CT scan on 03/25/2015 revealed decreasing hepatic metastasis and distal descending colon  mass.  Right hepatic lobe lesion was 2.2 x 2.1 cm.  Left lateral hepatic lobe lesion was 1.9 x 1.5 cm.  There was stable thickening of the adrenal glands.  She received 8 cycles of 5-fluorouracil and leucovorin (04/28/2014 - 09/24/2015).  Cycle #4 was held on 06/10/2015 secondary to rectal bleeding.  Abdominal and pelvic MRI on 08/01/2015 revealed multiple hypoenhancing hepatic metastasis in both lobes of the liver (3.3 x 2.2 cm in segment VII, 5.9 x 4.1 cm in segment VIII, and a 2.4 x 1.7 cm caudate lesion).  There was a 4.3 cm length of focally narrowed sigmoid colon.  There was nodular thickening of the right adrenal gland.  PET scan on 08/21/2015 revealed interval improvement in the multifocal hepatic metastatic disease. There was residual hypermetabolic tumor.  The primary colonic lesion at the junction of the descending and sigmoid colon had decreased in size and metabolic activity.  There was persistent hypermetabolic activity in the lower right neck, possibly associated with the thyroid gland.   Abdominal and pelvic CT scan on 10/04/2015 revealed increased bulkiness of soft tissue mass in distal descending colon compared with previous studies, consistent with primary colon carcinoma.  The mass measured 4.7 cm compared to 3.3 cm.  The liver metastasis were felt larger by second radiology review.  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  Afirma gene expression classifier GEC) was suspicious.  Gene expression signature for medullary thyroid cancer (MTC) and BRAF [id number] were negative.  Patient wishes to defer any treatment.  She is s/p 2 cycles of FOLFIRI chemotherapy (10/08/2015 - 10/29/2015).  Cycle #1 complicated by an admission for fever and neutropenia on day 11 (10/18/2015).  All cultures were negative.  Cycle #2 was complicated by fever and neutropenia (ANC 0) despite Neulasta.  She was diagnosed with  Klebsiella pneumonia bacteremia.  She has history of atrial fibrillation with RVR.  She is on amiodarone.   Symptomatically, she is near baseline.  Exam is unremarkable.  Plan: 1.  Labs today:  CBC with diff, CMP, CEA. 2.  Cycle #3 FOLFIRI with dose level -2 reduction.  Discussed with pharmacy. 3.  RTC in 2 days for disconnect and Neulasta. 4.  RTC in 1 week for CBC with diff 5.  RTC in 2 weeks for MD assessment, labs (CBC with diff, CMP, Mg, CEA) and FOLFIRI.   Lequita Asal, MD  12/23/2015, 11:03 AM

## 2015-12-23 ENCOUNTER — Other Ambulatory Visit: Payer: Self-pay | Admitting: Hematology and Oncology

## 2015-12-23 ENCOUNTER — Encounter: Payer: Self-pay | Admitting: Hematology and Oncology

## 2015-12-23 ENCOUNTER — Inpatient Hospital Stay: Payer: Medicare Other

## 2015-12-23 ENCOUNTER — Inpatient Hospital Stay: Payer: Medicare Other | Attending: Hematology and Oncology | Admitting: Hematology and Oncology

## 2015-12-23 VITALS — BP 148/92 | HR 77 | Temp 96.0°F | Resp 18

## 2015-12-23 VITALS — BP 129/86 | HR 80 | Temp 95.4°F | Resp 18 | Wt 142.6 lb

## 2015-12-23 DIAGNOSIS — Z5111 Encounter for antineoplastic chemotherapy: Secondary | ICD-10-CM | POA: Insufficient documentation

## 2015-12-23 DIAGNOSIS — I10 Essential (primary) hypertension: Secondary | ICD-10-CM | POA: Diagnosis not present

## 2015-12-23 DIAGNOSIS — D571 Sickle-cell disease without crisis: Secondary | ICD-10-CM

## 2015-12-23 DIAGNOSIS — Z7982 Long term (current) use of aspirin: Secondary | ICD-10-CM | POA: Diagnosis not present

## 2015-12-23 DIAGNOSIS — K5900 Constipation, unspecified: Secondary | ICD-10-CM

## 2015-12-23 DIAGNOSIS — Z79899 Other long term (current) drug therapy: Secondary | ICD-10-CM

## 2015-12-23 DIAGNOSIS — I4891 Unspecified atrial fibrillation: Secondary | ICD-10-CM | POA: Diagnosis not present

## 2015-12-23 DIAGNOSIS — E041 Nontoxic single thyroid nodule: Secondary | ICD-10-CM

## 2015-12-23 DIAGNOSIS — G4700 Insomnia, unspecified: Secondary | ICD-10-CM | POA: Diagnosis not present

## 2015-12-23 DIAGNOSIS — C187 Malignant neoplasm of sigmoid colon: Secondary | ICD-10-CM

## 2015-12-23 DIAGNOSIS — C787 Secondary malignant neoplasm of liver and intrahepatic bile duct: Principal | ICD-10-CM

## 2015-12-23 DIAGNOSIS — Z7984 Long term (current) use of oral hypoglycemic drugs: Secondary | ICD-10-CM | POA: Diagnosis not present

## 2015-12-23 DIAGNOSIS — C189 Malignant neoplasm of colon, unspecified: Secondary | ICD-10-CM

## 2015-12-23 DIAGNOSIS — Z8781 Personal history of (healed) traumatic fracture: Secondary | ICD-10-CM | POA: Diagnosis not present

## 2015-12-23 DIAGNOSIS — G629 Polyneuropathy, unspecified: Secondary | ICD-10-CM | POA: Diagnosis not present

## 2015-12-23 DIAGNOSIS — Z87891 Personal history of nicotine dependence: Secondary | ICD-10-CM | POA: Diagnosis not present

## 2015-12-23 DIAGNOSIS — E119 Type 2 diabetes mellitus without complications: Secondary | ICD-10-CM | POA: Diagnosis not present

## 2015-12-23 DIAGNOSIS — Z7689 Persons encountering health services in other specified circumstances: Secondary | ICD-10-CM | POA: Diagnosis not present

## 2015-12-23 DIAGNOSIS — T451X5S Adverse effect of antineoplastic and immunosuppressive drugs, sequela: Secondary | ICD-10-CM | POA: Diagnosis not present

## 2015-12-23 DIAGNOSIS — R5383 Other fatigue: Secondary | ICD-10-CM | POA: Diagnosis not present

## 2015-12-23 LAB — COMPREHENSIVE METABOLIC PANEL
ALT: 10 U/L — ABNORMAL LOW (ref 14–54)
AST: 21 U/L (ref 15–41)
Albumin: 3 g/dL — ABNORMAL LOW (ref 3.5–5.0)
Alkaline Phosphatase: 94 U/L (ref 38–126)
Anion gap: 8 (ref 5–15)
BUN: 14 mg/dL (ref 6–20)
CO2: 21 mmol/L — ABNORMAL LOW (ref 22–32)
Calcium: 9 mg/dL (ref 8.9–10.3)
Chloride: 109 mmol/L (ref 101–111)
Creatinine, Ser: 0.9 mg/dL (ref 0.44–1.00)
GFR calc Af Amer: >60 mL/min (ref >60)
GFR calc non Af Amer: >60 mL/min (ref >60)
Glucose, Bld: 105 mg/dL — ABNORMAL HIGH (ref 65–99)
Potassium: 4.1 mmol/L (ref 3.5–5.1)
Sodium: 138 mmol/L (ref 135–145)
Total Bilirubin: 0.4 mg/dL (ref 0.3–1.2)
Total Protein: 6.2 g/dL — ABNORMAL LOW (ref 6.5–8.1)

## 2015-12-23 LAB — CBC WITH DIFFERENTIAL/PLATELET
Basophils Absolute: 0.1 10*3/uL (ref 0–0.1)
Basophils Relative: 1 %
Eosinophils Absolute: 0.5 10*3/uL (ref 0–0.7)
Eosinophils Relative: 4 %
HCT: 31.4 % — ABNORMAL LOW (ref 35.0–47.0)
Hemoglobin: 10.4 g/dL — ABNORMAL LOW (ref 12.0–16.0)
Lymphocytes Relative: 10 %
Lymphs Abs: 1.3 10*3/uL (ref 1.0–3.6)
MCH: 30.2 pg (ref 26.0–34.0)
MCHC: 33 g/dL (ref 32.0–36.0)
MCV: 91.4 fL (ref 80.0–100.0)
Monocytes Absolute: 0.8 10*3/uL (ref 0.2–0.9)
Monocytes Relative: 6 %
Neutro Abs: 10.2 10*3/uL — ABNORMAL HIGH (ref 1.4–6.5)
Neutrophils Relative %: 79 %
Platelets: 317 10*3/uL (ref 150–440)
RBC: 3.44 MIL/uL — ABNORMAL LOW (ref 3.80–5.20)
RDW: 19.3 % — ABNORMAL HIGH (ref 11.5–14.5)
WBC: 13 10*3/uL — ABNORMAL HIGH (ref 3.6–11.0)

## 2015-12-23 MED ORDER — FLUOROURACIL CHEMO INJECTION 2.5 GM/50ML
400.0000 mg/m2 | Freq: Once | INTRAVENOUS | Status: AC
  Administered 2015-12-23: 700 mg via INTRAVENOUS
  Filled 2015-12-23: qty 14

## 2015-12-23 MED ORDER — PALONOSETRON HCL INJECTION 0.25 MG/5ML
0.2500 mg | Freq: Once | INTRAVENOUS | Status: AC
  Administered 2015-12-23: 0.25 mg via INTRAVENOUS
  Filled 2015-12-23: qty 5

## 2015-12-23 MED ORDER — ATROPINE SULFATE 1 MG/ML IJ SOLN
0.5000 mg | Freq: Once | INTRAMUSCULAR | Status: AC | PRN
  Administered 2015-12-23: 0.5 mg via INTRAVENOUS
  Filled 2015-12-23: qty 1

## 2015-12-23 MED ORDER — SODIUM CHLORIDE 0.9 % IV SOLN
10.0000 mg | Freq: Once | INTRAVENOUS | Status: AC
  Administered 2015-12-23: 10 mg via INTRAVENOUS
  Filled 2015-12-23: qty 1

## 2015-12-23 MED ORDER — IRINOTECAN HCL CHEMO INJECTION 100 MG/5ML
75.0000 mg/m2 | Freq: Once | INTRAVENOUS | Status: AC
  Administered 2015-12-23: 140 mg via INTRAVENOUS
  Filled 2015-12-23: qty 5

## 2015-12-23 MED ORDER — HEPARIN SOD (PORK) LOCK FLUSH 100 UNIT/ML IV SOLN
500.0000 [IU] | Freq: Once | INTRAVENOUS | Status: DC | PRN
  Filled 2015-12-23: qty 5

## 2015-12-23 MED ORDER — SODIUM CHLORIDE 0.9% FLUSH
10.0000 mL | INTRAVENOUS | Status: DC | PRN
  Administered 2015-12-23: 10 mL
  Filled 2015-12-23: qty 10

## 2015-12-23 MED ORDER — LEUCOVORIN CALCIUM INJECTION 350 MG
400.0000 mg/m2 | Freq: Once | INTRAVENOUS | Status: AC
  Administered 2015-12-23: 700 mg via INTRAVENOUS
  Filled 2015-12-23: qty 25

## 2015-12-23 MED ORDER — SODIUM CHLORIDE 0.9 % IV SOLN
2400.0000 mg/m2 | INTRAVENOUS | Status: DC
  Administered 2015-12-23: 4200 mg via INTRAVENOUS
  Filled 2015-12-23: qty 84

## 2015-12-23 MED ORDER — SODIUM CHLORIDE 0.9 % IV SOLN
Freq: Once | INTRAVENOUS | Status: AC
  Administered 2015-12-23: 12:00:00 via INTRAVENOUS
  Filled 2015-12-23: qty 1000

## 2015-12-23 NOTE — Progress Notes (Signed)
Patient accompanied by her daughter today who states PCP stopped Toprol on Friday.  Patient has no complaints or concerns today.

## 2015-12-23 NOTE — Progress Notes (Signed)
Dose reduction of irinotecan per MD.

## 2015-12-24 ENCOUNTER — Telehealth: Payer: Self-pay | Admitting: *Deleted

## 2015-12-24 ENCOUNTER — Other Ambulatory Visit: Payer: Self-pay | Admitting: *Deleted

## 2015-12-24 DIAGNOSIS — C189 Malignant neoplasm of colon, unspecified: Secondary | ICD-10-CM

## 2015-12-24 DIAGNOSIS — C787 Secondary malignant neoplasm of liver and intrahepatic bile duct: Principal | ICD-10-CM

## 2015-12-24 LAB — CEA: CEA: 641.3 ng/mL — ABNORMAL HIGH (ref 0.0–4.7)

## 2015-12-24 NOTE — Telephone Encounter (Signed)
Notified per Dr Mike Gip to continue Imodium and if it does not control it call back for something else. She repeated this back to me

## 2015-12-24 NOTE — Telephone Encounter (Signed)
Called to report that she has had 6 liquid stools in past 24 hours. She gave 2 Imodium tabs last night

## 2015-12-25 ENCOUNTER — Inpatient Hospital Stay: Payer: Medicare Other

## 2015-12-25 VITALS — BP 113/75 | HR 76 | Temp 97.0°F | Resp 18

## 2015-12-25 DIAGNOSIS — C787 Secondary malignant neoplasm of liver and intrahepatic bile duct: Principal | ICD-10-CM

## 2015-12-25 DIAGNOSIS — C189 Malignant neoplasm of colon, unspecified: Secondary | ICD-10-CM

## 2015-12-25 DIAGNOSIS — Z5111 Encounter for antineoplastic chemotherapy: Secondary | ICD-10-CM | POA: Diagnosis not present

## 2015-12-25 MED ORDER — PEGFILGRASTIM INJECTION 6 MG/0.6ML ~~LOC~~
6.0000 mg | PREFILLED_SYRINGE | Freq: Once | SUBCUTANEOUS | Status: AC
  Administered 2015-12-25: 6 mg via SUBCUTANEOUS
  Filled 2015-12-25: qty 0.6

## 2015-12-25 MED ORDER — SODIUM CHLORIDE 0.9% FLUSH
10.0000 mL | INTRAVENOUS | Status: DC | PRN
  Administered 2015-12-25: 10 mL
  Filled 2015-12-25: qty 10

## 2015-12-25 MED ORDER — HEPARIN SOD (PORK) LOCK FLUSH 100 UNIT/ML IV SOLN
500.0000 [IU] | Freq: Once | INTRAVENOUS | Status: AC | PRN
  Administered 2015-12-25: 500 [IU]

## 2015-12-25 MED ORDER — HEPARIN SOD (PORK) LOCK FLUSH 100 UNIT/ML IV SOLN
INTRAVENOUS | Status: AC
  Filled 2015-12-25: qty 5

## 2015-12-26 ENCOUNTER — Telehealth: Payer: Self-pay | Admitting: *Deleted

## 2015-12-26 MED ORDER — DIPHENOXYLATE-ATROPINE 2.5-0.025 MG PO TABS: ORAL_TABLET | ORAL | 1 refills | Status: DC

## 2015-12-26 NOTE — Telephone Encounter (Signed)
  Let's follow-up with her and make sure the Lomotil works.  We can see her tomorrow for labs (BMP) and possible fluids.  M

## 2015-12-26 NOTE — Telephone Encounter (Signed)
Continuing diarrhea, has had 8 BM since yesterday morning. Imodium not controlling

## 2015-12-27 NOTE — Telephone Encounter (Signed)
Called and spoke to patient's daughter, [name], who informed me that they did not pick up the Lomotil prescription yesterday. She plans to get it today.  She stated patient had only had one loose stool today.   She also stated home health nurse was there yesterday and advised them to [place] BRAT diet.  I advised her to take patient to ER per VO of Dr. Mike Gip if diarrhea continues.  Also advised Gatorade or Life Water to help with dehydration.  Verbalized understanding.

## 2015-12-30 ENCOUNTER — Inpatient Hospital Stay: Payer: Medicare Other

## 2015-12-30 ENCOUNTER — Telehealth: Payer: Self-pay | Admitting: *Deleted

## 2015-12-30 ENCOUNTER — Other Ambulatory Visit: Payer: Self-pay

## 2015-12-30 DIAGNOSIS — C787 Secondary malignant neoplasm of liver and intrahepatic bile duct: Principal | ICD-10-CM

## 2015-12-30 DIAGNOSIS — C189 Malignant neoplasm of colon, unspecified: Secondary | ICD-10-CM

## 2015-12-30 LAB — CBC WITH DIFFERENTIAL/PLATELET
Basophils Absolute: 0 10*3/uL (ref 0–0.1)
Basophils Relative: 0 %
Eosinophils Absolute: 0 10*3/uL (ref 0–0.7)
Eosinophils Relative: 2 %
HCT: 30.5 % — ABNORMAL LOW (ref 35.0–47.0)
Hemoglobin: 10.2 g/dL — ABNORMAL LOW (ref 12.0–16.0)
Lymphocytes Relative: 59 %
Lymphs Abs: 0.6 10*3/uL — ABNORMAL LOW (ref 1.0–3.6)
MCH: 30.5 pg (ref 26.0–34.0)
MCHC: 33.4 g/dL (ref 32.0–36.0)
MCV: 91.4 fL (ref 80.0–100.0)
Monocytes Absolute: 0 10*3/uL — ABNORMAL LOW (ref 0.2–0.9)
Monocytes Relative: 3 %
Neutro Abs: 0.4 10*3/uL — ABNORMAL LOW (ref 1.4–6.5)
Neutrophils Relative %: 36 %
Platelets: 125 10*3/uL — ABNORMAL LOW (ref 150–440)
RBC: 3.33 MIL/uL — ABNORMAL LOW (ref 3.80–5.20)
RDW: 17.6 % — ABNORMAL HIGH (ref 11.5–14.5)
WBC: 1.1 10*3/uL — CL (ref 3.6–11.0)

## 2015-12-30 NOTE — Telephone Encounter (Signed)
Called daughter [name] and she states pt feels good today, diarrhea almost completely gone and pt had no fever.  Went over neutropenic precautions with her daughter [name].  She will call if diarrhea persists and call if she has fever.

## 2015-12-30 NOTE — Telephone Encounter (Signed)
-----   Message from Lequita Asal, MD sent at 12/30/2015  2:36 PM EDT ----- Regarding: Lab review  Neutropenic precautions.  How is she doing?  M  ----- Message ----- From: Interface, Lab In [place] Sent: 12/30/2015  12:02 PM To: Lequita Asal, MD

## 2015-12-31 ENCOUNTER — Emergency Department: Payer: Medicare Other

## 2015-12-31 ENCOUNTER — Encounter: Payer: Self-pay | Admitting: Emergency Medicine

## 2015-12-31 ENCOUNTER — Other Ambulatory Visit: Payer: Self-pay

## 2015-12-31 ENCOUNTER — Inpatient Hospital Stay
Admission: EM | Admit: 2015-12-31 | Discharge: 2016-01-05 | DRG: 808 | Disposition: A | Discharge status: Home Health | Payer: Medicare Other | Attending: Internal Medicine | Admitting: Internal Medicine

## 2015-12-31 DIAGNOSIS — E1140 Type 2 diabetes mellitus with diabetic neuropathy, unspecified: Secondary | ICD-10-CM | POA: Diagnosis present

## 2015-12-31 DIAGNOSIS — R630 Anorexia: Secondary | ICD-10-CM | POA: Diagnosis present

## 2015-12-31 DIAGNOSIS — E782 Mixed hyperlipidemia: Secondary | ICD-10-CM | POA: Diagnosis present

## 2015-12-31 DIAGNOSIS — R509 Fever, unspecified: Secondary | ICD-10-CM

## 2015-12-31 DIAGNOSIS — M6281 Muscle weakness (generalized): Secondary | ICD-10-CM

## 2015-12-31 DIAGNOSIS — G9340 Encephalopathy, unspecified: Secondary | ICD-10-CM

## 2015-12-31 DIAGNOSIS — H9190 Unspecified hearing loss, unspecified ear: Secondary | ICD-10-CM | POA: Diagnosis present

## 2015-12-31 DIAGNOSIS — Z79899 Other long term (current) drug therapy: Secondary | ICD-10-CM

## 2015-12-31 DIAGNOSIS — A419 Sepsis, unspecified organism: Secondary | ICD-10-CM

## 2015-12-31 DIAGNOSIS — D6481 Anemia due to antineoplastic chemotherapy: Secondary | ICD-10-CM | POA: Diagnosis present

## 2015-12-31 DIAGNOSIS — E872 Acidosis, unspecified: Secondary | ICD-10-CM

## 2015-12-31 DIAGNOSIS — C189 Malignant neoplasm of colon, unspecified: Secondary | ICD-10-CM | POA: Diagnosis present

## 2015-12-31 DIAGNOSIS — I480 Paroxysmal atrial fibrillation: Secondary | ICD-10-CM | POA: Diagnosis present

## 2015-12-31 DIAGNOSIS — Y92538 Other ambulatory health services establishments as the place of occurrence of the external cause: Secondary | ICD-10-CM | POA: Diagnosis present

## 2015-12-31 DIAGNOSIS — R1032 Left lower quadrant pain: Secondary | ICD-10-CM | POA: Diagnosis present

## 2015-12-31 DIAGNOSIS — R5081 Fever presenting with conditions classified elsewhere: Secondary | ICD-10-CM

## 2015-12-31 DIAGNOSIS — D6959 Other secondary thrombocytopenia: Secondary | ICD-10-CM | POA: Diagnosis present

## 2015-12-31 DIAGNOSIS — D61818 Other pancytopenia: Secondary | ICD-10-CM

## 2015-12-31 DIAGNOSIS — Z79818 Long term (current) use of other agents affecting estrogen receptors and estrogen levels: Secondary | ICD-10-CM

## 2015-12-31 DIAGNOSIS — Z66 Do not resuscitate: Secondary | ICD-10-CM | POA: Diagnosis present

## 2015-12-31 DIAGNOSIS — D709 Neutropenia, unspecified: Secondary | ICD-10-CM

## 2015-12-31 DIAGNOSIS — T451X5A Adverse effect of antineoplastic and immunosuppressive drugs, initial encounter: Secondary | ICD-10-CM | POA: Diagnosis present

## 2015-12-31 DIAGNOSIS — R197 Diarrhea, unspecified: Secondary | ICD-10-CM

## 2015-12-31 DIAGNOSIS — Z87891 Personal history of nicotine dependence: Secondary | ICD-10-CM | POA: Diagnosis not present

## 2015-12-31 DIAGNOSIS — C787 Secondary malignant neoplasm of liver and intrahepatic bile duct: Secondary | ICD-10-CM | POA: Diagnosis present

## 2015-12-31 DIAGNOSIS — D701 Agranulocytosis secondary to cancer chemotherapy: Principal | ICD-10-CM | POA: Diagnosis present

## 2015-12-31 DIAGNOSIS — I10 Essential (primary) hypertension: Secondary | ICD-10-CM | POA: Diagnosis present

## 2015-12-31 DIAGNOSIS — Z7984 Long term (current) use of oral hypoglycemic drugs: Secondary | ICD-10-CM

## 2015-12-31 DIAGNOSIS — R4182 Altered mental status, unspecified: Secondary | ICD-10-CM | POA: Diagnosis not present

## 2015-12-31 DIAGNOSIS — D571 Sickle-cell disease without crisis: Secondary | ICD-10-CM | POA: Diagnosis present

## 2015-12-31 DIAGNOSIS — R2681 Unsteadiness on feet: Secondary | ICD-10-CM

## 2015-12-31 DIAGNOSIS — G9341 Metabolic encephalopathy: Secondary | ICD-10-CM | POA: Diagnosis present

## 2015-12-31 DIAGNOSIS — R2689 Other abnormalities of gait and mobility: Secondary | ICD-10-CM

## 2015-12-31 LAB — CBC WITH DIFFERENTIAL/PLATELET
BASOS ABS: 0 10*3/uL (ref 0–0.1)
Basophils Relative: 1 %
EOS ABS: 0 10*3/uL (ref 0–0.7)
Eosinophils Relative: 4 %
HEMATOCRIT: 26.4 % — AB (ref 35.0–47.0)
HEMOGLOBIN: 8.7 g/dL — AB (ref 12.0–16.0)
LYMPHS PCT: 74 %
Lymphs Abs: 0.4 10*3/uL — ABNORMAL LOW (ref 1.0–3.6)
MCH: 30.7 pg (ref 26.0–34.0)
MCHC: 32.9 g/dL (ref 32.0–36.0)
MCV: 93.2 fL (ref 80.0–100.0)
MONO ABS: 0 10*3/uL — AB (ref 0.2–0.9)
Monocytes Relative: 4 %
NEUTROS PCT: 17 %
Neutro Abs: 0.1 10*3/uL — ABNORMAL LOW (ref 1.4–6.5)
Platelets: 67 10*3/uL — ABNORMAL LOW (ref 150–440)
RBC: 2.83 MIL/uL — AB (ref 3.80–5.20)
RDW: 17.2 % — ABNORMAL HIGH (ref 11.5–14.5)
WBC: 0.5 10*3/uL — AB (ref 3.6–11.0)

## 2015-12-31 LAB — URINALYSIS COMPLETE WITH MICROSCOPIC (ARMC ONLY)
BACTERIA UA: NONE SEEN
Bilirubin Urine: NEGATIVE
GLUCOSE, UA: NEGATIVE mg/dL
Hgb urine dipstick: NEGATIVE
Ketones, ur: NEGATIVE mg/dL
Leukocytes, UA: NEGATIVE
NITRITE: NEGATIVE
PROTEIN: NEGATIVE mg/dL
SPECIFIC GRAVITY, URINE: 1.012 (ref 1.005–1.030)
Squamous Epithelial / LPF: NONE SEEN
pH: 5 (ref 5.0–8.0)

## 2015-12-31 LAB — COMPREHENSIVE METABOLIC PANEL
ALK PHOS: 98 U/L (ref 38–126)
ALT: 9 U/L — AB (ref 14–54)
AST: 16 U/L (ref 15–41)
Albumin: 2.6 g/dL — ABNORMAL LOW (ref 3.5–5.0)
Anion gap: 6 (ref 5–15)
BUN: 8 mg/dL (ref 6–20)
CALCIUM: 8.9 mg/dL (ref 8.9–10.3)
CHLORIDE: 109 mmol/L (ref 101–111)
CO2: 23 mmol/L (ref 22–32)
CREATININE: 1 mg/dL (ref 0.44–1.00)
GFR, EST NON AFRICAN AMERICAN: 55 mL/min — AB (ref >60)
Glucose, Bld: 158 mg/dL — ABNORMAL HIGH (ref 65–99)
Potassium: 3.8 mmol/L (ref 3.5–5.1)
SODIUM: 138 mmol/L (ref 135–145)
Total Bilirubin: 0.7 mg/dL (ref 0.3–1.2)
Total Protein: 5.9 g/dL — ABNORMAL LOW (ref 6.5–8.1)

## 2015-12-31 LAB — LACTIC ACID, PLASMA
LACTIC ACID, VENOUS: 2.3 mmol/L — AB (ref 0.5–1.9)
LACTIC ACID, VENOUS: 1.6 mmol/L (ref 0.5–1.9)

## 2015-12-31 LAB — AMMONIA

## 2015-12-31 MED ORDER — INSULIN ASPART 100 UNIT/ML ~~LOC~~ SOLN
0.0000 [IU] | Freq: Three times a day (TID) | SUBCUTANEOUS | Status: DC
  Administered 2016-01-01 – 2016-01-03 (×2): 1 [IU] via SUBCUTANEOUS
  Filled 2015-12-31 (×2): qty 1

## 2015-12-31 MED ORDER — ATORVASTATIN CALCIUM 20 MG PO TABS
40.0000 mg | ORAL_TABLET | Freq: Every day | ORAL | Status: DC
  Administered 2016-01-01 – 2016-01-05 (×5): 40 mg via ORAL
  Filled 2015-12-31 (×5): qty 2

## 2015-12-31 MED ORDER — ONDANSETRON HCL 4 MG/2ML IJ SOLN: 4.0000 mg | Freq: Four times a day (QID) | INTRAMUSCULAR | Status: DC | PRN

## 2015-12-31 MED ORDER — VANCOMYCIN HCL 10 G IV SOLR
1500.0000 mg | Freq: Once | INTRAVENOUS | Status: DC
  Filled 2015-12-31: qty 1500

## 2015-12-31 MED ORDER — AMIODARONE HCL 200 MG PO TABS
100.0000 mg | ORAL_TABLET | Freq: Every day | ORAL | Status: DC
  Administered 2016-01-01 – 2016-01-02 (×2): 100 mg via ORAL
  Filled 2015-12-31 (×2): qty 1

## 2015-12-31 MED ORDER — DEXTROSE 5 % IV SOLN
2.0000 g | Freq: Once | INTRAVENOUS | Status: AC
  Administered 2015-12-31: 2 g via INTRAVENOUS
  Filled 2015-12-31: qty 2

## 2015-12-31 MED ORDER — POTASSIUM CHLORIDE IN NACL 20-0.9 MEQ/L-% IV SOLN
INTRAVENOUS | Status: DC
  Administered 2015-12-31 – 2016-01-01 (×2): via INTRAVENOUS
  Filled 2015-12-31 (×3): qty 1000

## 2015-12-31 MED ORDER — SODIUM CHLORIDE 0.9 % IV BOLUS (SEPSIS)
500.0000 mL | Freq: Once | INTRAVENOUS | Status: AC
Start: 2015-12-31 — End: 2015-12-31
  Administered 2015-12-31: 500 mL via INTRAVENOUS

## 2015-12-31 MED ORDER — SODIUM CHLORIDE 0.9 % IV BOLUS (SEPSIS)
1000.0000 mL | Freq: Once | INTRAVENOUS | Status: AC
  Administered 2015-12-31: 1000 mL via INTRAVENOUS

## 2015-12-31 MED ORDER — DEXTROSE 5 % IV SOLN
2.0000 g | Freq: Two times a day (BID) | INTRAVENOUS | Status: DC
  Administered 2016-01-01 – 2016-01-03 (×5): 2 g via INTRAVENOUS
  Filled 2015-12-31 (×9): qty 2

## 2015-12-31 MED ORDER — DEXTROSE 5 % IV SOLN: 2.0000 g | Freq: Once | INTRAVENOUS | Status: DC

## 2015-12-31 MED ORDER — VANCOMYCIN HCL 10 G IV SOLR
1500.0000 mg | Freq: Once | INTRAVENOUS | Status: AC
  Administered 2015-12-31: 1500 mg via INTRAVENOUS
  Filled 2015-12-31: qty 1500

## 2015-12-31 MED ORDER — ONDANSETRON HCL 4 MG PO TABS: 4.0000 mg | ORAL_TABLET | Freq: Four times a day (QID) | ORAL | Status: DC | PRN

## 2015-12-31 MED ORDER — SODIUM CHLORIDE 0.9 % IV BOLUS (SEPSIS)
1000.0000 mL | Freq: Once | INTRAVENOUS | Status: AC
Start: 2015-12-31 — End: 2015-12-31
  Administered 2015-12-31: 1000 mL via INTRAVENOUS

## 2015-12-31 NOTE — ED Notes (Signed)
Unable to obtain second blood culture, will start antibiotic per Dr. Alfred Levins.

## 2015-12-31 NOTE — H&P (Addendum)
[facility] NAME: Sandy [name]    MR#:  [id number]  DATE OF BIRTH:  20-Sep-1945  DATE OF ADMISSION:  12/31/2015  PRIMARY CARE PHYSICIAN: Harriett Neita Carp, MD   REQUESTING/REFERRING PHYSICIAN:   CHIEF COMPLAINT:   Chief Complaint  Patient presents with  . Altered Mental Status  . Abnormal Lab    HISTORY OF PRESENT ILLNESS: Sandy [name]  is a 70 y.o. female with a known history of Metastatic colon cancer, diabetes mellitus, hypertension, neuropathy, sickle cell anemia, difficulty hearing, who presents to the hospital with complaints of poor responsiveness which started earlier today. According to the the patient's family the patient had chemotherapy about 8 days ago. She was seen by primary oncologist yesterday, however the Focalin was found to be low at 1.1, she was given Neulasta? and send her home. The she was noted to be more confused early in the morning, was not speaking, was less alert, refused the favorite food, bacon to eat. On arrival to the hospital she was noted to have even lower blood cell count, also worsening anemia, thrombocytopenia. Lactic acid level was also elevated. The temperature was elevated to 101.5. Hospitalist services were contacted for admission. The patient complained of diarrhea over the past one week intermittently. She was noted to have incontinence of urine earlier today, per patient's family. Patient  denies any pain . It is of systems difficult to obtain this patient remains somewhat somnolent.      Diagnosis Date  . Cancer ([place])    liver mets; colon cancer  . Constipation   . Diabetes mellitus without complication ([place])   . HOH (hard of hearing)   . Hypertension   . Insomnia   . 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 Sickle cell anemia (H[facility])   . Wears dentures    partial upper and lower    PAST SURGICAL HISTORY: Past Surgical History:  Procedure Laterality Date   . CARDIAC CATHETERIZATION    . COLONOSCOPY WITH PROPOFOL N/A 09/07/2014   Procedure: COLONOSCOPY WITH PROPOFOL;  Surgeon: Lucilla Lame, MD;  Location: [facility] ENDOSCOPY;  Service: Endoscopy;  Laterality: N/A;  . ESOPHAGOGASTRODUODENOSCOPY N/A 09/07/2014   Procedure: ESOPHAGOGASTRODUODENOSCOPY (EGD);  Surgeon: Lucilla Lame, MD;  Location: [facility] ENDOSCOPY;  Service: Endoscopy;  Laterality: N/A;  . FLEXIBLE SIGMOIDOSCOPY N/A 09/02/2015   Procedure: FLEXIBLE SIGMOIDOSCOPY;  Surgeon: Lucilla Lame, MD;  Location: [place];  Service: Endoscopy;  Laterality: N/A;  Diabetic - oral meds Pt has port-a-cath  . LIVER BIOPSY    . PORTACATH PLACEMENT Left 09/24/2014   Procedure: INSERTION PORT-A-CATH;  Surgeon: Robert Bellow, MD;  Location: [facility] ORS;  Service: General;  Laterality: Left;    SOCIAL HISTORY:  Social History  Substance Use Topics  . Smoking status: Former Smoker    Packs/day: 0.25    Years: 20.00    Types: Cigarettes    Quit date: 09/06/2013  . Smokeless tobacco: Never Used  . Alcohol use No    FAMILY HISTORY:  Family History  Problem Relation Age of Onset  . Cancer Sister     DRUG ALLERGIES:  Allergies  Allergen Reactions  . Tylenol [Acetaminophen] Nausea And Vomiting    Review of Systems  Unable to perform ROS: Mental acuity    MEDICATIONS AT HOME:  Prior to Admission medications   Medication Sig Start Date End Date Taking? Authorizing Provider  amiodarone (PACERONE) 100 MG tablet  Take 100 mg by mouth daily.   Yes Historical Provider, MD  aspirin EC 81 MG tablet Take 81 mg by mouth daily. Reported on 08/26/2015   Yes Historical Provider, MD  atorvastatin (LIPITOR) 40 MG tablet Take 40 mg by mouth daily.   Yes Historical Provider, MD  diphenoxylate-atropine (LOMOTIL) 2.5-0.025 MG tablet 1-2 tablets every 6 hours as needed for diarrhea 12/26/15  Yes Lequita Asal, MD  gabapentin (NEURONTIN) 100 MG capsule Take 2 capsules (200 mg total) by mouth at bedtime. 1 tab  daily and 2 tabs at bedtime. Patient taking differently: Take 200 mg by mouth at bedtime. 1 tab in the morning daily and 2 capsules at bedtime. 07/16/15  Yes Lequita Asal, MD  lisinopril (PRINIVIL,ZESTRIL) 10 MG tablet Take 10 mg by mouth daily.   Yes Historical Provider, MD  magnesium 30 MG tablet Take 30 mg by mouth daily.    Yes Historical Provider, MD  megestrol (MEGACE) 400 MG/10ML suspension Take 5 mLs (200 mg total) by mouth daily. to stimulate appetite 11/06/14  Yes Lequita Asal, MD  metFORMIN (GLUCOPHAGE) 1000 MG tablet Take 1,000 mg by mouth 2 (two) times daily.    Yes Historical Provider, MD  Multiple Vitamin (MULTIVITAMIN WITH MINERALS) TABS tablet Take 1 tablet by mouth daily.   Yes Historical Provider, MD  ondansetron (ZOFRAN) 4 MG tablet Take 1 tablet (4 mg total) by mouth every 8 (eight) hours as needed for nausea or vomiting. 12/19/15  Yes Lequita Asal, MD  oxyCODONE (OXY IR/ROXICODONE) 5 MG immediate release tablet Take 1 tablet (5 mg total) by mouth every 6 (six) hours as needed for severe pain. 12/19/15  Yes Lequita Asal, MD  traZODone (DESYREL) 50 MG tablet Take 0.5 tablets (25 mg total) by mouth at bedtime as needed for sleep. 10/22/15  Yes Vaughan Basta, MD  docusate sodium (COLACE) 100 MG capsule Take 1 capsule (100 mg total) by mouth 2 (two) times daily. Patient not taking: Reported on 12/31/2015 10/22/15   Vaughan Basta, MD  feeding supplement, ENSURE ENLIVE, (ENSURE ENLIVE) LIQD Take 237 mLs by mouth 2 (two) times daily between meals. Patient not taking: Reported on 12/31/2015 10/22/15   Vaughan Basta, MD  lidocaine-prilocaine (EMLA) cream Apply 1 application topically as needed. Apply to port 1 hour prior to chemotherapy appointment. Cover with plastic wrap. 07/08/15   Lequita Asal, MD      PHYSICAL EXAMINATION:   VITAL SIGNS: Blood pressure (!) 152/78, pulse 86, temperature 99.4 F (37.4 C), temperature source Oral, resp. rate  17, height 5\' 6"  (1.676 m), weight 67.7 kg (149 lb 4.8 oz), SpO2 99 %.  GENERAL:  70 y.o.-year-old patient lying in the bed with no acute distress, Somewhat somnolent, but arousable, able to answer a few questions, not able to follow commands .  EYES: Pupils equal, round, reactive to light and accommodation. No scleral icterus. Extraocular muscles intact.  HEENT: Head atraumatic, normocephalic. Oropharynx and nasopharynx clear.  NECK:  Supple, no jugular venous distention. No thyroid enlargement, no tenderness.  LUNGS: Normal breath sounds bilaterally, no wheezing, rales,rhonchi or crepitation. No use of accessory muscles of respiration.  CARDIOVASCULAR: S1, S2 normal. No murmurs, rubs, or gallops.  ABDOMEN: Soft, nontender, nondistended. Bowel sounds present. No organomegaly or mass.  EXTREMITIES: No pedal edema, cyanosis, or clubbing.  NEUROLOGIC: Cranial nerves II through XII are intact. Muscle strength 3/5 in all extremities. Sensation intact. Gait not checked.  PSYCHIATRIC: The patient is Somewhat somnolent, but able  to open eyes, does not converse, but insists few questions, unable to assess orientation, is not able to follow commands   SKIN: No obvious rash, lesion, or ulcer.   LABORATORY PANEL:   CBC  Recent Labs Lab 12/30/15 1144 12/31/15 1749  WBC 1.1* 0.5*  HGB 10.2* 8.7*  HCT 30.5* 26.4*  PLT 125* 67*  MCV 91.4 93.2  MCH 30.5 30.7  MCHC 33.4 32.9  RDW 17.6* 17.2*  LYMPHSABS 0.6* 0.4*  MONOABS 0.0* 0.0*  EOSABS 0.0 0.0  BASOSABS 0.0 0.0   ------------------------------------------------------------------------------------------------------------------  Chemistries   Recent Labs Lab 12/31/15 1749  NA 138  K 3.8  CL 109  CO2 23  GLUCOSE 158*  BUN 8  CREATININE 1.00  CALCIUM 8.9  AST 16  ALT 9*  ALKPHOS 98  BILITOT 0.7   ------------------------------------------------------------------------------------------------------------------  Cardiac  Enzymes No results for input(s): TROPONINI in the last 168 hours. ------------------------------------------------------------------------------------------------------------------  RADIOLOGY: Dg Chest 2 View  Result Date: 12/31/2015 CLINICAL DATA:  Fever since this morning. History of sickle cell anemia. EXAM: CHEST  2 VIEW COMPARISON:  Chest x-ray 11/09/2015 FINDINGS: The heart is mildly enlarged but stable. The power port is stable. The tip is in the right atrium. The lungs are clear. No pleural effusion. The bony thorax is intact. IMPRESSION: Mild stable cardiac enlargement. No infiltrates, edema or effusions. Electronically Signed   By: Marijo Sanes M.D.   On: 12/31/2015 18:15   Ct Head Wo Contrast  Result Date: 12/31/2015 CLINICAL DATA:  Acute onset of altered mental status. Decreased white blood cell count. Diarrhea. Initial encounter. EXAM: CT HEAD WITHOUT CONTRAST TECHNIQUE: Contiguous axial images were obtained from the base of the skull through the vertex without intravenous contrast. COMPARISON:  CT of the head performed 11/06/2015, and MRI of the brain performed 11/09/2015 FINDINGS: Brain: No evidence of acute infarction, hemorrhage, hydrocephalus, extra-axial collection or mass lesion/mass effect. Prominence of the ventricles and sulci reflects mild cortical volume loss. Scattered periventricular and subcortical white matter change likely reflects small vessel ischemic microangiopathy. The brainstem and fourth ventricle are within normal limits. The basal ganglia are unremarkable in appearance. The cerebral hemispheres demonstrate grossly normal gray-white differentiation. No mass effect or midline shift is seen. Vascular: No hyperdense vessel or unexpected calcification. Skull: There is no evidence of fracture; visualized osseous structures are unremarkable in appearance. Sinuses/Orbits: The visualized portions of the orbits are within normal limits. There is opacification of the left  mastoid air cells. The paranasal sinuses and right mastoid air cells are well-aerated. Other: No significant soft tissue abnormalities are seen. IMPRESSION: 1. No acute intracranial pathology seen on CT. 2. Mild cortical volume loss and scattered small vessel ischemic microangiopathy. 3. Opacification of the left mastoid air cells. Electronically Signed   By: Garald Balding M.D.   On: 12/31/2015 18:50    EKG: Orders placed or performed in visit on 12/31/15  . EKG 12-Lead  EKG in the emergency room revealed sinus rhythm at 89 this limited normal axis and borderlineT wave abnormalities, no acute ST-T changes  IMPRESSION AND PLAN:  Active Problems:   Neutropenic fever (HCC)   Lactic acidosis   Acute encephalopathy   Diarrhea   Pancytopenia (HCC)  #1 neutropenic fever, admitted fish medical floor, initiate her on vancomycin and cefepime, getting blood cultures, it isn't about his depending on culture results   #2 lactic acidosis, continue IV fluids, follow lactic acid level #3 acute encephalopathy, neuro checks every 4 hours, supportive therapy  #4 diarrhea,  get stool cultures, including C. difficile, enteric precautions  #5 pancytopenia, gram-positive stool, follow with therapy, transfuse as needed, get oncologist involved recommendations  All the records are reviewed and case discussed with ED provider. Management plans discussed with the patient, family and they are in agreement.  CODE STATUS: Code Status History    Date Active Date Inactive Code Status Order ID Comments User Context   11/11/2015  8:03 AM 11/12/2015  8:20 PM DNR [id number]  Hillary Bow, MD Inpatient   11/08/2015  2:54 PM 11/08/2015  5:44 PM DNR [id number]  Awilda Bill, NP Inpatient   11/07/2015  6:17 PM 11/08/2015  2:54 PM Full Code [id number]  Vaughan Basta, MD Inpatient   08/28/2014  4:37 PM 08/29/2014  8:42 PM Full Code [id number]  Theodoro Grist, MD Inpatient    Questions for Most Recent Historical Code Status  (Order [id number])    Question Answer Comment   In the event of cardiac or respiratory ARREST Do not call a "code blue"    In the event of cardiac or respiratory ARREST Do not perform Intubation, CPR, defibrillation or ACLS    In the event of cardiac or respiratory ARREST Use medication by any route, position, wound care, and other measures to relive pain and suffering. May use oxygen, suction and manual treatment of airway obstruction as needed for comfort.         Advance Directive Documentation   Flowsheet Row Most Recent Value  Type of Advance Directive  Healthcare Power of Attorney, Living will  Pre-existing out of facility DNR order (yellow form or pink MOST form)  No data  "MOST" Form in Place?  No data       TOTAL TIME TAKING CARE OF THIS PATIENT: 50 inutes.    Theodoro Grist M.D on 12/31/2015 at 8:43 PM  Between 7am to 6pm - Pager - [phone] After 6pm go to [email or web address] - password [email or web address] [facility]  Office  [phone]  CC: Primary care physician; [name], MD

## 2015-12-31 NOTE — Progress Notes (Signed)
Pharmacy Antibiotic Note  Sandy [name] is a 70 y.o. female admitted on 12/31/2015 with sepsis.  Pharmacy has been consulted for cefepime dosing.  Plan: Cefepime 2 grams q 12 hours ordered.  Height: 5\' 6"  (167.6 cm) Weight: 150 lb 8 oz (68.3 kg) IBW/kg (Calculated) : 59.3  Temp (24hrs), Avg:100.3 F (37.9 C), Min:99.4 F (37.4 C), Max:101.5 F (38.6 C)   Recent Labs Lab 12/30/15 1144 12/31/15 1749 12/31/15 1752 12/31/15 2047  WBC 1.1* 0.5*  --   --   CREATININE  --  1.00  --   --   LATICACIDVEN  --   --  2.3* 1.6    Estimated Creatinine Clearance: 47.6 mL/min (by C-G formula based on SCr of 1 mg/dL).    Allergies  Allergen Reactions  . Tylenol [Acetaminophen] Nausea And Vomiting    Antimicrobials this admission: cefepime  >>   vancomycin x1 >>   Dose adjustments this admission:   Microbiology results: 9/19 BCx: pending 9/19 UCx: pending  7/8 MRSA PCR: (-)    9/19 CXR: no infiltrate 9/19 UA: (-)  Thank you for allowing pharmacy to be a part of this patient's care.  Deletha Jaffee S 12/31/2015 11:09 PM

## 2015-12-31 NOTE — ED Triage Notes (Signed)
Patient presents to the ED with a low WBC count and behaving differently than normal per daughter.  Daughter reports patient has been having episodes of diarrhea since September 11th.  Patient had chemotherapy on the 11th last.  Patient has liver cancer, hypertension, and diabetes.  Patient is oriented to self, but not place, time or situation.

## 2015-12-31 NOTE — ED Provider Notes (Addendum)
[facility] Emergency Department Provider Note  ____________________________________________  Time seen: Approximately 5:57 PM  I have reviewed the triage vital signs and the nursing notes.   HISTORY  Chief Complaint Altered Mental Status and Abnormal Lab   HPI Sandy [name] is a 70 y.o. female history of stage IV metastatic colon cancer (last chemotherapy 12/23/15), diabetes, hypertension, sickle cell anemia, and Klebsiella bacteremia from urinary source in the beginning of August no longer on antibiotics presents for evaluation of altered mental status. History is gathered mostly from the daughter. According to her patient was doing well until yesterday evening. She was struggling with diarrhea however that had resolved. Daughter reports that the diarrhea started again at midnight. She has had 10 episodes of watery loose stools, no melena, no hematochezia. This morning she reports the patient was more confused, less attractive, was incontinent of urine which is not common for her. Daughter checked her temperature multiple times at home and she was afebrile. She had seen her oncologist yesterday and received a phone call today saying that her white count was low (WBC 1.1, ANC 0.4) and that she needed to come to the emergency room.  Past Medical History:  Diagnosis Date  . Cancer ([place])    liver mets; colon cancer  . Constipation   . Diabetes mellitus without complication ([place])   . HOH (hard of hearing)   . Hypertension   . Insomnia   . 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 Sickle cell anemia (HCC)   . Wears dentures    partial upper and lower    Patient Active Problem List   Diagnosis Date Noted  . DNR (do not resuscitate)   . Palliative care encounter   . Weakness generalized   . Other alteration of consciousness   . Poisoning by unspecified drug or medicinal substance, undetermined whether accidentally or purposely  inflicted(E980.5)   . Antineoplastic chemotherapy induced pancytopenia ([place])   . Drug-induced neutropenia ([place])   . Metastasis ([place])   . Altered mental status 11/07/2015  . Altered mental state 11/07/2015  . Fever and neutropenia ([place]) 10/18/2015  . Thyroid nodule 09/24/2015  . Personal history of colon cancer   . Thyroid nodule 08/30/2015  . Malignant neoplasm of sigmoid colon ([place]) 08/27/2015  . Hematochezia 06/10/2015  . Neuropathy ([place]) 04/29/2015  . Muscle pain, lumbar 02/12/2015  . Hypomagnesemia 01/15/2015  . Hypokalemia 10/23/2014  . Hyponatremia 10/21/2014  . Colon cancer ([place]) 09/21/2014  . Metastases to the liver ([place]) 09/21/2014  . Colon cancer metastasized to liver ([place]) 09/18/2014  . Cancer related pain 09/18/2014  . Dysuria 09/18/2014  . Anemia 09/18/2014  . Anorexia 09/18/2014  . Weight loss, unintentional 09/18/2014  . Benign essential HTN 09/03/2014  . Type 2 diabetes mellitus ([place]) 09/03/2014  . Personal history of nicotine dependence 09/03/2014  . Combined fat and carbohydrate induced hyperlipemia 09/03/2014  . Chest pain 08/28/2014    Past Surgical History:  Procedure Laterality Date  . CARDIAC CATHETERIZATION    . COLONOSCOPY WITH PROPOFOL N/A 09/07/2014   Procedure: COLONOSCOPY WITH PROPOFOL;  Surgeon: Lucilla Lame, MD;  Location: [facility] ENDOSCOPY;  Service: Endoscopy;  Laterality: N/A;  . ESOPHAGOGASTRODUODENOSCOPY N/A 09/07/2014   Procedure: ESOPHAGOGASTRODUODENOSCOPY (EGD);  Surgeon: Lucilla Lame, MD;  Location: [facility] ENDOSCOPY;  Service: Endoscopy;  Laterality: N/A;  . FLEXIBLE SIGMOIDOSCOPY N/A 09/02/2015   Procedure: FLEXIBLE SIGMOIDOSCOPY;  Surgeon: Lucilla Lame, MD;  Location: [place];  Service: Endoscopy;  Laterality: N/A;  Diabetic - oral meds Pt has port-a-cath  . LIVER BIOPSY    . PORTACATH PLACEMENT Left 09/24/2014   Procedure: INSERTION PORT-A-CATH;  Surgeon: Robert Bellow, MD;  Location: [facility] ORS;  Service: General;  Laterality:  Left;    Prior to Admission medications   Medication Sig Start Date End Date Taking? Authorizing Provider  amiodarone (PACERONE) 400 MG tablet Take 1 tablet (400 mg total) by mouth daily. Patient taking differently: Take 200 mg by mouth daily.  11/12/15   Hillary Bow, MD  aspirin EC 81 MG tablet Take 81 mg by mouth daily. Reported on 08/26/2015    Historical Provider, MD  atorvastatin (LIPITOR) 40 MG tablet Take 40 mg by mouth daily.    Historical Provider, MD  cefUROXime (CEFTIN) 500 MG tablet Take 0.5 tablets (250 mg total) by mouth 2 (two) times daily with a meal. 11/12/15   Hillary Bow, MD  diphenoxylate-atropine (LOMOTIL) 2.5-0.025 MG tablet 1-2 tablets every 6 hours as needed for diarrhea 12/26/15   Lequita Asal, MD  docusate sodium (COLACE) 100 MG capsule Take 1 capsule (100 mg total) by mouth 2 (two) times daily. 10/22/15   Vaughan Basta, MD  feeding supplement, ENSURE ENLIVE, (ENSURE ENLIVE) LIQD Take 237 mLs by mouth 2 (two) times daily between meals. 10/22/15   Vaughan Basta, MD  gabapentin (NEURONTIN) 100 MG capsule Take 2 capsules (200 mg total) by mouth at bedtime. 1 tab daily and 2 tabs at bedtime. 07/16/15   Lequita Asal, MD  lidocaine-prilocaine (EMLA) cream Apply 1 application topically as needed. Apply to port 1 hour prior to chemotherapy appointment. Cover with plastic wrap. 07/08/15   Lequita Asal, MD  lisinopril (PRINIVIL,ZESTRIL) 10 MG tablet Take 10 mg by mouth daily.    Historical Provider, MD  magnesium 30 MG tablet Take 30 mg by mouth daily.     Historical Provider, MD  megestrol (MEGACE) 400 MG/10ML suspension Take 5 mLs (200 mg total) by mouth daily. to stimulate appetite 11/06/14   Lequita Asal, MD  metFORMIN (GLUCOPHAGE) 1000 MG tablet Take 1,000 mg by mouth 2 (two) times daily.     Historical Provider, MD  Multiple Vitamin (MULTIVITAMIN WITH MINERALS) TABS tablet Take 1 tablet by mouth daily.    Historical Provider, MD  ondansetron  (ZOFRAN) 4 MG tablet Take 1 tablet (4 mg total) by mouth every 8 (eight) hours as needed for nausea or vomiting. 12/19/15   Lequita Asal, MD  oxyCODONE (OXY IR/ROXICODONE) 5 MG immediate release tablet Take 1 tablet (5 mg total) by mouth every 6 (six) hours as needed for severe pain. 12/19/15   Lequita Asal, MD  traZODone (DESYREL) 50 MG tablet Take 0.5 tablets (25 mg total) by mouth at bedtime as needed for sleep. 10/22/15   Vaughan Basta, MD    Allergies Tylenol [acetaminophen]  Family History  Problem Relation Age of Onset  . Cancer Sister     Social History Social History  Substance Use Topics  . Smoking status: Former Smoker    Packs/day: 0.25    Years: 20.00    Types: Cigarettes    Quit date: 09/06/2013  . Smokeless tobacco: Never Used  . Alcohol use No    Review of Systems Constitutional: + fever and confusion Eyes: Negative for visual changes. ENT: Negative for sore throat. Cardiovascular: Negative for chest pain. Respiratory: Negative for shortness of breath. Gastrointestinal: Negative for abdominal pain, vomiting. + diarrhea. Genitourinary: Negative  for dysuria. + Urinary incontinence Musculoskeletal: Negative for back pain. Skin: Negative for rash. Neurological: Negative for headaches, weakness or numbness.  ____________________________________________   PHYSICAL EXAM:  VITAL SIGNS: ED Triage Vitals  Enc Vitals Group     BP 12/31/15 1742 (!) 142/74     Pulse Rate 12/31/15 1742 89     Resp 12/31/15 1742 18     Temp 12/31/15 1742 (!) 101.5 F (38.6 C)     Temp Source 12/31/15 1742 Oral     SpO2 12/31/15 1742 100 %     Weight 12/31/15 1743 149 lb 4.8 oz (67.7 kg)     Height 12/31/15 1743 5\' 6"  (1.676 m)     Head Circumference --      Peak Flow --      Pain Score 12/31/15 1744 0     Pain Loc --      Pain Edu? --      Excl. in [place]? --    Constitutional: Awake, engages, answers question by moving her head yes or no, not speaking.   HEENT:      Head: Normocephalic and atraumatic.         Eyes: Conjunctivae are normal. Sclera is non-icteric. EOMI. PERRL      Mouth/Throat: Mucous membranes are dry.       Neck: Supple with no signs of meningismus. Cardiovascular: Regular rate and rhythm. No murmurs, gallops, or rubs. 2+ symmetrical distal pulses are present in all extremities. No JVD. Respiratory: Normal respiratory effort. Lungs are clear to auscultation bilaterally. No wheezes, crackles, or rhonchi.  Gastrointestinal: Soft, non tender, and non distended with positive bowel sounds. No rebound or guarding.rectal exam showing brown stool Hemoccult positive  Musculoskeletal: Nontender with normal range of motion in all extremities. No edema, cyanosis, or erythema of extremities. Neurologic: Face is symmetric. Moving all extremities. No gross focal neurologic deficits are appreciated. Skin: Skin is warm, dry and intact. No rash noted.  ____________________________________________   LABS (all labs ordered are listed, but only abnormal results are displayed)  Labs Reviewed  COMPREHENSIVE METABOLIC PANEL - Abnormal; Notable for the following:       Result Value   Glucose, Bld 158 (*)    Total Protein 5.9 (*)    Albumin 2.6 (*)    ALT 9 (*)    GFR calc non Af Amer 55 (*)    All other components within normal limits  URINALYSIS COMPLETEWITH MICROSCOPIC ([facility] ONLY) - Abnormal; Notable for the following:    Color, Urine YELLOW (*)    APPearance CLEAR (*)    All other components within normal limits  LACTIC ACID, PLASMA - Abnormal; Notable for the following:    Lactic Acid, Venous 2.3 (*)    All other components within normal limits  CBC WITH DIFFERENTIAL/PLATELET - Abnormal; Notable for the following:    WBC 0.5 (*)    RBC 2.83 (*)    Hemoglobin 8.7 (*)    HCT 26.4 (*)    RDW 17.2 (*)    Platelets 67 (*)    All other components within normal limits  CULTURE, BLOOD (ROUTINE X 2)  CULTURE, BLOOD (ROUTINE X 2)   URINE CULTURE  GASTROINTESTINAL PANEL BY PCR, STOOL (REPLACES STOOL CULTURE)  LACTIC ACID, PLASMA  AMMONIA   ____________________________________________  EKG  ED ECG REPORT I, Rudene Re, the attending physician, personally viewed and interpreted this ECG.  Normal sinus rhythm, rate of 89, normal intervals, normal axis, no ST elevations or depressions,  T-wave inversions in inferior and lateral leads. Unchanged from prior. ____________________________________________  RADIOLOGY  CXR: Negative  Head CT: Negative  __________________________________________   PROCEDURES  Procedure(s) performed: None Procedures Critical Care performed:  Yes  CRITICAL CARE Performed by: Rudene Re  ?  Total critical care time: 35 min  Critical care time was exclusive of separately billable procedures and treating other patients.  Critical care was necessary to treat or prevent imminent or life-threatening deterioration.  Critical care was time spent personally by me on the following activities: development of treatment plan with patient and/or surrogate as well as nursing, discussions with consultants, evaluation of patient's response to treatment, examination of patient, obtaining history from patient or surrogate, ordering and performing treatments and interventions, ordering and review of laboratory studies, ordering and review of radiographic studies, pulse oximetry and re-evaluation of patient's condition.  ____________________________________________   INITIAL IMPRESSION / ASSESSMENT AND PLAN / ED COURSE  70 y.o. female history of stage IV metastatic colon cancer (last chemotherapy 12/23/15), diabetes, hypertension, sickle cell anemia, and Klebsiella bacteremia from urinary source in the beginning of August no longer on antibiotics presents for evaluation of fever and altered mental status since this morning. VS showing fever, normal HR and BP.Patient with no  meningismus, answers questions by moving her head up and down for yes or no, not speaking, rectal exam showing brown stool Hemoccult positive, remainder of her physical exam is unremarkable. Presentation concerning for infection/ sepsis although patient does not meet SIRS criteria at this time.  Plan for labs, ammonia, blood culture, urinalysis, urine culture, stool culture, chest x-ray, head CT.  Clinical Course  Value Comment By Time  Bilirubin Urine: NEGATIVE (Reviewed) Rudene Re, MD 09/19 1912   Lactate of 2.3, white count of 0.5. hemoglobin is 8.7, it was 10.2 yesterday. UA and CXR clean. Pancytopenia. Head CT negative. Sepsis protocol activated. IVF, IV cefepime, and vancomycin ordered. Rudene Re, MD 09/19 1914    Pertinent labs & imaging results that were available during my care of the patient were reviewed by me and considered in my medical decision making (see chart for details).    ____________________________________________   FINAL CLINICAL IMPRESSION(S) / ED DIAGNOSES  Final diagnoses:  Sepsis, due to unspecified organism ([place])  Neutropenic fever ([place])  Diarrhea, unspecified type  Pancytopenia ([place])      NEW MEDICATIONS STARTED DURING THIS VISIT:  New Prescriptions   No medications on file     Note:  This document was prepared using Dragon voice recognition software and may include unintentional dictation errors.    Rudene Re, MD 12/31/15 1929    Rudene Re, MD 12/31/15 [phone]

## 2016-01-01 LAB — TROPONIN I
Troponin I: <0.03 ng/mL (ref <0.03)
Troponin I: <0.03 ng/mL (ref <0.03)

## 2016-01-01 LAB — CBC
HEMATOCRIT: 22.1 % — AB (ref 35.0–47.0)
Hemoglobin: 7.4 g/dL — ABNORMAL LOW (ref 12.0–16.0)
MCH: 30.9 pg (ref 26.0–34.0)
MCHC: 33.3 g/dL (ref 32.0–36.0)
MCV: 92.6 fL (ref 80.0–100.0)
PLATELETS: UNDETERMINED 10*3/uL (ref 150–440)
RBC: 2.39 MIL/uL — ABNORMAL LOW (ref 3.80–5.20)
RDW: 17.1 % — AB (ref 11.5–14.5)
WBC: 1.7 10*3/uL — AB (ref 3.6–11.0)

## 2016-01-01 LAB — BASIC METABOLIC PANEL
Anion gap: 5 (ref 5–15)
BUN: 7 mg/dL (ref 6–20)
CALCIUM: 8.2 mg/dL — AB (ref 8.9–10.3)
CHLORIDE: 118 mmol/L — AB (ref 101–111)
CO2: 19 mmol/L — ABNORMAL LOW (ref 22–32)
CREATININE: 0.8 mg/dL (ref 0.44–1.00)
GFR calc Af Amer: >60 mL/min (ref >60)
Glucose, Bld: 97 mg/dL (ref 65–99)
POTASSIUM: 3.9 mmol/L (ref 3.5–5.1)
Sodium: 142 mmol/L (ref 135–145)

## 2016-01-01 LAB — GLUCOSE, CAPILLARY
GLUCOSE-CAPILLARY: 96 mg/dL (ref 65–99)
Glucose-Capillary: 124 mg/dL — ABNORMAL HIGH (ref 65–99)
Glucose-Capillary: 76 mg/dL (ref 65–99)

## 2016-01-01 MED ORDER — GABAPENTIN 100 MG PO CAPS
100.0000 mg | ORAL_CAPSULE | Freq: Every morning | ORAL | Status: DC
  Administered 2016-01-01 – 2016-01-05 (×5): 100 mg via ORAL
  Filled 2016-01-01 (×5): qty 1

## 2016-01-01 MED ORDER — GABAPENTIN 100 MG PO CAPS
200.0000 mg | ORAL_CAPSULE | Freq: Every day | ORAL | Status: DC
  Administered 2016-01-01 – 2016-01-04 (×4): 200 mg via ORAL
  Filled 2016-01-01 (×5): qty 2

## 2016-01-01 MED ORDER — IBUPROFEN 400 MG PO TABS
400.0000 mg | ORAL_TABLET | Freq: Three times a day (TID) | ORAL | Status: DC | PRN
  Administered 2016-01-01 – 2016-01-02 (×2): 400 mg via ORAL
  Filled 2016-01-01 (×2): qty 1

## 2016-01-01 NOTE — Care Management Important Message (Signed)
Important Message  Patient Details  Name: Sandy [name] MRN: [id number] Date of Birth: November 09, 1945   Medicare Important Message Given:  Yes    Shelbie Ammons, RN 01/01/2016, 8:35 AM

## 2016-01-01 NOTE — Progress Notes (Addendum)
Visit made. Patient is currently followed by [facility] receiving Skilled nursing and Social work services. She has a diagnosis of metastatic colon Cancer. She was admitted to [facility] on 9/19 for treatment of neutropenic fever and altered mental status. She is currently receiving IV antibiotics and IV fluids. She is a DNR code at home, currently a Full Code this admission. Hospital Care team aware of services provided. [facility] team updated to admission. Patient seen lying in bed, appears pale, denied pain. Her daughter [name] was not present during visit, but Probation officer did speak with her briefly prior to visit in the hallway. Oncology consult pending, chart notes reviewed. WBC 0.5 on admission, up to 1.7 at last draw. Per chart note review last chemo treatment was 9/13. Will continue to follow and update [facility] team. Flo Shanks RN, BSN, [place] [facility] Liaison [phone] c

## 2016-01-01 NOTE — Progress Notes (Signed)
Patient ID: Sandy [name], female   DOB: 1946-01-17, 70 y.o.   MRN: [id number]  [place] PROGRESS NOTE  Sandy [name] [id number] DOB: 1945/05/01 DOA: 12/31/2015 PCP: Sandy Sia, MD  HPI/Subjective: Patient feels tired and lethargic. Was admitted with neutropenic fever. Had diarrhea prior to coming in and was given Imodium as outpatient  Objective: Vitals:   01/01/16 1115 01/01/16 1320  BP:  128/62  Pulse:  66  Resp:  18  Temp: 98.9 F (37.2 C) 98.8 F (37.1 C)    Filed Weights   12/31/15 1743 12/31/15 2258 01/01/16 0445  Weight: 67.7 kg (149 lb 4.8 oz) 68.3 kg (150 lb 8 oz) 68.9 kg (152 lb)    ROS: Review of Systems  Constitutional: Negative for chills and fever.  Eyes: Negative for blurred vision.  Respiratory: Negative for cough and shortness of breath.   Cardiovascular: Negative for chest pain.  Gastrointestinal: Positive for diarrhea. Negative for abdominal pain, constipation, nausea and vomiting.  Genitourinary: Negative for dysuria.  Musculoskeletal: Negative for joint pain.  Neurological: Negative for dizziness and headaches.   Exam: Physical Exam  HENT:  Nose: No mucosal edema.  Mouth/Throat: No oropharyngeal exudate or posterior oropharyngeal edema.  Eyes: Conjunctivae, EOM and lids are normal. Pupils are equal, round, and reactive to light.  Neck: No JVD present. Carotid bruit is not present. No edema present. No thyroid mass and no thyromegaly present.  Cardiovascular: S1 normal and S2 normal.  Exam reveals no gallop.   No murmur heard. Pulses:      Dorsalis pedis pulses are 2+ on the right side, and 2+ on the left side.  Respiratory: No respiratory distress. She has no wheezes. She has no rhonchi. She has no rales.  GI: Soft. Bowel sounds are normal. There is no tenderness.  Musculoskeletal:       Right ankle: She exhibits swelling.       Left ankle: She exhibits swelling.  Lymphadenopathy:    She has no cervical adenopathy.   Neurological: She is alert. No cranial nerve deficit.  Skin: Skin is warm. No rash noted. Nails show no clubbing.  Psychiatric: She has a normal mood and affect.      Data Reviewed: Basic Metabolic Panel:  Recent Labs Lab 12/31/15 1749 01/01/16 0647  NA 138 142  K 3.8 3.9  CL 109 118*  CO2 23 19*  GLUCOSE 158* 97  BUN 8 7  CREATININE 1.00 0.80  CALCIUM 8.9 8.2*   Liver Function Tests:  Recent Labs Lab 12/31/15 1749  AST 16  ALT 9*  ALKPHOS 98  BILITOT 0.7  PROT 5.9*  ALBUMIN 2.6*    Recent Labs Lab 12/31/15 1905  AMMONIA <9*   CBC:  Recent Labs Lab 12/30/15 1144 12/31/15 1749 01/01/16 0647  WBC 1.1* 0.5* 1.7*  NEUTROABS 0.4* 0.1*  --   HGB 10.2* 8.7* 7.4*  HCT 30.5* 26.4* 22.1*  MCV 91.4 93.2 92.6  PLT 125* 67* PLATELET CLUMPS NOTED ON SMEAR, UNABLE TO ESTIMATE   Cardiac Enzymes:  Recent Labs Lab 01/01/16 0113 01/01/16 0647 01/01/16 1131  TROPONINI <0.03 <0.03 <0.03   BNP (last 3 results)  Recent Labs  10/18/15 0921  BNP 159.0*    Studies: Dg Chest 2 View  Result Date: 12/31/2015 CLINICAL DATA:  Fever since this morning. History of sickle cell anemia. EXAM: CHEST  2 VIEW COMPARISON:  Chest x-ray 11/09/2015 FINDINGS: The heart is mildly enlarged but stable. The power port is stable. The  tip is in the right atrium. The lungs are clear. No pleural effusion. The bony thorax is intact. IMPRESSION: Mild stable cardiac enlargement. No infiltrates, edema or effusions. Electronically Signed   By: Marijo Sanes M.D.   On: 12/31/2015 18:15   Ct Head Wo Contrast  Result Date: 12/31/2015 CLINICAL DATA:  Acute onset of altered mental status. Decreased white blood cell count. Diarrhea. Initial encounter. EXAM: CT HEAD WITHOUT CONTRAST TECHNIQUE: Contiguous axial images were obtained from the base of the skull through the vertex without intravenous contrast. COMPARISON:  CT of the head performed 11/06/2015, and MRI of the brain performed 11/09/2015  FINDINGS: Brain: No evidence of acute infarction, hemorrhage, hydrocephalus, extra-axial collection or mass lesion/mass effect. Prominence of the ventricles and sulci reflects mild cortical volume loss. Scattered periventricular and subcortical white matter change likely reflects small vessel ischemic microangiopathy. The brainstem and fourth ventricle are within normal limits. The basal ganglia are unremarkable in appearance. The cerebral hemispheres demonstrate grossly normal gray-white differentiation. No mass effect or midline shift is seen. Vascular: No hyperdense vessel or unexpected calcification. Skull: There is no evidence of fracture; visualized osseous structures are unremarkable in appearance. Sinuses/Orbits: The visualized portions of the orbits are within normal limits. There is opacification of the left mastoid air cells. The paranasal sinuses and right mastoid air cells are well-aerated. Other: No significant soft tissue abnormalities are seen. IMPRESSION: 1. No acute intracranial pathology seen on CT. 2. Mild cortical volume loss and scattered small vessel ischemic microangiopathy. 3. Opacification of the left mastoid air cells. Electronically Signed   By: Garald Balding M.D.   On: 12/31/2015 18:50    Scheduled Meds: . amiodarone  100 mg Oral Daily  . atorvastatin  40 mg Oral Daily  . ceFEPime (MAXIPIME) IV  2 g Intravenous Q12H  . insulin aspart  0-9 Units Subcutaneous TID WC   Continuous Infusions: . 0.9 % NaCl with KCl 20 mEq / L 75 mL/hr at 01/01/16 1346    Assessment/Plan:  1. Neutropenic fever. White count has come up a little bit. Still on aggressive antibiotics with Maxipime. Was given 1 dose of vancomycin in the ER. Still awaiting cultures to result. 2. Lactic acidosis. Stop metformin. 3. Diarrhea. Since patient had Imodium as outpatient may end up not having a bowel movement for a few days. Send stool studies if she has a bowel movement. 4. Acute encephalopathy. Seems  better today. 5. Type 2 diabetes mellitus on sliding scale 6. Metastatic colon cancer. Oncology follow-up 7. Arrhythmia history on amiodarone 8. Hyperlipidemia unspecified on atorvastatin 9. Neuropathy. Restart gabapentin  Code Status:     Code Status Orders        Start     Ordered   12/31/15 2300  Full code  Continuous     12/31/15 2259    Code Status History    Date Active Date Inactive Code Status Order ID Comments User Context   11/11/2015  8:03 AM 11/12/2015  8:20 PM DNR [id number]  Hillary Bow, MD Inpatient   11/08/2015  2:54 PM 11/08/2015  5:44 PM DNR [id number]  Awilda Bill, NP Inpatient   11/07/2015  6:17 PM 11/08/2015  2:54 PM Full Code [id number]  Vaughan Basta, MD Inpatient   08/28/2014  4:37 PM 08/29/2014  8:42 PM Full Code [id number]  Theodoro Grist, MD Inpatient    Advance Directive Documentation   Flowsheet Row Most Recent Value  Type of Advance Directive  Healthcare Power of [place]  Pre-existing out of  facility DNR order (yellow form or pink MOST form)  No data  "MOST" Form in Place?  No data      Disposition Plan: To be determined.  Consultants:  Oncology  Antibiotics:  Maxipime  Time spent: 25 minutes  [place], [place]

## 2016-01-02 ENCOUNTER — Inpatient Hospital Stay: Payer: Medicare Other

## 2016-01-02 DIAGNOSIS — I4891 Unspecified atrial fibrillation: Secondary | ICD-10-CM

## 2016-01-02 DIAGNOSIS — C189 Malignant neoplasm of colon, unspecified: Secondary | ICD-10-CM

## 2016-01-02 DIAGNOSIS — D709 Neutropenia, unspecified: Secondary | ICD-10-CM

## 2016-01-02 DIAGNOSIS — Z9221 Personal history of antineoplastic chemotherapy: Secondary | ICD-10-CM

## 2016-01-02 DIAGNOSIS — D6959 Other secondary thrombocytopenia: Secondary | ICD-10-CM

## 2016-01-02 DIAGNOSIS — R531 Weakness: Secondary | ICD-10-CM

## 2016-01-02 DIAGNOSIS — C799 Secondary malignant neoplasm of unspecified site: Secondary | ICD-10-CM

## 2016-01-02 DIAGNOSIS — E119 Type 2 diabetes mellitus without complications: Secondary | ICD-10-CM

## 2016-01-02 DIAGNOSIS — D6481 Anemia due to antineoplastic chemotherapy: Secondary | ICD-10-CM

## 2016-01-02 DIAGNOSIS — Z79899 Other long term (current) drug therapy: Secondary | ICD-10-CM

## 2016-01-02 DIAGNOSIS — Z794 Long term (current) use of insulin: Secondary | ICD-10-CM

## 2016-01-02 DIAGNOSIS — R197 Diarrhea, unspecified: Secondary | ICD-10-CM

## 2016-01-02 DIAGNOSIS — R509 Fever, unspecified: Secondary | ICD-10-CM

## 2016-01-02 DIAGNOSIS — R4182 Altered mental status, unspecified: Secondary | ICD-10-CM

## 2016-01-02 DIAGNOSIS — T451X5A Adverse effect of antineoplastic and immunosuppressive drugs, initial encounter: Secondary | ICD-10-CM

## 2016-01-02 LAB — URINE CULTURE: CULTURE: NO GROWTH

## 2016-01-02 LAB — CBC
HCT: 22.4 % — ABNORMAL LOW (ref 35.0–47.0)
HEMOGLOBIN: 7.4 g/dL — AB (ref 12.0–16.0)
MCH: 30.4 pg (ref 26.0–34.0)
MCHC: 32.9 g/dL (ref 32.0–36.0)
MCV: 92.5 fL (ref 80.0–100.0)
Platelets: 43 10*3/uL — ABNORMAL LOW (ref 150–440)
RBC: 2.42 MIL/uL — ABNORMAL LOW (ref 3.80–5.20)
RDW: 17.1 % — AB (ref 11.5–14.5)
WBC: 0.6 10*3/uL — CL (ref 3.6–11.0)

## 2016-01-02 LAB — C DIFFICILE QUICK SCREEN W PCR REFLEX
C DIFFICILE (CDIFF) INTERP: NOT DETECTED
C DIFFICILE (CDIFF) TOXIN: NEGATIVE
C Diff antigen: NEGATIVE

## 2016-01-02 LAB — GLUCOSE, CAPILLARY
GLUCOSE-CAPILLARY: 94 mg/dL (ref 65–99)
GLUCOSE-CAPILLARY: 177 mg/dL — AB (ref 65–99)
Glucose-Capillary: 160 mg/dL — ABNORMAL HIGH (ref 65–99)
Glucose-Capillary: 95 mg/dL (ref 65–99)
Glucose-Capillary: 112 mg/dL — ABNORMAL HIGH (ref 65–99)

## 2016-01-02 MED ORDER — TRAZODONE HCL 50 MG PO TABS: 25.0000 mg | ORAL_TABLET | Freq: Every evening | ORAL | Status: DC | PRN

## 2016-01-02 MED ORDER — FILGRASTIM 480 MCG/1.6ML IJ SOLN
480.0000 ug | Freq: Once | INTRAMUSCULAR | Status: AC
  Administered 2016-01-02: 480 ug via SUBCUTANEOUS
  Filled 2016-01-02: qty 1.6

## 2016-01-02 MED ORDER — DIGOXIN 0.25 MG/ML IJ SOLN
0.2500 mg | Freq: Once | INTRAMUSCULAR | Status: AC
  Administered 2016-01-02: 13:00:00 0.25 mg via INTRAVENOUS
  Filled 2016-01-02: qty 1

## 2016-01-02 MED ORDER — AMIODARONE HCL 200 MG PO TABS
400.0000 mg | ORAL_TABLET | Freq: Every day | ORAL | Status: DC
  Administered 2016-01-02 – 2016-01-05 (×4): 400 mg via ORAL
  Filled 2016-01-02 (×4): qty 2

## 2016-01-02 MED ORDER — AMIODARONE IV BOLUS ONLY 150 MG/100ML
150.0000 mg | Freq: Once | INTRAVENOUS | Status: AC
  Administered 2016-01-02: 150 mg via INTRAVENOUS
  Filled 2016-01-02: qty 100

## 2016-01-02 MED ORDER — METOPROLOL TARTRATE 25 MG PO TABS
25.0000 mg | ORAL_TABLET | Freq: Two times a day (BID) | ORAL | Status: DC
  Administered 2016-01-02 – 2016-01-05 (×6): 25 mg via ORAL
  Filled 2016-01-02 (×6): qty 1

## 2016-01-02 MED ORDER — SODIUM CHLORIDE 0.9 % IV SOLN
INTRAVENOUS | Status: DC
  Administered 2016-01-02: 14:00:00 via INTRAVENOUS

## 2016-01-02 NOTE — Progress Notes (Signed)
PT Cancellation Note  Patient Details Name: Sandy [name] MRN: [id number] DOB: 1946-01-26   Cancelled Treatment:    Reason Eval/Treat Not Completed: Medical issues which prohibited therapy (Consult received and chart reviewed.  Patient noted in new onset a-fib with resting HR 120-130s.  Will hold therapy at this time and re-attempt next date as medically appropriate.  RN informed/awrae.)   Reyes Ivan. Owens Shark, PT, DPT, NCS 01/02/16, 12:34 PM [phone]

## 2016-01-02 NOTE — Progress Notes (Signed)
Visit made. Patient seen lying in bed, appears with drawn, did not engage with Probation officer. Daughter [name] at bedside, visible upset. Writer attempted to provide emotional support.  Patient is being transferred to 2A telemetry d/t the onset of A-fib with a rapid ventricular rate. She has been seen by cardiology, received a dose of IV digoxin and a 150 mg IV bolus of amiodarone. She remains on IV antibiotics and IV fluids. White count 0.6. Urine cultures negative, blood cultures pending, c-diff negative. She remains a Full Code. Will continue to follow through final disposition. Thank you. Flo Shanks RN, BSN, [place] [facility] Liaison [phone] c

## 2016-01-02 NOTE — Progress Notes (Signed)
Digoxin given 12:49pm. HR 130's-140s A fib until 1311 switched to sinus tach 130. Dr. Leslye Peer made aware.

## 2016-01-02 NOTE — Progress Notes (Addendum)
Patient ID: Sandy [name], female   DOB: April 28, 1945, 70 y.o.   MRN: [id number]   [facility] PROGRESS NOTE  Sandy [name] [id number] DOB: 1946-01-24 DOA: 12/31/2015 PCP: [name], MD  HPI/Subjective: Called my nurse a little bit earlier about patient going into rapid atrial fibrillation. I pushed 1 dose of IV digoxin. Patient does not feel any palpitations. She just feels a little bit weak.  Objective: Vitals:   01/02/16 1248 01/02/16 1256  BP: 136/84 (!) 145/79  Pulse:    Resp:    Temp:      Filed Weights   12/31/15 2258 01/01/16 0445 01/02/16 0500  Weight: 68.3 kg (150 lb 8 oz) 68.9 kg (152 lb) 70.5 kg (155 lb 6.4 oz)    ROS: Review of Systems  Constitutional: Positive for malaise/fatigue. Negative for chills and fever.  Eyes: Negative for blurred vision.  Respiratory: Negative for cough and shortness of breath.   Cardiovascular: Negative for chest pain.  Gastrointestinal: Positive for diarrhea. Negative for abdominal pain, constipation, nausea and vomiting.  Genitourinary: Negative for dysuria.  Musculoskeletal: Negative for joint pain.  Neurological: Positive for weakness. Negative for dizziness and headaches.   Exam: Physical Exam  HENT:  Nose: No mucosal edema.  Mouth/Throat: No oropharyngeal exudate or posterior oropharyngeal edema.  Eyes: Conjunctivae, EOM and lids are normal. Pupils are equal, round, and reactive to light.  Neck: No JVD present. Carotid bruit is not present. No edema present. No thyroid mass and no thyromegaly present.  Cardiovascular: S1 normal and S2 normal.  An irregularly irregular rhythm present. Tachycardia present.  Exam reveals no gallop.   Murmur heard.  Systolic murmur is present with a grade of 2/6  Pulses:      Dorsalis pedis pulses are 2+ on the right side, and 2+ on the left side.  Respiratory: No respiratory distress. She has decreased breath sounds in the right middle field, the right lower field, the  left middle field and the left lower field. She has no wheezes. She has no rhonchi. She has no rales.  GI: Soft. Bowel sounds are normal. There is no tenderness.  Musculoskeletal:       Right ankle: She exhibits swelling.       Left ankle: She exhibits swelling.  Lymphadenopathy:    She has no cervical adenopathy.  Neurological: She is alert. No cranial nerve deficit.  Skin: Skin is warm. No rash noted. Nails show no clubbing.  Psychiatric: She has a normal mood and affect.      Data Reviewed: Basic Metabolic Panel:  Recent Labs Lab 12/31/15 1749 01/01/16 0647  NA 138 142  K 3.8 3.9  CL 109 118*  CO2 23 19*  GLUCOSE 158* 97  BUN 8 7  CREATININE 1.00 0.80  CALCIUM 8.9 8.2*   Liver Function Tests:  Recent Labs Lab 12/31/15 1749  AST 16  ALT 9*  ALKPHOS 98  BILITOT 0.7  PROT 5.9*  ALBUMIN 2.6*    Recent Labs Lab 12/31/15 1905  AMMONIA <9*   CBC:  Recent Labs Lab 12/30/15 1144 12/31/15 1749 01/01/16 0647 01/02/16 0749  WBC 1.1* 0.5* 1.7* 0.6*  NEUTROABS 0.4* 0.1*  --   --   HGB 10.2* 8.7* 7.4* 7.4*  HCT 30.5* 26.4* 22.1* 22.4*  MCV 91.4 93.2 92.6 92.5  PLT 125* 67* PLATELET CLUMPS NOTED ON SMEAR, UNABLE TO ESTIMATE 43*   Cardiac Enzymes:  Recent Labs Lab 01/01/16 0113 01/01/16 0647 01/01/16 1131  TROPONINI <0.03 <  0.03 <0.03   BNP (last 3 results)  Recent Labs  10/18/15 0921  BNP 159.0*    Studies: Dg Chest 2 View  Result Date: 12/31/2015 CLINICAL DATA:  Fever since this morning. History of sickle cell anemia. EXAM: CHEST  2 VIEW COMPARISON:  Chest x-ray 11/09/2015 FINDINGS: The heart is mildly enlarged but stable. The power port is stable. The tip is in the right atrium. The lungs are clear. No pleural effusion. The bony thorax is intact. IMPRESSION: Mild stable cardiac enlargement. No infiltrates, edema or effusions. Electronically Signed   By: Marijo Sanes M.D.   On: 12/31/2015 18:15   Ct Head Wo Contrast  Result Date:  12/31/2015 CLINICAL DATA:  Acute onset of altered mental status. Decreased white blood cell count. Diarrhea. Initial encounter. EXAM: CT HEAD WITHOUT CONTRAST TECHNIQUE: Contiguous axial images were obtained from the base of the skull through the vertex without intravenous contrast. COMPARISON:  CT of the head performed 11/06/2015, and MRI of the brain performed 11/09/2015 FINDINGS: Brain: No evidence of acute infarction, hemorrhage, hydrocephalus, extra-axial collection or mass lesion/mass effect. Prominence of the ventricles and sulci reflects mild cortical volume loss. Scattered periventricular and subcortical white matter change likely reflects small vessel ischemic microangiopathy. The brainstem and fourth ventricle are within normal limits. The basal ganglia are unremarkable in appearance. The cerebral hemispheres demonstrate grossly normal gray-white differentiation. No mass effect or midline shift is seen. Vascular: No hyperdense vessel or unexpected calcification. Skull: There is no evidence of fracture; visualized osseous structures are unremarkable in appearance. Sinuses/Orbits: The visualized portions of the orbits are within normal limits. There is opacification of the left mastoid air cells. The paranasal sinuses and right mastoid air cells are well-aerated. Other: No significant soft tissue abnormalities are seen. IMPRESSION: 1. No acute intracranial pathology seen on CT. 2. Mild cortical volume loss and scattered small vessel ischemic microangiopathy. 3. Opacification of the left mastoid air cells. Electronically Signed   By: Garald Balding M.D.   On: 12/31/2015 18:50    Scheduled Meds: . amiodarone  100 mg Oral Daily  . atorvastatin  40 mg Oral Daily  . ceFEPime (MAXIPIME) IV  2 g Intravenous Q12H  . filgrastim  480 mcg Subcutaneous Once  . gabapentin  100 mg Oral q morning - 10a   And  . gabapentin  200 mg Oral QHS  . insulin aspart  0-9 Units Subcutaneous TID WC     Assessment/Plan:  1. Rapid atrial fibrillation. I gave 1 dose of IV digoxin. Case discussed with Dr. Nehemiah Massed cardiology and we decided to give amiodarone 150 mg IV bolus and increase oral amiodarone to 400 mg a day. Transfer to telemetry.  2. Neutropenic fever. White count Is still low. Give 1 dose of Neulasta today Still on aggressive antibiotics with Maxipime. Was given 1 dose of vancomycin in the ER. Still awaiting blood cultures 2 result. Urine culture negative. Stool studies sent off. Repeat chest x-ray. 3. Lactic acidosis. Stop metformin. 4. Diarrhea. Stool studies sent off. 5. Acute encephalopathy. Seems better today. 6. Type 2 diabetes mellitus on sliding scale 7. Metastatic colon cancer. Oncology follow-up 8. Hyperlipidemia unspecified on atorvastatin 9. Neuropathy. Restart gabapentin  Code Status:     Code Status Orders        Start     Ordered   12/31/15 2300  Full code  Continuous     12/31/15 2259    Code Status History    Date Active Date Inactive Code Status Order  ID Comments User Context   11/11/2015  8:03 AM 11/12/2015  8:20 PM DNR [id number]  Hillary Bow, MD Inpatient   11/08/2015  2:54 PM 11/08/2015  5:44 PM DNR [id number]  Awilda Bill, NP Inpatient   11/07/2015  6:17 PM 11/08/2015  2:54 PM Full Code [id number]  Vaughan Basta, MD Inpatient   08/28/2014  4:37 PM 08/29/2014  8:42 PM Full Code [id number]  Theodoro Grist, MD Inpatient    Advance Directive Documentation   Flowsheet Row Most Recent Value  Type of Advance Directive  Healthcare Power of Attorney  Pre-existing out of facility DNR order (yellow form or pink MOST form)  No data  "MOST" Form in Place?  No data      Disposition Plan: To be determined.Case discussed with daughter on the phone. She confirmed full CODE STATUS.  Consultants:  Oncology  Antibiotics:  Maxipime  Time spent: 32 minutes. Patient to be transferred to telemetry for further monitoring of Heart rate.  Loletha Grayer  [employer]

## 2016-01-02 NOTE — Consult Note (Signed)
[name] Consultation Note  Patient ID: Sandy [name], MRN: [id number], DOB/AGE: [birth year] 51 y.o. Admit date: 12/31/2015   Date of Consult: 01/02/2016 Primary Physician: Sandy Sia, MD Primary Cardiologist: Sandy [name]  Chief Complaint:  Chief Complaint  Patient presents with  . Altered Mental Status  . Abnormal Lab   Reason for Consult: atrial fibrillation  HPI: 70 y.o. female with known paroxysmal nonvalvular atrial fibrillation well controlled on medication management including low-dose amiodarone. The patient has had some mild valvular heart disease but normal LV function previously by echocardiogram. The patient has not been on anticoagulation at this time due to recent cancer diagnosis and concerns of anemia as well as significant concerns of side effects of medication. Recently the patient has had worsening infection weakness and fatigue and was admitted for further supportive care. At that point the patient has had new onset of recurrent paroxysmal nonvalvular atrial fibrillation with rapid ventricular rate with symptoms of shortness of breath. The patient has had somewhat controlled the heart rate without medication management but will need further medication management to spontaneously convert to normal sinus rhythm. Currently the patient has weakness and fatigue  Past Medical History:  Diagnosis Date  . Cancer ([place])    liver mets; colon cancer  . Constipation   . Diabetes mellitus without complication ([place])   . HOH (hard of hearing)   . Hypertension   . Insomnia   . 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 Sickle cell anemia ([facility])   . Wears dentures    partial upper and lower      Surgical History:  Past Surgical History:  Procedure Laterality Date  . CARDIAC CATHETERIZATION    . COLONOSCOPY WITH PROPOFOL N/A 09/07/2014   Procedure: COLONOSCOPY WITH PROPOFOL;  Surgeon: Sandy Lame, MD;  Location: [facility] ENDOSCOPY;  Service:  Endoscopy;  Laterality: N/A;  . ESOPHAGOGASTRODUODENOSCOPY N/A 09/07/2014   Procedure: ESOPHAGOGASTRODUODENOSCOPY (EGD);  Surgeon: Sandy Lame, MD;  Location: [facility] ENDOSCOPY;  Service: Endoscopy;  Laterality: N/A;  . FLEXIBLE SIGMOIDOSCOPY N/A 09/02/2015   Procedure: FLEXIBLE SIGMOIDOSCOPY;  Surgeon: Sandy Lame, MD;  Location: [place];  Service: Endoscopy;  Laterality: N/A;  Diabetic - oral meds Pt has port-a-cath  . LIVER BIOPSY    . PORTACATH PLACEMENT Left 09/24/2014   Procedure: INSERTION PORT-A-CATH;  Surgeon: Sandy Bellow, MD;  Location: [facility] ORS;  Service: General;  Laterality: Left;     Home Meds: Prior to Admission medications   Medication Sig Start Date End Date Taking? Authorizing Provider  amiodarone (PACERONE) 100 MG tablet Take 100 mg by mouth daily.   Yes Historical Provider, MD  aspirin EC 81 MG tablet Take 81 mg by mouth daily. Reported on 08/26/2015   Yes Historical Provider, MD  atorvastatin (LIPITOR) 40 MG tablet Take 40 mg by mouth daily.   Yes Historical Provider, MD  diphenoxylate-atropine (LOMOTIL) 2.5-0.025 MG tablet 1-2 tablets every 6 hours as needed for diarrhea 12/26/15  Yes Sandy Asal, MD  gabapentin (NEURONTIN) 100 MG capsule Take 2 capsules (200 mg total) by mouth at bedtime. 1 tab daily and 2 tabs at bedtime. Patient taking differently: Take 200 mg by mouth at bedtime. 1 tab in the morning daily and 2 capsules at bedtime. 07/16/15  Yes Sandy Asal, MD  lisinopril (PRINIVIL,ZESTRIL) 10 MG tablet Take 10 mg by mouth daily.   Yes Historical Provider, MD  magnesium 30 MG tablet Take 30 mg by mouth  daily.    Yes Historical Provider, MD  megestrol (MEGACE) 400 MG/10ML suspension Take 5 mLs (200 mg total) by mouth daily. to stimulate appetite 11/06/14  Yes Sandy Asal, MD  metFORMIN (GLUCOPHAGE) 1000 MG tablet Take 1,000 mg by mouth 2 (two) times daily.    Yes Historical Provider, MD  Multiple Vitamin (MULTIVITAMIN WITH MINERALS) TABS  tablet Take 1 tablet by mouth daily.   Yes Historical Provider, MD  ondansetron (ZOFRAN) 4 MG tablet Take 1 tablet (4 mg total) by mouth every 8 (eight) hours as needed for nausea or vomiting. 12/19/15  Yes Sandy Asal, MD  oxyCODONE (OXY IR/ROXICODONE) 5 MG immediate release tablet Take 1 tablet (5 mg total) by mouth every 6 (six) hours as needed for severe pain. 12/19/15  Yes Sandy Asal, MD  traZODone (DESYREL) 50 MG tablet Take 0.5 tablets (25 mg total) by mouth at bedtime as needed for sleep. 10/22/15  Yes Sandy Basta, MD  docusate sodium (COLACE) 100 MG capsule Take 1 capsule (100 mg total) by mouth 2 (two) times daily. Patient not taking: Reported on 12/31/2015 10/22/15   Sandy Basta, MD  feeding supplement, ENSURE ENLIVE, (ENSURE ENLIVE) LIQD Take 237 mLs by mouth 2 (two) times daily between meals. Patient not taking: Reported on 12/31/2015 10/22/15   Sandy Basta, MD  lidocaine-prilocaine (EMLA) cream Apply 1 application topically as needed. Apply to port 1 hour prior to chemotherapy appointment. Cover with plastic wrap. 07/08/15   Sandy Asal, MD    Inpatient Medications:  . amiodarone  150 mg Intravenous Once  . amiodarone  400 mg Oral Daily  . atorvastatin  40 mg Oral Daily  . ceFEPime (MAXIPIME) IV  2 g Intravenous Q12H  . filgrastim  480 mcg Subcutaneous Once  . gabapentin  100 mg Oral q morning - 10a   And  . gabapentin  200 mg Oral QHS  . insulin aspart  0-9 Units Subcutaneous TID WC   . sodium chloride      Allergies:  Allergies  Allergen Reactions  . Tylenol [Acetaminophen] Nausea And Vomiting    Social History   Social History  . Marital status: Widowed    Spouse name: N/A  . Number of children: N/A  . Years of education: N/A   Occupational History  . Not on file.   Social History Main Topics  . Smoking status: Former Smoker    Packs/day: 0.25    Years: 20.00    Types: Cigarettes    Quit date: 09/06/2013  .  Smokeless tobacco: Never Used  . Alcohol use No  . Drug use: No  . Sexual activity: No   Other Topics Concern  . Not on file   Social History Narrative  . No narrative on file     Family History  Problem Relation Age of Onset  . Cancer Sister      Review of Systems Positive for Weakness fatigue fevers or chills Negative for: General:  Positive for chills, fever, night sweats or weight changes.  Cardiovascular: PND orthopnea syncope dizziness  Dermatological skin lesions rashes Respiratory: Cough congestion Urologic: Frequent urination urination at night and hematuria Abdominal: negative for nausea, vomiting, diarrhea, bright red blood per rectum, melena, or hematemesis Neurologic: negative for visual changes, and/or hearing changes  All other systems reviewed and are otherwise negative except as noted above.  Labs:  Recent Labs  01/01/16 0113 01/01/16 0647 01/01/16 1131  TROPONINI <0.03 <0.03 <0.03   Lab Results  Component Value Date   WBC 0.6 (LL) 01/02/2016   HGB 7.4 (L) 01/02/2016   HCT 22.4 (L) 01/02/2016   MCV 92.5 01/02/2016   PLT 43 (L) 01/02/2016    Recent Labs Lab 12/31/15 1749 01/01/16 0647  NA 138 142  K 3.8 3.9  CL 109 118*  CO2 23 19*  BUN 8 7  CREATININE 1.00 0.80  CALCIUM 8.9 8.2*  PROT 5.9*  --   BILITOT 0.7  --   ALKPHOS 98  --   ALT 9*  --   AST 16  --   GLUCOSE 158* 97   No results found for: CHOL, HDL, LDLCALC, TRIG No results found for: DDIMER  Radiology/Studies:  Dg Chest 2 View  Result Date: 12/31/2015 CLINICAL DATA:  Fever since this morning. History of sickle cell anemia. EXAM: CHEST  2 VIEW COMPARISON:  Chest x-ray 11/09/2015 FINDINGS: The heart is mildly enlarged but stable. The power port is stable. The tip is in the right atrium. The lungs are clear. No pleural effusion. The bony thorax is intact. IMPRESSION: Mild stable cardiac enlargement. No infiltrates, edema or effusions. Electronically Signed   By: Marijo Sanes  M.D.   On: 12/31/2015 18:15   Ct Head Wo Contrast  Result Date: 12/31/2015 CLINICAL DATA:  Acute onset of altered mental status. Decreased white blood cell count. Diarrhea. Initial encounter. EXAM: CT HEAD WITHOUT CONTRAST TECHNIQUE: Contiguous axial images were obtained from the base of the skull through the vertex without intravenous contrast. COMPARISON:  CT of the head performed 11/06/2015, and MRI of the brain performed 11/09/2015 FINDINGS: Brain: No evidence of acute infarction, hemorrhage, hydrocephalus, extra-axial collection or mass lesion/mass effect. Prominence of the ventricles and sulci reflects mild cortical volume loss. Scattered periventricular and subcortical white matter change likely reflects small vessel ischemic microangiopathy. The brainstem and fourth ventricle are within normal limits. The basal ganglia are unremarkable in appearance. The cerebral hemispheres demonstrate grossly normal gray-white differentiation. No mass effect or midline shift is seen. Vascular: No hyperdense vessel or unexpected calcification. Skull: There is no evidence of fracture; visualized osseous structures are unremarkable in appearance. Sinuses/Orbits: The visualized portions of the orbits are within normal limits. There is opacification of the left mastoid air cells. The paranasal sinuses and right mastoid air cells are well-aerated. Other: No significant soft tissue abnormalities are seen. IMPRESSION: 1. No acute intracranial pathology seen on CT. 2. Mild cortical volume loss and scattered small vessel ischemic microangiopathy. 3. Opacification of the left mastoid air cells. Electronically Signed   By: Garald Balding M.D.   On: 12/31/2015 18:50    EKG: Atrial fibrillation with rapid ventricular rate  Weights: Filed Weights   12/31/15 2258 01/01/16 0445 01/02/16 0500  Weight: 150 lb 8 oz (68.3 kg) 152 lb (68.9 kg) 155 lb 6.4 oz (70.5 kg)     Physical Exam: Blood pressure (!) 145/79, pulse 91,  temperature 99.3 F (37.4 C), temperature source Oral, resp. rate 16, height 5\' 6"  (1.676 m), weight 155 lb 6.4 oz (70.5 kg), SpO2 100 %. Body mass index is 25.08 kg/m. General: Well developed, well nourished, in no acute distress. Head eyes ears nose throat: Normocephalic, atraumatic, sclera non-icteric, no xanthomas, nares are without discharge. No apparent thyromegaly and/or mass  Lungs: Normal respiratory effort.  no wheezes, no rales, no rhonchi.  Heart: Irregular with normal S1 S2. no murmur gallop, no rub, PMI is normal size and placement, carotid upstroke normal without bruit, jugular venous pressure is normal  Abdomen:  non-tender, non-distended with normoactive bowel sounds. No hepatomegaly. No rebound/guarding. No obvious abdominal masses. Abdominal aorta is normal size without bruit Extremities: Trace edema. no cyanosis, no clubbing, no ulcers  Peripheral : 2+ bilateral upper extremity pulses, 2+ bilateral femoral pulses, 2+ bilateral dorsal pedal pulse Neuro: Alert and oriented. No facial asymmetry. No focal deficit. Moves all extremities spontaneously. Musculoskeletal: Normal muscle tone without kyphosis Psych:  Responds to questions appropriately with a normal affect.    Assessment: 70 year old female with metastatic colon cancer essential hypertension mixed hyperlipidemia and paroxysmal nonvalvular atrial fibrillation with rapid ventricular rate  Plan: 1. Increase amiodarone orally for better heart rate control and maintenance of normal sinus rhythm 2. Bolus of amiodarone 150 mg 4 possible heart rate control and spontaneously conversion to normal sinus rhythm 3. No other cardiovascular intervention at this time due to no evidence of congestive heart failure or true angina 4. Continue supportive care of infection and cancer treatment 5. Further treatment options after control of heart rate listed above and further consideration of anticoagulation if patient does not have a early  electrical cardioversion to normal rhythm  Signed, Corey Skains M.D. [name] 01/02/2016, 1:32 PM

## 2016-01-02 NOTE — Progress Notes (Signed)
Patient transferred to 2A room 231. Report given to [place].

## 2016-01-02 NOTE — Progress Notes (Addendum)
Dr. Leslye Peer notified patient changed from NSR to A fib at 12:11pm but was just notified by CCMD at 12:32pm.   HR maintaining 120's-140's A fib. Verbal order read back and verified for Digoxin 0.25mg  once now. Will continue to monitor closely.

## 2016-01-02 NOTE — Progress Notes (Signed)
[facility] Hematology/Oncology Progress Note  Date of admission: 12/31/2015  Hospital day:  01/02/16  Chief Complaint: Sandy [name] is a 71 y.o. female with stage IV colon cancer who was admitted with altered mental status, fever and neutropenia.  Subjective:  Patient does not remember events prior to admission.  Diarrhea minimal.  Feeling better today.  Still weak.  Social History: The patient is alone today.  Allergies:  Allergies  Allergen Reactions  . Tylenol [Acetaminophen] Nausea And Vomiting    Scheduled Medications: . amiodarone  100 mg Oral Daily  . atorvastatin  40 mg Oral Daily  . ceFEPime (MAXIPIME) IV  2 g Intravenous Q12H  . gabapentin  100 mg Oral q morning - 10a   And  . gabapentin  200 mg Oral QHS  . insulin aspart  0-9 Units Subcutaneous TID WC    Review of Systems: GENERAL: Feeling better.  Fatigue. Low grade temperature this AM.No sweats. PERFORMANCE STATUS (ECOG): 2. HEENT: No visual changes, runny nose, sore throat, mouth sores or tenderness. Lungs: No shortness of breath or cough. No hemoptysis. Cardiac: No chest pain, palpitations, orthopnea, or PND. GI: Little diarrhea.  Intermittent abdominal cramping.  No nausea, vomiting, diarrhea, melena or hematochezia. GU: No dysuria or hematuria. Musculoskeletal: Denies back pain. No joint pain. No muscle tenderness. Extremities: No pain or swelling. 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 No headache, numbness or weakness, or balance issues. Endocrine: Diabetes. No thyroid issues, hot flashes or night sweats. Psych: No mood changes or anxiety. Pain: Denies pain. Review of systems: All other systems reviewed and found to be negative.  Physical Exam: Blood pressure 139/77, pulse 91, temperature 99.3 F (37.4 C), temperature source Oral, resp. rate 16, height 5' 6"  (1.676 m), weight 155 lb 6.4 oz (70.5  kg), SpO2 100 %.  GENERAL: Elderly woman resting comfortably on the medical unit in no acute distress. MENTAL STATUS: Alert and oriented to person, place and time. HEAD: Wearing a cap.  Alopecia.  Normocephalic, atraumatic, face symmetric, no Cushingoid features. EYES: Hazel/green eyes. Pupils equal round and reactive to light and accomodation. No conjunctivitis or scleral icterus. ENT: Oropharynx clear without lesion. Missing several teeth. Tongue normal. Mucous membranes moist.  RESPIRATORY: Decreased respiratory excursion.  Clear to auscultation without rales, wheezes or rhonchi. CARDIOVASCULAR: Regular rate and rhythm without murmur, rub or gallop. ABDOMEN: Soft, minimally tender mid lower quadrant.  Active bowel sounds. No guarding or rebound tenderness. No hepatosplenomegaly. SKIN: No rashes, ulcer or skin lesions.  EXTREMITIES: Trace edema.  No skin discoloration or tenderness. No palpable cords. LYMPH NODES: Right low anterior cervical 2-3 cm soft mass (thyroid). No palpable supraclavicular, axillary or inguinal adenopathy  NEUROLOGICAL: Appropriate.  Conversant. PSYCH: Affect normal.   Results for orders placed or performed during the hospital encounter of 12/31/15 (from the past 48 hour(s))  Comprehensive metabolic panel     Status: Abnormal   Collection Time: 12/31/15  5:49 PM  Result Value Ref Range   Sodium 138 135 - 145 mmol/L   Potassium 3.8 3.5 - 5.1 mmol/L   Chloride 109 101 - 111 mmol/L   CO2 23 22 - 32 mmol/L   Glucose, Bld 158 (H) 65 - 99 mg/dL   BUN 8 6 - 20 mg/dL   Creatinine, Ser 1.00 0.44 - 1.00 mg/dL   Calcium 8.9 8.9 - 10.3 mg/dL   Total Protein 5.9 (L) 6.5 - 8.1 g/dL  Albumin 2.6 (L) 3.5 - 5.0 g/dL   AST 16 15 - 41 U/L   ALT 9 (L) 14 - 54 U/L   Alkaline Phosphatase 98 38 - 126 U/L   Total Bilirubin 0.7 0.3 - 1.2 mg/dL   GFR calc non Af Amer 55 (L) >60 mL/min   GFR calc Af Amer >60 >60 mL/min    Comment: (NOTE) The eGFR has been  calculated using the CKD EPI equation. This calculation has not been validated in all clinical situations. eGFR's persistently <60 mL/min signify possible Chronic Kidney Disease.    Anion gap 6 5 - 15  CBC with Differential     Status: Abnormal   Collection Time: 12/31/15  5:49 PM  Result Value Ref Range   WBC 0.5 (LL) 3.6 - 11.0 K/uL    Comment: CRITICAL RESULT CALLED TO, READ BACK BY AND VERIFIED WITH: ANNA HOLT AT 1859 12/31/2015 BY TFK    RBC 2.83 (L) 3.80 - 5.20 MIL/uL   Hemoglobin 8.7 (L) 12.0 - 16.0 g/dL   HCT 26.4 (L) 35.0 - 47.0 %   MCV 93.2 80.0 - 100.0 fL   MCH 30.7 26.0 - 34.0 pg   MCHC 32.9 32.0 - 36.0 g/dL   RDW 17.2 (H) 11.5 - 14.5 %   Platelets 67 (L) 150 - 440 K/uL   Neutrophils Relative % 17 %   Lymphocytes Relative 74 %   Monocytes Relative 4 %   Eosinophils Relative 4 %   Basophils Relative 1 %   Neutro Abs 0.1 (L) 1.4 - 6.5 K/uL   Lymphs Abs 0.4 (L) 1.0 - 3.6 K/uL   Monocytes Absolute 0.0 (L) 0.2 - 0.9 K/uL   Eosinophils Absolute 0.0 0 - 0.7 K/uL   Basophils Absolute 0.0 0 - 0.1 K/uL  Lactic acid, plasma     Status: Abnormal   Collection Time: 12/31/15  5:52 PM  Result Value Ref Range   Lactic Acid, Venous 2.3 (HH) 0.5 - 1.9 mmol/L    Comment: CRITICAL RESULT CALLED TO, READ BACK BY AND VERIFIED WITH ANNA HOLT AT 1845 ON 12/31/15 BY JUW   Urinalysis complete, with microscopic     Status: Abnormal   Collection Time: 12/31/15  6:24 PM  Result Value Ref Range   Color, Urine YELLOW (A) YELLOW   APPearance CLEAR (A) CLEAR   Glucose, UA NEGATIVE NEGATIVE mg/dL   Bilirubin Urine NEGATIVE NEGATIVE   Ketones, ur NEGATIVE NEGATIVE mg/dL   Specific Gravity, Urine 1.012 1.005 - 1.030   Hgb urine dipstick NEGATIVE NEGATIVE   pH 5.0 5.0 - 8.0   Protein, ur NEGATIVE NEGATIVE mg/dL   Nitrite NEGATIVE NEGATIVE   Leukocytes, UA NEGATIVE NEGATIVE   RBC / HPF 0-5 0 - 5 RBC/hpf   WBC, UA 0-5 0 - 5 WBC/hpf   Bacteria, UA NONE SEEN NONE SEEN   Squamous Epithelial /  LPF NONE SEEN NONE SEEN   Mucous PRESENT   Urine culture     Status: None   Collection Time: 12/31/15  6:24 PM  Result Value Ref Range   Specimen Description URINE, RANDOM    Special Requests NONE    Culture NO GROWTH Performed at [facility]     Report Status 01/02/2016 FINAL   Ammonia     Status: Abnormal   Collection Time: 12/31/15  7:05 PM  Result Value Ref Range   Ammonia <9 (L) 9 - 35 umol/L  Lactic acid, plasma     Status: None  Collection Time: 12/31/15  8:47 PM  Result Value Ref Range   Lactic Acid, Venous 1.6 0.5 - 1.9 mmol/L  Troponin I     Status: None   Collection Time: 01/01/16  1:13 AM  Result Value Ref Range   Troponin I <0.03 <0.03 ng/mL  Basic metabolic panel     Status: Abnormal   Collection Time: 01/01/16  6:47 AM  Result Value Ref Range   Sodium 142 135 - 145 mmol/L   Potassium 3.9 3.5 - 5.1 mmol/L   Chloride 118 (H) 101 - 111 mmol/L   CO2 19 (L) 22 - 32 mmol/L   Glucose, Bld 97 65 - 99 mg/dL   BUN 7 6 - 20 mg/dL   Creatinine, Ser 0.80 0.44 - 1.00 mg/dL   Calcium 8.2 (L) 8.9 - 10.3 mg/dL   GFR calc non Af Amer >60 >60 mL/min   GFR calc Af Amer >60 >60 mL/min    Comment: (NOTE) The eGFR has been calculated using the CKD EPI equation. This calculation has not been validated in all clinical situations. eGFR's persistently <60 mL/min signify possible Chronic Kidney Disease.    Anion gap 5 5 - 15  CBC     Status: Abnormal   Collection Time: 01/01/16  6:47 AM  Result Value Ref Range   WBC 1.7 (L) 3.6 - 11.0 K/uL   RBC 2.39 (L) 3.80 - 5.20 MIL/uL   Hemoglobin 7.4 (L) 12.0 - 16.0 g/dL   HCT 22.1 (L) 35.0 - 47.0 %   MCV 92.6 80.0 - 100.0 fL   MCH 30.9 26.0 - 34.0 pg   MCHC 33.3 32.0 - 36.0 g/dL   RDW 17.1 (H) 11.5 - 14.5 %   Platelets PLATELET CLUMPS NOTED ON SMEAR, UNABLE TO ESTIMATE 150 - 440 K/uL    Comment: SDR  Troponin I     Status: None   Collection Time: 01/01/16  6:47 AM  Result Value Ref Range   Troponin I <0.03 <0.03 ng/mL   Glucose, capillary     Status: None   Collection Time: 01/01/16  7:29 AM  Result Value Ref Range   Glucose-Capillary 96 65 - 99 mg/dL  Glucose, capillary     Status: Abnormal   Collection Time: 01/01/16 11:28 AM  Result Value Ref Range   Glucose-Capillary 124 (H) 65 - 99 mg/dL  Troponin I     Status: None   Collection Time: 01/01/16 11:31 AM  Result Value Ref Range   Troponin I <0.03 <0.03 ng/mL  Glucose, capillary     Status: None   Collection Time: 01/01/16  4:49 PM  Result Value Ref Range   Glucose-Capillary 76 65 - 99 mg/dL  Glucose, capillary     Status: Abnormal   Collection Time: 01/01/16  8:57 PM  Result Value Ref Range   Glucose-Capillary 160 (H) 65 - 99 mg/dL  Glucose, capillary     Status: None   Collection Time: 01/02/16  7:35 AM  Result Value Ref Range   Glucose-Capillary 94 65 - 99 mg/dL  CBC     Status: Abnormal   Collection Time: 01/02/16  7:49 AM  Result Value Ref Range   WBC 0.6 (LL) 3.6 - 11.0 K/uL    Comment: RESULT REPEATED AND VERIFIED CRITICAL RESULT CALLED TO, READ BACK BY AND VERIFIED WITH: SKYLAR STONE @ 0846 BY [facility]/JLJ    RBC 2.42 (L) 3.80 - 5.20 MIL/uL   Hemoglobin 7.4 (L) 12.0 - 16.0 g/dL   HCT 22.4 (  L) 35.0 - 47.0 %   MCV 92.5 80.0 - 100.0 fL   MCH 30.4 26.0 - 34.0 pg   MCHC 32.9 32.0 - 36.0 g/dL   RDW 17.1 (H) 11.5 - 14.5 %   Platelets 43 (L) 150 - 440 K/uL   Dg Chest 2 View  Result Date: 12/31/2015 CLINICAL DATA:  Fever since this morning. History of sickle cell anemia. EXAM: CHEST  2 VIEW COMPARISON:  Chest x-ray 11/09/2015 FINDINGS: The heart is mildly enlarged but stable. The power port is stable. The tip is in the right atrium. The lungs are clear. No pleural effusion. The bony thorax is intact. IMPRESSION: Mild stable cardiac enlargement. No infiltrates, edema or effusions. Electronically Signed   By: Marijo Sanes M.D.   On: 12/31/2015 18:15   Ct Head Wo Contrast  Result Date: 12/31/2015 CLINICAL DATA:  Acute onset of altered  mental status. Decreased white blood cell count. Diarrhea. Initial encounter. EXAM: CT HEAD WITHOUT CONTRAST TECHNIQUE: Contiguous axial images were obtained from the base of the skull through the vertex without intravenous contrast. COMPARISON:  CT of the head performed 11/06/2015, and MRI of the brain performed 11/09/2015 FINDINGS: Brain: No evidence of acute infarction, hemorrhage, hydrocephalus, extra-axial collection or mass lesion/mass effect. Prominence of the ventricles and sulci reflects mild cortical volume loss. Scattered periventricular and subcortical white matter change likely reflects small vessel ischemic microangiopathy. The brainstem and fourth ventricle are within normal limits. The basal ganglia are unremarkable in appearance. The cerebral hemispheres demonstrate grossly normal gray-white differentiation. No mass effect or midline shift is seen. Vascular: No hyperdense vessel or unexpected calcification. Skull: There is no evidence of fracture; visualized osseous structures are unremarkable in appearance. Sinuses/Orbits: The visualized portions of the orbits are within normal limits. There is opacification of the left mastoid air cells. The paranasal sinuses and right mastoid air cells are well-aerated. Other: No significant soft tissue abnormalities are seen. IMPRESSION: 1. No acute intracranial pathology seen on CT. 2. Mild cortical volume loss and scattered small vessel ischemic microangiopathy. 3. Opacification of the left mastoid air cells. Electronically Signed   By: Garald Balding M.D.   On: 12/31/2015 18:50    Assessment:  Sandy [name] is a 70 y.o. female with metastatic colon cancer currently day #11 s/p cycle #3 FOLFIRI (dose reduced) admitted with altered mental status, fever and neutropenia.  At initial presentation, patient was confused (does not remember events precipitating admission).  She was pan-cultured and started on broad spectrum antibiotics (cefepime and  vancomycin).  Cultures to-date are negative to date.  She is slowly returning back to her baseline mentation.  She experienced diarrhea with her chemotherapy (irinotecan).  Per patient, one loose stool today.  Stool studies pending.  Plan: 1.  Oncology:  Day 11 s/p cycle #3 FOLFIRI (dose reduced) followed by Neulasta post chemotherapy.  Despite dose reduction, patient developed recurrent neutropenia.  Check CBC with diff daily.  No benefit of added daily GCSF given full dose Neulasta at disconnect from pump on day 3.  Anticipate counts to improve soon.    Platelet count low secondary to chemotherapy induced myelosuppression and consumption from infection. No bleeding.  Monitor daily.  No Lovenox for DVT prophylaxis.  Hematocrit drifting down likely due to chemotherapy induced anemia.  Maintain active type and screen. May require transfusion.    2.  Infectious disease:  Patient with low grade fever this morning.  Cultures negative to date.  Currently on Cefepime.  Stool studies to  be sent.  3.  Cardiovascular:  Later today, patient developed atrial fibrillation with RVR.  Patient on amiodarone.  Dose increased per cardiology.  Patient moved from oncology floor to telemetry bed.     Lequita Asal, MD  01/02/2016, 10:31 AM

## 2016-01-02 NOTE — Progress Notes (Signed)
Spoke to Dr. Nehemiah Massed regarding patient's new rate and rhythm change from NSR in the 70's to A-Fib RVR in the 110's - 120's. See order for new medication. Will continue to monitor. Wenda Low [facility]

## 2016-01-02 NOTE — Progress Notes (Signed)
Dr. Leslye Peer notified in person of critical WBC 0.6. Verbalized understanding.

## 2016-01-03 DIAGNOSIS — C787 Secondary malignant neoplasm of liver and intrahepatic bile duct: Secondary | ICD-10-CM

## 2016-01-03 DIAGNOSIS — D649 Anemia, unspecified: Secondary | ICD-10-CM

## 2016-01-03 LAB — GLUCOSE, CAPILLARY
Glucose-Capillary: 98 mg/dL (ref 65–99)
Glucose-Capillary: 109 mg/dL — ABNORMAL HIGH (ref 65–99)
Glucose-Capillary: 131 mg/dL — ABNORMAL HIGH (ref 65–99)
Glucose-Capillary: 153 mg/dL — ABNORMAL HIGH (ref 65–99)

## 2016-01-03 LAB — CBC WITH DIFFERENTIAL/PLATELET
Band Neutrophils: 4 %
Basophils Absolute: 0 10*3/uL (ref 0–0.1)
Basophils Relative: 0 %
Blasts: 0 %
Eosinophils Absolute: 0 10*3/uL (ref 0–0.7)
Eosinophils Relative: 0 %
HCT: 22.5 % — ABNORMAL LOW (ref 35.0–47.0)
Hemoglobin: 7.4 g/dL — ABNORMAL LOW (ref 12.0–16.0)
Lymphocytes Relative: 39 %
Lymphs Abs: 0.4 10*3/uL — ABNORMAL LOW (ref 1.0–3.6)
MCH: 30.4 pg (ref 26.0–34.0)
MCHC: 33 g/dL (ref 32.0–36.0)
MCV: 92.2 fL (ref 80.0–100.0)
Metamyelocytes Relative: 0 %
Monocytes Absolute: 0.1 10*3/uL — ABNORMAL LOW (ref 0.2–0.9)
Monocytes Relative: 6 %
Myelocytes: 0 %
Neutro Abs: 0.5 10*3/uL — ABNORMAL LOW (ref 1.4–6.5)
Neutrophils Relative %: 51 %
Other: 0 %
Platelets: 49 10*3/uL — ABNORMAL LOW (ref 150–440)
Promyelocytes Absolute: 0 %
RBC: 2.44 MIL/uL — ABNORMAL LOW (ref 3.80–5.20)
RDW: 17.4 % — ABNORMAL HIGH (ref 11.5–14.5)
WBC: 1 10*3/uL — CL (ref 3.6–11.0)
nRBC: 0 /100 WBC

## 2016-01-03 LAB — TYPE AND SCREEN
ABO/RH(D): O POS
Antibody Screen: NEGATIVE

## 2016-01-03 MED ORDER — OXYCODONE HCL 5 MG PO TABS
5.0000 mg | ORAL_TABLET | Freq: Four times a day (QID) | ORAL | Status: DC | PRN
  Administered 2016-01-03 (×2): 5 mg via ORAL
  Filled 2016-01-03 (×2): qty 1

## 2016-01-03 MED ORDER — DEXTROSE 5 % IV SOLN
2.0000 g | Freq: Three times a day (TID) | INTRAVENOUS | Status: DC
  Administered 2016-01-03 – 2016-01-04 (×3): 2 g via INTRAVENOUS
  Filled 2016-01-03 (×5): qty 2

## 2016-01-03 NOTE — Progress Notes (Signed)
Visit made to current [place] patient. Sandy [name] seen sitting up in the chair, alert and quietly interactive. Staff RN Mia Creek present. Patient did report some left sided pain during visit. She takes oxycodone 5 mg q 6 hrs PRN at home for pain, Mia Creek made aware. Writer and Mia Creek to both follow up with attending Md. Dr. Leslye Peer as she currently has no order for this. Chart notes reviewed, patient is currently in sinus rhythm. No further IV medications used for heart rate control. Please note patient will need resumption of care orders for skilled nursing, social work and possibly physical therapy at discharge.CMRN Joni Reining made aware. Thank you. Flo Shanks RN, BSN, [place] [facility] Liaison [phone] c

## 2016-01-03 NOTE — Care Management Important Message (Signed)
Important Message  Patient Details  Name: Sandy [name] MRN: [id number] Date of Birth: Apr 01, 1946   Medicare Important Message Given:  Yes    Katrina Stack, RN 01/03/2016, 2:44 PM

## 2016-01-03 NOTE — Progress Notes (Signed)
Patient is hemodynamically stable,remains of cdiff precaution,iv antibiotics continue,normal sinus rhythm,up with PT BUT STILL VERY WEAK,PAIN MANAGEMENT IN PROGRESS.

## 2016-01-03 NOTE — Progress Notes (Signed)
Pharmacy Antibiotic Note  Sandy [name] is a 70 y.o. female admitted on 12/31/2015 with sepsis.  Pharmacy has been consulted for cefepime dosing.  Plan: CrCl and Scr improved. CrCl now >56ml/min. Will increase cefepime dose to Cefepime 2gm IV every 8 hours.   Height: 5\' 5"  (165.1 cm) Weight: 155 lb 12.8 oz (70.7 kg) IBW/kg (Calculated) : 57  Temp (24hrs), Avg:99.3 F (37.4 C), Min:97.9 F (36.6 C), Max:100.5 F (38.1 C)   Recent Labs Lab 12/30/15 1144 12/31/15 1749 12/31/15 1752 12/31/15 2047 01/01/16 0647 01/02/16 0749 01/03/16 0336  WBC 1.1* 0.5*  --   --  1.7* 0.6* 1.0*  CREATININE  --  1.00  --   --  0.80  --   --   LATICACIDVEN  --   --  2.3* 1.6  --   --   --     Estimated Creatinine Clearance: 62.7 mL/min (by C-G formula based on SCr of 0.8 mg/dL).    Allergies  Allergen Reactions  . Tylenol [Acetaminophen] Nausea And Vomiting    Antimicrobials this admission: cefepime  >>   vancomycin x1 >> 9/19  Dose adjustments this admission:  Microbiology results: 9/19 BCx: NG x 3 days 9/19 UCx: NG final  7/8 MRSA PCR: (-)   9/19 CXR: no infiltrate 9/19 UA: (-)  Thank you for allowing pharmacy to be a part of this patient's care.  Tabathia Knoche M Maleeya Peterkin 01/03/2016 10:59 AM

## 2016-01-03 NOTE — Care Management (Signed)
Patient was admitted with altered mental status and neutropenic fever.  Under going chemo for stage IV colon cancer.  transferred to 2A for Atrial fib.  She received dioxin and cardizem IVP.  She is followed by [facility] home health SN and social work.  May benefit from adding physical therapy.  PT consult is pending- was unable to work with patient due to heart rate issues on 9.21.  There is a recommendation documented for skilled nursing but this could change

## 2016-01-03 NOTE — Progress Notes (Signed)
[facility] Encounter Note  Patient: Sandy [name] / Admit Date: 12/31/2015 / Date of Encounter: 01/03/2016, 8:25 AM   Subjective: Patient is breathing better with less weakness and fatigue. Patient has spontaneously converted to normal sinus rhythm and is hemodynamically stable  Review of Systems: Positive for: Weakness and fatigue Negative for: Vision change, hearing change, syncope, dizziness, nausea, vomiting,diarrhea, bloody stool, stomach pain, cough, congestion, diaphoresis, urinary frequency, urinary pain,skin lesions, skin rashes Others previously listed  Objective: Telemetry: Normal sinus rhythm Physical Exam: Blood pressure 135/72, pulse 62, temperature 98.6 F (37 C), temperature source Oral, resp. rate 18, height 5\' 5"  (1.651 m), weight 155 lb 12.8 oz (70.7 kg), SpO2 99 %. Body mass index is 25.93 kg/m. General: Well developed, well nourished, in no acute distress. Head: Normocephalic, atraumatic, sclera non-icteric, no xanthomas, nares are without discharge. Neck: No apparent masses Lungs: Normal respirations with few wheezes, no rhonchi, no rales , some crackles   Heart: Regular rate and rhythm, normal S1 S2, no murmur, no rub, no gallop, PMI is normal size and placement, carotid upstroke normal without bruit, jugular venous pressure normal Abdomen: Soft, non-tender, non-distended with normoactive bowel sounds. No hepatosplenomegaly. Abdominal aorta is normal size without bruit Extremities: Trace edema, no clubbing, no cyanosis, no ulcers,  Peripheral: 2+ radial, 2+ femoral, 2+ dorsal pedal pulses Neuro: Alert and oriented. Moves all extremities spontaneously. Psych:  Responds to questions appropriately with a normal affect.   Intake/Output Summary (Last 24 hours) at 01/03/16 0825 Last data filed at 01/03/16 0600  Gross per 24 hour  Intake              850 ml  Output                0 ml  Net              850 ml    Inpatient Medications:   . amiodarone  400 mg Oral Daily  . atorvastatin  40 mg Oral Daily  . ceFEPime (MAXIPIME) IV  2 g Intravenous Q12H  . gabapentin  100 mg Oral q morning - 10a   And  . gabapentin  200 mg Oral QHS  . insulin aspart  0-9 Units Subcutaneous TID WC  . metoprolol tartrate  25 mg Oral BID   Infusions:  . sodium chloride 30 mL/hr at 01/02/16 1353    Labs:  Recent Labs  12/31/15 1749 01/01/16 0647  NA 138 142  K 3.8 3.9  CL 109 118*  CO2 23 19*  GLUCOSE 158* 97  BUN 8 7  CREATININE 1.00 0.80  CALCIUM 8.9 8.2*    Recent Labs  12/31/15 1749  AST 16  ALT 9*  ALKPHOS 98  BILITOT 0.7  PROT 5.9*  ALBUMIN 2.6*    Recent Labs  12/31/15 1749  01/02/16 0749 01/03/16 0336  WBC 0.5*  < > 0.6* 1.0*  NEUTROABS 0.1*  --   --  0.5*  HGB 8.7*  < > 7.4* 7.4*  HCT 26.4*  < > 22.4* 22.5*  MCV 93.2  < > 92.5 92.2  PLT 67*  < > 43* 49*  < > = values in this interval not displayed.  Recent Labs  01/01/16 0113 01/01/16 0647 01/01/16 1131  TROPONINI <0.03 <0.03 <0.03   Invalid input(s): POCBNP No results for input(s): HGBA1C in the last 72 hours.   Weights: Filed Weights   01/01/16 0445 01/02/16 0500 01/02/16 1504  Weight: 152 lb (68.9 kg)  155 lb 6.4 oz (70.5 kg) 155 lb 12.8 oz (70.7 kg)     Radiology/Studies:  Dg Chest 1 View  Result Date: 01/02/2016 CLINICAL DATA:  Fatigue.  Hypertension EXAM: CHEST 1 VIEW COMPARISON:  December 31, 2015 FINDINGS: Port-A-Cath tip is in the right atrium. No pneumothorax. No edema or consolidation. Heart is borderline enlarged with pulmonary vascularity within normal limits. No adenopathy. There is atherosclerotic calcification in the aorta. No bone lesions. IMPRESSION: Port-A-Cath position unchanged. No pneumothorax. Stable cardiac silhouette. Aortic atherosclerosis. Electronically Signed   By: Lowella Grip III M.D.   On: 01/02/2016 14:38   Dg Chest 2 View  Result Date: 12/31/2015 CLINICAL DATA:  Fever since this morning. History of  sickle cell anemia. EXAM: CHEST  2 VIEW COMPARISON:  Chest x-ray 11/09/2015 FINDINGS: The heart is mildly enlarged but stable. The power port is stable. The tip is in the right atrium. The lungs are clear. No pleural effusion. The bony thorax is intact. IMPRESSION: Mild stable cardiac enlargement. No infiltrates, edema or effusions. Electronically Signed   By: Marijo Sanes M.D.   On: 12/31/2015 18:15   Ct Head Wo Contrast  Result Date: 12/31/2015 CLINICAL DATA:  Acute onset of altered mental status. Decreased white blood cell count. Diarrhea. Initial encounter. EXAM: CT HEAD WITHOUT CONTRAST TECHNIQUE: Contiguous axial images were obtained from the base of the skull through the vertex without intravenous contrast. COMPARISON:  CT of the head performed 11/06/2015, and MRI of the brain performed 11/09/2015 FINDINGS: Brain: No evidence of acute infarction, hemorrhage, hydrocephalus, extra-axial collection or mass lesion/mass effect. Prominence of the ventricles and sulci reflects mild cortical volume loss. Scattered periventricular and subcortical white matter change likely reflects small vessel ischemic microangiopathy. The brainstem and fourth ventricle are within normal limits. The basal ganglia are unremarkable in appearance. The cerebral hemispheres demonstrate grossly normal gray-white differentiation. No mass effect or midline shift is seen. Vascular: No hyperdense vessel or unexpected calcification. Skull: There is no evidence of fracture; visualized osseous structures are unremarkable in appearance. Sinuses/Orbits: The visualized portions of the orbits are within normal limits. There is opacification of the left mastoid air cells. The paranasal sinuses and right mastoid air cells are well-aerated. Other: No significant soft tissue abnormalities are seen. IMPRESSION: 1. No acute intracranial pathology seen on CT. 2. Mild cortical volume loss and scattered small vessel ischemic microangiopathy. 3.  Opacification of the left mastoid air cells. Electronically Signed   By: Garald Balding M.D.   On: 12/31/2015 18:50     Assessment and Recommendation  70 y.o. female with essential hypertension diabetes with complication now with metastatic colon cancer having recent illness and atrial fibrillation with rapid ventricular rate now converted to normal sinus rhythm with appropriate medication management 1. Continue amiodarone orally at 400 mg each day for maintenance of normal rhythm with heart rate control 2. Metoprolol at low dose as well for heart rate control and risk reduction of atrial fibrillation 3. High intensity cholesterol therapy with atorvastatin as necessary 4. Further consideration of anticoagulation for further risk reduction in stroke with atrial fibrillation although with recent cancer treatment and liver abnormalities would be concerned about the possibility of bleeding complications and/or fall risk 5. Begin ambulation and follow for adjustments of medications and call if further questions  Signed, Serafina Royals M.D. FACC

## 2016-01-03 NOTE — Progress Notes (Signed)
Patient ID: Sandy [name], female   DOB: [birth year], 70 y.o.   MRN: [id number]   [facility] PROGRESS NOTE  Sandy [name] [id number] DOB: 01/27/1946 DOA: 12/31/2015 PCP: [name], MD  HPI/Subjective: Patient having left lower abdominal pain. Patient's mental status is better. Had a temperature last night.  Objective: Vitals:   01/03/16 0824 01/03/16 1100  BP: 135/72 135/67  Pulse: 62 64  Resp: 18 20  Temp: 98.6 F (37 C) 99 F (37.2 C)    Filed Weights   01/01/16 0445 01/02/16 0500 01/02/16 1504  Weight: 68.9 kg (152 lb) 70.5 kg (155 lb 6.4 oz) 70.7 kg (155 lb 12.8 oz)    ROS: Review of Systems  Constitutional: Positive for malaise/fatigue. Negative for chills and fever.  Eyes: Negative for blurred vision.  Respiratory: Negative for cough and shortness of breath.   Cardiovascular: Negative for chest pain.  Gastrointestinal: Positive for abdominal pain and diarrhea. Negative for constipation, nausea and vomiting.  Genitourinary: Negative for dysuria.  Musculoskeletal: Negative for joint pain.  Neurological: Positive for weakness. Negative for dizziness and headaches.   Exam: Physical Exam  HENT:  Nose: No mucosal edema.  Mouth/Throat: No oropharyngeal exudate or posterior oropharyngeal edema.  Eyes: Conjunctivae, EOM and lids are normal. Pupils are equal, round, and reactive to light.  Neck: No JVD present. Carotid bruit is not present. No edema present. No thyroid mass and no thyromegaly present.  Cardiovascular: S1 normal and S2 normal.  An irregularly irregular rhythm present. Tachycardia present.  Exam reveals no gallop.   Murmur heard.  Systolic murmur is present with a grade of 2/6  Pulses:      Dorsalis pedis pulses are 2+ on the right side, and 2+ on the left side.  Respiratory: No respiratory distress. She has decreased breath sounds in the right middle field, the right lower field, the left middle field and the left lower field. She  has no wheezes. She has no rhonchi. She has no rales.  GI: Soft. Bowel sounds are normal. There is tenderness in the left lower quadrant.  Musculoskeletal:       Right ankle: She exhibits swelling.       Left ankle: She exhibits swelling.  Lymphadenopathy:    She has no cervical adenopathy.  Neurological: She is alert. No cranial nerve deficit.  Skin: Skin is warm. No rash noted. Nails show no clubbing.  Psychiatric: She has a normal mood and affect.      Data Reviewed: Basic Metabolic Panel:  Recent Labs Lab 12/31/15 1749 01/01/16 0647  NA 138 142  K 3.8 3.9  CL 109 118*  CO2 23 19*  GLUCOSE 158* 97  BUN 8 7  CREATININE 1.00 0.80  CALCIUM 8.9 8.2*   Liver Function Tests:  Recent Labs Lab 12/31/15 1749  AST 16  ALT 9*  ALKPHOS 98  BILITOT 0.7  PROT 5.9*  ALBUMIN 2.6*    Recent Labs Lab 12/31/15 1905  AMMONIA <9*   CBC:  Recent Labs Lab 12/30/15 1144 12/31/15 1749 01/01/16 0647 01/02/16 0749 01/03/16 0336  WBC 1.1* 0.5* 1.7* 0.6* 1.0*  NEUTROABS 0.4* 0.1*  --   --  0.5*  HGB 10.2* 8.7* 7.4* 7.4* 7.4*  HCT 30.5* 26.4* 22.1* 22.4* 22.5*  MCV 91.4 93.2 92.6 92.5 92.2  PLT 125* 67* PLATELET CLUMPS NOTED ON SMEAR, UNABLE TO ESTIMATE 43* 49*   Cardiac Enzymes:  Recent Labs Lab 01/01/16 0113 01/01/16 0647 01/01/16 1131  TROPONINI <0.03 <  0.03 <0.03   BNP (last 3 results)  Recent Labs  10/18/15 0921  BNP 159.0*    Studies: Dg Chest 1 View  Result Date: 01/02/2016 CLINICAL DATA:  Fatigue.  Hypertension EXAM: CHEST 1 VIEW COMPARISON:  December 31, 2015 FINDINGS: Port-A-Cath tip is in the right atrium. No pneumothorax. No edema or consolidation. Heart is borderline enlarged with pulmonary vascularity within normal limits. No adenopathy. There is atherosclerotic calcification in the aorta. No bone lesions. IMPRESSION: Port-A-Cath position unchanged. No pneumothorax. Stable cardiac silhouette. Aortic atherosclerosis. Electronically Signed   By:  Lowella Grip III M.D.   On: 01/02/2016 14:38    Scheduled Meds: . amiodarone  400 mg Oral Daily  . atorvastatin  40 mg Oral Daily  . ceFEPime (MAXIPIME) IV  2 g Intravenous Q8H  . gabapentin  100 mg Oral q morning - 10a   And  . gabapentin  200 mg Oral QHS  . insulin aspart  0-9 Units Subcutaneous TID WC  . metoprolol tartrate  25 mg Oral BID    Assessment/Plan:  1. Rapid atrial fibrillation Converted to normal sinus rhythm. Receive 1 dose of IV amiodarone yesterday and increased oral amiodarone. 2. Neutropenic fever.  Had a fever last night. Continue Maxipime. Case discussed with Dr. Mike Gip oncology and she is hoping that the white count will soon come up. 3. Lactic acidosis. Stop metformin. 4. Diarrhea. Stool for C. difficile is negative. Still awaiting GI panel 5. Acute encephalopathy. Improved. 6. Type 2 diabetes mellitus on sliding scale 7. Metastatic colon cancer. Oncology follow-up 8. Hyperlipidemia unspecified on atorvastatin 9. Neuropathy. Gabapentin 10. Abdominal pain left lower quadrant. Restart oxycodone when necessary  Code Status:     Code Status Orders        Start     Ordered   12/31/15 2300  Full code  Continuous     12/31/15 2259    Code Status History    Date Active Date Inactive Code Status Order ID Comments User Context   11/11/2015  8:03 AM 11/12/2015  8:20 PM DNR [id number]  Hillary Bow, MD Inpatient   11/08/2015  2:54 PM 11/08/2015  5:44 PM DNR [id number]  Awilda Bill, NP Inpatient   11/07/2015  6:17 PM 11/08/2015  2:54 PM Full Code [id number]  Vaughan Basta, MD Inpatient   08/28/2014  4:37 PM 08/29/2014  8:42 PM Full Code [id number]  Theodoro Grist, MD Inpatient    Advance Directive Documentation   Flowsheet Row Most Recent Value  Type of Advance Directive  Healthcare Power of Attorney  Pre-existing out of facility DNR order (yellow form or pink MOST form)  No data  "MOST" Form in Place?  No data      Disposition Plan: To be  determined  Consultants:  Oncology  Antibiotics:  Maxipime  Time spent: 25 minutes  [place], [place]

## 2016-01-03 NOTE — Evaluation (Signed)
Physical Therapy Evaluation Patient Details Name: Sandy [name] MRN: [id number] DOB: Sep 04, 1945 Today's Date: 01/03/2016   History of Present Illness  Pt. 70y.o. female with hx. metastatic colon cancer, admitted 12/31/15 for sepsis, neutropenic fever and altered mental status. since admission pt. demonstrates Afib which is now managed with medication.   Clinical Impression  Pt. Lethargic supine in bed upon arrival. Pt. Lethargic throughout session, with limited verbal communication, able to follow commands and perform LE exercises in bed. Pt. Demonstrates generalized weakness B UE strength grossly 4/5 symmetrical, B LE strength grossly 4/5 symmetrical. B UE and B LE sensation intact and symmetrical. Pt. Required max A to transfer from supine to EOB. With sitting at EOB pt. Demonstrates R lateral lean, able to self correct inconsistently with verbal cues. Pt. Able to maintain midline sitting balance EOB momentarily if assisted to position with B UE and LE support. She was able to stand x3 from EOB to RW with +2 mod A and B UE assist pulling on RW pt. Demonstrates R lateral lean in standing as well. Able to take approx 3 steps to transfer to chair with use of RW BUE support, max x2 for RW and IV negotiation, trunk support, and verbal and tactile cues for stepping and weight shifting, pt. Demonstrates R lateral lean throughout transfer and limited initiation of stepping with R foot. Pt. Positioned in recliner chair with wedge under R side of pelvis to attain midline positioning, pt. Able to maintain midline with this addition.  Pt. Able to recognized full visual field upon testing. Would benefit from skilled PT to address above deficits and promote optimal return to PLOF Recommend SNF placement upon d/c to follow up with skilled PT needs.     Follow Up Recommendations SNF    Equipment Recommendations  Rolling walker with 5" wheels    Recommendations for Other Services       Precautions /  Restrictions Precautions Precautions: Fall Restrictions Weight Bearing Restrictions: No      Mobility  Bed Mobility Overal bed mobility: Needs Assistance Bed Mobility: Supine to Sit     Supine to sit: Max assist     General bed mobility comments: pt. able to move LE towards  side of bed while still in supine, required max A for leg and trunk movement/positioning. pt. did assist with one UE on bedrail   Transfers Overall transfer level: Needs assistance Equipment used: Rolling walker (2 wheeled) Transfers: Sit to/from Stand Sit to Stand: Mod assist         General transfer comment: Pt. uses B UE support from RW to pull to standing   Ambulation/Gait Ambulation/Gait assistance: Max assist;+2 physical assistance;+2 safety/equipment Ambulation Distance (Feet): 2 Feet Assistive device: Rolling walker (2 wheeled)       General Gait Details: Pt. able to take approx 3 steps from EOB to chair requiring verbal and tactile cues for stepping. Pt. performed about 3 short/small steps required max Ax2 to negotiate RW, IV pole, and facilitate weight shifting and trunk support throughout transfer. Pt. demonstrates R sided lean and limited stepping with R foot in the R direction.   Stairs            Wheelchair Mobility    Modified Rankin (Stroke Patients Only)       Balance Overall balance assessment: Needs assistance Sitting-balance support: Bilateral upper extremity supported;Feet supported Sitting balance-Leahy Scale: Poor Sitting balance - Comments: Pt. intermittently able to maintain midline sitting balance if assisted into midline position. pt.  demonstrates R lateral lean when unassisted, able to self correct inconsistently with verbal cues. Able to maintain midline in sitting with wedge placed under R side of pelvis in recliner shair.  Postural control: Right lateral lean Standing balance support: Bilateral upper extremity supported Standing balance-Leahy Scale:  Poor Standing balance comment: Pt. also demonstrates R lateral lean in standing, providing resistance with tactile cues to correct. Requires physical assist to weight shift to the L side in standing,                              Pertinent Vitals/Pain Pain Assessment: 0-10 Pain Score: 4  Pain Location: L sided abdominal pain     Home Living Family/patient expects to be discharged to:: Private residence Living Arrangements: Children (Pt. lives with daughter) Available Help at Discharge: Family Type of Home: Apartment Home Access: Level entry     Home Layout: One level Home Equipment: Environmental consultant - 2 wheels Additional Comments: Pt. reports she lives in a first floor single story apt. with her daughter and no stairs to negotiate. Pt. reports she uses a RW for ambulation primarily within her home only. Daughter assists with ADL's cooking and cleaning. Pt. reports she was previously able to ambulate and perform bed mobility without assistance from her daughter    Prior Function Level of Independence: Needs assistance   Gait / Transfers Assistance Needed: Pt. uses RW for limited ambulation and mobilty   ADL's / Homemaking Assistance Needed: Daughter provides assist.         Hand Dominance        Extremity/Trunk Assessment   Upper Extremity Assessment:  (Pt. demonstrates  B UE strength grossly 4/5 symmetrical sensation intact)           Lower Extremity Assessment:  (Pt. demonstrates B LE strength grossly 4/5 symmetrical, sensation intact)         Communication   Communication: HOH (Pt. provides limited verbal communication )  Cognition Arousal/Alertness: Lethargic Behavior During Therapy: WFL for tasks assessed/performed Overall Cognitive Status: No family/caregiver present to determine baseline cognitive functioning                      General Comments      Exercises Other Exercises Other Exercises: supine exercises for strengthening B LE x10;  Ankle pumps, heel slides, SAQ, SLR (AAROM), hip add/abd   Assessment/Plan    PT Assessment Patient needs continued PT services  PT Problem List Decreased strength;Decreased balance;Decreased mobility;Decreased activity tolerance;Decreased knowledge of use of DME          PT Treatment Interventions DME instruction;Functional mobility training;Therapeutic activities;Gait training;Therapeutic exercise;Neuromuscular re-education;Balance training    PT Goals (Current goals can be found in the Care Plan section)  Acute Rehab PT Goals Patient Stated Goal: Pt. would like to go home with daughter PT Goal Formulation: With patient Time For Goal Achievement: 01/17/16 Potential to Achieve Goals: Fair    Frequency Min 2X/week   Barriers to discharge Decreased caregiver support      Co-evaluation               End of Session Equipment Utilized During Treatment: Gait belt Activity Tolerance: Patient limited by fatigue Patient left: in chair;with call bell/phone within reach;with chair alarm set Nurse Communication: Mobility status (Informed nurse of assist needed for transfers and R sided lean)         Time: [id number] PT Time Calculation (min) (ACUTE  ONLY): 28 min   Charges:         PT G Codes:        Ronith Berti, SPT  01/03/16,3:38 PM

## 2016-01-04 LAB — GLUCOSE, CAPILLARY
GLUCOSE-CAPILLARY: 97 mg/dL (ref 65–99)
GLUCOSE-CAPILLARY: 88 mg/dL (ref 65–99)
Glucose-Capillary: 87 mg/dL (ref 65–99)
Glucose-Capillary: 73 mg/dL (ref 65–99)
Glucose-Capillary: 112 mg/dL — ABNORMAL HIGH (ref 65–99)

## 2016-01-04 LAB — CBC WITH DIFFERENTIAL/PLATELET
Basophils Absolute: 0 10*3/uL (ref 0–0.1)
Basophils Relative: 0 %
Eosinophils Absolute: 0 10*3/uL (ref 0–0.7)
Eosinophils Relative: 1 %
HCT: 24.5 % — ABNORMAL LOW (ref 35.0–47.0)
Hemoglobin: 8.3 g/dL — ABNORMAL LOW (ref 12.0–16.0)
Lymphocytes Relative: 17 %
Lymphs Abs: 0.6 10*3/uL — ABNORMAL LOW (ref 1.0–3.6)
MCH: 31.2 pg (ref 26.0–34.0)
MCHC: 33.7 g/dL (ref 32.0–36.0)
MCV: 92.8 fL (ref 80.0–100.0)
Monocytes Absolute: 0.2 10*3/uL (ref 0.2–0.9)
Monocytes Relative: 5 %
Neutro Abs: 2.6 10*3/uL (ref 1.4–6.5)
Neutrophils Relative %: 77 %
Platelets: 73 10*3/uL — ABNORMAL LOW (ref 150–440)
RBC: 2.64 MIL/uL — ABNORMAL LOW (ref 3.80–5.20)
RDW: 17 % — ABNORMAL HIGH (ref 11.5–14.5)
WBC: 3.4 10*3/uL — ABNORMAL LOW (ref 3.6–11.0)

## 2016-01-04 MED ORDER — LEVOFLOXACIN 500 MG PO TABS
500.0000 mg | ORAL_TABLET | Freq: Every day | ORAL | Status: DC
  Administered 2016-01-04 – 2016-01-05 (×2): 500 mg via ORAL
  Filled 2016-01-04 (×2): qty 1

## 2016-01-04 MED ORDER — LEVOFLOXACIN IN D5W 500 MG/100ML IV SOLN
500.0000 mg | INTRAVENOUS | Status: DC
Start: 2016-01-04 — End: 2016-01-04

## 2016-01-04 MED ORDER — BOOST / RESOURCE BREEZE PO LIQD
1.0000 | Freq: Three times a day (TID) | ORAL | Status: DC
  Administered 2016-01-04 – 2016-01-05 (×4): 1 via ORAL

## 2016-01-04 NOTE — Progress Notes (Signed)
Patient has been sleeping well with no incident. Oxycodone 5 mg PRN was administered at bedtime for c/o back pain on a scale of 6/10 on a pain scale. Patient slept after the pain medication kicked in. No acute respiratory distress noted. Will continue to monitor.

## 2016-01-04 NOTE — Progress Notes (Signed)
Patient ID: Sandy [name], female   DOB: January 23, 1946, 70 y.o.   MRN: [id number]   [facility] PROGRESS NOTE  Sandy [name] [id number] DOB: 07-Jan-1946 DOA: 12/31/2015 PCP: [name], MD  HPI/Subjective: Patient feeling better, fever resolved, not much appetite denies any chest pain or shortness of breath No further diarrhea Objective: Vitals:   01/04/16 1032 01/04/16 1150  BP: (!) 153/77 (!) 146/75  Pulse: 70 62  Resp:  20  Temp:  98.4 F (36.9 C)    Filed Weights   01/02/16 0500 01/02/16 1504 01/04/16 0555  Weight: 155 lb 6.4 oz (70.5 kg) 155 lb 12.8 oz (70.7 kg) 153 lb 12.8 oz (69.8 kg)    ROS: Review of Systems  Constitutional: Positive for malaise/fatigue. Negative for chills and fever.  Eyes: Negative for blurred vision.  Respiratory: Negative for cough and shortness of breath.   Cardiovascular: Negative for chest pain.  Gastrointestinal: Negative for abdominal pain, constipation, diarrhea, nausea and vomiting.  Genitourinary: Negative for dysuria.  Musculoskeletal: Positive for back pain. Negative for joint pain.  Neurological: Positive for weakness. Negative for dizziness and headaches.   Exam: Physical Exam  HENT:  Nose: No mucosal edema.  Mouth/Throat: No oropharyngeal exudate or posterior oropharyngeal edema.  Eyes: Conjunctivae, EOM and lids are normal. Pupils are equal, round, and reactive to light.  Neck: No JVD present. Carotid bruit is not present. No edema present. No thyroid mass and no thyromegaly present.  Cardiovascular: S1 normal and S2 normal.  An irregularly irregular rhythm present. Tachycardia present.  Exam reveals no gallop.   Murmur heard.  Systolic murmur is present with a grade of 2/6  Pulses:      Dorsalis pedis pulses are 2+ on the right side, and 2+ on the left side.  Respiratory: No respiratory distress. She has decreased breath sounds. She has no wheezes. She has no rhonchi. She has no rales.  GI: Soft. Bowel  sounds are normal. There is tenderness in the left lower quadrant.  Musculoskeletal:       Right ankle: She exhibits swelling.       Left ankle: She exhibits swelling.  Lymphadenopathy:    She has no cervical adenopathy.  Neurological: She is alert. No cranial nerve deficit.  Skin: Skin is warm. No rash noted. Nails show no clubbing.  Psychiatric: She has a normal mood and affect.      Data Reviewed: Basic Metabolic Panel:  Recent Labs Lab 12/31/15 1749 01/01/16 0647  NA 138 142  K 3.8 3.9  CL 109 118*  CO2 23 19*  GLUCOSE 158* 97  BUN 8 7  CREATININE 1.00 0.80  CALCIUM 8.9 8.2*   Liver Function Tests:  Recent Labs Lab 12/31/15 1749  AST 16  ALT 9*  ALKPHOS 98  BILITOT 0.7  PROT 5.9*  ALBUMIN 2.6*    Recent Labs Lab 12/31/15 1905  AMMONIA <9*   CBC:  Recent Labs Lab 12/30/15 1144 12/31/15 1749 01/01/16 0647 01/02/16 0749 01/03/16 0336 01/04/16 0611  WBC 1.1* 0.5* 1.7* 0.6* 1.0* 3.4*  NEUTROABS 0.4* 0.1*  --   --  0.5* 2.6  HGB 10.2* 8.7* 7.4* 7.4* 7.4* 8.3*  HCT 30.5* 26.4* 22.1* 22.4* 22.5* 24.5*  MCV 91.4 93.2 92.6 92.5 92.2 92.8  PLT 125* 67* PLATELET CLUMPS NOTED ON SMEAR, UNABLE TO ESTIMATE 43* 49* 73*   Cardiac Enzymes:  Recent Labs Lab 01/01/16 0113 01/01/16 0647 01/01/16 1131  TROPONINI <0.03 <0.03 <0.03   BNP (last  3 results)  Recent Labs  10/18/15 0921  BNP 159.0*    Studies: Dg Chest 1 View  Result Date: 01/02/2016 CLINICAL DATA:  Fatigue.  Hypertension EXAM: CHEST 1 VIEW COMPARISON:  December 31, 2015 FINDINGS: Port-A-Cath tip is in the right atrium. No pneumothorax. No edema or consolidation. Heart is borderline enlarged with pulmonary vascularity within normal limits. No adenopathy. There is atherosclerotic calcification in the aorta. No bone lesions. IMPRESSION: Port-A-Cath position unchanged. No pneumothorax. Stable cardiac silhouette. Aortic atherosclerosis. Electronically Signed   By: Lowella Grip III M.D.    On: 01/02/2016 14:38    Scheduled Meds: . amiodarone  400 mg Oral Daily  . atorvastatin  40 mg Oral Daily  . feeding supplement  1 Container Oral TID BM  . gabapentin  100 mg Oral q morning - 10a   And  . gabapentin  200 mg Oral QHS  . insulin aspart  0-9 Units Subcutaneous TID WC  . levofloxacin  500 mg Oral Daily  . metoprolol tartrate  25 mg Oral BID    Assessment/Plan:  1. Rapid atrial fibrillation Converted to normal sinus rhythm. Receive 1 dose of IV amiodarone, Continue oral amiodarone. 2. Neutropenic fever.  Fever now resolved WBC trending up 3. Lactic acidosis. Suspect due to fever 4. Diarrhea. Stool for C. difficile is negative. Resolved  5. Acute encephalopathy. Improved. 6. Type 2 diabetes mellitus on sliding scale continue to monitor 7. Metastatic colon cancer. Oncology follow-up  poor appetite start patient on boost  8. Hyperlipidemia unspecified on atorvastatin 9. Neuropathy. Gabapentin 10. Abdominal pain left lower quadrant. improved   Code Status:     Code Status Orders        Start     Ordered   12/31/15 2300  Full code  Continuous     12/31/15 2259    Code Status History    Date Active Date Inactive Code Status Order ID Comments User Context   11/11/2015  8:03 AM 11/12/2015  8:20 PM DNR [id number]  Hillary Bow, MD Inpatient   11/08/2015  2:54 PM 11/08/2015  5:44 PM DNR [id number]  Awilda Bill, NP Inpatient   11/07/2015  6:17 PM 11/08/2015  2:54 PM Full Code [id number]  Vaughan Basta, MD Inpatient   08/28/2014  4:37 PM 08/29/2014  8:42 PM Full Code [id number]  Theodoro Grist, MD Inpatient    Advance Directive Documentation   Flowsheet Row Most Recent Value  Type of Advance Directive  Healthcare Power of Attorney  Pre-existing out of facility DNR order (yellow form or pink MOST form)  No data  "MOST" Form in Place?  No data      Disposition Plan: To be determined  Consultants:  Oncology  Antibiotics:  Maxipime  Time spent: 25  minutes  [place], [facility]

## 2016-01-04 NOTE — Progress Notes (Signed)
[facility] Hematology/Oncology Progress Note  Date of admission: 12/31/2015  Hospital day:  01/03/2016  Chief Complaint: Sandy [name] is a 70 y.o. female with stage IV colon cancer who was admitted with altered mental status, fever and neutropenia.  Subjective:  Patient feels weak.  Diarrhea minimal.  Eating some.  Social History: The patient is accompanied by her daughter today.   Allergies:  Allergies  Allergen Reactions  . Tylenol [Acetaminophen] Nausea And Vomiting    Scheduled Medications: . amiodarone  400 mg Oral Daily  . atorvastatin  40 mg Oral Daily  . ceFEPime (MAXIPIME) IV  2 g Intravenous Q8H  . gabapentin  100 mg Oral q morning - 10a   And  . gabapentin  200 mg Oral QHS  . insulin aspart  0-9 Units Subcutaneous TID WC  . metoprolol tartrate  25 mg Oral BID    Review of Systems: GENERAL: Feeling better.  Fatigue. Afebrile.No sweats. PERFORMANCE STATUS (ECOG): 2-3. HEENT: No visual changes, runny nose, sore throat, mouth sores or tenderness. Lungs: No shortness of breath or cough. No hemoptysis. Cardiac: No chest pain, palpitations, orthopnea, or PND. GI: Little diarrhea and intermittent abdominal cramping.  No nausea, vomiting, diarrhea, melena or hematochezia. GU: No dysuria or hematuria. Musculoskeletal: Denies back pain. No joint pain. No muscle tenderness. Extremities: No pain or swelling. 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 No headache, numbness or weakness, or balance issues. Endocrine: Diabetes. No thyroid issues, hot flashes or night sweats. Psych: No mood changes or anxiety. Pain: Denies pain. Review of systems: All other systems reviewed and found to be negative.  Physical Exam: Blood pressure (!) 142/72, pulse 62, temperature 98.2 F (36.8 C), temperature source Oral, resp. rate 18, height 5\' 5"  (1.651 m), weight 155 lb 12.8 oz (70.7  kg), SpO2 97 %.  GENERAL: Elderly woman sitting up eating dinner (eating pie crust) on the medical unit in no acute distress. MENTAL STATUS: Alert and oriented to person, place and time. HEAD: Wearing a cap. Normocephalic, atraumatic, face symmetric, no Cushingoid features. EYES: Wearing glasses.  Hazel/green eyes. Pupils equal round and reactive to light and accomodation. No conjunctivitis or scleral icterus. ENT: Oropharynx clear without lesion. Missing several teeth. Tongue normal. Mucous membranes moist.  RESPIRATORY: Decreased respiratory excursion.  Clear to auscultation without rales, wheezes or rhonchi. CARDIOVASCULAR: Regular rate and rhythm without murmur, rub or gallop. ABDOMEN: Soft, minimally tender in the mid lower quadrant.  Active bowel sounds. No guarding or rebound tenderness. No hepatosplenomegaly. SKIN: No rashes, ulcer or skin lesions.  EXTREMITIES: Trace edema.  No skin discoloration or tenderness. No palpable cords. NEUROLOGICAL: Appropriate.  Conversant. PSYCH: Affect normal.   Results for orders placed or performed during the hospital encounter of 12/31/15 (from the past 48 hour(s))  Glucose, capillary     Status: None   Collection Time: 01/02/16  7:35 AM  Result Value Ref Range   Glucose-Capillary 94 65 - 99 mg/dL  CBC     Status: Abnormal   Collection Time: 01/02/16  7:49 AM  Result Value Ref Range   WBC 0.6 (LL) 3.6 - 11.0 K/uL    Comment: RESULT REPEATED AND VERIFIED CRITICAL RESULT CALLED TO, READ BACK BY AND VERIFIED WITH: SKYLAR STONE @ 0846 BY [facility]    RBC 2.42 (L) 3.80 - 5.20 MIL/uL   Hemoglobin 7.4 (L) 12.0 - 16.0 g/dL   HCT 22.4 (L) 35.0 - 47.0 %  MCV 92.5 80.0 - 100.0 fL   MCH 30.4 26.0 - 34.0 pg   MCHC 32.9 32.0 - 36.0 g/dL   RDW 17.1 (H) 11.5 - 14.5 %   Platelets 43 (L) 150 - 440 K/uL  Glucose, capillary     Status: None   Collection Time: 01/02/16 11:29 AM  Result Value Ref Range   Glucose-Capillary 95 65 - 99 mg/dL   Glucose, capillary     Status: Abnormal   Collection Time: 01/02/16  5:23 PM  Result Value Ref Range   Glucose-Capillary 112 (H) 65 - 99 mg/dL  Glucose, capillary     Status: Abnormal   Collection Time: 01/02/16  9:16 PM  Result Value Ref Range   Glucose-Capillary 177 (H) 65 - 99 mg/dL   Comment 1 Notify RN    Comment 2 Document in Chart   CBC with Differential     Status: Abnormal   Collection Time: 01/03/16  3:36 AM  Result Value Ref Range   WBC 1.0 (LL) 3.6 - 11.0 K/uL    Comment: RESULT REPEATED AND VERIFIED CRITICAL VALUE NOTED.  VALUE IS CONSISTENT WITH PREVIOUSLY REPORTED AND CALLED VALUE.    RBC 2.44 (L) 3.80 - 5.20 MIL/uL   Hemoglobin 7.4 (L) 12.0 - 16.0 g/dL   HCT 22.5 (L) 35.0 - 47.0 %   MCV 92.2 80.0 - 100.0 fL   MCH 30.4 26.0 - 34.0 pg   MCHC 33.0 32.0 - 36.0 g/dL   RDW 17.4 (H) 11.5 - 14.5 %   Platelets 49 (L) 150 - 440 K/uL   Neutrophils Relative % 51 %    Comment: NOTIFIED BRITTANY CLINGAN ON 01/03/16 ON 0629 QSD CORRECTED ON 09/22 AT [id number]: PREVIOUSLY REPORTED AS 24    Lymphocytes Relative 39 %    Comment: CORRECTED ON 09/22 AT [id number]: PREVIOUSLY REPORTED AS 40   Monocytes Relative 6 %    Comment: CORRECTED ON 09/22 AT [id number]: PREVIOUSLY REPORTED AS 27   Eosinophils Relative 0 %    Comment: CORRECTED ON 09/22 AT [id number]: PREVIOUSLY REPORTED AS 9   Basophils Relative 0 %   Band Neutrophils 4 %    Comment: CORRECTED ON 09/22 AT [id number]: PREVIOUSLY REPORTED AS 0   Metamyelocytes Relative 0 %   Myelocytes 0 %   Promyelocytes Absolute 0 %   Blasts 0 %   nRBC 0 0 /100 WBC   Other 0 %   Neutro Abs 0.5 (L) 1.4 - 6.5 K/uL    Comment: CORRECTED ON 09/22 AT [id number]: PREVIOUSLY REPORTED AS 0.2   Lymphs Abs 0.4 (L) 1.0 - 3.6 K/uL   Monocytes Absolute 0.1 (L) 0.2 - 0.9 K/uL    Comment: CORRECTED ON 09/22 AT [id number]: PREVIOUSLY REPORTED AS 0.3   Eosinophils Absolute 0.0 0 - 0.7 K/uL    Comment: CORRECTED ON 09/22 AT [id number]: PREVIOUSLY REPORTED AS 0.1   Basophils Absolute 0.0 0 -  0.1 K/uL   RBC Morphology MIXED RBC POPULATION   Type and screen [place]     Status: None   Collection Time: 01/03/16  3:36 AM  Result Value Ref Range   ABO/RH(D) O POS    Antibody Screen NEG    Sample Expiration 01/06/2016   Glucose, capillary     Status: None   Collection Time: 01/03/16  7:48 AM  Result Value Ref Range   Glucose-Capillary 98 65 - 99 mg/dL  Glucose, capillary     Status: Abnormal   Collection Time: 01/03/16  11:58 AM  Result Value Ref Range   Glucose-Capillary 109 (H) 65 - 99 mg/dL  Glucose, capillary     Status: Abnormal   Collection Time: 01/03/16  4:17 PM  Result Value Ref Range   Glucose-Capillary 131 (H) 65 - 99 mg/dL  Glucose, capillary     Status: Abnormal   Collection Time: 01/03/16  9:48 PM  Result Value Ref Range   Glucose-Capillary 153 (H) 65 - 99 mg/dL   Comment 1 Notify RN    Comment 2 Document in Chart    Dg Chest 1 View  Result Date: 01/02/2016 CLINICAL DATA:  Fatigue.  Hypertension EXAM: CHEST 1 VIEW COMPARISON:  December 31, 2015 FINDINGS: Port-A-Cath tip is in the right atrium. No pneumothorax. No edema or consolidation. Heart is borderline enlarged with pulmonary vascularity within normal limits. No adenopathy. There is atherosclerotic calcification in the aorta. No bone lesions. IMPRESSION: Port-A-Cath position unchanged. No pneumothorax. Stable cardiac silhouette. Aortic atherosclerosis. Electronically Signed   By: Lowella Grip III M.D.   On: 01/02/2016 14:38    Assessment:  ANALYCE DILAURA is a 70 y.o. female with metastatic colon cancer currently day #12 s/p cycle #3 FOLFIRI (dose reduced) admitted with altered mental status, fever and neutropenia.  At initial presentation, patient was confused (did not remember events precipitating admission).  She was pan-cultured and started on broad spectrum antibiotics (cefepime and vancomycin).  Cultures to-date are negative to date.  She is slowly returning back to her  baseline mentation.  She experienced diarrhea with her chemotherapy (irinotecan).  Per patient, one loose stool today. C diff negative.  Plan: 1.  Oncology:  Day 12 s/p cycle #3 FOLFIRI (dose reduced) followed by Neulasta post chemotherapy.  Despite dose reduction, patient developed recurrent neutropenia.  Check CBC with diff daily.  No benefit of added daily GCSF given full dose Neulasta at disconnect from pump on day 3.  Slight improvement in counts today.   ANC 500.  Platelet count low secondary to chemotherapy induced myelosuppression and consumption from infection.  No bleeding.  Monitor daily.  No Lovenox for DVT prophylaxis.  Hematocrit remains low, but stable.  Anemia likely due to chemotherapy induced anemia.  Maintain active type and screen.  May require transfusion.    2.  Infectious disease:  No fever.  Cultures negative to date.  Currently on Cefepime.  Stool studies to be sent.  3.  Cardiovascular:  Atrial fibrillation with RVR on amiodarone. Patient in telemetry bed.     Lequita Asal, MD  01/03/2016

## 2016-01-05 ENCOUNTER — Other Ambulatory Visit: Payer: Self-pay | Admitting: Hematology and Oncology

## 2016-01-05 DIAGNOSIS — C189 Malignant neoplasm of colon, unspecified: Secondary | ICD-10-CM

## 2016-01-05 DIAGNOSIS — C787 Secondary malignant neoplasm of liver and intrahepatic bile duct: Principal | ICD-10-CM

## 2016-01-05 LAB — CBC WITH DIFFERENTIAL/PLATELET
Band Neutrophils: 11 %
Basophils Absolute: 0 10*3/uL (ref 0–0.1)
Basophils Relative: 0 %
Blasts: 0 %
Eosinophils Absolute: 0 10*3/uL (ref 0–0.7)
Eosinophils Relative: 0 %
HCT: 23.5 % — ABNORMAL LOW (ref 35.0–47.0)
Hemoglobin: 8.2 g/dL — ABNORMAL LOW (ref 12.0–16.0)
Lymphocytes Relative: 13 %
Lymphs Abs: 1 10*3/uL (ref 1.0–3.6)
MCH: 31.3 pg (ref 26.0–34.0)
MCHC: 34.9 g/dL (ref 32.0–36.0)
MCV: 89.6 fL (ref 80.0–100.0)
Metamyelocytes Relative: 2 %
Monocytes Absolute: 0.3 10*3/uL (ref 0.2–0.9)
Monocytes Relative: 4 %
Myelocytes: 0 %
Neutro Abs: 6.2 10*3/uL (ref 1.4–6.5)
Neutrophils Relative %: 70 %
Other: 0 %
Platelets: 107 10*3/uL — ABNORMAL LOW (ref 150–440)
Promyelocytes Absolute: 0 %
RBC: 2.62 MIL/uL — ABNORMAL LOW (ref 3.80–5.20)
RDW: 16.9 % — ABNORMAL HIGH (ref 11.5–14.5)
WBC: 7.5 10*3/uL (ref 3.6–11.0)
nRBC: 1 /100 WBC — ABNORMAL HIGH

## 2016-01-05 LAB — CULTURE, BLOOD (ROUTINE X 2)
CULTURE: NO GROWTH
Culture: NO GROWTH

## 2016-01-05 LAB — GLUCOSE, CAPILLARY
GLUCOSE-CAPILLARY: 90 mg/dL (ref 65–99)
GLUCOSE-CAPILLARY: 107 mg/dL — AB (ref 65–99)

## 2016-01-05 MED ORDER — AMIODARONE HCL 400 MG PO TABS: 200.0000 mg | ORAL_TABLET | Freq: Two times a day (BID) | ORAL | 0 refills | Status: AC

## 2016-01-05 MED ORDER — LEVOFLOXACIN 500 MG PO TABS: 500.0000 mg | ORAL_TABLET | Freq: Every day | ORAL | 0 refills | Status: DC

## 2016-01-05 MED ORDER — METOPROLOL TARTRATE 25 MG PO TABS: 25.0000 mg | ORAL_TABLET | Freq: Two times a day (BID) | ORAL | 0 refills | Status: DC

## 2016-01-05 NOTE — Discharge Summary (Signed)
Sandy [name], 70 y.o., DOB 09/28/1945, MRN [id number]. Admission date: 12/31/2015 Discharge Date 01/05/2016 Primary MD Harriett Neita Carp, MD Admitting Physician Theodoro Grist, MD  Admission Diagnosis  Neutropenic fever ([place]) [D70.9, R50.81] Pancytopenia ([place]) [D61.818] Sepsis, due to unspecified organism ([place]) [A41.9] Diarrhea, unspecified type [R19.7]  Discharge Diagnosis   Active Problems:   Neutropenic fever ([facility])   Lactic acidosis   Acute encephalopathy   Diarrhea   Pancytopenia ([place])   Colon cancer with liver metastases   Essential hypertension   Neuropathy   Sickle cell anemia        Hospital Course patient 70 year old female with known history of metastatic colon cancer who was brought to the hospital with decrease in responsiveness. Patient was seen by her primary oncologist and was noted to have WBC count that was low. In the ER she was noted to have elevated lactic acid level. Consistent with possible infectious source. She also was noted to have a temperature 101.5. She was admitted for neutropenic fever. Source for her fever was not identified her blood cultures have remained negative urine analysis showed no evidence infection chest x-ray no pneumonia. Patient was kept on anabiotic's with IV. Her WBC count has now normalized. She has no longer fevers. She was seen in consultation by her oncologist. Patient also had a diarrhea workup including C. difficile was negative. Her diarrhea has since resolved. Patient is currently back to her baseline. She was DO NOT RESUSCITATE then Dr. Leslye Peer  asked her daughter about CODE STATUS and she stated that her mother was full code. So this has been changed this needs to be readdressed with her primary oncologist. Patient currently at her baseline and stable for discharge.            Dg Chest 1 View  Result Date: 01/02/2016 CLINICAL DATA:  Fatigue.  Hypertension EXAM: CHEST 1 VIEW COMPARISON:  December 31, 2015 FINDINGS:  Port-A-Cath tip is in the right atrium. No pneumothorax. No edema or consolidation. Heart is borderline enlarged with pulmonary vascularity within normal limits. No adenopathy. There is atherosclerotic calcification in the aorta. No bone lesions. IMPRESSION: Port-A-Cath position unchanged. No pneumothorax. Stable cardiac silhouette. Aortic atherosclerosis. Electronically Signed   By: Lowella Grip III M.D.   On: 01/02/2016 14:38   Dg Chest 2 View  Result Date: 12/31/2015 CLINICAL DATA:  Fever since this morning. History of sickle cell anemia. EXAM: CHEST  2 VIEW COMPARISON:  Chest x-ray 11/09/2015 FINDINGS: The heart is mildly enlarged but stable. The power port is stable. The tip is in the right atrium. The lungs are clear. No pleural effusion. The bony thorax is intact. IMPRESSION: Mild stable cardiac enlargement. No infiltrates, edema or effusions. Electronically Signed   By: Marijo Sanes M.D.   On: 12/31/2015 18:15   Ct Head Wo Contrast  Result Date: 12/31/2015 CLINICAL DATA:  Acute onset of altered mental status. Decreased white blood cell count. Diarrhea. Initial encounter. EXAM: CT HEAD WITHOUT CONTRAST TECHNIQUE: Contiguous axial images were obtained from the base of the skull through the vertex without intravenous contrast. COMPARISON:  CT of the head performed 11/06/2015, and MRI of the brain performed 11/09/2015 FINDINGS: Brain: No evidence of acute infarction, hemorrhage, hydrocephalus, extra-axial collection or mass lesion/mass effect. Prominence of the ventricles and sulci reflects mild cortical volume loss. Scattered periventricular and subcortical white matter change likely reflects small vessel ischemic microangiopathy. The brainstem and fourth ventricle are within normal limits. The basal ganglia are unremarkable in appearance. The cerebral hemispheres  demonstrate grossly normal gray-white differentiation. No mass effect or midline shift is seen. Vascular: No hyperdense vessel or  unexpected calcification. Skull: There is no evidence of fracture; visualized osseous structures are unremarkable in appearance. Sinuses/Orbits: The visualized portions of the orbits are within normal limits. There is opacification of the left mastoid air cells. The paranasal sinuses and right mastoid air cells are well-aerated. Other: No significant soft tissue abnormalities are seen. IMPRESSION: 1. No acute intracranial pathology seen on CT. 2. Mild cortical volume loss and scattered small vessel ischemic microangiopathy. 3. Opacification of the left mastoid air cells. Electronically Signed   By: Garald Balding M.D.   On: 12/31/2015 18:50       Today   Subjective:   Sandy [name] patient feels well back to her baseline has abdominal pain related to her colon cancer  Objective:   Blood pressure (!) 155/68, pulse 60, temperature 98.7 F (37.1 C), temperature source Oral, resp. rate 18, height 5\' 5"  (1.651 m), weight 148 lb 1.6 oz (67.2 kg), SpO2 100 %.  .  Intake/Output Summary (Last 24 hours) at 01/05/16 1214 Last data filed at 01/04/16 1317  Gross per 24 hour  Intake                0 ml  Output                0 ml  Net                0 ml    Exam VITAL SIGNS: Blood pressure (!) 155/68, pulse 60, temperature 98.7 F (37.1 C), temperature source Oral, resp. rate 18, height 5\' 5"  (1.651 m), weight 148 lb 1.6 oz (67.2 kg), SpO2 100 %.  GENERAL:  70 y.o.-year-old patient lying in the bed with no acute distress.  EYES: Pupils equal, round, reactive to light and accommodation. No scleral icterus. Extraocular muscles intact.  HEENT: Head atraumatic, normocephalic. Oropharynx and nasopharynx clear.  NECK:  Supple, no jugular venous distention. No thyroid enlargement, no tenderness.  LUNGS: Normal breath sounds bilaterally, no wheezing, rales,rhonchi or crepitation. No use of accessory muscles of respiration.  CARDIOVASCULAR: S1, S2 normal. No murmurs, rubs, or gallops.  ABDOMEN: Soft,  nontender, nondistended. Bowel sounds present. No organomegaly or mass.  EXTREMITIES: No pedal edema, cyanosis, or clubbing.  NEUROLOGIC: Cranial nerves II through XII are intact. Muscle strength 5/5 in all extremities. Sensation intact. Gait not checked.  PSYCHIATRIC: The patient is alert and oriented x 3.  SKIN: No obvious rash, lesion, or ulcer.   Data Review     CBC w Diff: Lab Results  Component Value Date   WBC 7.5 01/05/2016   HGB 8.2 (L) 01/05/2016   HGB 15.5 09/23/2011   HCT 23.5 (L) 01/05/2016   HCT 32.0 (L) 06/11/2015   PLT 107 (L) 01/05/2016   PLT 192 06/11/2015   LYMPHOPCT 13 01/05/2016   BANDSPCT 11 01/05/2016   MONOPCT 4 01/05/2016   EOSPCT 0 01/05/2016   BASOPCT 0 01/05/2016   CMP: Lab Results  Component Value Date   NA 142 01/01/2016   NA 141 09/23/2011   K 3.9 01/01/2016   K 3.6 09/23/2011   CL 118 (H) 01/01/2016   CL 104 09/23/2011   CO2 19 (L) 01/01/2016   CO2 30 09/23/2011   BUN 7 01/01/2016   BUN 16 09/23/2011   CREATININE 0.80 01/01/2016   CREATININE 0.63 09/23/2011   PROT 5.9 (L) 12/31/2015   PROT 7.7 09/23/2011  ALBUMIN 2.6 (L) 12/31/2015   ALBUMIN 4.0 09/23/2011   BILITOT 0.7 12/31/2015   BILITOT 0.4 09/23/2011   ALKPHOS 98 12/31/2015   ALKPHOS 156 (H) 09/23/2011   AST 16 12/31/2015   AST 13 (L) 09/23/2011   ALT 9 (L) 12/31/2015   ALT 20 09/23/2011  .  Micro Results Recent Results (from the past 240 hour(s))  Culture, blood (Routine X 2)     Status: None   Collection Time: 12/31/15  5:51 PM  Result Value Ref Range Status   Specimen Description BLOOD  LEFT AC  Final   Special Requests   Final    BOTTLES DRAWN AEROBIC AND ANAEROBIC  AER 14CC ANA 14CC   Culture NO GROWTH 5 DAYS  Final   Report Status 01/05/2016 FINAL  Final  Urine culture     Status: None   Collection Time: 12/31/15  6:24 PM  Result Value Ref Range Status   Specimen Description URINE, RANDOM  Final   Special Requests NONE  Final   Culture NO GROWTH Performed  at [facility]   Final   Report Status 01/02/2016 FINAL  Final  Culture, blood (Routine X 2)     Status: None   Collection Time: 12/31/15  8:38 PM  Result Value Ref Range Status   Specimen Description BLOOD RIGHT HAND  Final   Special Requests BOTTLES DRAWN AEROBIC AND ANAEROBIC 5CC  Final   Culture NO GROWTH 5 DAYS  Final   Report Status 01/05/2016 FINAL  Final  C difficile quick scan w PCR reflex     Status: None   Collection Time: 01/01/16 12:21 PM  Result Value Ref Range Status   C Diff antigen NEGATIVE NEGATIVE Final   C Diff toxin NEGATIVE NEGATIVE Final   C Diff interpretation No C. difficile detected.  Final        Code Status Orders        Start     Ordered   12/31/15 2300  Full code  Continuous     12/31/15 2259    Code Status History    Date Active Date Inactive Code Status Order ID Comments User Context   11/11/2015  8:03 AM 11/12/2015  8:20 PM DNR [id number]  Hillary Bow, MD Inpatient   11/08/2015  2:54 PM 11/08/2015  5:44 PM DNR [id number]  Awilda Bill, NP Inpatient   11/07/2015  6:17 PM 11/08/2015  2:54 PM Full Code [id number]  Vaughan Basta, MD Inpatient   08/28/2014  4:37 PM 08/29/2014  8:42 PM Full Code [id number]  Theodoro Grist, MD Inpatient    Advance Directive Documentation   Flowsheet Row Most Recent Value  Type of Advance Directive  Healthcare Power of Attorney  Pre-existing out of facility DNR order (yellow form or pink MOST form)  No data  "MOST" Form in Place?  No data            Discharge Medications     Medication List    STOP taking these medications   docusate sodium 100 MG capsule Commonly known as:  COLACE     TAKE these medications   amiodarone 100 MG tablet Commonly known as:  PACERONE Take 100 mg by mouth daily. What changed:  Another medication with the same name was added. Make sure you understand how and when to take each.   amiodarone 400 MG tablet Commonly known as:  PACERONE Take 0.5 tablets (200 mg  total) by mouth 2 (two) times daily. What changed:  You were already taking a medication with the same name, and this prescription was added. Make sure you understand how and when to take each.   aspirin EC 81 MG tablet Take 81 mg by mouth daily. Reported on 08/26/2015   atorvastatin 40 MG tablet Commonly known as:  LIPITOR Take 40 mg by mouth daily.   diphenoxylate-atropine 2.5-0.025 MG tablet Commonly known as:  LOMOTIL 1-2 tablets every 6 hours as needed for diarrhea   feeding supplement (ENSURE ENLIVE) Liqd Take 237 mLs by mouth 2 (two) times daily between meals.   gabapentin 100 MG capsule Commonly known as:  NEURONTIN Take 2 capsules (200 mg total) by mouth at bedtime. 1 tab daily and 2 tabs at bedtime. What changed:  additional instructions   levofloxacin 500 MG tablet Commonly known as:  LEVAQUIN Take 1 tablet (500 mg total) by mouth daily.   lidocaine-prilocaine cream Commonly known as:  EMLA Apply 1 application topically as needed. Apply to port 1 hour prior to chemotherapy appointment. Cover with plastic wrap.   lisinopril 10 MG tablet Commonly known as:  PRINIVIL,ZESTRIL Take 10 mg by mouth daily.   magnesium 30 MG tablet Take 30 mg by mouth daily.   megestrol 400 MG/10ML suspension Commonly known as:  MEGACE Take 5 mLs (200 mg total) by mouth daily. to stimulate appetite   metFORMIN 1000 MG tablet Commonly known as:  GLUCOPHAGE Take 1,000 mg by mouth 2 (two) times daily.   metoprolol tartrate 25 MG tablet Commonly known as:  LOPRESSOR Take 1 tablet (25 mg total) by mouth 2 (two) times daily.   multivitamin with minerals Tabs tablet Take 1 tablet by mouth daily.   ondansetron 4 MG tablet Commonly known as:  ZOFRAN Take 1 tablet (4 mg total) by mouth every 8 (eight) hours as needed for nausea or vomiting.   oxyCODONE 5 MG immediate release tablet Commonly known as:  Oxy IR/ROXICODONE Take 1 tablet (5 mg total) by mouth every 6 (six) hours as needed  for severe pain.   traZODone 50 MG tablet Commonly known as:  DESYREL Take 0.5 tablets (25 mg total) by mouth at bedtime as needed for sleep.          Total Time in preparing paper work, data evaluation and todays exam - 35 minutes  Dustin Flock M.D on 01/05/2016 at 12:14 PM  [facility]   Office  [phone]

## 2016-01-05 NOTE — Progress Notes (Signed)
Patient discharged home as ordered,priscriptions given to daughter,transported by EMS

## 2016-01-05 NOTE — Clinical Social Work Note (Signed)
Patient declined SNF when CSW visited bedside. RNCM notified to restart HH.  Santiago Bumpers, MSW, LCSW-A [phone]

## 2016-01-05 NOTE — Progress Notes (Signed)
[facility] Hematology/Oncology Progress Note  Date of admission: 12/31/2015  Hospital day:  01/05/2016   Chief Complaint: Sandy [name] is a 70 y.o. female with stage IV colon cancer who was admitted with altered mental status, fever and neutropenia.  Subjective:  Patient feels better today.  Ready to go home.  No diarrhea.  Eating well.  Little abdominal discomfort (overall improved).  Social History: The patient is alone today.   Allergies:  Allergies  Allergen Reactions  . Tylenol [Acetaminophen] Nausea And Vomiting    Scheduled Medications: . amiodarone  400 mg Oral Daily  . atorvastatin  40 mg Oral Daily  . feeding supplement  1 Container Oral TID BM  . gabapentin  100 mg Oral q morning - 10a   And  . gabapentin  200 mg Oral QHS  . insulin aspart  0-9 Units Subcutaneous TID WC  . levofloxacin  500 mg Oral Daily  . metoprolol tartrate  25 mg Oral BID    Review of Systems: GENERAL: Feeling much better.  Fatigue. Afebrile.No sweats. PERFORMANCE STATUS (ECOG): 2. HEENT: No visual changes, runny nose, sore throat, mouth sores or tenderness. Lungs: No shortness of breath or cough. No hemoptysis. Cardiac: No chest pain, palpitations, orthopnea, or PND. GI: No diarrhea.  Rare abdominal cramping.  No nausea, vomiting, diarrhea, melena or hematochezia. GU: No dysuria or hematuria. Musculoskeletal: Denies back pain. No joint pain. No muscle tenderness. Extremities: No pain or swelling. 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 No headache, numbness or weakness, or balance issues. Endocrine: Diabetes. No thyroid issues, hot flashes or night sweats. Psych: No mood changes or anxiety. Pain: Denies pain. Review of systems: All other systems reviewed and found to be negative.  Physical Exam: Blood pressure (!) 148/66, pulse 60, temperature 98.6 F (37 C), temperature source  Oral, resp. rate 16, height 5\' 5"  (1.651 m), weight 148 lb 1.6 oz (67.2 kg), SpO2 99 %.  GENERAL: Elderly woman sitting up in bed on the medical unit in no acute distress. MENTAL STATUS: Alert and oriented to person, place and time. HEAD: Normocephalic, atraumatic, face symmetric, no Cushingoid features. EYES: Wearing glasses.  Hazel/green eyes. Pupils equal round and reactive to light and accomodation. No conjunctivitis or scleral icterus. ENT: Oropharynx clear without lesion. Missing several teeth. Tongue normal. Mucous membranes moist.  RESPIRATORY: Clear to auscultation without rales, wheezes or rhonchi. CARDIOVASCULAR: Regular rate and rhythm without murmur, rub or gallop. ABDOMEN: Soft, minimally tender in the mid lower quadrant.  No guarding or rebound tenderness.  Active bowel sounds. No hepatosplenomegaly. SKIN: No rashes, ulcer or skin lesions.  EXTREMITIES: No edema, skin discoloration or tenderness. No palpable cords. NEUROLOGICAL: Appropriate.  Conversant. PSYCH: Affect normal.   Results for orders placed or performed during the hospital encounter of 12/31/15 (from the past 48 hour(s))  Glucose, capillary     Status: Abnormal   Collection Time: 01/03/16 11:58 AM  Result Value Ref Range   Glucose-Capillary 109 (H) 65 - 99 mg/dL  Glucose, capillary     Status: Abnormal   Collection Time: 01/03/16  4:17 PM  Result Value Ref Range   Glucose-Capillary 131 (H) 65 - 99 mg/dL  Glucose, capillary     Status: Abnormal   Collection Time: 01/03/16  9:48 PM  Result Value Ref Range   Glucose-Capillary 153 (H) 65 - 99 mg/dL   Comment 1 Notify RN    Comment 2 Document in  Chart   Glucose, capillary     Status: None   Collection Time: 01/04/16  6:04 AM  Result Value Ref Range   Glucose-Capillary 97 65 - 99 mg/dL   Comment 1 Notify RN    Comment 2 Document in Chart   CBC with Differential     Status: Abnormal   Collection Time: 01/04/16  6:11 AM  Result Value Ref Range    WBC 3.4 (L) 3.6 - 11.0 K/uL   RBC 2.64 (L) 3.80 - 5.20 MIL/uL   Hemoglobin 8.3 (L) 12.0 - 16.0 g/dL   HCT 24.5 (L) 35.0 - 47.0 %   MCV 92.8 80.0 - 100.0 fL   MCH 31.2 26.0 - 34.0 pg   MCHC 33.7 32.0 - 36.0 g/dL   RDW 17.0 (H) 11.5 - 14.5 %   Platelets 73 (L) 150 - 440 K/uL   Neutrophils Relative % 77 %   Lymphocytes Relative 17 %   Monocytes Relative 5 %   Eosinophils Relative 1 %   Basophils Relative 0 %   Neutro Abs 2.6 1.4 - 6.5 K/uL   Lymphs Abs 0.6 (L) 1.0 - 3.6 K/uL   Monocytes Absolute 0.2 0.2 - 0.9 K/uL   Eosinophils Absolute 0.0 0 - 0.7 K/uL   Basophils Absolute 0.0 0 - 0.1 K/uL  Glucose, capillary     Status: None   Collection Time: 01/04/16  7:50 AM  Result Value Ref Range   Glucose-Capillary 88 65 - 99 mg/dL   Comment 1 Notify RN    Comment 2 Document in Chart   Glucose, capillary     Status: None   Collection Time: 01/04/16 11:47 AM  Result Value Ref Range   Glucose-Capillary 87 65 - 99 mg/dL   Comment 1 Notify RN    Comment 2 Document in Chart   Glucose, capillary     Status: None   Collection Time: 01/04/16  4:47 PM  Result Value Ref Range   Glucose-Capillary 73 65 - 99 mg/dL   Comment 1 Notify RN    Comment 2 Document in Chart   Glucose, capillary     Status: Abnormal   Collection Time: 01/04/16  8:57 PM  Result Value Ref Range   Glucose-Capillary 112 (H) 65 - 99 mg/dL  CBC with Differential     Status: Abnormal   Collection Time: 01/05/16  4:39 AM  Result Value Ref Range   WBC 7.5 3.6 - 11.0 K/uL   RBC 2.62 (L) 3.80 - 5.20 MIL/uL   Hemoglobin 8.2 (L) 12.0 - 16.0 g/dL   HCT 23.5 (L) 35.0 - 47.0 %   MCV 89.6 80.0 - 100.0 fL   MCH 31.3 26.0 - 34.0 pg   MCHC 34.9 32.0 - 36.0 g/dL   RDW 16.9 (H) 11.5 - 14.5 %   Platelets 107 (L) 150 - 440 K/uL    Comment: COUNT MAY BE INACCURATE DUE TO FIBRIN CLUMPS.   Neutrophils Relative % 70 %   Lymphocytes Relative 13 %   Monocytes Relative 4 %   Eosinophils Relative 0 %   Basophils Relative 0 %   Band  Neutrophils 11 %   Metamyelocytes Relative 2 %   Myelocytes 0 %   Promyelocytes Absolute 0 %   Blasts 0 %   nRBC 1 (H) 0 /100 WBC   Other 0 %   Neutro Abs 6.2 1.4 - 6.5 K/uL   Lymphs Abs 1.0 1.0 - 3.6 K/uL   Monocytes Absolute 0.3 0.2 -  0.9 K/uL   Eosinophils Absolute 0.0 0 - 0.7 K/uL   Basophils Absolute 0.0 0 - 0.1 K/uL   Smear Review LARGE PLATELETS PRESENT   Glucose, capillary     Status: None   Collection Time: 01/05/16  7:57 AM  Result Value Ref Range   Glucose-Capillary 90 65 - 99 mg/dL   No results found.  Assessment:  Sandy [name] is a 70 y.o. female with metastatic colon cancer currently day #14 s/p cycle #3 FOLFIRI (dose reduced) admitted with altered mental status, fever and neutropenia.  At initial presentation, patient was confused (did not remember events precipitating admission).  She was pan-cultured and started on broad spectrum antibiotics (cefepime and vancomycin).  Cultures to-date are negative to date.  She is slowly returning back to her baseline mentation.  She experienced diarrhea with her chemotherapy (irinotecan). C diff negative.  Plan: 1.  Oncology:  Day 14 s/p cycle #3 FOLFIRI (dose reduced) followed by Neulasta post chemotherapy.  Despite dose reduction, patient developed recurrent neutropenia.  Counts recovered.  Discuss plan for chemotherapy in 1 week to allow patient further recovery.  Plan to return to clinic on 01/13/2016.  My nurse to contact her doctor tomorrow to schedule follow-up.   2.  Infectious disease:  No fever.  Cultures negative to date.  Stool studies negative.  3.  Cardiovascular:  Normal sinus rhythm.  History of atrial fibrillation with RVR on amiodarone.  4.  Disposition:  Anticipate discharge home today.   Lequita Asal, MD  01/05/2016, 11:26 AM

## 2016-01-05 NOTE — Care Management Note (Addendum)
Case Management Note  Patient Details  Name: Sandy [name] MRN: [id number] Date of Birth: 23-Apr-1945  Subjective/Objective:     Referral for HH=PT, RN, SW faxed and called to [facility] today. [name] is already an open client of [facility] and PT has been added to her home health services. EMS transport home for safety.                Action/Plan:   Expected Discharge Date:                  Expected Discharge Plan:     In-House Referral:     Discharge planning Services     Post Acute Care Choice:    Choice offered to:     DME Arranged:    DME Agency:     HH Arranged:    HH Agency:     Status of Service:     If discussed at [employer] of Stay Meetings, dates discussed:    Additional Comments:  Bearl Talarico A, RN 01/05/2016, 10:31 AM

## 2016-01-05 NOTE — Progress Notes (Deleted)
[facility] day:  01/05/2016   Chief Complaint: Sandy [name] is a 70 y.o. female with stage IV colon cancer who is seen for assessment following interval hospitalization.  HPI: The patient was last seen in the medical oncology clinic on 12/23/2015.  At that time, she was doing well.  She received cycle #3 FOLFIRI.  she returned on 12/25/2015 for Neulasta.  She was admitted to [facility] from 12/31/2015 - 01/05/2016 with fever and neutropenia.  She presented with altered mental status.  Lactic aid was elevated.  She was treated with Cefepime.  All cultures remained negative.  Stool was negative for C difficile.  While hospitalized she developed atrial fibrillation.  Her amiodarone was increased.  Nadir ANC was 100 on 12/31/2015. Counts at discharge included a hematocrit of 23.5, hemoglobin 8.2, platelets 107,000, WBC 7500 with an ANC of 6200.  Past Medical History:  Diagnosis Date  . Cancer ([place])    liver mets; colon cancer  . Constipation   . Diabetes mellitus without complication ([place])   . HOH (hard of hearing)   . Hypertension   . Insomnia   . 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 Sickle cell anemia ([facility])   . Wears dentures    partial upper and lower    Past Surgical History:  Procedure Laterality Date  . CARDIAC CATHETERIZATION    . COLONOSCOPY WITH PROPOFOL N/A 09/07/2014   Procedure: COLONOSCOPY WITH PROPOFOL;  Surgeon: Lucilla Lame, MD;  Location: [facility] ENDOSCOPY;  Service: Endoscopy;  Laterality: N/A;  . ESOPHAGOGASTRODUODENOSCOPY N/A 09/07/2014   Procedure: ESOPHAGOGASTRODUODENOSCOPY (EGD);  Surgeon: Lucilla Lame, MD;  Location: [facility] ENDOSCOPY;  Service: Endoscopy;  Laterality: N/A;  . FLEXIBLE SIGMOIDOSCOPY N/A 09/02/2015   Procedure: FLEXIBLE SIGMOIDOSCOPY;  Surgeon: Lucilla Lame, MD;  Location: [facility];  Service: Endoscopy;  Laterality: N/A;  Diabetic - oral meds Pt has port-a-cath  . LIVER BIOPSY    .  PORTACATH PLACEMENT Left 09/24/2014   Procedure: INSERTION PORT-A-CATH;  Surgeon: Robert Bellow, MD;  Location: [facility] ORS;  Service: General;  Laterality: Left;    Family History  Problem Relation Age of Onset  . Cancer Sister     Social History:  reports that she quit smoking about 2 years ago. Her smoking use included Cigarettes. She has a 5.00 pack-year smoking history. She has never used smokeless tobacco. She reports that she does not drink alcohol or use drugs.  The patient has recently been a resident of [facility].  The patient is accompanied by her daughter, [name].  The patient's medical power of attorney is her daughter.   Allergies:  Allergies  Allergen Reactions  . Tylenol [Acetaminophen] Nausea And Vomiting    Current Medications: Current Outpatient Prescriptions  Medication Sig Dispense Refill  . amiodarone (PACERONE) 100 MG tablet Take 100 mg by mouth daily.    Marland Kitchen amiodarone (PACERONE) 400 MG tablet Take 0.5 tablets (200 mg total) by mouth 2 (two) times daily. 30 tablet 0  . aspirin EC 81 MG tablet Take 81 mg by mouth daily. Reported on 08/26/2015    . atorvastatin (LIPITOR) 40 MG tablet Take 40 mg by mouth daily.    . diphenoxylate-atropine (LOMOTIL) 2.5-0.025 MG tablet 1-2 tablets every 6 hours as needed for diarrhea 40 tablet 1  . feeding supplement, ENSURE ENLIVE, (ENSURE ENLIVE) LIQD Take 237 mLs by mouth 2 (two) times daily between meals. (Patient not taking: Reported  on 12/31/2015) 237 mL 12  . gabapentin (NEURONTIN) 100 MG capsule Take 2 capsules (200 mg total) by mouth at bedtime. 1 tab daily and 2 tabs at bedtime. (Patient taking differently: Take 200 mg by mouth at bedtime. 1 tab in the morning daily and 2 capsules at bedtime.) 60 capsule 0  . levofloxacin (LEVAQUIN) 500 MG tablet Take 1 tablet (500 mg total) by mouth daily. 4 tablet 0  . lidocaine-prilocaine (EMLA) cream Apply 1 application topically as needed. Apply to port 1 hour prior to chemotherapy  appointment. Cover with plastic wrap. 30 g 1  . lisinopril (PRINIVIL,ZESTRIL) 10 MG tablet Take 10 mg by mouth daily.    . magnesium 30 MG tablet Take 30 mg by mouth daily.     . megestrol (MEGACE) 400 MG/10ML suspension Take 5 mLs (200 mg total) by mouth daily. to stimulate appetite 240 mL 0  . metFORMIN (GLUCOPHAGE) 1000 MG tablet Take 1,000 mg by mouth 2 (two) times daily.     . metoprolol tartrate (LOPRESSOR) 25 MG tablet Take 1 tablet (25 mg total) by mouth 2 (two) times daily. 60 tablet 0  . Multiple Vitamin (MULTIVITAMIN WITH MINERALS) TABS tablet Take 1 tablet by mouth daily.    . ondansetron (ZOFRAN) 4 MG tablet Take 1 tablet (4 mg total) by mouth every 8 (eight) hours as needed for nausea or vomiting. 90 tablet 2  . oxyCODONE (OXY IR/ROXICODONE) 5 MG immediate release tablet Take 1 tablet (5 mg total) by mouth every 6 (six) hours as needed for severe pain. 30 tablet 0  . traZODone (DESYREL) 50 MG tablet Take 0.5 tablets (25 mg total) by mouth at bedtime as needed for sleep. 20 tablet 0   No current facility-administered medications for this visit.    Facility-Administered Medications Ordered in Other Visits  Medication Dose Route Frequency Provider Last Rate Last Dose  . acetaminophen (TYLENOL) tablet 650 mg  650 mg Oral Once Lequita Asal, MD      . sodium chloride 0.9 % injection 10 mL  10 mL Intracatheter PRN Lequita Asal, MD   10 mL at 10/11/14 1431  . sodium chloride 0.9 % injection 10 mL  10 mL Intracatheter PRN Lequita Asal, MD   10 mL at 02/28/15 1529    Review of Systems:  GENERAL: Feels good.  No fevers.  Weight down 5 pounds. PERFORMANCE STATUS (ECOG): 2. HEENT: Wears a hearing aide. No visual changes, runny nose, sore throat, mouth sores or tenderness. Lungs:  No shortness of breath or cough. No hemoptysis. Cardiac: Heart rate slow.  No chest pain, palpitations, orthopnea, or PND. GI: Eating good.  Loose stools on Boost.  No nausea, vomiting,  diarrhea, melena or hematochezia. GU: No dysuria or hematuria. Musculoskeletal: Denies back pain. No joint pain. No muscle tenderness. Extremities: No pain or swelling. Skin: No rashes, skin lesions, or ulcers Neuro: No headache, numbness or weakness, or balance issues. Endocrine: Diabetes. No thyroid issues, hot flashes or night sweats. Psych: No mood changes or anxiety. Pain: Takes oxycodone in the AM. Review of systems: All other systems   Physical Exam: There were no vitals taken for this visit. GENERAL: Elderly woman sitting comfortably in a wheelchair in no acute distress. MENTAL STATUS: Alert and oriented to person, place and time. HEAD: Wearing a black wrap.  Alopecia totalis. Normocephalic, atraumatic, face symmetric, no Cushingoid features. EYES: Black rimmed glasses. Hazel/green eyes. Pupils equal round and reactive to light and accomodation. No conjunctivitis or  scleral icterus. ENT: Oropharynx clear without lesion. Missing several teeth. Tongue normal. Mucous membranes moist.  RESPIRATORY: Clear to auscultation without rales, wheezes or rhonchi. CARDIOVASCULAR: Regular rate and rhythm without murmur, rub or gallop. ABDOMEN: Soft, non-tender with active bowel sounds. No guarding or rebound tenderness. No hepatosplenomegaly. SKIN: No rashes, ulcer or skin lesions.  EXTREMITIES: No edema, no skin discoloration or tenderness. No palpable cords. LYMPH NODES: Right low anterior cervical 3 cm soft mass (thyroid). No palpable supraclavicular, axillary or inguinal adenopathy  NEUROLOGICAL: Appropriate. PSYCH: Affect normal.  Admission on 12/31/2015, Discharged on 01/05/2016  No results displayed because visit has over 200 results.      Assessment:  Sandy [name] is a 70 y.o. female with metastatic colon cancer. She presented with a 53 pound weight loss in 6 months. Colonoscopy on 09/07/2014 revealed a circumferential partially obstructive  fungating mass in the descending colon. There was no active bleeding. There was a 1.5 cm pedunculated polyp in the sigmoid colon (tubulovillous adenoma). Pathology from the descending colon mass revealed fragments of adenocarcinoma. There was not enough tissue for KRAS testing.  CEA was 11,793 on 09/05/2014.  KRAS and NRAS mutational analysis on 09/02/2015 was incomplete.  KRAS results from analysis of codons 59, 61, and 117 were negative.  Results were not obtained for codons 12, 13, and 146.  NRAS gene results from analysis of codons 12 and 13 were negative.  Results were not obtained for codons 59, 69, 61, 117, and 146.  Chest CT angiogram on 08/28/2014 revealed no evidence of pulmonary embolism, but multiple large enhancing liver masses (largest 6.4 cm).  PET scan on 10/04/2014 revealed 6.5 x 6.1 cm hypermetabolic mass in the proximal sigmoid colon,  There were  numerous large hypermetabolic hepatic metastases. There were no additional sites of metastatic disease in the neck, chest or skeleton.  In addition, there was a 3.2 x 2.8 cm low-attenuation right thyroid nodule.   She is status post 14 cycles of FOLFOX chemotherapy (10/09/2014 - 04/11/2015).  She declined Avastin.  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    CEA was 10,169 on 10/09/2014 , 12,034 on 0712/2016, 7765 on 11/20/2014, 5154 on 12/04/2014, 1921 on 01/01/2015, 1266 on 01/15/2015, 770.6 on 01/29/2015, 502.7 on 02/12/2015, 368.7 on 02/26/2015, 269 on 03/14/2015, 178.3 on 03/28/2015, 135.9 on 04/11/2015, 116.1 on 05/13/2015, 90.8 on 06/10/2015, 145.9 on 07/08/2015, 300.7 on 08/05/2015, 296.3 on 08/19/2015, and 443.0 on 09/24/2015, 1341 on 10/08/2015, 523 on 11/12/2015, and 532.5 on 12/09/2015.  Abdominal and pelvic CT scan on 12/28/2014 revealed response to therapy.  Liver metastases and the descending/sigmoid mass were smaller.  There was no new disease.  Abdomen and pelvic CT scan on 03/25/2015 revealed interval  decrease in size of hepatic metastasis and distal descending colon mass.   Abdominal and pelvic CT scan on 03/25/2015 revealed decreasing hepatic metastasis and distal descending colon mass.  Right hepatic lobe lesion was 2.2 x 2.1 cm.  Left lateral hepatic lobe lesion was 1.9 x 1.5 cm.  There was stable thickening of the adrenal glands.  She received 8 cycles of 5-fluorouracil and leucovorin (04/28/2014 - 09/24/2015).  Cycle #4 was held on 06/10/2015 secondary to rectal bleeding.  Abdominal and pelvic MRI on 08/01/2015 revealed multiple hypoenhancing hepatic metastasis in both lobes of the liver (3.3 x 2.2 cm in segment VII, 5.9 x 4.1 cm in segment VIII, and a 2.4 x 1.7 cm caudate lesion).  There was a 4.3 cm length of focally  narrowed sigmoid colon.  There was nodular thickening of the right adrenal gland.  PET scan on 08/21/2015 revealed interval improvement in the multifocal hepatic metastatic disease. There was residual hypermetabolic tumor.  The primary colonic lesion at the junction of the descending and sigmoid colon had decreased in size and metabolic activity.  There was persistent hypermetabolic activity in the lower right neck, possibly associated with the thyroid gland.   Abdominal and pelvic CT scan on 10/04/2015 revealed increased bulkiness of soft tissue mass in distal descending colon compared with previous studies, consistent with primary colon carcinoma.  The mass measured 4.7 cm compared to 3.3 cm.  The liver metastasis were felt larger by second radiology review.  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  Afirma gene expression classifier GEC) was suspicious.  Gene expression signature for medullary thyroid cancer (MTC) and BRAF V600E were negative.  Patient wishes to defer any treatment.  She is s/p 3 cycles of FOLFIRI chemotherapy (10/08/2015 - 10/29/2015; 12/23/2015).  Cycle #1 complicated by  an admission for fever and neutropenia on day 11 (10/18/2015).  All cultures were negative.  Cycle #2 was complicated by fever and neutropenia (ANC 0) despite Neulasta.  She was diagnosed with Klebsiella pneumonia bacteremia.  She was admitted to [facility] from 12/31/2015 - 01/05/2016 with fever and neutropenia.  She was treated with Cefepime.  All cultures remained negative.  While hospitalized she developed atrial fibrillation.  Her amiodarone was increased.  Nadir ANC was 100 on 12/31/2015.  She has history of atrial fibrillation with RVR.  She is on amiodarone.   Symptomatically, she is near baseline.  Exam is unremarkable.  Plan: 1.  Labs today:  CBC with diff, CMP, Mg, CEA.  2.  Cycle #4 FOLFIRI with dose level -3 reduction.  Discussed with pharmacy. 3.  RTC in 2 days for disconnect and Neulasta. 4.  RTC in 1 week for CBC with diff 5.  RTC in 2 weeks for MD assessment, labs (CBC with diff, CMP, Mg, CEA) and FOLFIRI.   Lequita Asal, MD  01/05/2016, 7:23 PM

## 2016-01-06 ENCOUNTER — Inpatient Hospital Stay: Payer: Medicare Other

## 2016-01-06 ENCOUNTER — Inpatient Hospital Stay: Payer: Medicare Other | Admitting: Hematology and Oncology

## 2016-01-12 NOTE — Progress Notes (Deleted)
[facility] day:  01/12/2016   Chief Complaint: Sandy [name] is a 70 y.o. female with stage IV colon cancer who is seen for assessment following interval hospitalization.  HPI: The patient was last seen in the medical oncology clinic on 12/23/2015.  At that time, she was doing well.  She received cycle #3 FOLFIRI.  She returned on 12/25/2015 for Neulasta.  She was admitted to [facility] from 12/31/2015 - 01/05/2016 with fever and neutropenia.  She presented with altered mental status.  Lactic acid was elevated.  She was treated with Cefepime.  All cultures remained negative.  Stool was negative for C difficile.  While hospitalized she developed atrial fibrillation.  Her amiodarone was increased.  Nadir ANC was 100 on 12/31/2015. Counts at discharge included a hematocrit of 23.5, hemoglobin 8.2, platelets 107,000, WBC 7500 with an ANC of 6200.   Past Medical History:  Diagnosis Date  . Cancer ([place])    liver mets; colon cancer  . Constipation   . Diabetes mellitus without complication ([place])   . HOH (hard of hearing)   . Hypertension   . Insomnia   . 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 Sickle cell anemia (H[facility])   . Wears dentures    partial upper and lower    Past Surgical History:  Procedure Laterality Date  . CARDIAC CATHETERIZATION    . COLONOSCOPY WITH PROPOFOL N/A 09/07/2014   Procedure: COLONOSCOPY WITH PROPOFOL;  Surgeon: Lucilla Lame, MD;  Location: [facility] ENDOSCOPY;  Service: Endoscopy;  Laterality: N/A;  . ESOPHAGOGASTRODUODENOSCOPY N/A 09/07/2014   Procedure: ESOPHAGOGASTRODUODENOSCOPY (EGD);  Surgeon: Lucilla Lame, MD;  Location: [facility] ENDOSCOPY;  Service: Endoscopy;  Laterality: N/A;  . FLEXIBLE SIGMOIDOSCOPY N/A 09/02/2015   Procedure: FLEXIBLE SIGMOIDOSCOPY;  Surgeon: Lucilla Lame, MD;  Location: [place];  Service: Endoscopy;  Laterality: N/A;  Diabetic - oral meds Pt has port-a-cath  . LIVER BIOPSY     . PORTACATH PLACEMENT Left 09/24/2014   Procedure: INSERTION PORT-A-CATH;  Surgeon: Robert Bellow, MD;  Location: [facility] ORS;  Service: General;  Laterality: Left;    Family History  Problem Relation Age of Onset  . Cancer Sister     Social History:  reports that she quit smoking about 2 years ago. Her smoking use included Cigarettes. She has a 5.00 pack-year smoking history. She has never used smokeless tobacco. She reports that she does not drink alcohol or use drugs.  The patient has recently been a resident of [facility].  The patient is accompanied by her daughter, [name].  The patient's medical power of attorney is her daughter.   Allergies:  Allergies  Allergen Reactions  . Tylenol [Acetaminophen] Nausea And Vomiting    Current Medications: Current Outpatient Prescriptions  Medication Sig Dispense Refill  . amiodarone (PACERONE) 100 MG tablet Take 100 mg by mouth daily.    Marland Kitchen amiodarone (PACERONE) 400 MG tablet Take 0.5 tablets (200 mg total) by mouth 2 (two) times daily. 30 tablet 0  . aspirin EC 81 MG tablet Take 81 mg by mouth daily. Reported on 08/26/2015    . atorvastatin (LIPITOR) 40 MG tablet Take 40 mg by mouth daily.    . diphenoxylate-atropine (LOMOTIL) 2.5-0.025 MG tablet 1-2 tablets every 6 hours as needed for diarrhea 40 tablet 1  . feeding supplement, ENSURE ENLIVE, (ENSURE ENLIVE) LIQD Take 237 mLs by mouth 2 (two) times daily between meals. (Patient not taking:  Reported on 12/31/2015) 237 mL 12  . gabapentin (NEURONTIN) 100 MG capsule Take 2 capsules (200 mg total) by mouth at bedtime. 1 tab daily and 2 tabs at bedtime. (Patient taking differently: Take 200 mg by mouth at bedtime. 1 tab in the morning daily and 2 capsules at bedtime.) 60 capsule 0  . levofloxacin (LEVAQUIN) 500 MG tablet Take 1 tablet (500 mg total) by mouth daily. 4 tablet 0  . lidocaine-prilocaine (EMLA) cream Apply 1 application topically as needed. Apply to port 1 hour prior to chemotherapy  appointment. Cover with plastic wrap. 30 g 1  . lisinopril (PRINIVIL,ZESTRIL) 10 MG tablet Take 10 mg by mouth daily.    . magnesium 30 MG tablet Take 30 mg by mouth daily.     . megestrol (MEGACE) 400 MG/10ML suspension Take 5 mLs (200 mg total) by mouth daily. to stimulate appetite 240 mL 0  . metFORMIN (GLUCOPHAGE) 1000 MG tablet Take 1,000 mg by mouth 2 (two) times daily.     . metoprolol tartrate (LOPRESSOR) 25 MG tablet Take 1 tablet (25 mg total) by mouth 2 (two) times daily. 60 tablet 0  . Multiple Vitamin (MULTIVITAMIN WITH MINERALS) TABS tablet Take 1 tablet by mouth daily.    . ondansetron (ZOFRAN) 4 MG tablet Take 1 tablet (4 mg total) by mouth every 8 (eight) hours as needed for nausea or vomiting. 90 tablet 2  . oxyCODONE (OXY IR/ROXICODONE) 5 MG immediate release tablet Take 1 tablet (5 mg total) by mouth every 6 (six) hours as needed for severe pain. 30 tablet 0  . traZODone (DESYREL) 50 MG tablet Take 0.5 tablets (25 mg total) by mouth at bedtime as needed for sleep. 20 tablet 0   No current facility-administered medications for this visit.    Facility-Administered Medications Ordered in Other Visits  Medication Dose Route Frequency Provider Last Rate Last Dose  . acetaminophen (TYLENOL) tablet 650 mg  650 mg Oral Once Lequita Asal, MD      . sodium chloride 0.9 % injection 10 mL  10 mL Intracatheter PRN Lequita Asal, MD   10 mL at 10/11/14 1431  . sodium chloride 0.9 % injection 10 mL  10 mL Intracatheter PRN Lequita Asal, MD   10 mL at 02/28/15 1529    Review of Systems:  GENERAL: Feels good.  No fevers.  Weight down 5 pounds. PERFORMANCE STATUS (ECOG): 2. HEENT: Wears a hearing aide. No visual changes, runny nose, sore throat, mouth sores or tenderness. Lungs:  No shortness of breath or cough. No hemoptysis. Cardiac: Heart rate slow.  No chest pain, palpitations, orthopnea, or PND. GI: Eating good.  Loose stools on Boost.  No nausea, vomiting,  diarrhea, melena or hematochezia. GU: No dysuria or hematuria. Musculoskeletal: Denies back pain. No joint pain. No muscle tenderness. Extremities: No pain or swelling. Skin: No rashes, skin lesions, or ulcers Neuro: No headache, numbness or weakness, or balance issues. Endocrine: Diabetes. No thyroid issues, hot flashes or night sweats. Psych: No mood changes or anxiety. Pain: Takes oxycodone in the AM. Review of systems: All other systems   Physical Exam: There were no vitals taken for this visit. GENERAL: Elderly woman sitting comfortably in a wheelchair in no acute distress. MENTAL STATUS: Alert and oriented to person, place and time. HEAD: Wearing a black wrap.  Alopecia totalis. Normocephalic, atraumatic, face symmetric, no Cushingoid features. EYES: Black rimmed glasses. Hazel/green eyes. Pupils equal round and reactive to light and accomodation. No conjunctivitis  or scleral icterus. ENT: Oropharynx clear without lesion. Missing several teeth. Tongue normal. Mucous membranes moist.  RESPIRATORY: Clear to auscultation without rales, wheezes or rhonchi. CARDIOVASCULAR: Regular rate and rhythm without murmur, rub or gallop. ABDOMEN: Soft, non-tender with active bowel sounds. No guarding or rebound tenderness. No hepatosplenomegaly. SKIN: No rashes, ulcer or skin lesions.  EXTREMITIES: No edema, no skin discoloration or tenderness. No palpable cords. LYMPH NODES: Right low anterior cervical 3 cm soft mass (thyroid). No palpable supraclavicular, axillary or inguinal adenopathy  NEUROLOGICAL: Appropriate. PSYCH: Affect normal.  No visits with results within 3 Day(s) from this visit.  Latest known visit with results is:  Admission on 12/31/2015, Discharged on 01/05/2016  No results displayed because visit has over 200 results.      Assessment:  Sandy [name] is a 70 y.o. female with metastatic colon cancer. She presented with a 53 pound weight  loss in 6 months. Colonoscopy on 09/07/2014 revealed a circumferential partially obstructive fungating mass in the descending colon. There was no active bleeding. There was a 1.5 cm pedunculated polyp in the sigmoid colon (tubulovillous adenoma). Pathology from the descending colon mass revealed fragments of adenocarcinoma. There was not enough tissue for KRAS testing.  CEA was 11,793 on 09/05/2014.  KRAS and NRAS mutational analysis on 09/02/2015 was incomplete.  KRAS results from analysis of codons 59, 61, and 117 were negative.  Results were not obtained for codons 12, 13, and 146.  NRAS gene results from analysis of codons 12 and 13 were negative.  Results were not obtained for codons 59, 69, 61, 117, and 146.  Chest CT angiogram on 08/28/2014 revealed no evidence of pulmonary embolism, but multiple large enhancing liver masses (largest 6.4 cm).  PET scan on 10/04/2014 revealed 6.5 x 6.1 cm hypermetabolic mass in the proximal sigmoid colon,  There were  numerous large hypermetabolic hepatic metastases. There were no additional sites of metastatic disease in the neck, chest or skeleton.  In addition, there was a 3.2 x 2.8 cm low-attenuation right thyroid nodule.   She is status post 14 cycles of FOLFOX chemotherapy (10/09/2014 - 04/11/2015).  She declined Avastin.  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    CEA was 10,169 on 10/09/2014 , 12,034 on 0712/2016, 7765 on 11/20/2014, 5154 on 12/04/2014, 1921 on 01/01/2015, 1266 on 01/15/2015, 770.6 on 01/29/2015, 502.7 on 02/12/2015, 368.7 on 02/26/2015, 269 on 03/14/2015, 178.3 on 03/28/2015, 135.9 on 04/11/2015, 116.1 on 05/13/2015, 90.8 on 06/10/2015, 145.9 on 07/08/2015, 300.7 on 08/05/2015, 296.3 on 08/19/2015, and 443.0 on 09/24/2015, 1341 on 10/08/2015, 523 on 11/12/2015, 532.5 on 12/09/2015, and 641.3 on 12/23/2015.  Abdominal and pelvic CT scan on 12/28/2014 revealed response to therapy.  Liver metastases and the  descending/sigmoid mass were smaller.  There was no new disease.  Abdomen and pelvic CT scan on 03/25/2015 revealed interval decrease in size of hepatic metastasis and distal descending colon mass.   Abdominal and pelvic CT scan on 03/25/2015 revealed decreasing hepatic metastasis and distal descending colon mass.  Right hepatic lobe lesion was 2.2 x 2.1 cm.  Left lateral hepatic lobe lesion was 1.9 x 1.5 cm.  There was stable thickening of the adrenal glands.  She received 8 cycles of 5-fluorouracil and leucovorin (04/28/2014 - 09/24/2015).  Cycle #4 was held on 06/10/2015 secondary to rectal bleeding.  Abdominal and pelvic MRI on 08/01/2015 revealed multiple hypoenhancing hepatic metastasis in both lobes of the liver (3.3 x 2.2 cm in segment VII, 5.9 x  4.1 cm in segment VIII, and a 2.4 x 1.7 cm caudate lesion).  There was a 4.3 cm length of focally narrowed sigmoid colon.  There was nodular thickening of the right adrenal gland.  PET scan on 08/21/2015 revealed interval improvement in the multifocal hepatic metastatic disease. There was residual hypermetabolic tumor.  The primary colonic lesion at the junction of the descending and sigmoid colon had decreased in size and metabolic activity.  There was persistent hypermetabolic activity in the lower right neck, possibly associated with the thyroid gland.   Abdominal and pelvic CT scan on 10/04/2015 revealed increased bulkiness of soft tissue mass in distal descending colon compared with previous studies, consistent with primary colon carcinoma.  The mass measured 4.7 cm compared to 3.3 cm.  The liver metastasis were felt larger by second radiology review.  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  Afirma gene expression classifier GEC) was suspicious.  Gene expression signature for medullary thyroid cancer (MTC) and BRAF V600E were negative.  Patient wishes to defer  any treatment.  She is s/p 3 cycles of FOLFIRI chemotherapy (10/08/2015 - 10/29/2015; 12/23/2015).  Cycle #1 complicated by an admission for fever and neutropenia on day 11 (10/18/2015).  All cultures were negative.  Cycle #2 was complicated by fever and neutropenia (ANC 0) despite Neulasta.  She was diagnosed with Klebsiella pneumonia bacteremia.  Cycle #3 was complicated by fever and neutropenia (ANC 100) despite Neulasta and dose reduction.  She was admitted to [facility] from 12/31/2015 - 01/05/2016 with fever and neutropenia.  She was treated with Cefepime.  All cultures remained negative.  While hospitalized she developed atrial fibrillation.  Her amiodarone was increased.  Nadir ANC was 100 on 12/31/2015.  She has history of atrial fibrillation with RVR.  She is on amiodarone.   Symptomatically, she is near baseline.  Exam is unremarkable.  Plan: 1.  Labs today:  CBC with diff, CMP, Mg, CEA.  2.  Cycle #4 FOLFIRI with dose level -3 reduction.  Discussed with pharmacy. 3.  RTC in 2 days for disconnect and Neulasta. 4.  RTC in 1 week for CBC with diff 5.  RTC in 2 weeks for MD assessment, labs (CBC with diff, CMP, Mg, CEA) and FOLFIRI.   Lequita Asal, MD  01/12/2016, 9:57 PM

## 2016-01-13 ENCOUNTER — Inpatient Hospital Stay: Payer: Medicare Other

## 2016-01-13 ENCOUNTER — Inpatient Hospital Stay: Payer: Medicare Other | Admitting: Hematology and Oncology

## 2016-01-13 ENCOUNTER — Other Ambulatory Visit: Payer: Self-pay | Admitting: Hematology and Oncology

## 2016-01-14 ENCOUNTER — Telehealth: Payer: Self-pay | Admitting: *Deleted

## 2016-01-14 ENCOUNTER — Other Ambulatory Visit: Payer: Self-pay | Admitting: Hematology and Oncology

## 2016-01-14 DIAGNOSIS — C787 Secondary malignant neoplasm of liver and intrahepatic bile duct: Principal | ICD-10-CM

## 2016-01-14 DIAGNOSIS — C189 Malignant neoplasm of colon, unspecified: Secondary | ICD-10-CM

## 2016-01-14 NOTE — Telephone Encounter (Signed)
Called daughter to talk about the specialty test that dr Mike Gip wants pt to have that they spoke about in hospital to determine if one of the drugs is causing the immune system to stay so low after treatment.even with the help of neulasta and they are agreeable to coming next week 10/9 and I will ask sch. Staff  To call tom with appt.

## 2016-01-15 ENCOUNTER — Other Ambulatory Visit: Payer: Self-pay | Admitting: *Deleted

## 2016-01-20 ENCOUNTER — Inpatient Hospital Stay: Payer: Medicare Other | Attending: Hematology and Oncology

## 2016-01-20 ENCOUNTER — Other Ambulatory Visit: Payer: Self-pay

## 2016-01-20 DIAGNOSIS — E119 Type 2 diabetes mellitus without complications: Secondary | ICD-10-CM | POA: Insufficient documentation

## 2016-01-20 DIAGNOSIS — K5900 Constipation, unspecified: Secondary | ICD-10-CM | POA: Insufficient documentation

## 2016-01-20 DIAGNOSIS — C189 Malignant neoplasm of colon, unspecified: Secondary | ICD-10-CM

## 2016-01-20 DIAGNOSIS — Z809 Family history of malignant neoplasm, unspecified: Secondary | ICD-10-CM | POA: Insufficient documentation

## 2016-01-20 DIAGNOSIS — G629 Polyneuropathy, unspecified: Secondary | ICD-10-CM | POA: Insufficient documentation

## 2016-01-20 DIAGNOSIS — E041 Nontoxic single thyroid nodule: Secondary | ICD-10-CM | POA: Diagnosis not present

## 2016-01-20 DIAGNOSIS — D5780 Other sickle-cell disorders without crisis: Secondary | ICD-10-CM | POA: Insufficient documentation

## 2016-01-20 DIAGNOSIS — I10 Essential (primary) hypertension: Secondary | ICD-10-CM | POA: Diagnosis not present

## 2016-01-20 DIAGNOSIS — I4891 Unspecified atrial fibrillation: Secondary | ICD-10-CM | POA: Insufficient documentation

## 2016-01-20 DIAGNOSIS — Z87891 Personal history of nicotine dependence: Secondary | ICD-10-CM | POA: Diagnosis not present

## 2016-01-20 DIAGNOSIS — D709 Neutropenia, unspecified: Secondary | ICD-10-CM | POA: Diagnosis not present

## 2016-01-20 DIAGNOSIS — Z923 Personal history of irradiation: Secondary | ICD-10-CM | POA: Diagnosis not present

## 2016-01-20 DIAGNOSIS — Z9221 Personal history of antineoplastic chemotherapy: Secondary | ICD-10-CM | POA: Insufficient documentation

## 2016-01-20 DIAGNOSIS — C187 Malignant neoplasm of sigmoid colon: Secondary | ICD-10-CM | POA: Insufficient documentation

## 2016-01-20 DIAGNOSIS — G4700 Insomnia, unspecified: Secondary | ICD-10-CM | POA: Insufficient documentation

## 2016-01-20 DIAGNOSIS — R109 Unspecified abdominal pain: Secondary | ICD-10-CM | POA: Diagnosis not present

## 2016-01-20 DIAGNOSIS — C787 Secondary malignant neoplasm of liver and intrahepatic bile duct: Secondary | ICD-10-CM | POA: Insufficient documentation

## 2016-01-20 DIAGNOSIS — Z7984 Long term (current) use of oral hypoglycemic drugs: Secondary | ICD-10-CM | POA: Diagnosis not present

## 2016-01-20 DIAGNOSIS — Z79899 Other long term (current) drug therapy: Secondary | ICD-10-CM | POA: Insufficient documentation

## 2016-01-20 LAB — CBC WITH DIFFERENTIAL/PLATELET
Basophils Absolute: 0.1 10*3/uL (ref 0–0.1)
Basophils Relative: 1 %
Eosinophils Absolute: 0 10*3/uL (ref 0–0.7)
Eosinophils Relative: 0 %
HCT: 30.8 % — ABNORMAL LOW (ref 35.0–47.0)
Hemoglobin: 10.1 g/dL — ABNORMAL LOW (ref 12.0–16.0)
Lymphocytes Relative: 7 %
Lymphs Abs: 1.5 10*3/uL (ref 1.0–3.6)
MCH: 30.4 pg (ref 26.0–34.0)
MCHC: 32.7 g/dL (ref 32.0–36.0)
MCV: 92.9 fL (ref 80.0–100.0)
Monocytes Absolute: 0.9 10*3/uL (ref 0.2–0.9)
Monocytes Relative: 4 %
Neutro Abs: 18.4 10*3/uL — ABNORMAL HIGH (ref 1.4–6.5)
Neutrophils Relative %: 88 %
Platelets: 534 10*3/uL — ABNORMAL HIGH (ref 150–440)
RBC: 3.32 MIL/uL — ABNORMAL LOW (ref 3.80–5.20)
RDW: 18.4 % — ABNORMAL HIGH (ref 11.5–14.5)
WBC: 21 10*3/uL — ABNORMAL HIGH (ref 3.6–11.0)

## 2016-01-20 LAB — COMPREHENSIVE METABOLIC PANEL
ALT: 11 U/L — ABNORMAL LOW (ref 14–54)
AST: 23 U/L (ref 15–41)
Albumin: 2.7 g/dL — ABNORMAL LOW (ref 3.5–5.0)
Alkaline Phosphatase: 136 U/L — ABNORMAL HIGH (ref 38–126)
Anion gap: 8 (ref 5–15)
BUN: 12 mg/dL (ref 6–20)
CO2: 22 mmol/L (ref 22–32)
Calcium: 8.9 mg/dL (ref 8.9–10.3)
Chloride: 112 mmol/L — ABNORMAL HIGH (ref 101–111)
Creatinine, Ser: 1.17 mg/dL — ABNORMAL HIGH (ref 0.44–1.00)
GFR calc Af Amer: 53 mL/min — ABNORMAL LOW (ref >60)
GFR calc non Af Amer: 45 mL/min — ABNORMAL LOW (ref >60)
Glucose, Bld: 116 mg/dL — ABNORMAL HIGH (ref 65–99)
Potassium: 4.2 mmol/L (ref 3.5–5.1)
Sodium: 142 mmol/L (ref 135–145)
Total Bilirubin: 0.6 mg/dL (ref 0.3–1.2)
Total Protein: 5.9 g/dL — ABNORMAL LOW (ref 6.5–8.1)

## 2016-01-24 LAB — MISC LABCORP TEST (SEND OUT)

## 2016-01-26 ENCOUNTER — Other Ambulatory Visit: Payer: Self-pay | Admitting: Hematology and Oncology

## 2016-01-26 NOTE — Progress Notes (Signed)
[facility] day:  01/27/2016   Chief Complaint: Sandy [name] is a 70 y.o. female with stage IV colon cancer who is seen for assessment following interval hospitalization.  HPI: The patient was last seen in the medical oncology clinic on 12/23/2015.  At that time, she was doing well.  She received cycle #3 FOLFIRI.  She returned on 12/25/2015 for Neulasta.  She was admitted to [facility] from 12/31/2015 - 01/05/2016 with fever and neutropenia.  She presented with altered mental status.  Lactic acid was elevated.  She was treated with Cefepime.  All cultures remained negative.  Stool was negative for C difficile.  While hospitalized she developed atrial fibrillation.  Her amiodarone was increased.  Nadir ANC was 100 on 12/31/2015. Counts at discharge included a hematocrit of 23.5, hemoglobin 8.2, platelets 107,000, WBC 7500 with an ANC of 6200.  Because of recurrent neutropenia despite dose reduction of irinotecan, she underwent  UGT1A1 testing.  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  One copy of the UGT1A1*37 was also present.  It is associated with reduced UGT1A1 expression, but no direct association with irinotecan toxicity.  Symptomatically, he notes a stomachache. She had some [place] veins and candy yesterday.  She ate some [place] vanilla ice cream and apparently is lactose intolerant. Her stomach aches today. She notes that her eyes hurt.  She is finding it difficult to come to clinic as it always results in her getting admitted to the hospital after chemotherapy.   She is off her metoprolol. 24 hour Holter is pending. She has an echocardiogram on 02/10/2016.   Past Medical History:  Diagnosis Date  . Cancer ([place])    liver mets; colon cancer  . Constipation   . Diabetes mellitus without complication ([place])   . HOH (hard of hearing)   . Hypertension   . Insomnia    . 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 Sickle cell anemia ([facility])   . Wears dentures    partial upper and lower    Past Surgical History:  Procedure Laterality Date  . CARDIAC CATHETERIZATION    . COLONOSCOPY WITH PROPOFOL N/A 09/07/2014   Procedure: COLONOSCOPY WITH PROPOFOL;  Surgeon: Lucilla Lame, MD;  Location: [facility] ENDOSCOPY;  Service: Endoscopy;  Laterality: N/A;  . ESOPHAGOGASTRODUODENOSCOPY N/A 09/07/2014   Procedure: ESOPHAGOGASTRODUODENOSCOPY (EGD);  Surgeon: Lucilla Lame, MD;  Location: [facility] ENDOSCOPY;  Service: Endoscopy;  Laterality: N/A;  . FLEXIBLE SIGMOIDOSCOPY N/A 09/02/2015   Procedure: FLEXIBLE SIGMOIDOSCOPY;  Surgeon: Lucilla Lame, MD;  Location: [facility];  Service: Endoscopy;  Laterality: N/A;  Diabetic - oral meds Pt has port-a-cath  . LIVER BIOPSY    . PORTACATH PLACEMENT Left 09/24/2014   Procedure: INSERTION PORT-A-CATH;  Surgeon: Robert Bellow, MD;  Location: [facility] ORS;  Service: General;  Laterality: Left;    Family History  Problem Relation Age of Onset  . Cancer Sister     Social History:  reports that she quit smoking about 2 years ago. Her smoking use included Cigarettes. She has a 5.00 pack-year smoking history. She has never used smokeless tobacco. She reports that she does not drink alcohol or use drugs.   The patient's medical power of attorney is her daughter.  The patient is accompanied by her daughter, Sandy [name].    Allergies:  Allergies  Allergen Reactions  . Tylenol [Acetaminophen] Nausea And Vomiting  Current Medications: Current Outpatient Prescriptions  Medication Sig Dispense Refill  . amiodarone (PACERONE) 100 MG tablet Take 100 mg by mouth daily.    Marland Kitchen amiodarone (PACERONE) 400 MG tablet Take 0.5 tablets (200 mg total) by mouth 2 (two) times daily. 30 tablet 0  . aspirin EC 81 MG tablet Take 81 mg by mouth daily. Reported on 08/26/2015    . atorvastatin (LIPITOR) 40 MG tablet Take 40 mg by mouth daily.     . diphenoxylate-atropine (LOMOTIL) 2.5-0.025 MG tablet 1-2 tablets every 6 hours as needed for diarrhea 40 tablet 1  . feeding supplement, ENSURE ENLIVE, (ENSURE ENLIVE) LIQD Take 237 mLs by mouth 2 (two) times daily between meals. 237 mL 12  . gabapentin (NEURONTIN) 100 MG capsule Take 2 capsules (200 mg total) by mouth at bedtime. 1 tab daily and 2 tabs at bedtime. (Patient taking differently: Take 200 mg by mouth at bedtime. 1 tab in the morning daily and 2 capsules at bedtime.) 60 capsule 0  . levofloxacin (LEVAQUIN) 500 MG tablet Take 1 tablet (500 mg total) by mouth daily. 4 tablet 0  . lidocaine-prilocaine (EMLA) cream Apply 1 application topically as needed. Apply to port 1 hour prior to chemotherapy appointment. Cover with plastic wrap. 30 g 1  . lisinopril (PRINIVIL,ZESTRIL) 10 MG tablet Take 10 mg by mouth daily.    . magnesium 30 MG tablet Take 30 mg by mouth daily.     . megestrol (MEGACE) 400 MG/10ML suspension Take 5 mLs (200 mg total) by mouth daily. to stimulate appetite 240 mL 0  . metFORMIN (GLUCOPHAGE) 1000 MG tablet Take 1,000 mg by mouth 2 (two) times daily.     . Multiple Vitamin (MULTIVITAMIN WITH MINERALS) TABS tablet Take 1 tablet by mouth daily.    . ondansetron (ZOFRAN) 4 MG tablet Take 1 tablet (4 mg total) by mouth every 8 (eight) hours as needed for nausea or vomiting. 90 tablet 2  . oxyCODONE (OXY IR/ROXICODONE) 5 MG immediate release tablet Take 1 tablet (5 mg total) by mouth every 6 (six) hours as needed for severe pain. 30 tablet 0  . traZODone (DESYREL) 50 MG tablet Take 0.5 tablets (25 mg total) by mouth at bedtime as needed for sleep. 20 tablet 0  . metoprolol tartrate (LOPRESSOR) 25 MG tablet Take 1 tablet (25 mg total) by mouth 2 (two) times daily. (Patient not taking: Reported on 01/27/2016) 60 tablet 0   No current facility-administered medications for this visit.    Facility-Administered Medications Ordered in Other Visits  Medication Dose Route  Frequency Provider Last Rate Last Dose  . acetaminophen (TYLENOL) tablet 650 mg  650 mg Oral Once Lequita Asal, MD      . sodium chloride 0.9 % injection 10 mL  10 mL Intracatheter PRN Lequita Asal, MD   10 mL at 10/11/14 1431  . sodium chloride 0.9 % injection 10 mL  10 mL Intracatheter PRN Lequita Asal, MD   10 mL at 02/28/15 1529    Review of Systems:  GENERAL: Feels bad secondary to a stomach ache.  No fevers.  Weight stable. PERFORMANCE STATUS (ECOG): 2. HEENT: Wears a hearing aide. No visual changes, runny nose, sore throat, mouth sores or tenderness. Lungs:  No shortness of breath or cough. No hemoptysis. Cardiac: No chest pain, palpitations, orthopnea, or PND. Off metoprolol.  24 hour Holter pending.  Echo on 02/10/2016. GI: Stomach cramps secondary to lactose intolerance and recent ice cream.  No nausea,  vomiting, diarrhea, melena or hematochezia. GU: No dysuria or hematuria. Musculoskeletal: No back pain. No joint pain. No muscle tenderness. Extremities: No pain or swelling. Skin: No rashes, skin lesions, or ulcers Neuro: No headache, numbness or weakness, or balance issues. Endocrine: Diabetes. No thyroid issues, hot flashes or night sweats. Psych: No mood changes or anxiety. Pain: Takes oxycodone in the AM. Review of systems: All other systems   Physical Exam: Blood pressure 128/85, pulse 88, temperature (!) 96 F (35.6 C), temperature source Tympanic, resp. rate 18, weight 142 lb 13.7 oz (64.8 kg). GENERAL: Elderly woman sitting comfortably in a wheelchair in no acute distress. MENTAL STATUS: Alert and oriented to person, place and time. HEAD: Wearing a black wrap.  Alopecia totalis. Normocephalic, atraumatic, face symmetric, no Cushingoid features. EYES: Black rimmed glasses. Hazel/green eyes. Pupils equal round and reactive to light and accomodation. No conjunctivitis or scleral icterus. ENT: Oropharynx clear without lesion.  Missing several teeth. Tongue normal. Mucous membranes moist.  RESPIRATORY: Clear to auscultation without rales, wheezes or rhonchi. CARDIOVASCULAR: Regular rate and rhythm without murmur, rub or gallop. ABDOMEN: Soft, non-tender with active bowel sounds. No guarding or rebound tenderness. No hepatosplenomegaly. SKIN: No rashes, ulcer or skin lesions.  EXTREMITIES: No edema, no skin discoloration or tenderness. No palpable cords. LYMPH NODES: Sable right low anterior cervical 3 cm soft mass. No palpable supraclavicular, axillary or inguinal adenopathy  NEUROLOGICAL: Appropriate. PSYCH: Affect normal.   Appointment on 01/27/2016  Component Date Value Ref Range Status  . Magnesium 01/27/2016 1.8  1.7 - 2.4 mg/dL Final    Assessment:  Sandy [name] is a 70 y.o. female with metastatic colon cancer. She presented with a 53 pound weight loss in 6 months. Colonoscopy on 09/07/2014 revealed a circumferential partially obstructive fungating mass in the descending colon. There was no active bleeding. There was a 1.5 cm pedunculated polyp in the sigmoid colon (tubulovillous adenoma). Pathology from the descending colon mass revealed fragments of adenocarcinoma. There was not enough tissue for KRAS testing.  CEA was 11,793 on 09/05/2014.  KRAS and NRAS mutational analysis on 09/02/2015 was incomplete.  KRAS results from analysis of codons 59, 61, and 117 were negative.  Results were not obtained for codons 12, 13, and 146.  NRAS gene results from analysis of codons 12 and 13 were negative.  Results were not obtained for codons 59, 69, 61, 117, and 146.  Chest CT angiogram on 08/28/2014 revealed no evidence of pulmonary embolism, but multiple large enhancing liver masses (largest 6.4 cm).  PET scan on 10/04/2014 revealed 6.5 x 6.1 cm hypermetabolic mass in the proximal sigmoid colon,  There were  numerous large hypermetabolic hepatic metastases. There were no additional sites of  metastatic disease in the neck, chest or skeleton.  In addition, there was a 3.2 x 2.8 cm low-attenuation right thyroid nodule.   She is status post 14 cycles of FOLFOX chemotherapy (10/09/2014 - 04/11/2015).  She declined Avastin.  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    CEA was 10,169 on 10/09/2014 , 12,034 on 0712/2016, 7765 on 11/20/2014, 5154 on 12/04/2014, 1921 on 01/01/2015, 1266 on 01/15/2015, 770.6 on 01/29/2015, 502.7 on 02/12/2015, 368.7 on 02/26/2015, 269 on 03/14/2015, 178.3 on 03/28/2015, 135.9 on 04/11/2015, 116.1 on 05/13/2015, 90.8 on 06/10/2015, 145.9 on 07/08/2015, 300.7 on 08/05/2015, 296.3 on 08/19/2015, and 443.0 on 09/24/2015, 1341 on 10/08/2015, 523 on 11/12/2015, 532.5 on 12/09/2015, 641.3 on 12/23/2015, and 1268.0 on 01/27/2016.  Abdominal and pelvic CT scan  on 12/28/2014 revealed response to therapy.  Liver metastases and the descending/sigmoid mass were smaller.  There was no new disease.  Abdomen and pelvic CT scan on 03/25/2015 revealed interval decrease in size of hepatic metastasis and distal descending colon mass.   Abdominal and pelvic CT scan on 03/25/2015 revealed decreasing hepatic metastasis and distal descending colon mass.  Right hepatic lobe lesion was 2.2 x 2.1 cm.  Left lateral hepatic lobe lesion was 1.9 x 1.5 cm.  There was stable thickening of the adrenal glands.  She received 8 cycles of 5-fluorouracil and leucovorin (04/28/2014 - 09/24/2015).  Cycle #4 was held on 06/10/2015 secondary to rectal bleeding.  Abdominal and pelvic MRI on 08/01/2015 revealed multiple hypoenhancing hepatic metastasis in both lobes of the liver (3.3 x 2.2 cm in segment VII, 5.9 x 4.1 cm in segment VIII, and a 2.4 x 1.7 cm caudate lesion).  There was a 4.3 cm length of focally narrowed sigmoid colon.  There was nodular thickening of the right adrenal gland.  PET scan on 08/21/2015 revealed interval improvement in the multifocal hepatic metastatic disease.  There was residual hypermetabolic tumor.  The primary colonic lesion at the junction of the descending and sigmoid colon had decreased in size and metabolic activity.  There was persistent hypermetabolic activity in the lower right neck, possibly associated with the thyroid gland.   Abdominal and pelvic CT scan on 10/04/2015 revealed increased bulkiness of soft tissue mass in distal descending colon compared with previous studies, consistent with primary colon carcinoma.  The mass measured 4.7 cm compared to 3.3 cm.  The liver metastasis were felt larger by second radiology review.  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  Afirma gene expression classifier GEC) was suspicious.  Gene expression signature for medullary thyroid cancer (MTC) and BRAF V600E were negative.  Patient wishes to defer any treatment.  She is s/p 3 cycles of FOLFIRI chemotherapy (10/08/2015 - 10/29/2015; 12/23/2015).  Cycle #1 complicated by an admission for fever and neutropenia on day 11 (10/18/2015).  All cultures were negative.  Cycle #2 was complicated by fever and neutropenia (ANC 0) despite Neulasta.  She was diagnosed with Klebsiella pneumonia bacteremia.  Cycle #3 was complicated by fever and neutropenia (ANC 100) despite Neulasta and dose reduction.  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  One copy of the UGT1A1*37 was also present.  It is associated with reduced UGT1A1 expression, but no direct association with irinotecan toxicity.  She was admitted to [facility] from 12/31/2015 - 01/05/2016 with fever and neutropenia.  She was treated with Cefepime.  All cultures remained negative.  While hospitalized she developed atrial fibrillation.  Her amiodarone was increased.  Nadir ANC was 100 on 12/31/2015.  She has history of atrial fibrillation with RVR.   She is on amiodarone.  She is off metoprolol.  She had a 24 hour Holter.  Echo is scheduled for 02/10/2016.  Symptomatically, she has a stomach ache after eating ice cream (lactose intolerant).  She is concerned about getting admitted to the hospital.  Exam is unremarkable.  Plan: 1.  Labs today:  CBC with diff, CMP, Mg, CEA. 2.  Discuss UGT1A1 testing and decreased clearance of irinotecan resulting in increased toxicity (neutropenia).  Discuss further dose reduction (50% per pharmacy). 3.  Discuss patient's thoughts about chemotherapy.  She wishes to defer secondary to her stomach ache  and awaiting cardiac clearance.  We discussed other treatment options beside an irinotecan based regimen (Stivarga and Lonsurf). 4.  RTC on 02/11/2016 for MD assessment, labs (CBC with diff, CMP, Mg, CEA), and cycle #5 FOLFIRI (dose reduced).   Lequita Asal, MD  01/27/2016, 10:09 AM

## 2016-01-27 ENCOUNTER — Encounter: Payer: Self-pay | Admitting: Hematology and Oncology

## 2016-01-27 ENCOUNTER — Other Ambulatory Visit: Payer: Self-pay | Admitting: *Deleted

## 2016-01-27 ENCOUNTER — Inpatient Hospital Stay: Payer: Medicare Other

## 2016-01-27 ENCOUNTER — Inpatient Hospital Stay (HOSPITAL_BASED_OUTPATIENT_CLINIC_OR_DEPARTMENT_OTHER): Payer: Medicare Other | Admitting: Hematology and Oncology

## 2016-01-27 ENCOUNTER — Encounter: Payer: Self-pay | Admitting: *Deleted

## 2016-01-27 VITALS — BP 128/85 | HR 88 | Temp 96.0°F | Resp 18 | Wt 142.9 lb

## 2016-01-27 DIAGNOSIS — C189 Malignant neoplasm of colon, unspecified: Secondary | ICD-10-CM

## 2016-01-27 DIAGNOSIS — C787 Secondary malignant neoplasm of liver and intrahepatic bile duct: Principal | ICD-10-CM

## 2016-01-27 DIAGNOSIS — Z87891 Personal history of nicotine dependence: Secondary | ICD-10-CM

## 2016-01-27 DIAGNOSIS — E119 Type 2 diabetes mellitus without complications: Secondary | ICD-10-CM

## 2016-01-27 DIAGNOSIS — Z79899 Other long term (current) drug therapy: Secondary | ICD-10-CM

## 2016-01-27 DIAGNOSIS — R109 Unspecified abdominal pain: Secondary | ICD-10-CM

## 2016-01-27 DIAGNOSIS — G4700 Insomnia, unspecified: Secondary | ICD-10-CM

## 2016-01-27 DIAGNOSIS — I4891 Unspecified atrial fibrillation: Secondary | ICD-10-CM | POA: Diagnosis not present

## 2016-01-27 DIAGNOSIS — E041 Nontoxic single thyroid nodule: Secondary | ICD-10-CM

## 2016-01-27 DIAGNOSIS — C801 Malignant (primary) neoplasm, unspecified: Secondary | ICD-10-CM

## 2016-01-27 DIAGNOSIS — D709 Neutropenia, unspecified: Secondary | ICD-10-CM

## 2016-01-27 DIAGNOSIS — C187 Malignant neoplasm of sigmoid colon: Secondary | ICD-10-CM

## 2016-01-27 DIAGNOSIS — D5780 Other sickle-cell disorders without crisis: Secondary | ICD-10-CM

## 2016-01-27 DIAGNOSIS — Z7984 Long term (current) use of oral hypoglycemic drugs: Secondary | ICD-10-CM

## 2016-01-27 DIAGNOSIS — I10 Essential (primary) hypertension: Secondary | ICD-10-CM

## 2016-01-27 DIAGNOSIS — K5900 Constipation, unspecified: Secondary | ICD-10-CM

## 2016-01-27 DIAGNOSIS — Z9221 Personal history of antineoplastic chemotherapy: Secondary | ICD-10-CM

## 2016-01-27 DIAGNOSIS — Z1589 Genetic susceptibility to other disease: Secondary | ICD-10-CM

## 2016-01-27 DIAGNOSIS — G629 Polyneuropathy, unspecified: Secondary | ICD-10-CM

## 2016-01-27 DIAGNOSIS — Z809 Family history of malignant neoplasm, unspecified: Secondary | ICD-10-CM

## 2016-01-27 DIAGNOSIS — Z923 Personal history of irradiation: Secondary | ICD-10-CM

## 2016-01-27 LAB — MAGNESIUM: Magnesium: 1.8 mg/dL (ref 1.7–2.4)

## 2016-01-27 MED ORDER — HEPARIN SOD (PORK) LOCK FLUSH 100 UNIT/ML IV SOLN
500.0000 [IU] | Freq: Once | INTRAVENOUS | Status: AC
  Administered 2016-01-27: 500 [IU] via INTRAVENOUS

## 2016-01-27 NOTE — Patient Instructions (Signed)
Regorafenib tablets What is this medicine? REGORAFENIB (RE goe RAF e nib) is a medicine that targets proteins in cancer cells and stops the cancer cells from growing. It is used to treat colorectal cancer and gastrointestinal stromal tumors (GIST). This medicine may be used for other purposes; ask your health care provider or pharmacist if you have questions. What should I tell my health care provider before I take this medicine? They need to know if you have any of these conditions: -bleeding disorders -heart disease -high blood pressure -liver disease -recent surgery -an unusual or allergic reaction to regorafenib, other medicines, foods, dyes, or preservatives -pregnant or trying to get pregnant -breast-feeding How should I use this medicine? Take this medicine by mouth with a glass of water. Follow the directions on the prescription label. Do not take it more often than directed. Take this medicine with food. Do not take with grapefruit juice. Do not stop taking except on your doctor's advice. Talk to your pediatrician regarding the use of this medicine in children. Special care may be needed. Overdosage: If you think you have taken too much of this medicine contact a poison control center or emergency room at once. NOTE: This medicine is only for you. Do not share this medicine with others. What if I miss a dose? If you miss a dose, take it as soon as you can. If it is almost time for your next dose, take only that dose. Do not take double or extra doses. What may interact with this medicine? This medicine may interact with the following: -carbamazepine -irinotecan -itraconazole -ketoconazole -phenobarbital -phenytoin -posaconazole -rifampin -St. John's Wort -telithromycin -voriconazole -warfarin This list may not describe all possible interactions. Give your health care provider a list of all the medicines, herbs, non-prescription drugs, or dietary supplements you use. Also  tell them if you smoke, drink alcohol, or use illegal drugs. Some items may interact with your medicine. What should I watch for while using this medicine? This drug may make you feel generally unwell. This is not uncommon, as chemotherapy can affect healthy cells as well as cancer cells. Report any side effects. Continue your course of treatment even though you feel ill unless your doctor tells you to stop. You may need blood work done while you are taking this medicine. Do not become pregnant while taking this medicine or for 2 months after stopping it. Women should inform their doctor if they wish to become pregnant or think they might be pregnant. Men should not father a child while taking this medicine and for 2 months after stopping it. There is a potential for serious side effects to an unborn child. Talk to your health care professional or pharmacist for more information. Do not breast-feed an infant while taking this medicine. If you are going to have surgery or any other procedures, tell your doctor you are taking this medicine. Talk to your doctor about your risk of cancer. You may be more at risk for certain types of cancers if you take this medicine. What side effects may I notice from receiving this medicine? Side effects that you should report to your doctor or health care professional as soon as possible: -allergic reactions like skin rash, itching or hives, swelling of the face, lips, or tongue -bloody or black, tarry stools -breathing problems -changes in vision -chest pain or chest tightness -confusion -dizziness -feeling faint or lightheaded -high fever -light-colored stools -nausea, vomiting -red or dark-brown urine -red spots on the skin -right upper  belly pain -seizures -severe headache -sores on the hands or feet -spitting up blood or brown material that looks like coffee grounds -stomach pain -unusual bruising or bleeding from the eye, gums, or nose -unusually  weak or tired -yellowing of the eyes or skin Side effects that usually do not require medical attention (Report these to your doctor or health care professional if they continue or are bothersome.): -diarrhea -hoarseness -loss of appetite -sore throat -tiredness -weight loss This list may not describe all possible side effects. Call your doctor for medical advice about side effects. You may report side effects to FDA at 1-800-FDA-1088. Where should I keep my medicine? Keep out of the reach of children. Store between 20 and 25 degrees C (68 and 77 degrees F). Keep this medicine in the original container. Throw away any unused medicine after the expiration date. NOTE: This sheet is a summary. It may not cover all possible information. If you have questions about this medicine, talk to your doctor, pharmacist, or health care provider.    2016, Elsevier/Gold Standard. (2014-05-29 14:34:12)

## 2016-01-27 NOTE — Progress Notes (Signed)
Patient ate a scoop of ice cream last night.  States her stomach has been hurting this morning.  Patient has become lactose intolerant.

## 2016-01-28 ENCOUNTER — Other Ambulatory Visit: Payer: Self-pay | Admitting: *Deleted

## 2016-01-28 LAB — CEA: CEA: 1268 ng/mL — ABNORMAL HIGH (ref 0.0–4.7)

## 2016-01-28 MED ORDER — OXYCODONE HCL 5 MG PO TABS: 5.0000 mg | ORAL_TABLET | Freq: Four times a day (QID) | ORAL | 0 refills | Status: DC | PRN

## 2016-01-28 NOTE — Telephone Encounter (Signed)
Has had loose stools since Sunday, Advised her to use Imodium AD to control it. She had said she was "letting it work itself out". She voiced understanding of not letting her get dehydrated by controlling diarrhea

## 2016-01-31 ENCOUNTER — Telehealth: Payer: Self-pay | Admitting: *Deleted

## 2016-01-31 NOTE — Telephone Encounter (Signed)
Per Dr Sherrine Maples, Hold Neurontin which causes sleepiness, withdrawn feeling and see if sx improve. If not, call back Monday. If sx become worse over weekend, call on call md. Attempted to contact [place], left msg for her to return my call

## 2016-01-31 NOTE — Telephone Encounter (Signed)
I spoke with Sandy [name] and she repeated MD instructions back to me

## 2016-01-31 NOTE — Telephone Encounter (Signed)
Called to report that Sandy [name] is incoherent in the mornings and is asking what can be done about it. Sh eats and drinks, but is "out of it" Her V/s B/P 137/75  T - 98.4  P - 100.  Please advise.

## 2016-02-01 ENCOUNTER — Other Ambulatory Visit: Payer: Self-pay

## 2016-02-01 ENCOUNTER — Emergency Department: Payer: Medicare Other

## 2016-02-01 ENCOUNTER — Encounter: Payer: Self-pay | Admitting: Emergency Medicine

## 2016-02-01 ENCOUNTER — Observation Stay
Admission: EM | Admit: 2016-02-01 | Discharge: 2016-02-03 | Disposition: A | Discharge status: Home / Self Care | Payer: Medicare Other | Attending: Internal Medicine | Admitting: Internal Medicine

## 2016-02-01 DIAGNOSIS — D72829 Elevated white blood cell count, unspecified: Secondary | ICD-10-CM | POA: Diagnosis not present

## 2016-02-01 DIAGNOSIS — I119 Hypertensive heart disease without heart failure: Secondary | ICD-10-CM | POA: Diagnosis not present

## 2016-02-01 DIAGNOSIS — I700 Atherosclerosis of aorta: Secondary | ICD-10-CM | POA: Insufficient documentation

## 2016-02-01 DIAGNOSIS — N281 Cyst of kidney, acquired: Secondary | ICD-10-CM | POA: Diagnosis not present

## 2016-02-01 DIAGNOSIS — R410 Disorientation, unspecified: Secondary | ICD-10-CM | POA: Insufficient documentation

## 2016-02-01 DIAGNOSIS — E11649 Type 2 diabetes mellitus with hypoglycemia without coma: Secondary | ICD-10-CM | POA: Insufficient documentation

## 2016-02-01 DIAGNOSIS — D259 Leiomyoma of uterus, unspecified: Secondary | ICD-10-CM | POA: Diagnosis not present

## 2016-02-01 DIAGNOSIS — Z66 Do not resuscitate: Secondary | ICD-10-CM | POA: Diagnosis not present

## 2016-02-01 DIAGNOSIS — Z7982 Long term (current) use of aspirin: Secondary | ICD-10-CM | POA: Insufficient documentation

## 2016-02-01 DIAGNOSIS — Z886 Allergy status to analgesic agent status: Secondary | ICD-10-CM | POA: Insufficient documentation

## 2016-02-01 DIAGNOSIS — R599 Enlarged lymph nodes, unspecified: Secondary | ICD-10-CM | POA: Diagnosis not present

## 2016-02-01 DIAGNOSIS — G9340 Encephalopathy, unspecified: Principal | ICD-10-CM | POA: Diagnosis present

## 2016-02-01 DIAGNOSIS — I2510 Atherosclerotic heart disease of native coronary artery without angina pectoris: Secondary | ICD-10-CM | POA: Diagnosis not present

## 2016-02-01 DIAGNOSIS — R531 Weakness: Secondary | ICD-10-CM | POA: Diagnosis not present

## 2016-02-01 DIAGNOSIS — Z87891 Personal history of nicotine dependence: Secondary | ICD-10-CM | POA: Diagnosis not present

## 2016-02-01 DIAGNOSIS — Z7984 Long term (current) use of oral hypoglycemic drugs: Secondary | ICD-10-CM | POA: Diagnosis not present

## 2016-02-01 DIAGNOSIS — Z85038 Personal history of other malignant neoplasm of large intestine: Secondary | ICD-10-CM | POA: Diagnosis not present

## 2016-02-01 DIAGNOSIS — Z9221 Personal history of antineoplastic chemotherapy: Secondary | ICD-10-CM | POA: Insufficient documentation

## 2016-02-01 DIAGNOSIS — E872 Acidosis: Secondary | ICD-10-CM | POA: Insufficient documentation

## 2016-02-01 DIAGNOSIS — C787 Secondary malignant neoplasm of liver and intrahepatic bile duct: Secondary | ICD-10-CM | POA: Diagnosis not present

## 2016-02-01 DIAGNOSIS — E860 Dehydration: Secondary | ICD-10-CM | POA: Diagnosis present

## 2016-02-01 DIAGNOSIS — E162 Hypoglycemia, unspecified: Secondary | ICD-10-CM

## 2016-02-01 DIAGNOSIS — M6281 Muscle weakness (generalized): Secondary | ICD-10-CM

## 2016-02-01 DIAGNOSIS — Z1589 Genetic susceptibility to other disease: Secondary | ICD-10-CM | POA: Insufficient documentation

## 2016-02-01 DIAGNOSIS — R2681 Unsteadiness on feet: Secondary | ICD-10-CM | POA: Insufficient documentation

## 2016-02-01 DIAGNOSIS — R262 Difficulty in walking, not elsewhere classified: Secondary | ICD-10-CM

## 2016-02-01 DIAGNOSIS — C189 Malignant neoplasm of colon, unspecified: Secondary | ICD-10-CM

## 2016-02-01 LAB — COMPREHENSIVE METABOLIC PANEL
ALBUMIN: 2.1 g/dL — AB (ref 3.5–5.0)
ALT: 12 U/L — ABNORMAL LOW (ref 14–54)
ANION GAP: 8 (ref 5–15)
AST: 33 U/L (ref 15–41)
Alkaline Phosphatase: 114 U/L (ref 38–126)
BILIRUBIN TOTAL: 0.7 mg/dL (ref 0.3–1.2)
BUN: 12 mg/dL (ref 6–20)
CO2: 21 mmol/L — AB (ref 22–32)
Calcium: 8.6 mg/dL — ABNORMAL LOW (ref 8.9–10.3)
Chloride: 111 mmol/L (ref 101–111)
Creatinine, Ser: 0.89 mg/dL (ref 0.44–1.00)
GFR calc Af Amer: >60 mL/min (ref >60)
GFR calc non Af Amer: >60 mL/min (ref >60)
GLUCOSE: 70 mg/dL (ref 65–99)
POTASSIUM: 4.8 mmol/L (ref 3.5–5.1)
SODIUM: 140 mmol/L (ref 135–145)
TOTAL PROTEIN: 5.2 g/dL — AB (ref 6.5–8.1)

## 2016-02-01 LAB — URINALYSIS COMPLETE WITH MICROSCOPIC (ARMC ONLY)
BACTERIA UA: NONE SEEN
Bilirubin Urine: NEGATIVE
Glucose, UA: 50 mg/dL — AB
HGB URINE DIPSTICK: NEGATIVE
Ketones, ur: NEGATIVE mg/dL
LEUKOCYTES UA: NEGATIVE
Nitrite: NEGATIVE
PH: 5 (ref 5.0–8.0)
PROTEIN: NEGATIVE mg/dL
RBC / HPF: NONE SEEN RBC/hpf (ref 0–5)
SQUAMOUS EPITHELIAL / LPF: NONE SEEN
Specific Gravity, Urine: 1.015 (ref 1.005–1.030)

## 2016-02-01 LAB — URINE DRUG SCREEN, QUALITATIVE (ARMC ONLY)
Amphetamines, Ur Screen: NOT DETECTED
BARBITURATES, UR SCREEN: NOT DETECTED
BENZODIAZEPINE, UR SCRN: NOT DETECTED
CANNABINOID 50 NG, UR ~~LOC~~: NOT DETECTED
Cocaine Metabolite,Ur ~~LOC~~: NOT DETECTED
MDMA (ECSTASY) UR SCREEN: NOT DETECTED
Methadone Scn, Ur: NOT DETECTED
Opiate, Ur Screen: NOT DETECTED
PHENCYCLIDINE (PCP) UR S: NOT DETECTED
TRICYCLIC, UR SCREEN: NOT DETECTED

## 2016-02-01 LAB — CBC WITH DIFFERENTIAL/PLATELET
BASOS PCT: 1 %
Basophils Absolute: 0.1 10*3/uL (ref 0–0.1)
EOS ABS: 0 10*3/uL (ref 0–0.7)
EOS PCT: 0 %
HCT: 31.5 % — ABNORMAL LOW (ref 35.0–47.0)
HEMOGLOBIN: 10.2 g/dL — AB (ref 12.0–16.0)
LYMPHS ABS: 0.8 10*3/uL — AB (ref 1.0–3.6)
Lymphocytes Relative: 6 %
MCH: 30.6 pg (ref 26.0–34.0)
MCHC: 32.2 g/dL (ref 32.0–36.0)
MCV: 94.9 fL (ref 80.0–100.0)
Monocytes Absolute: 1.2 10*3/uL — ABNORMAL HIGH (ref 0.2–0.9)
Monocytes Relative: 8 %
NEUTROS PCT: 85 %
Neutro Abs: 12.7 10*3/uL — ABNORMAL HIGH (ref 1.4–6.5)
PLATELETS: 275 10*3/uL (ref 150–440)
RBC: 3.32 MIL/uL — AB (ref 3.80–5.20)
RDW: 16.9 % — ABNORMAL HIGH (ref 11.5–14.5)
WBC: 14.8 10*3/uL — AB (ref 3.6–11.0)

## 2016-02-01 LAB — GLUCOSE, CAPILLARY
GLUCOSE-CAPILLARY: 130 mg/dL — AB (ref 65–99)
GLUCOSE-CAPILLARY: 60 mg/dL — AB (ref 65–99)
GLUCOSE-CAPILLARY: 65 mg/dL (ref 65–99)
GLUCOSE-CAPILLARY: 81 mg/dL (ref 65–99)

## 2016-02-01 LAB — LACTIC ACID, PLASMA
LACTIC ACID, VENOUS: 3.4 mmol/L — AB (ref 0.5–1.9)
LACTIC ACID, VENOUS: 2.6 mmol/L — AB (ref 0.5–1.9)

## 2016-02-01 LAB — AMMONIA: AMMONIA: 15 umol/L (ref 9–35)

## 2016-02-01 LAB — ETHANOL

## 2016-02-01 LAB — TROPONIN I: Troponin I: <0.03 ng/mL (ref <0.03)

## 2016-02-01 MED ORDER — PIPERACILLIN-TAZOBACTAM 3.375 G IVPB 30 MIN
3.3750 g | Freq: Once | INTRAVENOUS | Status: AC
  Administered 2016-02-01: 3.375 g via INTRAVENOUS
  Filled 2016-02-01: qty 50

## 2016-02-01 MED ORDER — MEGESTROL ACETATE 400 MG/10ML PO SUSP
200.0000 mg | Freq: Every day | ORAL | Status: DC
  Administered 2016-02-02 – 2016-02-03 (×2): 200 mg via ORAL
  Filled 2016-02-01 (×2): qty 5

## 2016-02-01 MED ORDER — SODIUM CHLORIDE 0.9 % IV BOLUS (SEPSIS)
250.0000 mL | Freq: Once | INTRAVENOUS | Status: AC
  Administered 2016-02-01: 250 mL via INTRAVENOUS

## 2016-02-01 MED ORDER — MAGNESIUM OXIDE 400 (241.3 MG) MG PO TABS
400.0000 mg | ORAL_TABLET | Freq: Every day | ORAL | Status: DC
  Administered 2016-02-02 – 2016-02-03 (×2): 400 mg via ORAL
  Filled 2016-02-01 (×2): qty 1

## 2016-02-01 MED ORDER — ATORVASTATIN CALCIUM 20 MG PO TABS
40.0000 mg | ORAL_TABLET | Freq: Every day | ORAL | Status: DC
Start: 2016-02-02 — End: 2016-02-03
  Administered 2016-02-02 – 2016-02-03 (×2): 40 mg via ORAL
  Filled 2016-02-01 (×2): qty 2

## 2016-02-01 MED ORDER — ACETAMINOPHEN 650 MG RE SUPP: 650.0000 mg | Freq: Four times a day (QID) | RECTAL | Status: DC | PRN

## 2016-02-01 MED ORDER — INSULIN ASPART 100 UNIT/ML ~~LOC~~ SOLN
0.0000 [IU] | Freq: Three times a day (TID) | SUBCUTANEOUS | Status: DC
  Administered 2016-02-02: 2 [IU] via SUBCUTANEOUS
  Administered 2016-02-02: 12:00:00 1 [IU] via SUBCUTANEOUS
  Administered 2016-02-03: 2 [IU] via SUBCUTANEOUS
  Filled 2016-02-01: qty 1
  Filled 2016-02-01 (×2): qty 2

## 2016-02-01 MED ORDER — SODIUM CHLORIDE 0.9 % IV BOLUS (SEPSIS)
1000.0000 mL | Freq: Once | INTRAVENOUS | Status: AC
Start: 2016-02-01 — End: 2016-02-01
  Administered 2016-02-01: 1000 mL via INTRAVENOUS

## 2016-02-01 MED ORDER — ENOXAPARIN SODIUM 40 MG/0.4ML ~~LOC~~ SOLN
40.0000 mg | SUBCUTANEOUS | Status: DC
  Administered 2016-02-01 – 2016-02-02 (×2): 40 mg via SUBCUTANEOUS
  Filled 2016-02-01 (×2): qty 0.4

## 2016-02-01 MED ORDER — ACETAMINOPHEN 325 MG PO TABS: 650.0000 mg | ORAL_TABLET | Freq: Four times a day (QID) | ORAL | Status: DC | PRN

## 2016-02-01 MED ORDER — DEXTROSE-NACL 5-0.9 % IV SOLN
INTRAVENOUS | Status: DC
  Administered 2016-02-01 – 2016-02-02 (×2): via INTRAVENOUS

## 2016-02-01 MED ORDER — INSULIN ASPART 100 UNIT/ML ~~LOC~~ SOLN: 0.0000 [IU] | Freq: Every day | SUBCUTANEOUS | Status: DC

## 2016-02-01 MED ORDER — DEXTROSE 50 % IV SOLN
INTRAVENOUS | Status: AC
  Filled 2016-02-01: qty 50

## 2016-02-01 MED ORDER — ONDANSETRON HCL 4 MG/2ML IJ SOLN: 4.0000 mg | Freq: Four times a day (QID) | INTRAMUSCULAR | Status: DC | PRN

## 2016-02-01 MED ORDER — DEXTROSE 50 % IV SOLN
1.0000 | Freq: Once | INTRAVENOUS | Status: AC
  Administered 2016-02-01: 50 mL via INTRAVENOUS

## 2016-02-01 MED ORDER — MIRTAZAPINE 15 MG PO TABS: 15.0000 mg | ORAL_TABLET | Freq: Every day | ORAL | Status: DC

## 2016-02-01 MED ORDER — OXYCODONE HCL 5 MG PO TABS: 5.0000 mg | ORAL_TABLET | ORAL | Status: DC | PRN

## 2016-02-01 MED ORDER — SODIUM CHLORIDE 0.9 % IV SOLN: INTRAVENOUS | Status: DC

## 2016-02-01 MED ORDER — GABAPENTIN 100 MG PO CAPS
200.0000 mg | ORAL_CAPSULE | Freq: Every day | ORAL | Status: DC
  Administered 2016-02-01 – 2016-02-02 (×2): 200 mg via ORAL
  Filled 2016-02-01 (×2): qty 2

## 2016-02-01 MED ORDER — SODIUM CHLORIDE 0.9 % IV BOLUS (SEPSIS)
500.0000 mL | Freq: Once | INTRAVENOUS | Status: AC
  Administered 2016-02-01: 500 mL via INTRAVENOUS

## 2016-02-01 MED ORDER — ADULT MULTIVITAMIN W/MINERALS CH
1.0000 | ORAL_TABLET | Freq: Every day | ORAL | Status: DC
  Administered 2016-02-02 – 2016-02-03 (×2): 1 via ORAL
  Filled 2016-02-01 (×2): qty 1

## 2016-02-01 MED ORDER — ONDANSETRON HCL 4 MG PO TABS: 4.0000 mg | ORAL_TABLET | Freq: Four times a day (QID) | ORAL | Status: DC | PRN

## 2016-02-01 MED ORDER — ENSURE ENLIVE PO LIQD: 237.0000 mL | Freq: Two times a day (BID) | ORAL | Status: DC

## 2016-02-01 MED ORDER — ASPIRIN EC 81 MG PO TBEC
81.0000 mg | DELAYED_RELEASE_TABLET | Freq: Every day | ORAL | Status: DC
  Administered 2016-02-02 – 2016-02-03 (×2): 81 mg via ORAL
  Filled 2016-02-01 (×2): qty 1

## 2016-02-01 MED ORDER — LISINOPRIL 10 MG PO TABS
10.0000 mg | ORAL_TABLET | Freq: Every day | ORAL | Status: DC
  Administered 2016-02-02 – 2016-02-03 (×2): 10 mg via ORAL
  Filled 2016-02-01 (×2): qty 1

## 2016-02-01 MED ORDER — AMIODARONE HCL 200 MG PO TABS
200.0000 mg | ORAL_TABLET | Freq: Every day | ORAL | Status: DC
Start: 2016-02-01 — End: 2016-02-03
  Administered 2016-02-02 – 2016-02-03 (×2): 200 mg via ORAL
  Filled 2016-02-01 (×2): qty 1

## 2016-02-01 NOTE — ED Notes (Signed)
This RN and Elmo Putt, RN performed In and Out cath and changed patient into clean sheets and a clean brief. Pt's daughter states consent for in and out cath. Pt is noted to be more alert at this time and is answering some yes/no questions with head nods at this time.

## 2016-02-01 NOTE — Progress Notes (Signed)
   [place], [place] [address]  February 01, 2016  Patient:  Sandy [name] Date of Birth: 1945-04-26 Date of Visit:  02/01/2016  To Whom it May Concern:  Please excuse [name] from work from 02/01/2016 until 02/01/16 as he was with a loved one admitted to the [facility] for medical treatment       Please don't hesitate to contact me with questions or concerns by calling  [phone] and asking them to page me directly.   Regino Schultze, MD

## 2016-02-01 NOTE — ED Notes (Signed)
Report called to Kasey, RN. 

## 2016-02-01 NOTE — ED Notes (Signed)
Dr. Burlene Arnt at bedside at this time.

## 2016-02-01 NOTE — H&P (Signed)
[facility] NAME: Alverna Stuewe    MR#:  [id number]  DATE OF BIRTH:  1945/10/14   DATE OF ADMISSION:  02/01/2016  PRIMARY CARE PHYSICIAN: Harriett Neita Carp, MD   REQUESTING/REFERRING PHYSICIAN: McShane  CHIEF COMPLAINT:   Chief Complaint  Patient presents with  . Altered Mental Status    HISTORY OF PRESENT ILLNESS:  Aracelie Lauby  is a 70 y.o. female with a known history of Stage IV colon cancer with known liver metastases who is presenting with altered mental status. Patient unable to provide history given mental status. History obtained from caregiver (daughter) present at bedside. She states her mother has had a continued decline however progressively worsened today to the point where she was rather lethargic and not eating. Given decreased mental status brought to Hospital further workup and evaluation. No further symptomatology per daughter  PAST MEDICAL HISTORY:   Past Medical History:  Diagnosis Date  . Cancer ([place])    liver mets; colon cancer  . Constipation   . Diabetes mellitus without complication ([place])   . HOH (hard of hearing)   . Hypertension   . Insomnia   . 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 Sickle cell anemia (HCC)   . Wears dentures    partial upper and lower    PAST SURGICAL HISTORY:   Past Surgical History:  Procedure Laterality Date  . CARDIAC CATHETERIZATION    . COLONOSCOPY WITH PROPOFOL N/A 09/07/2014   Procedure: COLONOSCOPY WITH PROPOFOL;  Surgeon: Lucilla Lame, MD;  Location: [facility] ENDOSCOPY;  Service: Endoscopy;  Laterality: N/A;  . ESOPHAGOGASTRODUODENOSCOPY N/A 09/07/2014   Procedure: ESOPHAGOGASTRODUODENOSCOPY (EGD);  Surgeon: Lucilla Lame, MD;  Location: [facility] END[facility]COPY;  Service: Endoscopy;  Laterality: N/A;  . FLEXIBLE SIGMOIDOSCOPY N/A 09/02/2015   Procedure: FLEXIBLE SIGMOIDOSCOPY;  Surgeon: Lucilla Lame, MD;  Location: [place];  Service:  Endoscopy;  Laterality: N/A;  Diabetic - oral meds Pt has port-a-cath  . LIVER BIOPSY    . PORTACATH PLACEMENT Left 09/24/2014   Procedure: INSERTION PORT-A-CATH;  Surgeon: Robert Bellow, MD;  Location: [facility] ORS;  Service: General;  Laterality: Left;    SOCIAL HISTORY:   Social History  Substance Use Topics  . Smoking status: Former Smoker    Packs/day: 0.25    Years: 20.00    Types: Cigarettes    Quit date: 09/06/2013  . Smokeless tobacco: Never Used  . Alcohol use No    FAMILY HISTORY:   Family History  Problem Relation Age of Onset  . Cancer Sister     DRUG ALLERGIES:   Allergies  Allergen Reactions  . Tylenol [Acetaminophen] Nausea And Vomiting    REVIEW OF SYSTEMS:  Patient unable to provide information given mental status   MEDICATIONS AT HOME:   Prior to Admission medications   Medication Sig Start Date End Date Taking? Authorizing Provider  amiodarone (PACERONE) 400 MG tablet Take 0.5 tablets (200 mg total) by mouth 2 (two) times daily. Patient taking differently: Take 200 mg by mouth daily.  01/05/16  Yes Dustin Flock, MD  atorvastatin (LIPITOR) 40 MG tablet Take 40 mg by mouth daily.   Yes Historical Provider, MD  diphenoxylate-atropine (LOMOTIL) 2.5-0.025 MG tablet 1-2 tablets every 6 hours as needed for diarrhea 12/26/15  Yes Lequita Asal, MD  gabapentin (NEURONTIN) 100 MG capsule Take 2 capsules (200 mg total) by mouth at bedtime. 1 tab daily and  2 tabs at bedtime. Patient taking differently: Take 200 mg by mouth at bedtime. 1 tab in the morning daily and 2 capsules at bedtime. 07/16/15  Yes Lequita Asal, MD  lidocaine-prilocaine (EMLA) cream Apply 1 application topically as needed. Apply to port 1 hour prior to chemotherapy appointment. Cover with plastic wrap. 07/08/15  Yes Lequita Asal, MD  lisinopril (PRINIVIL,ZESTRIL) 10 MG tablet Take 10 mg by mouth daily.   Yes Historical Provider, MD  magnesium 30 MG tablet Take 30 mg by mouth  daily.    Yes Historical Provider, MD  megestrol (MEGACE) 400 MG/10ML suspension Take 5 mLs (200 mg total) by mouth daily. to stimulate appetite 11/06/14  Yes Lequita Asal, MD  metFORMIN (GLUCOPHAGE) 1000 MG tablet Take 1,000 mg by mouth 2 (two) times daily.    Yes Historical Provider, MD  Multiple Vitamin (MULTIVITAMIN WITH MINERALS) TABS tablet Take 1 tablet by mouth daily.   Yes Historical Provider, MD  ondansetron (ZOFRAN) 4 MG tablet Take 1 tablet (4 mg total) by mouth every 8 (eight) hours as needed for nausea or vomiting. 12/19/15  Yes Lequita Asal, MD  oxyCODONE (OXY IR/ROXICODONE) 5 MG immediate release tablet Take 1 tablet (5 mg total) by mouth every 6 (six) hours as needed for severe pain. 01/28/16  Yes Lequita Asal, MD  amiodarone (PACERONE) 100 MG tablet Take 100 mg by mouth daily.    Historical Provider, MD  aspirin EC 81 MG tablet Take 81 mg by mouth daily. Reported on 08/26/2015    Historical Provider, MD  feeding supplement, ENSURE ENLIVE, (ENSURE ENLIVE) LIQD Take 237 mLs by mouth 2 (two) times daily between meals. 10/22/15   Vaughan Basta, MD  levofloxacin (LEVAQUIN) 500 MG tablet Take 1 tablet (500 mg total) by mouth daily. Patient not taking: Reported on 02/01/2016 01/05/16   Dustin Flock, MD  metoprolol tartrate (LOPRESSOR) 25 MG tablet Take 1 tablet (25 mg total) by mouth 2 (two) times daily. Patient not taking: Reported on 02/01/2016 01/05/16   Dustin Flock, MD  traZODone (DESYREL) 50 MG tablet Take 0.5 tablets (25 mg total) by mouth at bedtime as needed for sleep. Patient not taking: Reported on 02/01/2016 10/22/15   Vaughan Basta, MD      VITAL SIGNS:  Blood pressure (!) 142/71, pulse 69, temperature 98.2 F (36.8 C), temperature source Oral, resp. rate 12, height 5\' 7"  (1.702 m), weight 67.2 kg (148 lb 2.4 oz), SpO2 100 %.  PHYSICAL EXAMINATION:   VITAL SIGNS: Vitals:   02/01/16 1830 02/01/16 1900  BP: 126/68 (!) 142/71  Pulse: 69     Resp: 14 12  Temp:     GENERAL:70 y.o.female moderate distress given mental status.  HEAD: Normocephalic, atraumatic.  EYES: Pupils equal, round, reactive to light. Unable to assess extraocular muscles given mental status/medical condition. No scleral icterus.  MOUTH: Moist mucosal membrane. Dentition intact. No abscess noted.  EAR, NOSE, THROAT: Clear without exudates. No external lesions.  NECK: Supple. No thyromegaly. No nodules. No JVD.  PULMONARY: Clear to ascultation, without wheeze rails or rhonci. No use of accessory muscles, Good respiratory effort. good air entry bilaterally CHEST: Nontender to palpation.  CARDIOVASCULAR: S1 and S2. Regular rate and rhythm. No murmurs, rubs, or gallops. No edema. Pedal pulses 2+ bilaterally.  GASTROINTESTINAL: Soft, nontender, nondistended. No masses. Positive bowel sounds. No hepatosplenomegaly.  MUSCULOSKELETAL: No swelling, clubbing, or edema. Range of motion full in all extremities.  NEUROLOGIC: Unable to assess given mental status/medical condition  SKIN: No ulceration, lesions, rashes, or cyanosis. Skin warm and dry. Turgor intact.  PSYCHIATRIC: Unable to assess given mental status/medical condition   LABORATORY PANEL:   CBC  Recent Labs Lab 02/01/16 1616  WBC 14.8*  HGB 10.2*  HCT 31.5*  PLT 275   ------------------------------------------------------------------------------------------------------------------  Chemistries   Recent Labs Lab 01/27/16 0850 02/01/16 1616  NA  --  140  K  --  4.8  CL  --  111  CO2  --  21*  GLUCOSE  --  70  BUN  --  12  CREATININE  --  0.89  CALCIUM  --  8.6*  MG 1.8  --   AST  --  33  ALT  --  12*  ALKPHOS  --  114  BILITOT  --  0.7   ------------------------------------------------------------------------------------------------------------------  Cardiac Enzymes  Recent Labs Lab 02/01/16 1616  TROPONINI <0.03    ------------------------------------------------------------------------------------------------------------------  RADIOLOGY:  Ct Head Wo Contrast  Result Date: 02/01/2016 CLINICAL DATA:  Altered mental status, somnolence/ lethargy 2 days. Metastatic colon cancer. EXAM: CT HEAD WITHOUT CONTRAST TECHNIQUE: Contiguous axial images were obtained from the base of the skull through the vertex without intravenous contrast. COMPARISON:  12/31/2015 . FINDINGS: Brain: Ventricles, cisterns and other CSF spaces are within normal. There is no mass, mass effect, shift of midline structures or acute hemorrhage. No evidence of acute infarction. Mild chronic ischemic microvascular disease is present. Calcification right basal ganglia. Vascular: Mild calcified plaque over the cavernous segment of the internal carotid arteries. Skull: Within normal. Sinuses/Orbits: Within normal. IMPRESSION: No acute intracranial findings per Chronic ischemic microvascular disease. Electronically Signed   By: Marin Olp M.D.   On: 02/01/2016 16:47   Dg Chest Port 1 View  Result Date: 02/01/2016 CLINICAL DATA:  Weakness EXAM: PORTABLE CHEST 1 VIEW COMPARISON:  01/02/2016 chest radiograph. FINDINGS: Left subclavian MediPort terminates over the right atrium, stable. Stable cardiomediastinal silhouette with top-normal heart size. No pneumothorax. No pleural effusion. Low lung volumes. No pulmonary edema. No acute consolidative airspace disease. IMPRESSION: Low lung volumes.  No active cardiopulmonary disease. Electronically Signed   By: Ilona Sorrel M.D.   On: 02/01/2016 17:07    EKG:   Orders placed or performed during the hospital encounter of 02/01/16  . ED EKG  . ED EKG    IMPRESSION AND PLAN:   [name] history of colon cancer stage IV with metastases to the liver who is presenting with altered mental status weakness  1.  Acute encephalopathy: No clear infectious etiology present-has  received Zosyn in emergency department no clear indication to continue at this time with antibiotics at neuro checks minimize sedating agents 2. Type 2 diabetes with hypoglycemia received 1 amp D50 which has improved hold oral agents continue sliding scale 3. Stage IV colon cancer consult oncology 4. Essential hypertension continue home medications  All the records are reviewed and case discussed with ED provider. Management plans discussed with the patient, family and they are in agreement.  CODE STATUS: Full  TOTAL TIME TAKING CARE OF THIS PATIENT: 33 minutes.    Cozy Veale,  Karenann Cai.D on 02/01/2016 at 7:14 PM  Between 7am to 6pm - Pager - [phone]  After 6pm: House Pager: - [facility]  Office  [phone]  CC: Primary care physician; [name], MD

## 2016-02-01 NOTE — ED Provider Notes (Addendum)
[facility] Emergency Department Provider Note  ____________________________________________   I have reviewed the triage vital signs and the nursing notes.   HISTORY  Chief Complaint Altered Mental Status    HPI Sandy [name] is a 70 y.o. female who is an oncology pt with chemotherapy, but none for a few weeks. Over the last few days, without a significant change in medication, patient become gradually more infused. No fever. No vomiting. She does have decreased by mouth at home.  Level 5 chart caveat; no further history available due to patient status.   Past Medical History:  Diagnosis Date  . Cancer ([place])    liver mets; colon cancer  . Constipation   . Diabetes mellitus without complication ([place])   . HOH (hard of hearing)   . Hypertension   . Insomnia   . 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 Sickle cell anemia (HCC)   . Wears dentures    partial upper and lower    Patient Active Problem List   Diagnosis Date Noted  . 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 Acute encephalopathy 12/31/2015  . Diarrhea 12/31/2015  . Pancytopenia ([place]) 12/31/2015  . DNR (do not resuscitate)   . Palliative care encounter   . Weakness generalized   . Poisoning by unspecified drug or medicinal substance, undetermined whether accidentally or purposely inflicted(E980.5)   . Antineoplastic chemotherapy induced pancytopenia ([place])   . Thyroid nodule 09/24/2015  . Personal history of colon cancer   . Thyroid nodule 08/30/2015  . Malignant neoplasm of sigmoid colon ([place]) 08/27/2015  . Hematochezia 06/10/2015  . Neuropathy ([place]) 04/29/2015  . Muscle pain, lumbar 02/12/2015  . Hypomagnesemia 01/15/2015  . Hypokalemia 10/23/2014  . Hyponatremia 10/21/2014  . Colon cancer metastasized to liver ([place]) 09/18/2014  . Cancer related pain 09/18/2014  . Dysuria 09/18/2014  . Anemia 09/18/2014  . Anorexia 09/18/2014  . Weight loss,  unintentional 09/18/2014  . Benign essential HTN 09/03/2014  . Type 2 diabetes mellitus ([place]) 09/03/2014  . Personal history of nicotine dependence 09/03/2014  . Combined fat and carbohydrate induced hyperlipemia 09/03/2014  . Chest pain 08/28/2014    Past Surgical History:  Procedure Laterality Date  . CARDIAC CATHETERIZATION    . COLONOSCOPY WITH PROPOFOL N/A 09/07/2014   Procedure: COLONOSCOPY WITH PROPOFOL;  Surgeon: Lucilla Lame, MD;  Location: [facility] ENDOSCOPY;  Service: Endoscopy;  Laterality: N/A;  . ESOPHAGOGASTRODUODENOSCOPY N/A 09/07/2014   Procedure: ESOPHAGOGASTRODUODENOSCOPY (EGD);  Surgeon: Lucilla Lame, MD;  [facility];  Service: Endoscopy;  Laterality: N/A;  . FLEXIBLE SIGMOIDOSCOPY N/A 09/02/2015   Procedure: FLEXIBLE SIGMOIDOSCOPY;  Surgeon: Lucilla Lame, MD;  Location: [place];  Service: Endoscopy;  Laterality: N/A;  Diabetic - oral meds Pt has port-a-cath  . LIVER BIOPSY    . PORTACATH PLACEMENT Left 09/24/2014   Procedure: INSERTION PORT-A-CATH;  Surgeon: Robert Bellow, MD;  Location: [facility] ORS;  Service: General;  Laterality: Left;    Prior to Admission medications   Medication Sig Start Date End Date Taking? Authorizing Provider  amiodarone (PACERONE) 100 MG tablet Take 100 mg by mouth daily.    Historical Provider, MD  amiodarone (PACERONE) 400 MG tablet Take 0.5 tablets (200 mg total) by mouth 2 (two) times daily. 01/05/16   Dustin Flock, MD  aspirin EC 81 MG tablet Take 81 mg by mouth daily. Reported on 08/26/2015    Historical Provider, MD  atorvastatin (LIPITOR) 40 MG tablet  Take 40 mg by mouth daily.    Historical Provider, MD  diphenoxylate-atropine (LOMOTIL) 2.5-0.025 MG tablet 1-2 tablets every 6 hours as needed for diarrhea 12/26/15   Lequita Asal, MD  feeding supplement, ENSURE ENLIVE, (ENSURE ENLIVE) LIQD Take 237 mLs by mouth 2 (two) times daily between meals. 10/22/15   Vaughan Basta, MD  gabapentin (NEURONTIN) 100  MG capsule Take 2 capsules (200 mg total) by mouth at bedtime. 1 tab daily and 2 tabs at bedtime. Patient taking differently: Take 200 mg by mouth at bedtime. 1 tab in the morning daily and 2 capsules at bedtime. 07/16/15   Lequita Asal, MD  levofloxacin (LEVAQUIN) 500 MG tablet Take 1 tablet (500 mg total) by mouth daily. 01/05/16   Dustin Flock, MD  lidocaine-prilocaine (EMLA) cream Apply 1 application topically as needed. Apply to port 1 hour prior to chemotherapy appointment. Cover with plastic wrap. 07/08/15   Lequita Asal, MD  lisinopril (PRINIVIL,ZESTRIL) 10 MG tablet Take 10 mg by mouth daily.    Historical Provider, MD  magnesium 30 MG tablet Take 30 mg by mouth daily.     Historical Provider, MD  megestrol (MEGACE) 400 MG/10ML suspension Take 5 mLs (200 mg total) by mouth daily. to stimulate appetite 11/06/14   Lequita Asal, MD  metFORMIN (GLUCOPHAGE) 1000 MG tablet Take 1,000 mg by mouth 2 (two) times daily.     Historical Provider, MD  metoprolol tartrate (LOPRESSOR) 25 MG tablet Take 1 tablet (25 mg total) by mouth 2 (two) times daily. Patient not taking: Reported on 01/27/2016 01/05/16   Dustin Flock, MD  Multiple Vitamin (MULTIVITAMIN WITH MINERALS) TABS tablet Take 1 tablet by mouth daily.    Historical Provider, MD  ondansetron (ZOFRAN) 4 MG tablet Take 1 tablet (4 mg total) by mouth every 8 (eight) hours as needed for nausea or vomiting. 12/19/15   Lequita Asal, MD  oxyCODONE (OXY IR/ROXICODONE) 5 MG immediate release tablet Take 1 tablet (5 mg total) by mouth every 6 (six) hours as needed for severe pain. 01/28/16   Lequita Asal, MD  traZODone (DESYREL) 50 MG tablet Take 0.5 tablets (25 mg total) by mouth at bedtime as needed for sleep. 10/22/15   Vaughan Basta, MD    Allergies Tylenol [acetaminophen]  Family History  Problem Relation Age of Onset  . Cancer Sister     Social History Social History  Substance Use Topics  . Smoking status:  Former Smoker    Packs/day: 0.25    Years: 20.00    Types: Cigarettes    Quit date: 09/06/2013  . Smokeless tobacco: Never Used  . Alcohol use No    Review of Systems Most answers per daughter, otherwise cLevel 5 chart caveat; no further history available due to patient status. Constitutional: No fever/chills Eyes: No visual changes. ENT: No sore throat. No stiff neck no neck pain Cardiovascular: Denies chest pain. Respiratory: Denies shortness of breath. Gastrointestinal:   no vomiting.  No diarrhea.  No constipation. Genitourinary: Negative for dysuria. Musculoskeletal: Negative lower extremity swelling Skin: Negative for rash. Neurological: Negative for severe headaches, focal weakness or numbness. 10-point ROS otherwise negative.  ____________________________________________   PHYSICAL EXAM:  VITAL SIGNS: ED Triage Vitals  Enc Vitals Group     BP 02/01/16 1701 123/63     Pulse Rate 02/01/16 1701 72     Resp 02/01/16 1701 18     Temp 02/01/16 1701 98.2 F (36.8 C)     Temp  Source 02/01/16 1701 Oral     SpO2 02/01/16 1701 99 %     Weight 02/01/16 1702 148 lb 2.4 oz (67.2 kg)     Height 02/01/16 1702 5\' 7"  (1.702 m)     Head Circumference --      Peak Flow --      Pain Score --      Pain Loc --      Pain Edu? --      Excl. in [place]? --     Constitutional: Usually, patient awake but quite lethargic, chronically ill-appearing Eyes: Conjunctivae are normal. PERRL. EOMI. Head: Atraumatic. Nose: No congestion/rhinnorhea. Mouth/Throat: Mucous membranes are dry.  Oropharynx non-erythematous. Neck: No stridor.   Nontender with no meningismus Cardiovascular: Normal rate, regular rhythm. Grossly normal heart sounds.  Good peripheral circulation. Respiratory: Normal respiratory effort.  No retractions. Lungs CTAB. Abdominal: Soft and nontender. No distention. No guarding no rebound Back:  There is no focal tenderness or step off.  there is no midline tenderness there are no  lesions noted. there is no CVA tenderness Musculoskeletal: No lower extremity tenderness, no upper extremity tenderness. No joint effusions, no DVT signs strong distal pulses no edema Neurologic:  Normal speech and language. No gross focal neurologic deficits are appreciated.  Skin:  Skin is warm, dry and intact. No rash noted. Pale Psychiatric: Mood and affect are normal. Speech and behavior are normal.  ____________________________________________   LABS (all labs ordered are listed, but only abnormal results are displayed)  Labs Reviewed  COMPREHENSIVE METABOLIC PANEL - Abnormal; Notable for the following:       Result Value   CO2 21 (*)    Calcium 8.6 (*)    Total Protein 5.2 (*)    Albumin 2.1 (*)    ALT 12 (*)    All other components within normal limits  URINALYSIS COMPLETEWITH MICROSCOPIC ([facility] ONLY) - Abnormal; Notable for the following:    Color, Urine YELLOW (*)    APPearance CLEAR (*)    Glucose, UA 50 (*)    All other components within normal limits  CBC WITH DIFFERENTIAL/PLATELET - Abnormal; Notable for the following:    WBC 14.8 (*)    RBC 3.32 (*)    Hemoglobin 10.2 (*)    HCT 31.5 (*)    RDW 16.9 (*)    Neutro Abs 12.7 (*)    Lymphs Abs 0.8 (*)    Monocytes Absolute 1.2 (*)    All other components within normal limits  LACTIC ACID, PLASMA - Abnormal; Notable for the following:    Lactic Acid, Venous 3.4 (*)    All other components within normal limits  GLUCOSE, CAPILLARY - Abnormal; Notable for the following:    Glucose-Capillary 60 (*)    All other components within normal limits  CULTURE, BLOOD (ROUTINE X 2)  CULTURE, BLOOD (ROUTINE X 2)  URINE CULTURE  AMMONIA  ETHANOL  TROPONIN I  URINE DRUG SCREEN, QUALITATIVE ([facility] ONLY)  LACTIC ACID, PLASMA   ____________________________________________  EKG  I personally interpreted any EKGs ordered by me or triage Normal sinus rhythm rate 71 bpm no acute ST elevation or depression is ST changes,  flipped T waves noted laterally. ____________________________________________  [id number]  I reviewed any imaging ordered by me or triage that were performed during my shift and, if possible, patient and/or family made aware of any abnormal findings. ____________________________________________   PROCEDURES  Procedure(s) performed: None  Procedures  Critical Care performed: None  ____________________________________________  INITIAL IMPRESSION / ASSESSMENT AND PLAN / ED COURSE  Pertinent labs & imaging results that were available during my care of the patient were reviewed by me and considered in my medical decision making (see chart for details).  She presents with lethargy. We did replete her glucose which was 60. We also give her IV fluids at this time she has much more awake and alert. This is likely secondary to dehydration as she has great difficulty difficulty keeping herself hydrated home. Abdomen is benign. No obvious source of infection she is afebrile. Discussed with Dr.Perumandla who is on-call for oncology. She did agree with Zosyn. Empirically. She agreed with admission for IV hydration. Lactic acid is elevated white count is elevated. However, she is doing very well this time. There are many different reasons for elevated lactate, patient had elevated lactate before. There is no other real marker for sepsis in terms of her vital signs, her white count is somewhat up but is been much more elevated in the past. She has had similar presentations without clear infectious etiology and is no obvious source of infection. We will give her hydration as I think she needs it, we will give empiric antibiotics because of a history of some degree of immunocompromise from prior chemotherapy although she has not had it in several weeks, and we will admit her to the hospital. Upon admission, patient is alert and doing much better after some early IV fluids.    Clinical Course    ____________________________________________   FINAL CLINICAL IMPRESSION(S) / ED DIAGNOSES  Final diagnoses:  Weakness  Confusion  Dehydration  Hypoglycemia      This chart was dictated using voice recognition software.  Despite best efforts to proofread,  errors can occur which can change meaning.      Schuyler Amor, MD 02/01/16 [place], MD 02/01/16 [phone]

## 2016-02-01 NOTE — ED Notes (Signed)
Dr. Burlene Arnt is aware that pt's family would like to speak with him.

## 2016-02-01 NOTE — ED Notes (Signed)
This RN assisted patient family in changing patient and removing soiled brief. Pt tolerated well at this time. NAD noted at this time. Pt is noted to be alert, UTA orientation.

## 2016-02-01 NOTE — Progress Notes (Signed)
   [place] [facility] [place] [facility] Day: 0 days Sandy [name] is a 70 y.o. female presenting with Altered Mental Status .   Advance care planning discussed with patient  with additional Family at bedside. All questions in regards to overall condition and expected prognosis answered. The decision was made to continue current code status  CODE STATUS: full Time spent: 25 minutes

## 2016-02-02 ENCOUNTER — Observation Stay: Payer: Medicare Other

## 2016-02-02 LAB — GLUCOSE, CAPILLARY
GLUCOSE-CAPILLARY: 86 mg/dL (ref 65–99)
GLUCOSE-CAPILLARY: 102 mg/dL — AB (ref 65–99)
GLUCOSE-CAPILLARY: 144 mg/dL — AB (ref 65–99)
GLUCOSE-CAPILLARY: 120 mg/dL — AB (ref 65–99)
Glucose-Capillary: 193 mg/dL — ABNORMAL HIGH (ref 65–99)

## 2016-02-02 LAB — LACTIC ACID, PLASMA: LACTIC ACID, VENOUS: 1.5 mmol/L (ref 0.5–1.9)

## 2016-02-02 MED ORDER — IOPAMIDOL (ISOVUE-300) INJECTION 61%
30.0000 mL | Freq: Once | INTRAVENOUS | Status: AC | PRN
  Administered 2016-02-02: 30 mL via ORAL

## 2016-02-02 MED ORDER — IOHEXOL 300 MG/ML  SOLN: 30.0000 mL | Freq: Once | INTRAMUSCULAR | Status: DC | PRN

## 2016-02-02 MED ORDER — SODIUM CHLORIDE 0.9 % IV SOLN
INTRAVENOUS | Status: DC
  Administered 2016-02-02: 18:00:00 via INTRAVENOUS

## 2016-02-02 MED ORDER — BOOST / RESOURCE BREEZE PO LIQD
1.0000 | Freq: Three times a day (TID) | ORAL | Status: DC
  Administered 2016-02-02 – 2016-02-03 (×4): 1 via ORAL

## 2016-02-02 MED ORDER — IOPAMIDOL (ISOVUE-300) INJECTION 61%
100.0000 mL | Freq: Once | INTRAVENOUS | Status: AC | PRN
  Administered 2016-02-02: 100 mL via INTRAVENOUS

## 2016-02-02 NOTE — Consult Note (Signed)
Consult requested for metastatic colon cancer  HPI: This is a 70 yr old lady who was diagnosed with colon cancer with extensive liver metastasis in 08/2015. Extended RAS testing could not be done due to inadequate tissue , so RAS status is unknown. She received FOLFIRI in the past with good response but had to be held since 12/2015 due to neutropenia and hospitalization for sepsis.  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 She meanwhile ended up once again in the hospital with complaints as reported by daughter as begin increasingly lethragic, and poorly communicative . She was noted to have a blood sugar of 60, Imaging of brian was negative for acute ischemia or bleed She had no signs of infection, but lactic acid was high, so she was started on empiric antibiotic. A repeat lactic acid level today is normal Herr sugars have stabilized But her mental status remains the same. She is asleep,repssondign very slowly to verbal commands,and disoriented. No focal deficits in motor function have been noted. She does follow simple verbal commands Her daughter states she had eaten her lunch today. It is unclear as to what her baseline functional status has been but as per daughter,s he has been active and did not need any help with AdLss,until 3 days ago.   Cbc, urine analysis, urine cultures, blood cultures are no growth so far  Ct head 02/01/16- neg CXR 02/01/16 neg   cyccle 4 folfiri given on 12/23/15 dose reduced  Cycle 5 was planned for 10/16 but not given due to neutroepania and hospitrala dmissionf ro AMS. LAST CT ABD 11/05/15 MULTPLE LIVER METS Component     Latest Ref Rng & Units 12/09/2015  CEA     0.0 - 4.7 ng/mL 532.5 (H)   Component     Latest Ref Rng & Units 12/23/2015  CEA     0.0 - 4.7 ng/mL 641.3 (H)    Component     Latest Ref Rng & Units 01/27/2016   CEA     0.0 - 4.7 ng/mL 1,268.0 (H)     Impression and Plan:  Metastatic colon cancer with progressive disease.  AMS and hypoglycemia without clear evidence fo infection All sedatives and antipsychotics are to be held Repeat CT chest/ABD/pelvis to assess the status of her disease.  Her cause of decline in performance status, particularly her mental status is unclear but could be due to worsening dementia and depression. Nevertheless, she is high risk to offer chemotherapy at this time. Her RAS status is unknown to try EGFR directed therapy.  Discussed with her daughter who is at her bedside. She feels anxious and frustrated that her mother is'nt getting any better and that she should be getting her chemotherapy asap and that she disagrees with how she has been managed so far. I went over her last scans, and the rationale for holding back her chemotherapy. I explained ot her that chemo can be very toxic and can cause death if given in situations where infection is suspected and that patients with poor performance status do not benefit from chemotherapy I reviewed her CEA results, explained to her that her mother is terminally ill and that there is no cure for this disease. I spent about 45 minutes counseling at her bedside.. Patient was asleep and did not participate in the coversation. She does not wish to consider giving up on cancer  directed therapy at this time Will discuss with Dr. Mike Gip tomorrow AM  I explained to her the  A

## 2016-02-02 NOTE — Progress Notes (Addendum)
Advanced Care Planning  Purpose of Encounter: Code status and goal of care. Parties in Attendance: patient's daughter [facility] and caregiver) and I. Patient's Decisional Capacity: no capacity at this time.  Subjective/Patient Story:   Sandy [name]  is a 70 y.o. female with a known history of Stage IV colon cancer with known liver metastases who is presenting with altered mental status. Patient is unable to provide history given mental status. The patient has continued to decline. She got chemotherapy 1-2 months ago. She was in hospice care before.  Objective/Medical Story:  1.  Acute encephalopathy:  received Zosyn in emergency department no clear indication to continue at this time with antibiotics at neuro checks minimize sedating agents 2. Type 2 diabetes with hypoglycemia, she received 1 amp D50 which has improved hold oral agents continue sliding scale 3. Stage IV colon cancer. Progressing, get chest, abdomen and pelvic CT per on call oncologist. I discussed with on call oncologist.  Goals of Care Determinations: keep FULL CODE, continue current treatment, follow up CT and oncologist's further recommendation.  Plan:  Code Status: the daughter still wants FULL CODE.  Other documents completed:  See progress note. Time spent discussing advance care planning: 32 minutes.

## 2016-02-02 NOTE — Progress Notes (Signed)
PT Cancellation Note  Patient Details Name: Sandy [name] MRN: [id number] DOB: 07/02/1945   Cancelled Treatment:    Reason Eval/Treat Not Completed: Patient's level of consciousness (Nursing reporting pt has been sleeping all day).  Re-attempt later today or tomorrow.   Ramond Dial 02/02/2016, 2:44 PM    Mee Hives, PT MS Acute Rehab Dept. Number: [place] [facility] [place]

## 2016-02-02 NOTE — Progress Notes (Signed)
[place] [facility] [place] NAME: Sandy [name]    MR#:  [id number]  DATE OF BIRTH:  1946-01-19  SUBJECTIVE:  CHIEF COMPLAINT:   Chief Complaint  Patient presents with  . Altered Mental Status   Pt is more awake but drowsy. REVIEW OF SYSTEMS:  Review of Systems  Unable to perform ROS: Mental status change    DRUG ALLERGIES:   Allergies  Allergen Reactions  . Tylenol [Acetaminophen] Nausea And Vomiting   VITALS:  Blood pressure 133/65, pulse 76, temperature 98.3 F (36.8 C), temperature source Oral, resp. rate 18, height 5\' 7"  (1.702 m), weight 148 lb 2.4 oz (67.2 kg), SpO2 100 %. PHYSICAL EXAMINATION:  Physical Exam  Constitutional: She is well-developed, well-nourished, and in no distress. No distress.  Drowsy.  HENT:  Head: Normocephalic.  Mouth/Throat: Oropharynx is clear and moist.  Eyes: Conjunctivae and EOM are normal. No scleral icterus.  Neck: Normal range of motion. Neck supple. No JVD present. No thyromegaly present.  Cardiovascular: Normal rate, regular rhythm and normal heart sounds.  Exam reveals no gallop.   No murmur heard. Pulmonary/Chest: Effort normal and breath sounds normal. No respiratory distress. She has no wheezes. She has no rales.  Abdominal: She exhibits no distension. There is no tenderness.  Musculoskeletal: She exhibits no edema or tenderness.  Neurological: No cranial nerve deficit.  Drowsy, AAO X 2  Skin: Skin is dry.   LABORATORY PANEL:   CBC  Recent Labs Lab 02/01/16 1616  WBC 14.8*  HGB 10.2*  HCT 31.5*  PLT 275   ------------------------------------------------------------------------------------------------------------------ Chemistries   Recent Labs Lab 01/27/16 0850 02/01/16 1616  NA  --  140  K  --  4.8  CL  --  111  CO2  --  21*  GLUCOSE  --  70  BUN  --  12  CREATININE  --  0.89  CALCIUM  --  8.6*  MG 1.8  --   AST  --  33  ALT  --  12*  ALKPHOS  --  114    BILITOT  --  0.7   RADIOLOGY:  Ct Head Wo Contrast  Result Date: 02/01/2016 CLINICAL DATA:  Altered mental status, somnolence/ lethargy 2 days. Metastatic colon cancer. EXAM: CT HEAD WITHOUT CONTRAST TECHNIQUE: Contiguous axial images were obtained from the base of the skull through the vertex without intravenous contrast. COMPARISON:  12/31/2015 . FINDINGS: Brain: Ventricles, cisterns and other CSF spaces are within normal. There is no mass, mass effect, shift of midline structures or acute hemorrhage. No evidence of acute infarction. Mild chronic ischemic microvascular disease is present. Calcification right basal ganglia. Vascular: Mild calcified plaque over the cavernous segment of the internal carotid arteries. Skull: Within normal. Sinuses/Orbits: Within normal. IMPRESSION: No acute intracranial findings per Chronic ischemic microvascular disease. Electronically Signed   By: Marin Olp M.D.   On: 02/01/2016 16:47   Dg Chest Port 1 View  Result Date: 02/01/2016 CLINICAL DATA:  Weakness EXAM: PORTABLE CHEST 1 VIEW COMPARISON:  01/02/2016 chest radiograph. FINDINGS: Left subclavian MediPort terminates over the right atrium, stable. Stable cardiomediastinal silhouette with top-normal heart size. No pneumothorax. No pleural effusion. Low lung volumes. No pulmonary edema. No acute consolidative airspace disease. IMPRESSION: Low lung volumes.  No active cardiopulmonary disease. Electronically Signed   By: Ilona Sorrel M.D.   On: 02/01/2016 17:07   ASSESSMENT AND PLAN:   [name] history of colon cancer stage IV with metastases  to the liver who is presenting with altered mental status weakness  1.  Acute encephalopathy: possible due to hypoglycemia. received Zosyn, neuro checks and minimize sedating agents  2. Type 2 diabetes with hypoglycemia,  received 1 amp D50 which has improved, hold oral agents continue sliding scale 3. Stage IV colon cancer consult  oncology 4. Essential hypertension continue home medications  Lactic acidosis. Improved. Leukocytosis. Unclear etiology, no source of infection. She has been treated with zosyn. F/u CBC and blood cultrue.  All the records are reviewed and case discussed with Care Management/Social Worker. Management plans discussed with the patient, family and they are in agreement.  CODE STATUS: full code.  TOTAL TIME TAKING CARE OF THIS PATIENT: 35 minutes.   More than 50% of the time was spent in counseling/coordination of care: YES  POSSIBLE D/C IN 1-2 DAYS, DEPENDING ON CLINICAL CONDITION.   Demetrios Loll M.D on 02/02/2016 at 11:43 AM  Between 7am to 6pm - Pager - [phone]  After 6pm go to [email or web address] - Proofreader  [facility]  Office  [phone]  CC: Primary care physician; [name], MD  Note: This dictation was prepared with Dragon dictation along with smaller phrase technology. Any transcriptional errors that result from this process are unintentional.

## 2016-02-02 NOTE — Care Management Obs Status (Signed)
[facility] NOTIFICATION   Patient Details  Name: DESHUNDRA FORMBY MRN: [id number] Date of Birth: [birth year]   Medicare Observation Status Notification Given:  Yes    Carles Collet, RN 02/02/2016, 11:26 AM

## 2016-02-02 NOTE — Progress Notes (Signed)
Initial Nutrition Assessment  DOCUMENTATION CODES:   Not applicable  INTERVENTION:  D/C Ensure Enlive po BID, each supplement provides 350 kcal and 20 grams of protein Boost Breeze po TID, each supplement provides 250 kcal and 9 grams of protein  Will order snacks including: pudding, jello, apple sauce, and [place] sandwiches for patient Provided comfort and prayer for patient during this difficult time in her life.  NUTRITION DIAGNOSIS:   Increased nutrient needs related to cancer and cancer related treatments as evidenced by estimated needs.  GOAL:   Patient will meet greater than or equal to 90% of their needs  MONITOR:   PO intake, I & O's, Labs, Weight trends, Supplement acceptance  REASON FOR ASSESSMENT:   Consult Assessment of nutrition requirement/status  ASSESSMENT:   Sandy [name]  is a 70 y.o. female with a known history of Stage IV colon cancer with known liver metastases who is presenting with altered mental status. Patient unable to provide history given mental status. History obtained from caregiver (daughter) present at bedside. She states her mother has had a continued decline however progressively worsened today to the point where she was rather lethargic and not eating.  Ms. Dahlen is a somnolent lady who did not awake during my visit. Spoke with pt's daughter is the primary caregiver and provider for patient. She states that patient has continued to maintain her weight for several months, with good PO intake and appetite. Per chart review, 08/2014 patient was 123#, significant wt gain and maintenance at this time. Daughter does endorse patients feet swelling some as of recent, but otherwise no edema accumulations regularly. Daughter also reports patient has exhibited signs of lactose intolerance as of recent and is not tolerating ensure, will switch to boost breeze.  She denies any concerns chewing/swallowing/choking. Nutrition-Focused physical exam  completed. Findings are moderate fat depletion, mild-moderate muscle depletion, and no edema.   Labs and medications reviewed: Phos 1.8 CBGs 108-144 Megace, Mag-Ox, MVI w/ Minerals D5 @ 163mL/hr --> 408 calories  Diet Order:  Diet regular Room service appropriate? Yes; Fluid consistency: Thin  Skin:  Reviewed, no issues  Last BM:  PTA  Height:   Ht Readings from Last 1 Encounters:  02/01/16 5\' 7"  (1.702 m)    Weight:   Wt Readings from Last 1 Encounters:  02/01/16 148 lb 2.4 oz (67.2 kg)    Ideal Body Weight:  61.36 kg  BMI:  Body mass index is 23.2 kg/m.  Estimated Nutritional Needs:   Kcal:  2000-2350 calories  Protein:  87-115 gm  Fluid:  >/= 2L  EDUCATION NEEDS:   No education needs identified at this time  Satira Anis. Daijon Wenke, MS, RD LDN Inpatient Clinical Dietitian Pager [phone]

## 2016-02-03 LAB — HEMOGLOBIN A1C
Hgb A1c MFr Bld: 4.8 % (ref 4.8–5.6)
MEAN PLASMA GLUCOSE: 91 mg/dL

## 2016-02-03 LAB — CBC
HEMATOCRIT: 25 % — AB (ref 35.0–47.0)
HEMOGLOBIN: 8.5 g/dL — AB (ref 12.0–16.0)
MCH: 31.2 pg (ref 26.0–34.0)
MCHC: 33.9 g/dL (ref 32.0–36.0)
MCV: 92.1 fL (ref 80.0–100.0)
Platelets: 227 10*3/uL (ref 150–440)
RBC: 2.72 MIL/uL — AB (ref 3.80–5.20)
RDW: 16.7 % — ABNORMAL HIGH (ref 11.5–14.5)
WBC: 9.3 10*3/uL (ref 3.6–11.0)

## 2016-02-03 LAB — URINE CULTURE: Culture: NO GROWTH

## 2016-02-03 LAB — GLUCOSE, CAPILLARY
GLUCOSE-CAPILLARY: 191 mg/dL — AB (ref 65–99)
Glucose-Capillary: 78 mg/dL (ref 65–99)

## 2016-02-03 NOTE — Progress Notes (Signed)
Visit made. Patient is currently followed by [facility] with a  diagnosis of Colon cancer. She is a Full Code. She receives skilled nursing and social work services. CMRN Hassan Rowan aware of need for resumption of care orders. Patient admitted to [facility] on 1-/21 for assessment of lethargy/encephalopathy. She has received 2 doses of IV antibiotics as well as IV fluids.  Patient seen lying in bed, awake, reports feeling better. She did eat some breakfast, daughter [name] at bedside. Per discussion with CMRN Hassan Rowan plan is for discharge home today. Oncology consult completed, chest and abdominal CT completed on 10/22. [name] is waiting to speak with patient's oncologist Dr. Mike Gip for results. [facility] team alerted to discharge. Patient will require EMS transport home. Thank you. Flo Shanks RN, BSN, [facility] Liaison [phone] c

## 2016-02-03 NOTE — Discharge Instructions (Signed)
Heart healthy and ADA diet. Fall precaution. Resume home health.

## 2016-02-03 NOTE — Care Management (Addendum)
Admitted to [facility] with the diagnosis of acute encephalopathy under observation status. Discharged from this facility 01/05/16. Lives with daughter, Samai Almada [phone]). Last seen Dr. Mike Gip at [facility] 01/27/16. Dr. Quay Burow is listed as primary care physician. [facility] in the past. Established [facility] client. Will update Flo Shanks RN representative for [employer].  Shower chair and cane in the home. Daughter helps with Sandy [name]'s basic activities of daily living 24/7. Transported home per [facility] 01/05/16 and today 02/03/16 per daughter's request. Discharge to home today per Dr. Bridgett Larsson.  Shelbie Ammons RN MSN CCM Care Management [phone]

## 2016-02-03 NOTE — Evaluation (Signed)
Physical Therapy Evaluation Patient Details Name: Sandy [name] MRN: [id number] DOB: March 06, 1946 Today's Date: 02/03/2016   History of Present Illness  Pt is a 70 y/o F with a known history of Stage IV colon cancer with known liver metastases who presented with altered mental status.  Pt's PMH includes HOH, sickle cell anemia.    Clinical Impression  Pt admitted with above diagnosis. Pt currently with functional limitations due to the deficits listed below (see PT Problem List). Sandy [name] presents at her baseline level of mobility with her daughter present who is her primary caregiver.  She requires min assist for bed mobility and short distance ambulation in room (10 ft).  She requires max verbal and tactile cues for safe technique.  She has received HHPT in the past and will benefit from HHPT to address generalized weakness and safety with transfers. Pt will benefit from skilled PT to increase their independence and safety with mobility to allow discharge to the venue listed below.      Follow Up Recommendations Home health PT    Equipment Recommendations  None recommended by PT    Recommendations for Other Services OT consult     Precautions / Restrictions Precautions Precautions: Fall Restrictions Weight Bearing Restrictions: No      Mobility  Bed Mobility Overal bed mobility: Needs Assistance Bed Mobility: Supine to Sit;Sit to Supine     Supine to sit: Min assist Sit to supine: Min assist   General bed mobility comments: Verbal cues for technique and sequencing and min assist to advance BLEs off bed and to elevate trunk.  Transfers Overall transfer level: Needs assistance Equipment used: Rolling walker (2 wheeled) Transfers: Sit to/from Stand Sit to Stand: Min assist         General transfer comment: Assist to boost from standing and cues for hand placement and safe technique  Ambulation/Gait Ambulation/Gait assistance: Min assist Ambulation Distance  (Feet): 10 Feet Assistive device: Rolling walker (2 wheeled) Gait Pattern/deviations: Step-through pattern;Decreased stride length;Trunk flexed Gait velocity: decreased Gait velocity interpretation: <1.8 ft/sec, indicative of risk for recurrent falls General Gait Details: Flexed trunk despite max verbal and tactile cues for upright posture.  Assist managing RW.  Pt fatigues quickly.  Stairs Stairs:  (Per daughter, pt will be going home with ambulance transport)          Wheelchair Mobility    Modified Rankin (Stroke Patients Only)       Balance Overall balance assessment: Needs assistance;History of Falls (~1 fall/month) Sitting-balance support: Feet supported;Single extremity supported Sitting balance-Leahy Scale: Fair Sitting balance - Comments: 1 UE support due to anterior lean   Standing balance support: Bilateral upper extremity supported;During functional activity Standing balance-Leahy Scale: Poor Standing balance comment: Relies on RW for support with flexed posture                             Pertinent Vitals/Pain Pain Assessment: No/denies pain    Home Living Family/patient expects to be discharged to:: Private residence Living Arrangements: Children Available Help at Discharge: Family;Available 24 hours/day (daughter is caregiver) Type of Home: Apartment Home Access: Stairs to enter   Entrance Stairs-Number of Steps: 3 Home Layout: One level Home Equipment: Walker - 2 wheels;Bedside commode;Wheelchair - manual      Prior Function Level of Independence: Needs assistance   Gait / Transfers Assistance Needed: Pt. uses RW for limited household ambulation and mobilty   ADL's / [employer]  Needed: Needs assist with dressing, sponge baths        Hand Dominance        Extremity/Trunk Assessment   Upper Extremity Assessment: Generalized weakness           Lower Extremity Assessment: Generalized weakness      Cervical /  Trunk Assessment: Kyphotic  Communication   Communication: HOH  Cognition Arousal/Alertness: Lethargic Behavior During Therapy: Flat affect Overall Cognitive Status: History of cognitive impairments - at baseline                      General Comments      Exercises General Exercises - Lower Extremity Ankle Circles/Pumps: AROM;Both;10 reps;Supine Straight Leg Raises: AROM;Both;5 reps;Supine   Assessment/Plan    PT Assessment Patient needs continued PT services  PT Problem List Decreased strength;Decreased activity tolerance;Decreased balance;Decreased mobility;Decreased cognition;Decreased knowledge of use of DME;Decreased safety awareness          PT Treatment Interventions DME instruction;Gait training;Functional mobility training;Therapeutic activities;Therapeutic exercise;Balance training;Stair training;Neuromuscular re-education;Cognitive remediation;Patient/family education    PT Goals (Current goals can be found in the Care Plan section)  Acute Rehab PT Goals Patient Stated Goal: to go home PT Goal Formulation: With patient/family Time For Goal Achievement: 02/10/16 Potential to Achieve Goals: Fair    Frequency Min 2X/week   Barriers to discharge        Co-evaluation               End of Session Equipment Utilized During Treatment: Gait belt Activity Tolerance: Patient tolerated treatment well;Patient limited by fatigue Patient left: in bed;with call bell/phone within reach;with family/visitor present Nurse Communication: Mobility status    Functional Assessment Tool Used: Clinical Judgement Functional Limitation: Mobility: Walking and moving around Mobility: Walking and Moving Around Current Status [id number]): At least 40 percent but less than 60 percent impaired, limited or restricted Mobility: Walking and Moving Around Goal Status [phone]): At least 20 percent but less than 40 percent impaired, limited or restricted    Time: 1130-1200 PT Time  Calculation (min) (ACUTE ONLY): 30 min   Charges:   PT Evaluation $PT Eval Moderate Complexity: 1 Procedure PT Treatments $Gait Training: 8-22 mins $Therapeutic Activity: 8-22 mins   PT G Codes:   PT G-Codes **NOT FOR INPATIENT CLASS** Functional Assessment Tool Used: Clinical Judgement Functional Limitation: Mobility: Walking and moving around Mobility: Walking and Moving Around Current Status [id number]): At least 40 percent but less than 60 percent impaired, limited or restricted Mobility: Walking and Moving Around Goal Status [phone]): At least 20 percent but less than 40 percent impaired, limited or restricted   Collie Siad PT, DPT 02/03/2016, 12:59 PM

## 2016-02-03 NOTE — Discharge Summary (Signed)
[facility] NAME: Sandy [name]    MR#:  [id number]  DATE OF BIRTH:  Mar 24, 1946  DATE OF ADMISSION:  02/01/2016   ADMITTING PHYSICIAN: Lytle Butte, MD  DATE OF DISCHARGE: 02/03/2016 PRIMARY CARE PHYSICIAN: Harriett Neita Carp, MD   ADMISSION DIAGNOSIS:  Dehydration [E86.0] Confusion [R41.0] Weakness [R53.1] Hypoglycemia [E16.2] DISCHARGE DIAGNOSIS:  Active Problems:   Acute encephalopathy hypoglycemia. SECONDARY DIAGNOSIS:   Past Medical History:  Diagnosis Date  . Cancer ([place])    liver mets; colon cancer  . Constipation   . Diabetes mellitus without complication ([place])   . HOH (hard of hearing)   . Hypertension   . Insomnia   . 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 Acute encephalopathy: possible due to hypoglycemia. Improved to her baseline today, minimize sedating agents  2. Type 2 diabetes with hypoglycemia,  received 1 amp D50 which has improved, hold oral agents continue sliding scale. Resume metformin after discharge. 3. Stage IV colon cancer, progressive per CT chest and abdomen, f/u oncology as outpatient. 4. Essential hypertension continue home medications  Lactic acidosis. Improved. Leukocytosis. Unclear etiology, no source of infection. Improved. She was treated with zosyn.  DISCHARGE CONDITIONS:  Stable, discharge to home with HHPT. CONSULTS OBTAINED:  Treatment Team:  Lytle Butte, MD Creola Corn, MD DRUG ALLERGIES:   Allergies  Allergen Reactions  . Tylenol [Acetaminophen] Nausea And Vomiting   DISCHARGE MEDICATIONS:     Medication List    STOP taking these medications   levofloxacin 500 MG tablet Commonly known as:  LEVAQUIN       TAKE these medications   amiodarone 400 MG tablet Commonly known as:  PACERONE Take 0.5 tablets (200 mg total) by mouth 2 (two) times daily. What changed:  when to take this  Another medication with the same name was removed. Continue taking this medication, and follow the directions you see here.   aspirin EC 81 MG tablet Take 81 mg by mouth daily. Reported on 08/26/2015   atorvastatin 40 MG tablet Commonly known as:  LIPITOR Take 40 mg by mouth daily.   diphenoxylate-atropine 2.5-0.025 MG tablet Commonly known as:  LOMOTIL 1-2 tablets every 6 hours as needed for diarrhea   feeding supplement (ENSURE ENLIVE) Liqd Take 237 mLs by mouth 2 (two) times daily between meals.   gabapentin 100 MG capsule Commonly known as:  NEURONTIN Take 2 capsules (200 mg total) by mouth at bedtime. 1 tab daily and 2 tabs at bedtime. What changed:  additional instructions   lidocaine-prilocaine cream Commonly known as:  EMLA Apply 1 application topically as needed. Apply to port 1 hour prior to chemotherapy appointment. Cover with plastic wrap.   lisinopril 10 MG tablet Commonly known as:  PRINIVIL,ZESTRIL Take 10 mg by mouth daily.   magnesium 30 MG tablet Take 30 mg by mouth daily.   megestrol 400 MG/10ML suspension Commonly known as:  MEGACE Take 5 mLs (200 mg total) by mouth daily. to stimulate appetite   metFORMIN 1000 MG tablet Commonly known as:  GLUCOPHAGE Take 1,000 mg by mouth 2 (two) times daily.   metoprolol tartrate 25 MG tablet Commonly known as:  LOPRESSOR Take 1 tablet (25  mg total) by mouth 2 (two) times daily.   multivitamin with minerals Tabs tablet Take 1 tablet by mouth daily.   ondansetron 4 MG tablet Commonly known as:  ZOFRAN Take 1 tablet (4 mg total) by mouth every 8 (eight) hours as needed for nausea or vomiting.   oxyCODONE 5 MG immediate release tablet Commonly known as:  Oxy IR/ROXICODONE Take 1 tablet (5 mg total) by mouth every 6 (six) hours as  needed for severe pain.   traZODone 50 MG tablet Commonly known as:  DESYREL Take 0.5 tablets (25 mg total) by mouth at bedtime as needed for sleep.        DISCHARGE INSTRUCTIONS:  See AVS.  If you experience worsening of your admission symptoms, develop shortness of breath, life threatening emergency, suicidal or homicidal thoughts you must seek medical attention immediately by calling 911 or calling your MD immediately  if symptoms less severe.  You Must read complete instructions/literature along with all the possible adverse reactions/side effects for all the Medicines you take and that have been prescribed to you. Take any new Medicines after you have completely understood and accpet all the possible adverse reactions/side effects.   Please note  You were cared for by a hospitalist during your hospital stay. If you have any questions about your discharge medications or the care you received while you were in the hospital after you are discharged, you can call the unit and asked to speak with the hospitalist on call if the hospitalist that took care of you is not available. Once you are discharged, your primary care physician will handle any further medical issues. Please note that NO REFILLS for any discharge medications will be authorized once you are discharged, as it is imperative that you return to your primary care physician (or establish a relationship with a primary care physician if you do not have one) for your aftercare needs so that they can reassess your need for medications and monitor your lab values.    On the day of Discharge:  VITAL SIGNS:  Blood pressure 133/66, pulse 75, temperature 98.4 F (36.9 C), temperature source Oral, resp. rate 18, height 5\' 7"  (1.702 m), weight 148 lb 2.4 oz (67.2 kg), SpO2 100 %. PHYSICAL EXAMINATION:  GENERAL:  70 y.o.-year-old patient lying in the bed with no acute distress.  EYES: Pupils equal, round, reactive to light and  accommodation. No scleral icterus. Extraocular muscles intact.  HEENT: Head atraumatic, normocephalic. Oropharynx and nasopharynx clear.  NECK:  Supple, no jugular venous distention. No thyroid enlargement, no tenderness.  LUNGS: Normal breath sounds bilaterally, no wheezing, rales,rhonchi or crepitation. No use of accessory muscles of respiration.  CARDIOVASCULAR: S1, S2 normal. No murmurs, rubs, or gallops.  ABDOMEN: Soft, non-tender, non-distended. Bowel sounds present. No organomegaly or mass.  EXTREMITIES: No pedal edema, cyanosis, or clubbing.  NEUROLOGIC: Cranial nerves II through XII are intact. Muscle strength 3/5 in all extremities. Sensation intact. Gait not checked.  PSYCHIATRIC: The patient is alert and oriented x 2.  SKIN: No obvious rash, lesion, or ulcer.  DATA REVIEW:   CBC  Recent Labs Lab 02/03/16 0446  WBC 9.3  HGB 8.5*  HCT 25.0*  PLT 227    Chemistries   Recent Labs Lab 02/01/16 1616  NA 140  K 4.8  CL 111  CO2 21*  GLUCOSE 70  BUN 12  CREATININE 0.89  CALCIUM 8.6*  AST 33  ALT 12*  ALKPHOS 114  BILITOT 0.7  Microbiology Results  Results for orders placed or performed during the hospital encounter of 02/01/16  Urine culture     Status: None   Collection Time: 02/01/16  4:13 PM  Result Value Ref Range Status   Specimen Description URINE, RANDOM  Final   Special Requests NONE  Final   Culture NO GROWTH Performed at [facility]   Final   Report Status 02/03/2016 FINAL  Final  Culture, blood (routine x 2)     Status: None (Preliminary result)   Collection Time: 02/01/16  4:16 PM  Result Value Ref Range Status   Specimen Description BLOOD LEFT ANTECUBITAL  Final   Special Requests   Final    BOTTLES DRAWN AEROBIC AND ANAEROBIC  AER 12CC ANA 1CC   Culture NO GROWTH 2 DAYS  Final   Report Status PENDING  Incomplete  Culture, blood (routine x 2)     Status: None (Preliminary result)   Collection Time: 02/01/16  4:16 PM  Result  Value Ref Range Status   Specimen Description BLOOD RIGHT ANTECUBITAL  Final   Special Requests BOTTLES DRAWN AEROBIC AND ANAEROBIC  [place]  Final   Culture NO GROWTH 2 DAYS  Final   Report Status PENDING  Incomplete    RADIOLOGY:  Ct Chest W Contrast  Result Date: 02/03/2016 CLINICAL DATA:  70 year old female with history of stage IV colon cancer with known liver metastasis, presenting with altered mental status. Lethargy. Decreased oral intake. EXAM: CT CHEST, ABDOMEN, AND PELVIS WITH CONTRAST TECHNIQUE: Multidetector CT imaging of the chest, abdomen and pelvis was performed following the standard protocol during bolus administration of intravenous contrast. CONTRAST:  73mL ISOVUE-300 IOPAMIDOL (ISOVUE-300) INJECTION 61%, 132mL ISOVUE-300 IOPAMIDOL (ISOVUE-300) INJECTION 61% COMPARISON:  CT the abdomen and pelvis 11/05/2015. Chest CT 10/07/2015. PET-CT 08/21/2015. FINDINGS: CT CHEST FINDINGS Cardiovascular: Heart size is mildly enlarged. There is no significant pericardial fluid, thickening or pericardial calcification. Left-sided subclavian single-lumen porta cath with tip terminating in the right atrium. There is aortic atherosclerosis, as well as atherosclerosis of the great vessels of the mediastinum and the coronary arteries, including calcified atherosclerotic plaque in the left anterior descending coronary artery. Mediastinum/Nodes: No pathologically enlarged mediastinal or hilar lymph nodes. Esophagus is unremarkable in appearance. No axillary lymphadenopathy. Heterogeneously enhancing mass-like enlargement of the right lobe of the thyroid gland which measures up to 3.2 x 3.1 cm (image 2 of series 2), previously hypermetabolic on PET-CT 0000000, and previously evaluated with ultrasound-guided fine-needle aspiration on 08/30/2015. Lungs/Pleura: No definite suspicious appearing pulmonary nodules or masses. No acute consolidative airspace disease. Trace bilateral pleural effusions with this small  amounts of dependent subsegmental atelectasis in the lower lobes of the lungs bilaterally. Musculoskeletal: There are no aggressive appearing lytic or blastic lesions noted in the visualized portions of the skeleton. CT ABDOMEN PELVIS FINDINGS Hepatobiliary: Multiple ill-defined predominantly hypovascular (many of which are partially calcified) lesions appear generally increased in size compared to the prior examination. The largest of these lesions is centered in the right lobe of the liver measuring 8.4 x 6.8 cm (image 49 of series 2), previously 6.4 x 4.2 cm on prior study 11/05/2015. Other large lesions which have clearly grown include a 2.1 x 3.0 cm lesion in segment 4B (image 58 of series 2) which was previously almost imperceptible, and an enlarging 2.9 x 3.7 cm lesion in segment 3 (image 54 of series 2) which has clearly increased compared to the prior study. There are some potential new lesions as well, best  demonstrated by a 2.2 x 1.5 cm lesion in segment 6 (image 65 of series 2) which is not clearly identified on the prior study. No intra or extrahepatic biliary ductal dilatation. Gallbladder is normal in appearance. Pancreas: No pancreatic mass. No pancreatic ductal dilatation. No pancreatic or peripancreatic fluid or inflammatory changes. Spleen: Unremarkable. Adrenals/Urinary Tract: 3.9 cm simple cyst in the lower pole of the right kidney. Left kidney is normal in appearance. Mild nodular thickening of the medial limb of the right ankle adrenal gland measuring 2.0 x 1.1 cm, similar to the prior examination. Left adrenal gland is normal in appearance. No hydroureteronephrosis. Urinary bladder is unremarkable in appearance. Stomach/Bowel: Normal appearance of the stomach. No pathologic dilatation of small bowel or colon. Persistent mass-like area at the transition from distal descending colon to proximal sigmoid colon measuring approximately 7.1 x 4.6 x 5.6 cm (axial image 89 of series 2 and coronal  image 55 of series 5), significantly increased in size compared to prior examinations, likely to reflect progression of the primary colonic neoplasm. Normal appendix. Vascular/Lymphatic: Aortic atherosclerosis, without evidence of aneurysm or dissection in the abdominal or pelvic vasculature. Some borderline enlarged celiac axis lymph nodes are noted. Mildly enlarged 11 mm hepatoduodenal ligament lymph node. No other lymphadenopathy noted in the abdomen or pelvis. Reproductive: IUD in place. Uterus is heterogeneous in appearance with multiple enhancing lesions measuring up to 3.2 cm in the anterior aspect of the uterine body, presumably small fibroids. Ovaries are unremarkable in appearance. Other: No significant volume of ascites.  No pneumoperitoneum. Musculoskeletal: There are no aggressive appearing lytic or blastic lesions noted in the visualized portions of the skeleton. IMPRESSION: 1. Today's study demonstrates progression of both the primary lesion at the junction of distal descending colon and proximal sigmoid colon, as well as progression of metastatic disease to the liver as evidenced by increased number and size of numerous hepatic lesions, as discussed above. 2. Mildly enlarged hepatoduodenal ligament lymph node. This is only slightly larger than the prior examination. No other definite signs of extra hepatic metastatic disease noted in the chest, abdomen or pelvis. 3. Trace bilateral pleural effusions lying dependently are new compared to the prior study. These are associated with minimal dependent subsegmental atelectasis. 4. Persistently enlarged and heterogeneously enhancing mass in the right lobe of the thyroid glands, previously hypermetabolic and biopsied in early 2017. Correlation with prior biopsy results is recommended. 5. Fibroid uterus. 6. Aortic atherosclerosis, in addition to left anterior descending coronary artery disease. Assessment for potential risk factor modification, dietary  therapy or pharmacologic therapy may be warranted, if clinically indicated. 7. Mild cardiomegaly. 8. Additional incidental findings, as above. Electronically Signed   By: Vinnie Langton M.D.   On: 02/03/2016 08:02   Ct Abdomen Pelvis W Contrast  Result Date: 02/03/2016 CLINICAL DATA:  70 year old female with history of stage IV colon cancer with known liver metastasis, presenting with altered mental status. Lethargy. Decreased oral intake. EXAM: CT CHEST, ABDOMEN, AND PELVIS WITH CONTRAST TECHNIQUE: Multidetector CT imaging of the chest, abdomen and pelvis was performed following the standard protocol during bolus administration of intravenous contrast. CONTRAST:  53mL ISOVUE-300 IOPAMIDOL (ISOVUE-300) INJECTION 61%, 126mL ISOVUE-300 IOPAMIDOL (ISOVUE-300) INJECTION 61% COMPARISON:  CT the abdomen and pelvis 11/05/2015. Chest CT 10/07/2015. PET-CT 08/21/2015. FINDINGS: CT CHEST FINDINGS Cardiovascular: Heart size is mildly enlarged. There is no significant pericardial fluid, thickening or pericardial calcification. Left-sided subclavian single-lumen porta cath with tip terminating in the right atrium. There is aortic atherosclerosis, as well  as atherosclerosis of the great vessels of the mediastinum and the coronary arteries, including calcified atherosclerotic plaque in the left anterior descending coronary artery. Mediastinum/Nodes: No pathologically enlarged mediastinal or hilar lymph nodes. Esophagus is unremarkable in appearance. No axillary lymphadenopathy. Heterogeneously enhancing mass-like enlargement of the right lobe of the thyroid gland which measures up to 3.2 x 3.1 cm (image 2 of series 2), previously hypermetabolic on PET-CT 0000000, and previously evaluated with ultrasound-guided fine-needle aspiration on 08/30/2015. Lungs/Pleura: No definite suspicious appearing pulmonary nodules or masses. No acute consolidative airspace disease. Trace bilateral pleural effusions with this small amounts  of dependent subsegmental atelectasis in the lower lobes of the lungs bilaterally. Musculoskeletal: There are no aggressive appearing lytic or blastic lesions noted in the visualized portions of the skeleton. CT ABDOMEN PELVIS FINDINGS Hepatobiliary: Multiple ill-defined predominantly hypovascular (many of which are partially calcified) lesions appear generally increased in size compared to the prior examination. The largest of these lesions is centered in the right lobe of the liver measuring 8.4 x 6.8 cm (image 49 of series 2), previously 6.4 x 4.2 cm on prior study 11/05/2015. Other large lesions which have clearly grown include a 2.1 x 3.0 cm lesion in segment 4B (image 58 of series 2) which was previously almost imperceptible, and an enlarging 2.9 x 3.7 cm lesion in segment 3 (image 54 of series 2) which has clearly increased compared to the prior study. There are some potential new lesions as well, best demonstrated by a 2.2 x 1.5 cm lesion in segment 6 (image 65 of series 2) which is not clearly identified on the prior study. No intra or extrahepatic biliary ductal dilatation. Gallbladder is normal in appearance. Pancreas: No pancreatic mass. No pancreatic ductal dilatation. No pancreatic or peripancreatic fluid or inflammatory changes. Spleen: Unremarkable. Adrenals/Urinary Tract: 3.9 cm simple cyst in the lower pole of the right kidney. Left kidney is normal in appearance. Mild nodular thickening of the medial limb of the right ankle adrenal gland measuring 2.0 x 1.1 cm, similar to the prior examination. Left adrenal gland is normal in appearance. No hydroureteronephrosis. Urinary bladder is unremarkable in appearance. Stomach/Bowel: Normal appearance of the stomach. No pathologic dilatation of small bowel or colon. Persistent mass-like area at the transition from distal descending colon to proximal sigmoid colon measuring approximately 7.1 x 4.6 x 5.6 cm (axial image 89 of series 2 and coronal image 55 of  series 5), significantly increased in size compared to prior examinations, likely to reflect progression of the primary colonic neoplasm. Normal appendix. Vascular/Lymphatic: Aortic atherosclerosis, without evidence of aneurysm or dissection in the abdominal or pelvic vasculature. Some borderline enlarged celiac axis lymph nodes are noted. Mildly enlarged 11 mm hepatoduodenal ligament lymph node. No other lymphadenopathy noted in the abdomen or pelvis. Reproductive: IUD in place. Uterus is heterogeneous in appearance with multiple enhancing lesions measuring up to 3.2 cm in the anterior aspect of the uterine body, presumably small fibroids. Ovaries are unremarkable in appearance. Other: No significant volume of ascites.  No pneumoperitoneum. Musculoskeletal: There are no aggressive appearing lytic or blastic lesions noted in the visualized portions of the skeleton. IMPRESSION: 1. Today's study demonstrates progression of both the primary lesion at the junction of distal descending colon and proximal sigmoid colon, as well as progression of metastatic disease to the liver as evidenced by increased number and size of numerous hepatic lesions, as discussed above. 2. Mildly enlarged hepatoduodenal ligament lymph node. This is only slightly larger than the prior examination. No  other definite signs of extra hepatic metastatic disease noted in the chest, abdomen or pelvis. 3. Trace bilateral pleural effusions lying dependently are new compared to the prior study. These are associated with minimal dependent subsegmental atelectasis. 4. Persistently enlarged and heterogeneously enhancing mass in the right lobe of the thyroid glands, previously hypermetabolic and biopsied in early 2017. Correlation with prior biopsy results is recommended. 5. Fibroid uterus. 6. Aortic atherosclerosis, in addition to left anterior descending coronary artery disease. Assessment for potential risk factor modification, dietary therapy or  pharmacologic therapy may be warranted, if clinically indicated. 7. Mild cardiomegaly. 8. Additional incidental findings, as above. Electronically Signed   By: Vinnie Langton M.D.   On: 02/03/2016 08:02     Management plans discussed with the patient, family and they are in agreement.  CODE STATUS:     Code Status Orders        Start     Ordered   02/01/16 1844  Full code  Continuous     02/01/16 1843    Code Status History    Date Active Date Inactive Code Status Order ID Comments User Context   12/31/2015 10:59 PM 01/05/2016  6:05 PM Full Code [id number]  Theodoro Grist, MD Inpatient   11/11/2015  8:03 AM 11/12/2015  8:20 PM DNR [id number]  Hillary Bow, MD Inpatient   11/08/2015  2:54 PM 11/08/2015  5:44 PM DNR [id number]  Awilda Bill, NP Inpatient   11/07/2015  6:17 PM 11/08/2015  2:54 PM Full Code [id number]  Vaughan Basta, MD Inpatient   08/28/2014  4:37 PM 08/29/2014  8:42 PM Full Code [id number]  Theodoro Grist, MD Inpatient    Advance Directive Documentation   Flowsheet Row Most Recent Value  Type of Advance Directive  Healthcare Power of Attorney  Pre-existing out of facility DNR order (yellow form or pink MOST form)  No data  "MOST" Form in Place?  No data      TOTAL TIME TAKING CARE OF THIS PATIENT: 32 minutes.    Demetrios Loll M.D on 02/03/2016 at 2:53 PM  Between 7am to 6pm - Pager - [phone]  After 6pm go to [email or web address] - Proofreader  [facility]  Office  [phone]  CC: Primary care physician; [name], MD   Note: This dictation was prepared with Dragon dictation along with smaller phrase technology. Any transcriptional errors that result from this process are unintentional.

## 2016-02-03 NOTE — Progress Notes (Signed)
Pt is being discharged home with [facility]. Discharge papers given and explained to pt's daughter. Pt's daughter verbalized understanding. F/u appointments and meds reviewed with pt's daughter. Awaiting EMS

## 2016-02-06 LAB — CULTURE, BLOOD (ROUTINE X 2)
CULTURE: NO GROWTH
CULTURE: NO GROWTH

## 2016-02-07 ENCOUNTER — Telehealth: Payer: Self-pay | Admitting: *Deleted

## 2016-02-07 NOTE — Telephone Encounter (Signed)
Sandy [name], patient's daughter called to ask if it is okay that she come alone on Tuesday at patient's appointment time to speak to the MD regarding her plan for the patient.  Spoke with Dr. Mike Gip and she wants to see the patient.  Patient recently in hospital and MD was unable to see her due to clinic hours and when she went after clinic the patient had been discharged.  MD wants to assess patient and based on her assessment and recent findings will decide on her plan.  Called [name] back and explained this to her.  She verbalized understanding and agreed to bring patient at scheduled appointment.

## 2016-02-11 ENCOUNTER — Inpatient Hospital Stay: Payer: Medicare Other

## 2016-02-11 ENCOUNTER — Inpatient Hospital Stay (HOSPITAL_BASED_OUTPATIENT_CLINIC_OR_DEPARTMENT_OTHER): Payer: Medicare Other | Admitting: Hematology and Oncology

## 2016-02-11 ENCOUNTER — Encounter: Payer: Self-pay | Admitting: Hematology and Oncology

## 2016-02-11 VITALS — BP 128/87 | HR 93 | Resp 18 | Wt 151.1 lb

## 2016-02-11 DIAGNOSIS — C187 Malignant neoplasm of sigmoid colon: Secondary | ICD-10-CM

## 2016-02-11 DIAGNOSIS — Z87891 Personal history of nicotine dependence: Secondary | ICD-10-CM

## 2016-02-11 DIAGNOSIS — Z79899 Other long term (current) drug therapy: Secondary | ICD-10-CM | POA: Diagnosis not present

## 2016-02-11 DIAGNOSIS — I10 Essential (primary) hypertension: Secondary | ICD-10-CM | POA: Diagnosis not present

## 2016-02-11 DIAGNOSIS — C189 Malignant neoplasm of colon, unspecified: Secondary | ICD-10-CM

## 2016-02-11 DIAGNOSIS — C787 Secondary malignant neoplasm of liver and intrahepatic bile duct: Secondary | ICD-10-CM | POA: Diagnosis not present

## 2016-02-11 DIAGNOSIS — D709 Neutropenia, unspecified: Secondary | ICD-10-CM

## 2016-02-11 DIAGNOSIS — D5780 Other sickle-cell disorders without crisis: Secondary | ICD-10-CM | POA: Diagnosis not present

## 2016-02-11 DIAGNOSIS — E119 Type 2 diabetes mellitus without complications: Secondary | ICD-10-CM

## 2016-02-11 DIAGNOSIS — Z9221 Personal history of antineoplastic chemotherapy: Secondary | ICD-10-CM | POA: Diagnosis not present

## 2016-02-11 DIAGNOSIS — E041 Nontoxic single thyroid nodule: Secondary | ICD-10-CM

## 2016-02-11 DIAGNOSIS — Z809 Family history of malignant neoplasm, unspecified: Secondary | ICD-10-CM

## 2016-02-11 DIAGNOSIS — I4891 Unspecified atrial fibrillation: Secondary | ICD-10-CM

## 2016-02-11 DIAGNOSIS — R109 Unspecified abdominal pain: Secondary | ICD-10-CM

## 2016-02-11 DIAGNOSIS — G4700 Insomnia, unspecified: Secondary | ICD-10-CM

## 2016-02-11 DIAGNOSIS — Z923 Personal history of irradiation: Secondary | ICD-10-CM

## 2016-02-11 DIAGNOSIS — G629 Polyneuropathy, unspecified: Secondary | ICD-10-CM

## 2016-02-11 DIAGNOSIS — K5900 Constipation, unspecified: Secondary | ICD-10-CM | POA: Diagnosis not present

## 2016-02-11 DIAGNOSIS — Z7984 Long term (current) use of oral hypoglycemic drugs: Secondary | ICD-10-CM

## 2016-02-11 LAB — MAGNESIUM: Magnesium: 1.7 mg/dL (ref 1.7–2.4)

## 2016-02-11 LAB — CBC WITH DIFFERENTIAL/PLATELET
Basophils Absolute: 0.1 10*3/uL (ref 0–0.1)
Basophils Relative: 1 %
Eosinophils Absolute: 0.2 10*3/uL (ref 0–0.7)
Eosinophils Relative: 2 %
HCT: 30.4 % — ABNORMAL LOW (ref 35.0–47.0)
Hemoglobin: 9.9 g/dL — ABNORMAL LOW (ref 12.0–16.0)
Lymphocytes Relative: 11 %
Lymphs Abs: 1.5 10*3/uL (ref 1.0–3.6)
MCH: 29.5 pg (ref 26.0–34.0)
MCHC: 32.7 g/dL (ref 32.0–36.0)
MCV: 90.3 fL (ref 80.0–100.0)
Monocytes Absolute: 0.7 10*3/uL (ref 0.2–0.9)
Monocytes Relative: 5 %
Neutro Abs: 11.3 10*3/uL — ABNORMAL HIGH (ref 1.4–6.5)
Neutrophils Relative %: 81 %
Platelets: 545 10*3/uL — ABNORMAL HIGH (ref 150–440)
RBC: 3.37 MIL/uL — ABNORMAL LOW (ref 3.80–5.20)
RDW: 16.3 % — ABNORMAL HIGH (ref 11.5–14.5)
WBC: 13.7 10*3/uL — ABNORMAL HIGH (ref 3.6–11.0)

## 2016-02-11 LAB — COMPREHENSIVE METABOLIC PANEL
ALT: 13 U/L — ABNORMAL LOW (ref 14–54)
AST: 24 U/L (ref 15–41)
Albumin: 2.1 g/dL — ABNORMAL LOW (ref 3.5–5.0)
Alkaline Phosphatase: 226 U/L — ABNORMAL HIGH (ref 38–126)
Anion gap: 9 (ref 5–15)
BUN: 12 mg/dL (ref 6–20)
CO2: 21 mmol/L — ABNORMAL LOW (ref 22–32)
Calcium: 8.5 mg/dL — ABNORMAL LOW (ref 8.9–10.3)
Chloride: 110 mmol/L (ref 101–111)
Creatinine, Ser: 1.06 mg/dL — ABNORMAL HIGH (ref 0.44–1.00)
GFR calc Af Amer: 59 mL/min — ABNORMAL LOW (ref >60)
GFR calc non Af Amer: 51 mL/min — ABNORMAL LOW (ref >60)
Glucose, Bld: 123 mg/dL — ABNORMAL HIGH (ref 65–99)
Potassium: 3.9 mmol/L (ref 3.5–5.1)
Sodium: 140 mmol/L (ref 135–145)
Total Bilirubin: 0.3 mg/dL (ref 0.3–1.2)
Total Protein: 5.8 g/dL — ABNORMAL LOW (ref 6.5–8.1)

## 2016-02-11 MED ORDER — HEPARIN SOD (PORK) LOCK FLUSH 100 UNIT/ML IV SOLN
500.0000 [IU] | Freq: Once | INTRAVENOUS | Status: AC
  Administered 2016-02-11: 500 [IU] via INTRAVENOUS
  Filled 2016-02-11: qty 5

## 2016-02-11 MED ORDER — SODIUM CHLORIDE 0.9% FLUSH
10.0000 mL | INTRAVENOUS | Status: DC | PRN
Start: 2016-02-11 — End: 2016-02-11
  Administered 2016-02-11: 10 mL via INTRAVENOUS
  Filled 2016-02-11: qty 10

## 2016-02-11 NOTE — Progress Notes (Signed)
[facility] day:  02/11/2016   Chief Complaint: PHYLLICIA DUDEK is a 70 y.o. female with stage IV colon cancer who is seen for assessment following interval hospitalization.  HPI: The patient was last seen in the medical oncology clinic on 01/27/2016.  At that time, she had a stomach ache after eating ice cream (lactose intolerant).  She was concerned about getting admitted to the hospital.  Exam is unremarkable.  We discussed UGT1A1 testing and plan to decrease irinotecan secondary to toxicity.  She wished to defer treatment secondary to her stomach ache and ongoing cardiac evaluation.  CEA returned after clinic and was 1268.0 (641 on 12/23/2015).  She was admitted to [facility] from 02/01/2016 - 02/03/2016 with acute encephalopathy possibly due to hypoglycemia.  Notes indicate that she improved to her baseline.  Sedating agents were minimized.  She had leukocytosis of unclear etiology.  No source of infection was identified.  She was treated with Zosyn.  Chest, abdomen and pelvic CT scan on 02/02/2016 revealed progression of both the primary lesion at the junction of distal descending colon and proximal sigmoid colon, as well as progression of metastatic disease to the liver as evidenced by increased number and size of numerous hepatic lesions.  There was mildly enlarged hepatoduodenal ligament lymph node. There were no other definite signs of extra hepatic metastatic disease noted in the chest, abdomen or pelvis.  There was trace bilateral pleural effusions lying dependently (new).  There was persistently enlarged and heterogeneously enhancing mass in the right lobe of the thyroid glands.  Symptomatically, she feels good.  Her daughter states that her "mind when out" 02/06/2016.  She was just laying around and didn't want to be bothered. She apparently "snapped out of it after the nurse came by talked about hospice".  She is eating. Her sugar has been low.   Her daughter notes a concern when her mother is not interested in bacon.  She has been up at home. She is folding sheets and towels.  She helps with toileting and showering. She rests less than 50% of the day   Past Medical History:  Diagnosis Date  . Cancer ([place])    liver mets; colon cancer  . Constipation   . Diabetes mellitus without complication ([place])   . HOH (hard of hearing)   . Hypertension   . Insomnia   . 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 Wears dentures    partial upper and lower    Past Surgical History:  Procedure Laterality Date  . CARDIAC CATHETERIZATION    . COLONOSCOPY WITH PROPOFOL N/A 09/07/2014   Procedure: COLONOSCOPY WITH PROPOFOL;  Surgeon: Lucilla Lame, MD;  Location: [facility] ENDOSCOPY;  Service: Endoscopy;  Laterality: N/A;  . ESOPHAGOGASTRODUODENOSCOPY N/A 09/07/2014   Procedure: ESOPHAGOGASTRODUODENOSCOPY (EGD);  Surgeon: Lucilla Lame, MD;  Location: [facility] ENDOSCOPY;  Service: Endoscopy;  Laterality: N/A;  . FLEXIBLE SIGMOIDOSCOPY N/A 09/02/2015   Procedure: FLEXIBLE SIGMOIDOSCOPY;  Surgeon: Lucilla Lame, MD;  Location: [place];  Service: Endoscopy;  Laterality: N/A;  Diabetic - oral meds Pt has port-a-cath  . LIVER BIOPSY    . PORTACATH PLACEMENT Left 09/24/2014   Procedure: INSERTION PORT-A-CATH;  Surgeon: Robert Bellow, MD;  Location: [facility] ORS;  Service: General;  Laterality: Left;    Family History  Problem Relation Age of Onset  . Cancer Sister     Social History:  reports that she quit smoking  about 2 years ago. Her smoking use included Cigarettes. She has a 5.00 pack-year smoking history. She has never used smokeless tobacco. She reports that she does not drink alcohol or use drugs.   The patient's medical power of attorney is her daughter.  The patient is accompanied by her daughter, [name].    Allergies:  Allergies  Allergen Reactions  . Tylenol [Acetaminophen] Nausea And Vomiting    Current  Medications: Current Outpatient Prescriptions  Medication Sig Dispense Refill  . amiodarone (PACERONE) 400 MG tablet Take 0.5 tablets (200 mg total) by mouth 2 (two) times daily. (Patient taking differently: Take 200 mg by mouth daily. ) 30 tablet 0  . aspirin EC 81 MG tablet Take 81 mg by mouth daily. Reported on 08/26/2015    . atorvastatin (LIPITOR) 40 MG tablet Take 40 mg by mouth daily.    . diphenoxylate-atropine (LOMOTIL) 2.5-0.025 MG tablet 1-2 tablets every 6 hours as needed for diarrhea 40 tablet 1  . feeding supplement, ENSURE ENLIVE, (ENSURE ENLIVE) LIQD Take 237 mLs by mouth 2 (two) times daily between meals. 237 mL 12  . gabapentin (NEURONTIN) 100 MG capsule Take 2 capsules (200 mg total) by mouth at bedtime. 1 tab daily and 2 tabs at bedtime. (Patient taking differently: Take 200 mg by mouth at bedtime. 1 tab in the morning daily and 2 capsules at bedtime.) 60 capsule 0  . lidocaine-prilocaine (EMLA) cream Apply 1 application topically as needed. Apply to port 1 hour prior to chemotherapy appointment. Cover with plastic wrap. 30 g 1  . lisinopril (PRINIVIL,ZESTRIL) 10 MG tablet Take 10 mg by mouth daily.    . magnesium 30 MG tablet Take 30 mg by mouth daily.     . megestrol (MEGACE) 400 MG/10ML suspension Take 5 mLs (200 mg total) by mouth daily. to stimulate appetite 240 mL 0  . metFORMIN (GLUCOPHAGE) 1000 MG tablet Take 1,000 mg by mouth 2 (two) times daily.     . Multiple Vitamin (MULTIVITAMIN WITH MINERALS) TABS tablet Take 1 tablet by mouth daily.    . ondansetron (ZOFRAN) 4 MG tablet Take 1 tablet (4 mg total) by mouth every 8 (eight) hours as needed for nausea or vomiting. 90 tablet 2  . oxyCODONE (OXY IR/ROXICODONE) 5 MG immediate release tablet Take 1 tablet (5 mg total) by mouth every 6 (six) hours as needed for severe pain. 30 tablet 0  . traZODone (DESYREL) 50 MG tablet Take 0.5 tablets (25 mg total) by mouth at bedtime as needed for sleep. 20 tablet 0  . metoprolol  tartrate (LOPRESSOR) 25 MG tablet Take 1 tablet (25 mg total) by mouth 2 (two) times daily. (Patient not taking: Reported on 02/11/2016) 60 tablet 0   No current facility-administered medications for this visit.    Facility-Administered Medications Ordered in Other Visits  Medication Dose Route Frequency Provider Last Rate Last Dose  . acetaminophen (TYLENOL) tablet 650 mg  650 mg Oral Once Lequita Asal, MD      . heparin lock flush 100 unit/mL  500 Units Intravenous Once Lequita Asal, MD      . sodium chloride 0.9 % injection 10 mL  10 mL Intracatheter PRN Lequita Asal, MD   10 mL at 10/11/14 1431  . sodium chloride 0.9 % injection 10 mL  10 mL Intracatheter PRN Lequita Asal, MD   10 mL at 02/28/15 1529  . sodium chloride flush (NS) 0.9 % injection 10 mL  10 mL Intravenous PRN  Lequita Asal, MD   10 mL at 02/11/16 0915    Review of Systems:  GENERAL: Feels good.  No fevers.  Weight up 9 pounds. PERFORMANCE STATUS (ECOG): 2. HEENT: Wears a hearing aide. No visual changes, runny nose, sore throat, mouth sores or tenderness. Lungs:  No shortness of breath or cough. No hemoptysis. Cardiac: No chest pain, palpitations, orthopnea, or PND. Off metoprolol.  24 hour Holter pending.  Echo on 02/10/2016. GI: Stomach cramps secondary to lactose intolerance and recent ice cream.  No nausea, vomiting, diarrhea, melena or hematochezia. GU: No dysuria or hematuria. Musculoskeletal: No back pain. No joint pain. No muscle tenderness. Extremities: No pain or swelling. Skin: No rashes, skin lesions, or ulcers Neuro: No headache, numbness or weakness, or balance issues. Endocrine: Diabetes. No thyroid issues, hot flashes or night sweats. Psych: No mood changes or anxiety. Pain: Takes oxycodone in the AM. Review of systems: All other systems   Physical Exam: Blood pressure 128/87, pulse 93, resp. rate 18, weight 151 lb 1 oz (68.5 kg). GENERAL: Elderly pale  appearing woman sitting comfortably in a wheelchair in no acute distress. MENTAL STATUS: Alert and oriented to person, place and time. HEAD: Wearing a black wrap and scarf.  Alopecia totalis. Normocephalic, atraumatic, face symmetric, no Cushingoid features. EYES: Black rimmed glasses. Hazel/green eyes. Pupils equal round and reactive to light and accomodation. No conjunctivitis or scleral icterus. ENT: Oropharynx clear without lesion. Missing several teeth. Tongue normal. Mucous membranes moist.  RESPIRATORY: Clear to auscultation without rales, wheezes or rhonchi. CARDIOVASCULAR: Regular rate and rhythm without murmur, rub or gallop. ABDOMEN: Soft, non-tender with active bowel sounds. No guarding or rebound tenderness. No hepatosplenomegaly. SKIN: No rashes, ulcer or skin lesions.  EXTREMITIES: No edema, no skin discoloration or tenderness. No palpable cords. LYMPH NODES:  Right low anterior cervical 3 cm soft mass (stable). No palpable supraclavicular, axillary or inguinal adenopathy  NEUROLOGICAL: Appropriate. PSYCH: Affect normal.   Infusion on 02/11/2016  Component Date Value Ref Range Status  . WBC 02/11/2016 13.7* 3.6 - 11.0 K/uL Final  . RBC 02/11/2016 3.37* 3.80 - 5.20 MIL/uL Final  . Hemoglobin 02/11/2016 9.9* 12.0 - 16.0 g/dL Final  . HCT 02/11/2016 30.4* 35.0 - 47.0 % Final  . MCV 02/11/2016 90.3  80.0 - 100.0 fL Final  . MCH 02/11/2016 29.5  26.0 - 34.0 pg Final  . MCHC 02/11/2016 32.7  32.0 - 36.0 g/dL Final  . RDW 02/11/2016 16.3* 11.5 - 14.5 % Final  . Platelets 02/11/2016 545* 150 - 440 K/uL Final  . Neutrophils Relative % 02/11/2016 81  % Final  . Neutro Abs 02/11/2016 11.3* 1.4 - 6.5 K/uL Final  . Lymphocytes Relative 02/11/2016 11  % Final  . Lymphs Abs 02/11/2016 1.5  1.0 - 3.6 K/uL Final  . Monocytes Relative 02/11/2016 5  % Final  . Monocytes Absolute 02/11/2016 0.7  0.2 - 0.9 K/uL Final  . Eosinophils Relative 02/11/2016 2  % Final  .  Eosinophils Absolute 02/11/2016 0.2  0 - 0.7 K/uL Final  . Basophils Relative 02/11/2016 1  % Final  . Basophils Absolute 02/11/2016 0.1  0 - 0.1 K/uL Final  . Sodium 02/11/2016 140  135 - 145 mmol/L Final  . Potassium 02/11/2016 3.9  3.5 - 5.1 mmol/L Final  . Chloride 02/11/2016 110  101 - 111 mmol/L Final  . CO2 02/11/2016 21* 22 - 32 mmol/L Final  . Glucose, Bld 02/11/2016 123* 65 - 99 mg/dL Final  .  BUN 02/11/2016 12  6 - 20 mg/dL Final  . Creatinine, Ser 02/11/2016 1.06* 0.44 - 1.00 mg/dL Final  . Calcium 02/11/2016 8.5* 8.9 - 10.3 mg/dL Final  . Total Protein 02/11/2016 5.8* 6.5 - 8.1 g/dL Final  . Albumin 02/11/2016 2.1* 3.5 - 5.0 g/dL Final  . AST 02/11/2016 24  15 - 41 U/L Final  . ALT 02/11/2016 13* 14 - 54 U/L Final  . Alkaline Phosphatase 02/11/2016 226* 38 - 126 U/L Final  . Total Bilirubin 02/11/2016 0.3  0.3 - 1.2 mg/dL Final  . GFR calc non Af Amer 02/11/2016 51* >60 mL/min Final  . GFR calc Af Amer 02/11/2016 59* >60 mL/min Final   Comment: (NOTE) The eGFR has been calculated using the CKD EPI equation. This calculation has not been validated in all clinical situations. eGFR's persistently <60 mL/min signify possible Chronic Kidney Disease.   . Anion gap 02/11/2016 9  5 - 15 Final  . Magnesium 02/11/2016 1.7  1.7 - 2.4 mg/dL Final    Assessment:  TYRINA HINES is a 70 y.o. female with metastatic colon cancer. She presented with a 53 pound weight loss in 6 months. Colonoscopy on 09/07/2014 revealed a circumferential partially obstructive fungating mass in the descending colon. There was no active bleeding. There was a 1.5 cm pedunculated polyp in the sigmoid colon (tubulovillous adenoma). Pathology from the descending colon mass revealed fragments of adenocarcinoma. There was not enough tissue for KRAS testing.  CEA was 11,793 on 09/05/2014.  KRAS and NRAS mutational analysis on 09/02/2015 was incomplete.  KRAS results from analysis of codons 59, 61, and 117  were negative.  Results were not obtained for codons 12, 13, and 146.  NRAS gene results from analysis of codons 12 and 13 were negative.  Results were not obtained for codons 59, 69, 61, 117, and 146.  Chest CT angiogram on 08/28/2014 revealed no evidence of pulmonary embolism, but multiple large enhancing liver masses (largest 6.4 cm).  PET scan on 10/04/2014 revealed 6.5 x 6.1 cm hypermetabolic mass in the proximal sigmoid colon,  There were  numerous large hypermetabolic hepatic metastases. There were no additional sites of metastatic disease in the neck, chest or skeleton.  In addition, there was a 3.2 x 2.8 cm low-attenuation right thyroid nodule.   She is status post 14 cycles of FOLFOX chemotherapy (10/09/2014 - 04/11/2015).  She declined Avastin.  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    CEA was 10,169 on 10/09/2014 , 12,034 on 0712/2016, 7765 on 11/20/2014, 5154 on 12/04/2014, 1921 on 01/01/2015, 1266 on 01/15/2015, 770.6 on 01/29/2015, 502.7 on 02/12/2015, 368.7 on 02/26/2015, 269 on 03/14/2015, 178.3 on 03/28/2015, 135.9 on 04/11/2015, 116.1 on 05/13/2015, 90.8 on 06/10/2015, 145.9 on 07/08/2015, 300.7 on 08/05/2015, 296.3 on 08/19/2015, and 443.0 on 09/24/2015, 1341 on 10/08/2015, 523 on 11/12/2015, 532.5 on 12/09/2015, 641.3 on 12/23/2015, 1268.0 on 01/27/2016, and 1708 on 02/11/2016.  Abdominal and pelvic CT scan on 12/28/2014 revealed response to therapy.  Liver metastases and the descending/sigmoid mass were smaller.  There was no new disease.  Abdomen and pelvic CT scan on 03/25/2015 revealed interval decrease in size of hepatic metastasis and distal descending colon mass.   Abdominal and pelvic CT scan on 03/25/2015 revealed decreasing hepatic metastasis and distal descending colon mass.  Right hepatic lobe lesion was 2.2 x 2.1 cm.  Left lateral hepatic lobe lesion was 1.9 x 1.5 cm.  There was stable thickening of the adrenal glands.  She received 8 cycles of  5-fluorouracil and leucovorin (04/28/2014 - 09/24/2015).  Cycle #4 was held on 06/10/2015 secondary to rectal bleeding.  Abdominal and pelvic MRI on 08/01/2015 revealed multiple hypoenhancing hepatic metastasis in both lobes of the liver (3.3 x 2.2 cm in segment VII, 5.9 x 4.1 cm in segment VIII, and a 2.4 x 1.7 cm caudate lesion).  There was a 4.3 cm length of focally narrowed sigmoid colon.  There was nodular thickening of the right adrenal gland.  PET scan on 08/21/2015 revealed interval improvement in the multifocal hepatic metastatic disease. There was residual hypermetabolic tumor.  The primary colonic lesion at the junction of the descending and sigmoid colon had decreased in size and metabolic activity.  There was persistent hypermetabolic activity in the lower right neck, possibly associated with the thyroid gland.   Abdominal and pelvic CT scan on 10/04/2015 revealed increased bulkiness of soft tissue mass in distal descending colon compared with previous studies, consistent with primary colon carcinoma.  The mass measured 4.7 cm compared to 3.3 cm.  The liver metastasis were felt larger by second radiology review.  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  Afirma gene expression classifier GEC) was suspicious.  Gene expression signature for medullary thyroid cancer (MTC) and BRAF V600E were negative.  Patient wishes to defer any treatment.  She is s/p 3 cycles of FOLFIRI chemotherapy (10/08/2015 - 10/29/2015; 12/23/2015).  Cycle #1 complicated by an admission for fever and neutropenia on day 11 (10/18/2015).  All cultures were negative.  Cycle #2 was complicated by fever and neutropenia (ANC 0) despite Neulasta.  She was diagnosed with Klebsiella pneumonia bacteremia.  Cycle #3 was complicated by fever and neutropenia (ANC 100) despite Neulasta and dose reduction.  UGT1A1 testing revealed one copy of the  UGT1A1*28 allele associated with irinotecan toxicity (reduced irinotecan glucuronidation).  One copy of the UGT1A1*37 was also present.  It is associated with reduced UGT1A1 expression, but no direct association with irinotecan toxicity.  She was admitted to [facility] from 12/31/2015 - 01/05/2016 with fever and neutropenia.  She was treated with Cefepime.  All cultures remained negative.  While hospitalized she developed atrial fibrillation.  Her amiodarone was increased.  Nadir ANC was 100 on 12/31/2015.  She was admitted to [facility] from 02/01/2016 - 02/03/2016 with acute encephalopathy possibly due to hypoglycemia.  Sedating agents were minimized.  She had leukocytosis of unclear etiology.  No source of infection was identified.  She was treated with Zosyn.  Chest, abdomen and pelvic CT scan on 02/02/2016 revealed progression of both the primary lesion at the junction of distal descending colon and proximal sigmoid colon, as well as progression of metastatic disease to the liver as evidenced by increased number and size of numerous hepatic lesions.  There was persistently enlarged and heterogeneously enhancing mass in the right lobe of the thyroid glands.  She has history of atrial fibrillation with RVR.  She is on amiodarone.  She is off metoprolol.  She had a 24 hour Holter.  Echo is pending.  Symptomatically, she is feeling better.  Exam is stable.  Plan: 1.  Labs today:  CBC with diff, CMP, Mg, CEA. 2.  Review events of recent hospitalization.  Discuss scans and CEA document progressive disease. 3.  Discuss option of oral chemotherapy (Stivarga).  Side effects reviewed. 4.  No chemotherapy today.  Patient considering oral chemotherapy. 5.  RTC for MD assessment,  labs (CBC with diff, CMP, CEA, Mg), and initiation of third line therapy.   Lequita Asal, MD  02/11/2016, 9:53 AM

## 2016-02-11 NOTE — Progress Notes (Signed)
Patient accompanied by daughter today who states patient is having weakness in her legs.  She is wearing knee braces to help her when she has to walk.  Patient is eating and sleeping okay. Daughter states since last Friday patient has come from 0-100 (doing much better).

## 2016-02-11 NOTE — Patient Instructions (Signed)
Regorafenib tablets What is this medicine? REGORAFENIB (RE goe RAF e nib) is a medicine that targets proteins in cancer cells and stops the cancer cells from growing. It is used to treat colorectal cancer and gastrointestinal stromal tumors (GIST). This medicine may be used for other purposes; ask your health care provider or pharmacist if you have questions. What should I tell my health care provider before I take this medicine? They need to know if you have any of these conditions: -bleeding disorders -heart disease -high blood pressure -liver disease -recent surgery -an unusual or allergic reaction to regorafenib, other medicines, foods, dyes, or preservatives -pregnant or trying to get pregnant -breast-feeding How should I use this medicine? Take this medicine by mouth with a glass of water. Follow the directions on the prescription label. Do not take it more often than directed. Take this medicine with food. Do not take with grapefruit juice. Do not stop taking except on your doctor's advice. Talk to your pediatrician regarding the use of this medicine in children. Special care may be needed. Overdosage: If you think you have taken too much of this medicine contact a poison control center or emergency room at once. NOTE: This medicine is only for you. Do not share this medicine with others. What if I miss a dose? If you miss a dose, take it as soon as you can. If it is almost time for your next dose, take only that dose. Do not take double or extra doses. What may interact with this medicine? This medicine may interact with the following: -carbamazepine -irinotecan -itraconazole -ketoconazole -phenobarbital -phenytoin -posaconazole -rifampin -St. John's Wort -telithromycin -voriconazole -warfarin This list may not describe all possible interactions. Give your health care provider a list of all the medicines, herbs, non-prescription drugs, or dietary supplements you use. Also  tell them if you smoke, drink alcohol, or use illegal drugs. Some items may interact with your medicine. What should I watch for while using this medicine? This drug may make you feel generally unwell. This is not uncommon, as chemotherapy can affect healthy cells as well as cancer cells. Report any side effects. Continue your course of treatment even though you feel ill unless your doctor tells you to stop. You may need blood work done while you are taking this medicine. Do not become pregnant while taking this medicine or for 2 months after stopping it. Women should inform their doctor if they wish to become pregnant or think they might be pregnant. Men should not father a child while taking this medicine and for 2 months after stopping it. There is a potential for serious side effects to an unborn child. Talk to your health care professional or pharmacist for more information. Do not breast-feed an infant while taking this medicine. If you are going to have surgery or any other procedures, tell your doctor you are taking this medicine. Talk to your doctor about your risk of cancer. You may be more at risk for certain types of cancers if you take this medicine. What side effects may I notice from receiving this medicine? Side effects that you should report to your doctor or health care professional as soon as possible: -allergic reactions like skin rash, itching or hives, swelling of the face, lips, or tongue -bloody or black, tarry stools -breathing problems -changes in vision -chest pain or chest tightness -confusion -dizziness -feeling faint or lightheaded -high fever -light-colored stools -nausea, vomiting -red or dark-brown urine -red spots on the skin -right upper  belly pain -seizures -severe headache -sores on the hands or feet -spitting up blood or brown material that looks like coffee grounds -stomach pain -unusual bruising or bleeding from the eye, gums, or nose -unusually  weak or tired -yellowing of the eyes or skin Side effects that usually do not require medical attention (Report these to your doctor or health care professional if they continue or are bothersome.): -diarrhea -hoarseness -loss of appetite -sore throat -tiredness -weight loss This list may not describe all possible side effects. Call your doctor for medical advice about side effects. You may report side effects to FDA at 1-800-FDA-1088. Where should I keep my medicine? Keep out of the reach of children. Store between 20 and 25 degrees C (68 and 77 degrees F). Keep this medicine in the original container. Throw away any unused medicine after the expiration date. NOTE: This sheet is a summary. It may not cover all possible information. If you have questions about this medicine, talk to your doctor, pharmacist, or health care provider.    2016, Elsevier/Gold Standard. (2014-05-29 14:34:12)

## 2016-02-12 LAB — CEA: CEA: 1708 ng/mL — ABNORMAL HIGH (ref 0.0–4.7)

## 2016-02-13 ENCOUNTER — Other Ambulatory Visit: Payer: Self-pay | Admitting: *Deleted

## 2016-02-13 ENCOUNTER — Other Ambulatory Visit: Payer: Self-pay | Admitting: Hematology and Oncology

## 2016-02-13 DIAGNOSIS — C189 Malignant neoplasm of colon, unspecified: Secondary | ICD-10-CM

## 2016-02-13 DIAGNOSIS — C787 Secondary malignant neoplasm of liver and intrahepatic bile duct: Principal | ICD-10-CM

## 2016-02-13 DIAGNOSIS — C187 Malignant neoplasm of sigmoid colon: Secondary | ICD-10-CM

## 2016-02-13 MED ORDER — REGORAFENIB 40 MG PO TABS: 160.0000 mg | ORAL_TABLET | Freq: Every day | ORAL | 0 refills | Status: DC

## 2016-02-14 ENCOUNTER — Other Ambulatory Visit: Payer: Self-pay | Admitting: *Deleted

## 2016-02-14 DIAGNOSIS — C787 Secondary malignant neoplasm of liver and intrahepatic bile duct: Principal | ICD-10-CM

## 2016-02-14 DIAGNOSIS — C189 Malignant neoplasm of colon, unspecified: Secondary | ICD-10-CM

## 2016-02-14 MED ORDER — REGORAFENIB 40 MG PO TABS: 160.0000 mg | ORAL_TABLET | Freq: Every day | ORAL | 0 refills | Status: DC

## 2016-02-17 ENCOUNTER — Other Ambulatory Visit: Payer: Self-pay | Admitting: *Deleted

## 2016-02-17 DIAGNOSIS — C787 Secondary malignant neoplasm of liver and intrahepatic bile duct: Principal | ICD-10-CM

## 2016-02-17 DIAGNOSIS — C189 Malignant neoplasm of colon, unspecified: Secondary | ICD-10-CM

## 2016-02-17 MED ORDER — REGORAFENIB 40 MG PO TABS: ORAL_TABLET | ORAL | 0 refills | Status: DC

## 2016-02-20 ENCOUNTER — Other Ambulatory Visit: Payer: Self-pay | Admitting: *Deleted

## 2016-02-20 MED ORDER — OXYCODONE HCL 5 MG PO TABS: 5.0000 mg | ORAL_TABLET | Freq: Four times a day (QID) | ORAL | 0 refills | Status: DC | PRN

## 2016-02-29 ENCOUNTER — Inpatient Hospital Stay
Admission: EM | Admit: 2016-02-29 | Discharge: 2016-03-03 | DRG: 176 | Disposition: A | Discharge status: Home / Self Care | Payer: Medicare Other | Attending: Internal Medicine | Admitting: Internal Medicine

## 2016-02-29 ENCOUNTER — Emergency Department: Payer: Medicare Other

## 2016-02-29 DIAGNOSIS — L8990 Pressure ulcer of unspecified site, unspecified stage: Secondary | ICD-10-CM | POA: Insufficient documentation

## 2016-02-29 DIAGNOSIS — G4700 Insomnia, unspecified: Secondary | ICD-10-CM | POA: Diagnosis present

## 2016-02-29 DIAGNOSIS — C189 Malignant neoplasm of colon, unspecified: Secondary | ICD-10-CM | POA: Diagnosis present

## 2016-02-29 DIAGNOSIS — Z87891 Personal history of nicotine dependence: Secondary | ICD-10-CM

## 2016-02-29 DIAGNOSIS — Z79899 Other long term (current) drug therapy: Secondary | ICD-10-CM

## 2016-02-29 DIAGNOSIS — R531 Weakness: Secondary | ICD-10-CM | POA: Diagnosis not present

## 2016-02-29 DIAGNOSIS — H9190 Unspecified hearing loss, unspecified ear: Secondary | ICD-10-CM | POA: Diagnosis present

## 2016-02-29 DIAGNOSIS — I2699 Other pulmonary embolism without acute cor pulmonale: Principal | ICD-10-CM | POA: Diagnosis present

## 2016-02-29 DIAGNOSIS — D72829 Elevated white blood cell count, unspecified: Secondary | ICD-10-CM

## 2016-02-29 DIAGNOSIS — E119 Type 2 diabetes mellitus without complications: Secondary | ICD-10-CM | POA: Diagnosis present

## 2016-02-29 DIAGNOSIS — G893 Neoplasm related pain (acute) (chronic): Secondary | ICD-10-CM | POA: Diagnosis present

## 2016-02-29 DIAGNOSIS — C787 Secondary malignant neoplasm of liver and intrahepatic bile duct: Secondary | ICD-10-CM | POA: Diagnosis present

## 2016-02-29 DIAGNOSIS — T451X5A Adverse effect of antineoplastic and immunosuppressive drugs, initial encounter: Secondary | ICD-10-CM | POA: Diagnosis present

## 2016-02-29 DIAGNOSIS — Z888 Allergy status to other drugs, medicaments and biological substances status: Secondary | ICD-10-CM

## 2016-02-29 DIAGNOSIS — Z79818 Long term (current) use of other agents affecting estrogen receptors and estrogen levels: Secondary | ICD-10-CM

## 2016-02-29 DIAGNOSIS — R451 Restlessness and agitation: Secondary | ICD-10-CM | POA: Diagnosis not present

## 2016-02-29 DIAGNOSIS — G620 Drug-induced polyneuropathy: Secondary | ICD-10-CM | POA: Diagnosis present

## 2016-02-29 DIAGNOSIS — Z7984 Long term (current) use of oral hypoglycemic drugs: Secondary | ICD-10-CM

## 2016-02-29 DIAGNOSIS — I10 Essential (primary) hypertension: Secondary | ICD-10-CM | POA: Diagnosis present

## 2016-02-29 DIAGNOSIS — Z7982 Long term (current) use of aspirin: Secondary | ICD-10-CM

## 2016-02-29 LAB — COMPREHENSIVE METABOLIC PANEL
ALBUMIN: 1.9 g/dL — AB (ref 3.5–5.0)
ALK PHOS: 182 U/L — AB (ref 38–126)
ALT: 13 U/L — ABNORMAL LOW (ref 14–54)
ANION GAP: 8 (ref 5–15)
AST: 28 U/L (ref 15–41)
BUN: 19 mg/dL (ref 6–20)
CHLORIDE: 110 mmol/L (ref 101–111)
CO2: 21 mmol/L — AB (ref 22–32)
Calcium: 8.2 mg/dL — ABNORMAL LOW (ref 8.9–10.3)
Creatinine, Ser: 1.25 mg/dL — ABNORMAL HIGH (ref 0.44–1.00)
GFR calc Af Amer: 49 mL/min — ABNORMAL LOW (ref >60)
GFR calc non Af Amer: 42 mL/min — ABNORMAL LOW (ref >60)
GLUCOSE: 86 mg/dL (ref 65–99)
POTASSIUM: 4.5 mmol/L (ref 3.5–5.1)
SODIUM: 139 mmol/L (ref 135–145)
Total Bilirubin: 0.4 mg/dL (ref 0.3–1.2)
Total Protein: 5 g/dL — ABNORMAL LOW (ref 6.5–8.1)

## 2016-02-29 LAB — URINALYSIS COMPLETE WITH MICROSCOPIC (ARMC ONLY)
BACTERIA UA: NONE SEEN
Bilirubin Urine: NEGATIVE
Glucose, UA: NEGATIVE mg/dL
HGB URINE DIPSTICK: NEGATIVE
Ketones, ur: NEGATIVE mg/dL
LEUKOCYTES UA: NEGATIVE
Nitrite: NEGATIVE
PROTEIN: NEGATIVE mg/dL
SPECIFIC GRAVITY, URINE: 1.015 (ref 1.005–1.030)
SQUAMOUS EPITHELIAL / LPF: NONE SEEN
pH: 5 (ref 5.0–8.0)

## 2016-02-29 LAB — CBC WITH DIFFERENTIAL/PLATELET
BASOS PCT: 1 %
Basophils Absolute: 0.1 10*3/uL (ref 0–0.1)
EOS ABS: 0 10*3/uL (ref 0–0.7)
Eosinophils Relative: 0 %
HCT: 29.7 % — ABNORMAL LOW (ref 35.0–47.0)
HEMOGLOBIN: 9.7 g/dL — AB (ref 12.0–16.0)
Lymphocytes Relative: 6 %
Lymphs Abs: 0.8 10*3/uL — ABNORMAL LOW (ref 1.0–3.6)
MCH: 29.8 pg (ref 26.0–34.0)
MCHC: 32.7 g/dL (ref 32.0–36.0)
MCV: 91.1 fL (ref 80.0–100.0)
MONOS PCT: 5 %
Monocytes Absolute: 0.7 10*3/uL (ref 0.2–0.9)
NEUTROS PCT: 88 %
Neutro Abs: 12.3 10*3/uL — ABNORMAL HIGH (ref 1.4–6.5)
Platelets: 405 10*3/uL (ref 150–440)
RBC: 3.26 MIL/uL — ABNORMAL LOW (ref 3.80–5.20)
RDW: 17.5 % — AB (ref 11.5–14.5)
WBC: 13.9 10*3/uL — AB (ref 3.6–11.0)

## 2016-02-29 LAB — MAGNESIUM: Magnesium: 1.5 mg/dL — ABNORMAL LOW (ref 1.7–2.4)

## 2016-02-29 LAB — TROPONIN I

## 2016-02-29 MED ORDER — MAGNESIUM SULFATE 2 GM/50ML IV SOLN
2.0000 g | Freq: Once | INTRAVENOUS | Status: AC
  Administered 2016-02-29: 2 g via INTRAVENOUS
  Filled 2016-02-29: qty 50

## 2016-02-29 MED ORDER — OXYCODONE HCL 5 MG PO TABS
ORAL_TABLET | ORAL | Status: AC
  Filled 2016-02-29: qty 1

## 2016-02-29 MED ORDER — FENTANYL CITRATE (PF) 100 MCG/2ML IJ SOLN
100.0000 ug | Freq: Once | INTRAMUSCULAR | Status: DC
  Filled 2016-02-29: qty 2

## 2016-02-29 MED ORDER — SODIUM CHLORIDE 0.9 % IV BOLUS (SEPSIS)
1000.0000 mL | Freq: Once | INTRAVENOUS | Status: AC
Start: 2016-02-29 — End: 2016-03-01
  Administered 2016-02-29: 1000 mL via INTRAVENOUS

## 2016-02-29 MED ORDER — IOPAMIDOL (ISOVUE-370) INJECTION 76%
60.0000 mL | Freq: Once | INTRAVENOUS | Status: AC | PRN
  Administered 2016-02-29: 60 mL via INTRAVENOUS

## 2016-02-29 NOTE — ED Notes (Signed)
Patient called out c/o abdominal pain. MD Quentin Cornwall notified.

## 2016-02-29 NOTE — ED Provider Notes (Signed)
[facility] Emergency Department Provider Note    First MD Initiated Contact with Patient 02/29/16 2103     (approximate)  I have reviewed the triage vital signs and the nursing notes.   HISTORY  Chief Complaint Weakness    HPI Sandy [name] is a 70 y.o. female with a history of colon cancer with metastasis to the liver presents with 1 day of generalized malaise and weakness. Patient states that she is still undergoing chemotherapy. Reportedly EMS was called out to the house earlier today for chest pain and later for shortness of breath. Second time that they went the patient denies any shortness of breath but stated that she was feeling generalized weakness and is agreeable to being brought into the ER for further evaluation. She denies any fevers. Denies any abdominal pain. No nausea or vomiting. No recent medication changes.   Past Medical History:  Diagnosis Date  . Cancer ([place])    liver mets; colon cancer  . Constipation   . Diabetes mellitus without complication ([place])   . HOH (hard of hearing)   . Hypertension   . Insomnia   . 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 Wears dentures    partial upper and lower   Family History  Problem Relation Age of Onset  . Cancer Sister    Past Surgical History:  Procedure Laterality Date  . CARDIAC CATHETERIZATION    . COLONOSCOPY WITH PROPOFOL N/A 09/07/2014   Procedure: COLONOSCOPY WITH PROPOFOL;  Surgeon: Lucilla Lame, MD;  Location: [facility] ENDOSCOPY;  Service: Endoscopy;  Laterality: N/A;  . ESOPHAGOGASTRODUODENOSCOPY N/A 09/07/2014   Procedure: ESOPHAGOGASTRODUODENOSCOPY (EGD);  Surgeon: Lucilla Lame, MD;  Location: [facility] ENDOSCOPY;  Service: Endoscopy;  Laterality: N/A;  . FLEXIBLE SIGMOIDOSCOPY N/A 09/02/2015   Procedure: FLEXIBLE SIGMOIDOSCOPY;  Surgeon: Lucilla Lame, MD;  Location: [place];  Service: Endoscopy;  Laterality: N/A;  Diabetic - oral meds Pt has port-a-cath    . LIVER BIOPSY    . PORTACATH PLACEMENT Left 09/24/2014   Procedure: INSERTION PORT-A-CATH;  Surgeon: Robert Bellow, MD;  Location: [facility] ORS;  Service: General;  Laterality: Left;   Patient Active Problem List   Diagnosis Date Noted  . 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 Acute encephalopathy 12/31/2015  . Palliative care encounter   . Weakness generalized   . Poisoning by unspecified drug or medicinal substance, undetermined whether accidentally or purposely inflicted(E980.5)   . Antineoplastic chemotherapy induced pancytopenia ([place])   . Thyroid nodule 09/24/2015  . Personal history of colon cancer   . Thyroid nodule 08/30/2015  . Malignant neoplasm of sigmoid colon ([place]) 08/27/2015  . Neuropathy ([place]) 04/29/2015  . Muscle pain, lumbar 02/12/2015  . Hypomagnesemia 01/15/2015  . Colon cancer metastasized to liver ([place]) 09/18/2014  . Cancer related pain 09/18/2014  . Anemia 09/18/2014  . Anorexia 09/18/2014  . Weight loss, unintentional 09/18/2014  . Benign essential HTN 09/03/2014  . Type 2 diabetes mellitus ([place]) 09/03/2014  . Personal history of nicotine dependence 09/03/2014  . Combined fat and carbohydrate induced hyperlipemia 09/03/2014      Prior to Admission medications   Medication Sig Start Date End Date Taking? Authorizing Provider  amiodarone (PACERONE) 400 MG tablet Take 0.5 tablets (200 mg total) by mouth 2 (two) times daily. Patient taking differently: Take 200 mg by mouth daily.  01/05/16   Dustin Flock, MD  aspirin EC 81 MG tablet Take 81 mg by  mouth daily. Reported on 08/26/2015    Historical Provider, MD  atorvastatin (LIPITOR) 40 MG tablet Take 40 mg by mouth daily.    Historical Provider, MD  diphenoxylate-atropine (LOMOTIL) 2.5-0.025 MG tablet 1-2 tablets every 6 hours as needed for diarrhea 12/26/15   Lequita Asal, MD  feeding supplement, ENSURE ENLIVE, (ENSURE ENLIVE) LIQD Take 237 mLs by mouth 2 (two) times daily between meals.  10/22/15   Vaughan Basta, MD  gabapentin (NEURONTIN) 100 MG capsule Take 2 capsules (200 mg total) by mouth at bedtime. 1 tab daily and 2 tabs at bedtime. Patient taking differently: Take 200 mg by mouth at bedtime. 1 tab in the morning daily and 2 capsules at bedtime. 07/16/15   Lequita Asal, MD  lidocaine-prilocaine (EMLA) cream Apply 1 application topically as needed. Apply to port 1 hour prior to chemotherapy appointment. Cover with plastic wrap. 07/08/15   Lequita Asal, MD  lisinopril (PRINIVIL,ZESTRIL) 10 MG tablet Take 10 mg by mouth daily.    Historical Provider, MD  magnesium 30 MG tablet Take 30 mg by mouth daily.     Historical Provider, MD  megestrol (MEGACE) 400 MG/10ML suspension Take 5 mLs (200 mg total) by mouth daily. to stimulate appetite 11/06/14   Lequita Asal, MD  metFORMIN (GLUCOPHAGE) 1000 MG tablet Take 1,000 mg by mouth 2 (two) times daily.     Historical Provider, MD  metoprolol tartrate (LOPRESSOR) 25 MG tablet Take 1 tablet (25 mg total) by mouth 2 (two) times daily. Patient not taking: Reported on 02/11/2016 01/05/16   Dustin Flock, MD  Multiple Vitamin (MULTIVITAMIN WITH MINERALS) TABS tablet Take 1 tablet by mouth daily.    Historical Provider, MD  ondansetron (ZOFRAN) 4 MG tablet Take 1 tablet (4 mg total) by mouth every 8 (eight) hours as needed for nausea or vomiting. 12/19/15   Lequita Asal, MD  oxyCODONE (OXY IR/ROXICODONE) 5 MG immediate release tablet Take 1 tablet (5 mg total) by mouth every 6 (six) hours as needed for severe pain. 02/20/16   Lequita Asal, MD  regorafenib (STIVARGA) 40 MG tablet Take with low fat meal. Caution: Chemotherapy. Take for 21 days and then 7 days off 02/17/16   Lequita Asal, MD  traZODone (DESYREL) 50 MG tablet Take 0.5 tablets (25 mg total) by mouth at bedtime as needed for sleep. 10/22/15   Vaughan Basta, MD    Allergies Tylenol [acetaminophen]    Social History Social History    Substance Use Topics  . Smoking status: Former Smoker    Packs/day: 0.25    Years: 20.00    Types: Cigarettes    Quit date: 09/06/2013  . Smokeless tobacco: Never Used  . Alcohol use No    Review of Systems Patient denies headaches, rhinorrhea, blurry vision, numbness, shortness of breath, chest pain, edema, cough, abdominal pain, nausea, vomiting, diarrhea, dysuria, fevers, rashes or hallucinations unless otherwise stated above in HPI. ____________________________________________   PHYSICAL EXAM:  VITAL SIGNS: Vitals:   02/29/16 2107  BP: 137/80  Pulse: 87  Resp: 19  Temp: 98.2 F (36.8 C)    Constitutional: Alert and oriented.  Hard of hearing. ill appearing, but  in no acute distress. Eyes: Conjunctivae are pale,  PERRL. EOMI. Head: Atraumatic. Nose: No congestion/rhinnorhea. Mouth/Throat: Mucous membranes are moist.  Oropharynx non-erythematous. Neck: No stridor. Painless ROM. No cervical spine tenderness to palpation Hematological/Lymphatic/Immunilogical: No cervical lymphadenopathy. Cardiovascular: Normal rate, regular rhythm. Grossly normal heart sounds.  Good peripheral circulation.  Respiratory: Normal respiratory effort.  No retractions. Lungs CTAB. Gastrointestinal: Soft and nontender. No distention. No abdominal bruits. No CVA tenderness. Musculoskeletal: No lower extremity tenderness nor edema.  No joint effusions. Neurologic:  Normal speech and language. No gross focal neurologic deficits are appreciated. No gait instability. Skin:  Skin is warm, dry and intact. No rash noted. Psychiatric: Mood and affect are normal. Speech and behavior are normal.  ____________________________________________   LABS (all labs ordered are listed, but only abnormal results are displayed)  Results for orders placed or performed during the hospital encounter of 02/29/16 (from the past 24 hour(s))  CBC with Differential/Platelet     Status: Abnormal   Collection Time:  02/29/16  9:15 PM  Result Value Ref Range   WBC 13.9 (H) 3.6 - 11.0 K/uL   RBC 3.26 (L) 3.80 - 5.20 MIL/uL   Hemoglobin 9.7 (L) 12.0 - 16.0 g/dL   HCT 29.7 (L) 35.0 - 47.0 %   MCV 91.1 80.0 - 100.0 fL   MCH 29.8 26.0 - 34.0 pg   MCHC 32.7 32.0 - 36.0 g/dL   RDW 17.5 (H) 11.5 - 14.5 %   Platelets 405 150 - 440 K/uL   Neutrophils Relative % 88 %   Neutro Abs 12.3 (H) 1.4 - 6.5 K/uL   Lymphocytes Relative 6 %   Lymphs Abs 0.8 (L) 1.0 - 3.6 K/uL   Monocytes Relative 5 %   Monocytes Absolute 0.7 0.2 - 0.9 K/uL   Eosinophils Relative 0 %   Eosinophils Absolute 0.0 0 - 0.7 K/uL   Basophils Relative 1 %   Basophils Absolute 0.1 0 - 0.1 K/uL  Comprehensive metabolic panel     Status: Abnormal   Collection Time: 02/29/16  9:15 PM  Result Value Ref Range   Sodium 139 135 - 145 mmol/L   Potassium 4.5 3.5 - 5.1 mmol/L   Chloride 110 101 - 111 mmol/L   CO2 21 (L) 22 - 32 mmol/L   Glucose, Bld 86 65 - 99 mg/dL   BUN 19 6 - 20 mg/dL   Creatinine, Ser 1.25 (H) 0.44 - 1.00 mg/dL   Calcium 8.2 (L) 8.9 - 10.3 mg/dL   Total Protein 5.0 (L) 6.5 - 8.1 g/dL   Albumin 1.9 (L) 3.5 - 5.0 g/dL   AST 28 15 - 41 U/L   ALT 13 (L) 14 - 54 U/L   Alkaline Phosphatase 182 (H) 38 - 126 U/L   Total Bilirubin 0.4 0.3 - 1.2 mg/dL   GFR calc non Af Amer 42 (L) >60 mL/min   GFR calc Af Amer 49 (L) >60 mL/min   Anion gap 8 5 - 15  Troponin I     Status: None   Collection Time: 02/29/16  9:15 PM  Result Value Ref Range   Troponin I <0.03 <0.03 ng/mL  Magnesium     Status: Abnormal   Collection Time: 02/29/16  9:15 PM  Result Value Ref Range   Magnesium 1.5 (L) 1.7 - 2.4 mg/dL  Urinalysis complete, with microscopic ([facility] only)     Status: Abnormal   Collection Time: 02/29/16  9:42 PM  Result Value Ref Range   Color, Urine YELLOW (A) YELLOW   APPearance CLEAR (A) CLEAR   Glucose, UA NEGATIVE NEGATIVE mg/dL   Bilirubin Urine NEGATIVE NEGATIVE   Ketones, ur NEGATIVE NEGATIVE mg/dL   Specific Gravity, Urine  1.015 1.005 - 1.030   Hgb urine dipstick NEGATIVE NEGATIVE   pH 5.0 5.0 -  8.0   Protein, ur NEGATIVE NEGATIVE mg/dL   Nitrite NEGATIVE NEGATIVE   Leukocytes, UA NEGATIVE NEGATIVE   RBC / HPF 0-5 0 - 5 RBC/hpf   WBC, UA 0-5 0 - 5 WBC/hpf   Bacteria, UA NONE SEEN NONE SEEN   Squamous Epithelial / LPF NONE SEEN NONE SEEN   Mucous PRESENT    Hyaline Casts, UA PRESENT    ____________________________________________  EKG My review and personal interpretation at Time: 21:05   Indication: weakness  Rate: 85  Rhythm: sinus Axis: normal Other: nonspecific st changes, no STEMI ____________________________________________  RADIOLOGY  I personally reviewed all radiographic images ordered to evaluate for the above acute complaints and reviewed radiology reports and findings.  These findings were personally discussed with the patient.  Please see medical record for radiology report. ____________________________________________   PROCEDURES  Procedure(s) performed: none Procedures    Critical Care performed: yes CRITICAL CARE Performed by: Merlyn Lot   Total critical care time: 37 minutes  Critical care time was exclusive of separately billable procedures and treating other patients.  Critical care was necessary to treat or prevent imminent or life-threatening deterioration.  Critical care was time spent personally by me on the following activities: development of treatment plan with patient and/or surrogate as well as nursing, discussions with consultants, evaluation of patient's response to treatment, examination of patient, obtaining history from patient or surrogate, ordering and performing treatments and interventions, ordering and review of laboratory studies, ordering and review of radiographic studies, pulse oximetry and re-evaluation of patient's condition.  ____________________________________________   INITIAL IMPRESSION / ASSESSMENT AND PLAN / ED  COURSE  Pertinent labs & imaging results that were available during my care of the patient were reviewed by me and considered in my medical decision making (see chart for details).  DDX: sepsis, pneumonia, dehydration, acs, renal failure, liver failure   Sandy [name] is a 70 y.o. who presents to the ED with generalized malaise and weakness.  Patient is AFVSS in ED. Exam as above. Given current presentation have considered the above differential.  Accomplished presentation given her multiple comorbidities and undergoing active chemotherapy with metastatic cancer. Based on her presentation will order labs as well as radiographic imaging to evaluate for the above differential.  The patient will be placed on continuous pulse oximetry and telemetry for monitoring.  Laboratory evaluation will be sent to evaluate for the above complaints.     Clinical Course as of Mar 01 1  Sat Feb 29, 2016  2303 Rechecked on patient with family now bedside. States that she has been complaining of right-sided chest pain and having more altered mental status in the past several days.  Repeat abdominal exam is reassuring. She has no focal neuro deficits.  [PR]  Sun Mar 01, 2016  0001 CT imaging shows evidence of acute pulmonary embolism of the right middle lobe which would correspond with the patient's acute chest pain and explain her generalized weakness. CT imaging also shows evidence of probable underlying pulmonary hypertension.  I have ordered heparin as the patient does have underlying renal dysfunction. Will speak with hospitalist regarding admission for further evaluation and management.  [PR]    Clinical Course User Index [PR] Merlyn Lot, MD     ____________________________________________   FINAL CLINICAL IMPRESSION(S) / ED DIAGNOSES  Final diagnoses:  Other acute pulmonary embolism without acute cor pulmonale (HCC)  Weakness  Hypomagnesemia  Leukocytosis, unspecified type      NEW  MEDICATIONS STARTED DURING THIS VISIT:  New Prescriptions   No medications on file     Note:  This document was prepared using Dragon voice recognition software and may include unintentional dictation errors.    Merlyn Lot, MD 03/01/16 0005

## 2016-02-29 NOTE — ED Triage Notes (Signed)
Per EMS: EMS called out today for SOB per patient's daughter. Patient herself c/o malaise and weakness X 1 day. EMS called out earlier today for left sided-chest pain.   Patient denies chest pain, SOB. Pt reports N/V

## 2016-02-29 NOTE — ED Notes (Signed)
RN notified MD Quentin Cornwall of patient/family refusal of fentanyl. MD Quentin Cornwall ordered 5 mg Oxycodone immediate release. RN to room to give medication. PT denied any pain. Pt refused medication.   Patient's daughter requesting patient be given oxycodone. Pt continues to deny pain. RN held medication. RN notified MD

## 2016-02-29 NOTE — ED Notes (Signed)
MD Robinson at bedside 

## 2016-02-29 NOTE — ED Notes (Signed)
Pt screaming out in pain, states "help me my stomach hurts". md notified, order for fentanyl 123mcg received from dr. Quentin Cornwall. Verified dosage with me of 146mcg, md states "give her 100, she's a cancer patient."

## 2016-02-29 NOTE — ED Notes (Signed)
Patient transported to X-ray 

## 2016-02-29 NOTE — ED Notes (Addendum)
RN called out to lobby for patient's daughter, [place]

## 2016-03-01 ENCOUNTER — Observation Stay
Admit: 2016-03-01 | Discharge: 2016-03-01 | Disposition: A | Discharge status: Home / Self Care | Payer: Medicare Other | Attending: Internal Medicine | Admitting: Internal Medicine

## 2016-03-01 DIAGNOSIS — Z7984 Long term (current) use of oral hypoglycemic drugs: Secondary | ICD-10-CM | POA: Diagnosis not present

## 2016-03-01 DIAGNOSIS — G620 Drug-induced polyneuropathy: Secondary | ICD-10-CM | POA: Diagnosis present

## 2016-03-01 DIAGNOSIS — H9190 Unspecified hearing loss, unspecified ear: Secondary | ICD-10-CM | POA: Diagnosis present

## 2016-03-01 DIAGNOSIS — T451X5A Adverse effect of antineoplastic and immunosuppressive drugs, initial encounter: Secondary | ICD-10-CM | POA: Diagnosis present

## 2016-03-01 DIAGNOSIS — I2699 Other pulmonary embolism without acute cor pulmonale: Secondary | ICD-10-CM | POA: Diagnosis present

## 2016-03-01 DIAGNOSIS — Z79899 Other long term (current) drug therapy: Secondary | ICD-10-CM | POA: Diagnosis not present

## 2016-03-01 DIAGNOSIS — Z7982 Long term (current) use of aspirin: Secondary | ICD-10-CM | POA: Diagnosis not present

## 2016-03-01 DIAGNOSIS — C787 Secondary malignant neoplasm of liver and intrahepatic bile duct: Secondary | ICD-10-CM | POA: Diagnosis present

## 2016-03-01 DIAGNOSIS — I10 Essential (primary) hypertension: Secondary | ICD-10-CM | POA: Diagnosis present

## 2016-03-01 DIAGNOSIS — Z87891 Personal history of nicotine dependence: Secondary | ICD-10-CM | POA: Diagnosis not present

## 2016-03-01 DIAGNOSIS — G4700 Insomnia, unspecified: Secondary | ICD-10-CM | POA: Diagnosis present

## 2016-03-01 DIAGNOSIS — R451 Restlessness and agitation: Secondary | ICD-10-CM | POA: Diagnosis not present

## 2016-03-01 DIAGNOSIS — C189 Malignant neoplasm of colon, unspecified: Secondary | ICD-10-CM | POA: Diagnosis present

## 2016-03-01 DIAGNOSIS — Z79818 Long term (current) use of other agents affecting estrogen receptors and estrogen levels: Secondary | ICD-10-CM | POA: Diagnosis not present

## 2016-03-01 DIAGNOSIS — G893 Neoplasm related pain (acute) (chronic): Secondary | ICD-10-CM | POA: Diagnosis present

## 2016-03-01 DIAGNOSIS — L8990 Pressure ulcer of unspecified site, unspecified stage: Secondary | ICD-10-CM | POA: Insufficient documentation

## 2016-03-01 DIAGNOSIS — Z888 Allergy status to other drugs, medicaments and biological substances status: Secondary | ICD-10-CM | POA: Diagnosis not present

## 2016-03-01 DIAGNOSIS — R531 Weakness: Secondary | ICD-10-CM | POA: Diagnosis present

## 2016-03-01 DIAGNOSIS — E119 Type 2 diabetes mellitus without complications: Secondary | ICD-10-CM | POA: Diagnosis present

## 2016-03-01 LAB — CBC
HCT: 26.1 % — ABNORMAL LOW (ref 35.0–47.0)
Hemoglobin: 8.6 g/dL — ABNORMAL LOW (ref 12.0–16.0)
MCH: 29.5 pg (ref 26.0–34.0)
MCHC: 33.1 g/dL (ref 32.0–36.0)
MCV: 89 fL (ref 80.0–100.0)
PLATELETS: 350 10*3/uL (ref 150–440)
RBC: 2.93 MIL/uL — AB (ref 3.80–5.20)
RDW: 18 % — ABNORMAL HIGH (ref 11.5–14.5)
WBC: 10.6 10*3/uL (ref 3.6–11.0)

## 2016-03-01 LAB — BASIC METABOLIC PANEL
Anion gap: 6 (ref 5–15)
BUN: 17 mg/dL (ref 6–20)
CO2: 19 mmol/L — ABNORMAL LOW (ref 22–32)
Calcium: 8 mg/dL — ABNORMAL LOW (ref 8.9–10.3)
Chloride: 114 mmol/L — ABNORMAL HIGH (ref 101–111)
Creatinine, Ser: 1.02 mg/dL — ABNORMAL HIGH (ref 0.44–1.00)
GFR calc Af Amer: >60 mL/min (ref >60)
GFR calc non Af Amer: 54 mL/min — ABNORMAL LOW (ref >60)
Glucose, Bld: 102 mg/dL — ABNORMAL HIGH (ref 65–99)
Potassium: 4.3 mmol/L (ref 3.5–5.1)
Sodium: 139 mmol/L (ref 135–145)

## 2016-03-01 LAB — ECHOCARDIOGRAM COMPLETE
Height: 65 in
Weight: 2451 oz

## 2016-03-01 LAB — GLUCOSE, CAPILLARY
GLUCOSE-CAPILLARY: 69 mg/dL (ref 65–99)
Glucose-Capillary: 68 mg/dL (ref 65–99)
Glucose-Capillary: 68 mg/dL (ref 65–99)
Glucose-Capillary: 73 mg/dL (ref 65–99)
Glucose-Capillary: 89 mg/dL (ref 65–99)

## 2016-03-01 LAB — BRAIN NATRIURETIC PEPTIDE: B NATRIURETIC PEPTIDE 5: 116 pg/mL — AB (ref 0.0–100.0)

## 2016-03-01 LAB — PROTIME-INR
INR: 1.27
PROTHROMBIN TIME: 16 s — AB (ref 11.4–15.2)

## 2016-03-01 LAB — HEPARIN LEVEL (UNFRACTIONATED)
HEPARIN UNFRACTIONATED: 0.79 [IU]/mL — AB (ref 0.30–0.70)
HEPARIN UNFRACTIONATED: 0.73 [IU]/mL — AB (ref 0.30–0.70)

## 2016-03-01 LAB — APTT: aPTT: 39 seconds — ABNORMAL HIGH (ref 24–36)

## 2016-03-01 MED ORDER — ONDANSETRON HCL 4 MG PO TABS: 4.0000 mg | ORAL_TABLET | Freq: Four times a day (QID) | ORAL | Status: DC | PRN

## 2016-03-01 MED ORDER — ONDANSETRON HCL 4 MG/2ML IJ SOLN: 4.0000 mg | Freq: Four times a day (QID) | INTRAMUSCULAR | Status: DC | PRN

## 2016-03-01 MED ORDER — INSULIN ASPART 100 UNIT/ML ~~LOC~~ SOLN: 0.0000 [IU] | Freq: Three times a day (TID) | SUBCUTANEOUS | Status: DC

## 2016-03-01 MED ORDER — MEGESTROL ACETATE 400 MG/10ML PO SUSP
200.0000 mg | Freq: Two times a day (BID) | ORAL | Status: DC
  Administered 2016-03-01 – 2016-03-03 (×5): 200 mg via ORAL
  Filled 2016-03-01 (×6): qty 5

## 2016-03-01 MED ORDER — HEPARIN (PORCINE) IN NACL 100-0.45 UNIT/ML-% IJ SOLN
900.0000 [IU]/h | INTRAMUSCULAR | Status: DC
  Administered 2016-03-01: 1200 [IU]/h via INTRAVENOUS
  Administered 2016-03-01: 20:00:00 1000 [IU]/h via INTRAVENOUS
  Filled 2016-03-01 (×4): qty 250

## 2016-03-01 MED ORDER — INSULIN ASPART 100 UNIT/ML ~~LOC~~ SOLN: 0.0000 [IU] | Freq: Every day | SUBCUTANEOUS | Status: DC

## 2016-03-01 MED ORDER — AMIODARONE HCL 200 MG PO TABS
400.0000 mg | ORAL_TABLET | Freq: Every day | ORAL | Status: DC
  Administered 2016-03-01 – 2016-03-03 (×3): 400 mg via ORAL
  Filled 2016-03-01 (×3): qty 2

## 2016-03-01 MED ORDER — ATORVASTATIN CALCIUM 20 MG PO TABS
40.0000 mg | ORAL_TABLET | Freq: Every day | ORAL | Status: DC
  Administered 2016-03-01 – 2016-03-03 (×3): 40 mg via ORAL
  Filled 2016-03-01 (×3): qty 2

## 2016-03-01 MED ORDER — SODIUM CHLORIDE 0.9% FLUSH: 3.0000 mL | Freq: Two times a day (BID) | INTRAVENOUS | Status: DC

## 2016-03-01 MED ORDER — HEPARIN BOLUS VIA INFUSION
4200.0000 [IU] | Freq: Once | INTRAVENOUS | Status: AC
  Administered 2016-03-01: 4200 [IU] via INTRAVENOUS
  Filled 2016-03-01: qty 4200

## 2016-03-01 MED ORDER — OXYCODONE HCL 5 MG PO TABS
5.0000 mg | ORAL_TABLET | ORAL | Status: DC | PRN
  Administered 2016-03-01 – 2016-03-03 (×3): 5 mg via ORAL
  Filled 2016-03-01 (×3): qty 1

## 2016-03-01 MED ORDER — LISINOPRIL 10 MG PO TABS
10.0000 mg | ORAL_TABLET | Freq: Every day | ORAL | Status: DC
  Administered 2016-03-01 – 2016-03-03 (×3): 10 mg via ORAL
  Filled 2016-03-01 (×3): qty 1

## 2016-03-01 MED ORDER — ASPIRIN EC 81 MG PO TBEC
81.0000 mg | DELAYED_RELEASE_TABLET | Freq: Every day | ORAL | Status: DC
  Administered 2016-03-01 – 2016-03-03 (×3): 81 mg via ORAL
  Filled 2016-03-01 (×3): qty 1

## 2016-03-01 NOTE — Progress Notes (Signed)
*  PRELIMINARY RESULTS* Echocardiogram 2D Echocardiogram has been performed.  Sandy [name] 03/01/2016, 10:10 AM

## 2016-03-01 NOTE — Progress Notes (Signed)
Patient ID: Sandy [name], female   DOB: [birth year], 70 y.o.   MRN: [id number]   [facility] [place] NAME: Sandy [name]    MR#:  [id number]  DATE OF BIRTH:  1945/05/14  SUBJECTIVE:  CHIEF COMPLAINT:   Chief Complaint  Patient presents with  . Weakness   Patient seen and examined. Chart reviewed and care assumed. She is a 70 year old female admitted on 03/01/2016 with pulmonary embolism on IV heparin. 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 REVIEW OF SYSTEMS:  ROS Constitutional: Negative for chills, fever, malaise/fatigue and weight loss.  HENT: Negative for ear pain, hearing loss and tinnitus.   Eyes: Negative for blurred vision, double vision, pain and redness.  Respiratory: Positive for shortness of breath. Negative for cough and hemoptysis.   Cardiovascular: Positive for chest pain. Negative for palpitations, orthopnea and leg swelling.  Gastrointestinal: Negative for abdominal pain, constipation, diarrhea, nausea and vomiting.  Genitourinary: Negative for dysuria, frequency and hematuria.  Musculoskeletal: Negative for back pain, joint pain and neck pain.  Skin:       No acne, rash, or lesions  Neurological: Positive for weakness. Negative for dizziness, tremors and focal weakness.  Endo/Heme/Allergies: Negative for polydipsia. Does not bruise/bleed easily.  Psychiatric/Behavioral: Negative for depression. The patient is not nervous/anxious and does not have insomnia.      DRUG ALLERGIES:   Allergies  Allergen Reactions  . Tylenol [Acetaminophen] Nausea And Vomiting   VITALS:  Blood pressure 134/66, pulse 69, temperature 98.5 F (36.9 C), temperature source Oral, resp. rate 18, height 5\' 5"  (1.651 m), weight 69.5 kg (153 lb 3 oz), SpO2 100 %. PHYSICAL EXAMINATION:  Physical Exam  Constitutional: She is oriented to person, place, and time. She appears well-developed and  well-nourished. No distress.  HENT:  Head: Normocephalic and atraumatic.  Mouth/Throat: Oropharynx is clear and moist.  Eyes: Conjunctivae and EOM are normal. Pupils are equal, round, and reactive to light. No scleral icterus.  Neck: Normal range of motion. Neck supple. No JVD present. No thyromegaly present.  Cardiovascular: Normal rate, regular rhythm and intact distal pulses.  Exam reveals no gallop and no friction rub.   No murmur heard. Respiratory: Effort normal and breath sounds normal. No respiratory distress. She has no wheezes. She has no rales.  GI: Soft. Bowel sounds are normal. She exhibits no distension. There is no tenderness.  Musculoskeletal: Normal range of motion. She exhibits no edema.  No arthritis, no gout  Lymphadenopathy:    She has no cervical adenopathy.  Neurological: She is alert and oriented to person, place, and time. No cranial nerve deficit.  No dysarthria, no aphasia  Skin: Skin is warm and dry. No rash noted. No erythema.  Psychiatric: She has a normal mood and affect. Her behavior is normal. Judgment and thought content normal.   LABORATORY PANEL:   CBC  Recent Labs Lab 03/01/16 0936  WBC 10.6  HGB 8.6*  HCT 26.1*  PLT 350   ------------------------------------------------------------------------------------------------------------------ Chemistries   Recent Labs Lab 02/29/16 2115 03/01/16 0936  NA 139 139  K 4.5 4.3  CL 110 114*  CO2 21* 19*  GLUCOSE 86 102*  BUN 19 17  CREATININE 1.25* 1.02*  CALCIUM 8.2* 8.0*  MG 1.5*  --   AST 28  --   ALT 13*  --   ALKPHOS 182*  --   BILITOT 0.4  --    RADIOLOGY:  Dg Chest 2 View  Result Date: 02/29/2016 CLINICAL DATA:  Shortness of breath with chest pain. History of colon carcinoma EXAM: CHEST  2 VIEW COMPARISON:  Chest radiograph April 02, 2016 and chest CT April 03, 2016 FINDINGS: There is no edema or consolidation. The heart is upper normal in size with pulmonary vascularity  within normal limits. Port-A-Cath tip is in the right atrium. No pneumothorax. There is aortic atherosclerosis. There is degenerative change in thoracic spine. There is leftward deviation of the upper thoracic trachea. IMPRESSION: No edema or consolidation. No adenopathy. Aortic atherosclerosis. Port-A-Cath tip is in the superior vena cava. Tracheal deviation is due to enlarged right thyroid as noted on recent chest CT. Electronically Signed   By: Lowella Grip III M.D.   On: 02/29/2016 22:01   Ct Head Wo Contrast  Result Date: 02/29/2016 CLINICAL DATA:  Altered mental status.  Colon cancer. EXAM: CT HEAD WITHOUT CONTRAST TECHNIQUE: Contiguous axial images were obtained from the base of the skull through the vertex without intravenous contrast. COMPARISON:  Head CT 02/01/2016 FINDINGS: Brain: No mass lesion, intraparenchymal hemorrhage or extra-axial collection. No evidence of acute cortical infarct. There is periventricular hypoattenuation compatible with chronic microvascular disease. Vascular: No hyperdense vessel or unexpected calcification. Skull: Normal visualized skull base, calvarium and extracranial soft tissues. Sinuses/Orbits: No sinus fluid levels or advanced mucosal thickening. No mastoid effusion. Normal orbits. IMPRESSION: 1. No acute intracranial abnormality.  No mass lesion. 2. Chronic microvascular ischemia. Electronically Signed   By: Ulyses Jarred M.D.   On: 02/29/2016 23:50   Ct Angio Chest Pe W And/or Wo Contrast  Result Date: 02/29/2016 CLINICAL DATA:  Shortness of breath. Delays and weakness. Left-sided chest pain. Stage IV colon cancer with liver metastases. EXAM: CT ANGIOGRAPHY CHEST WITH CONTRAST TECHNIQUE: Multidetector CT imaging of the chest was performed using the standard protocol during bolus administration of intravenous contrast. Multiplanar CT image reconstructions and MIPs were obtained to evaluate the vascular anatomy. CONTRAST:  60 mL Isovue 370 IV COMPARISON:   chest CT 02/02/2016 FINDINGS: Cardiovascular: Contrast injection is sufficient to demonstrate satisfactory opacification of the pulmonary arteries to the segmental level. There is a focal filling defect within the right middle lobe medial segmental pulmonary artery (series 9, image 41). No other pulmonary embolus is identified. The main pulmonary artery is mildly dilated, measuring 3.2 cm at the bifurcation. There is no CT evidence of acute right heart strain. There is atherosclerotic calcification of the aorta. There is a normal 3-vessel arch branching pattern. Heart size is normal, without pericardial effusion. There is a left chest wall Port-A-Cath with its tip at the cavoatrial junction. Mediastinum/Nodes: The thyroid gland is enlarged, particularly the right lobe, with a predominantly hypodense nodule measuring 2.8 x 3 cm. The visualized course of the thoracic esophagus is normal. No mediastinal or hilar lymphadenopathy. No axillary adenopathy. Lungs/Pleura: No pulmonary nodules or masses. No pleural effusion or pneumothorax. No focal airspace consolidation. No focal pleural abnormality. Upper Abdomen: Contrast bolus timing is not optimized for evaluation of the abdominal organs. There are multiple hyper enhancing lesions within the liver, which are incompletely visualized. The lesion in the superior caudate, for comparison, is unchanged in size measuring 1.8 x 1.4 cm. Musculoskeletal: No chest wall abnormality. No acute or significant osseous findings. Review of the MIP images confirms the above findings. IMPRESSION: 1. Small pulmonary embolus within the right middle lobe medial segmental pulmonary artery. No evidence of right heart strain. 2. Incomplete visualization of metastatic disease within the  liver, which appears unchanged compared to the 02/02/2016 CT. 3. Aortic atherosclerosis. 4. Mild dilatation of the main pulmonary artery may indicate underlying pulmonary hypertension. 5. No evidence of pulmonary  metastatic disease. 6. Heterogeneous nodule within the right thyroid gland. This has reportedly been previously biopsied. Critical Value/emergent results were called by telephone at the time of interpretation on 02/29/2016 at 11:46 pm to Dr. Merlyn Lot , who verbally acknowledged these results. Electronically Signed   By: Ulyses Jarred M.D.   On: 02/29/2016 23:47   ASSESSMENT AND PLAN:   Principal Problem:   PE (pulmonary thromboembolism) (HCC) - IV heparin drip started. Echocardiogram done and is pending. Active Problems:   Colon cancer metastasized to liver [facility]) - continue home meds for cancer related pain. Patient is currently being followed by oncology in the outpatient setting.   Benign essential HTN - stable, continue home meds   Type 2 diabetes mellitus (HCC) - sliding scale insulin with corresponding glucose checks and carb modified diet  All the records are reviewed and case discussed with Care Management/Social Worker. Management plans discussed with the patient, family and they are in agreement.  CODE STATUS: Full  TOTAL TIME TAKING CARE OF THIS PATIENT: 20 minutes.   More than 50% of the time was spent in counseling/coordination of care: YES  POSSIBLE D/C IN 1-2 DAYS, DEPENDING ON CLINICAL CONDITION.   Franke Menter D.O. on 03/01/2016 at 12:25 PM  Between 7am to 6pm - Pager - [phone]  After 6pm go to [email or web address] - Proofreader  [facility]  Office  [phone]  CC: Primary care physician; [name], MD  Note: This dictation was prepared with Dragon dictation along with smaller phrase technology. Any transcriptional errors that result from this process are unintentional.

## 2016-03-01 NOTE — ED Notes (Signed)
Called pharmacy to request medication 

## 2016-03-01 NOTE — Care Management (Signed)
Patient presented to the ED with shortness of breath and chest pain.  She is admitted with pulmonary embolus and is on heparin drip.  She is followed by [facility] health and has had a recent discharge 10/23.  She is being followed by [facility] oncology for metastatic colon cancer. last discharge she required ems transport home.  Updated [facility] liaison of admission via text.

## 2016-03-01 NOTE — Progress Notes (Signed)
ANTICOAGULATION CONSULT NOTE - FOLLOW UP   Pharmacy Consult for heparin drip Indication: PE  Allergies  Allergen Reactions  . Tylenol [Acetaminophen] Nausea And Vomiting    Patient Measurements: Height: 5\' 5"  (165.1 cm) Weight: 153 lb 3 oz (69.5 kg) IBW/kg (Calculated) : 57 Heparin Dosing Weight: 70.3kg  Vital Signs: Temp: 98.5 F (36.9 C) (11/19 0332) Temp Source: Oral (11/19 0332) BP: 134/66 (11/19 0908) Pulse Rate: 69 (11/19 0908)  Labs:  Recent Labs  02/29/16 2115 03/01/16 0936  HGB 9.7* 8.6*  HCT 29.7* 26.1*  PLT 405 350  APTT 39*  --   LABPROT 16.0*  --   INR 1.27  --   HEPARINUNFRC  --  0.79*  CREATININE 1.25* 1.02*  TROPONINI <0.03  --     Estimated Creatinine Clearance: 48.8 mL/min (by C-G formula based on SCr of 1.02 mg/dL (H)).   Medical History: Past Medical History:  Diagnosis Date  . Cancer ([place])    liver mets; colon cancer  . Constipation   . Diabetes mellitus without complication ([place])   . HOH (hard of hearing)   . Hypertension   . Insomnia   . 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 Wears dentures    partial upper and lower    Medications:  No anticoag in PTA meds  Assessment:  Goal of Therapy:  INR 2-3 Monitor platelets by anticoagulation protocol: Yes   Plan:  Heparin level resulted @ 0.79. Will reduce heparin gtt to 1100 units/hr. Will recheck heparin level @ 1900.      Ishi Danser D 03/01/2016,10:47 AM

## 2016-03-01 NOTE — ED Notes (Signed)
Patient given food tray and sprite  

## 2016-03-01 NOTE — Progress Notes (Signed)
ANTICOAGULATION CONSULT NOTE - Initial Consult  Pharmacy Consult for heparin drip Indication: PE  Allergies  Allergen Reactions  . Tylenol [Acetaminophen] Nausea And Vomiting    Patient Measurements: Height: 5\' 5"  (165.1 cm) Weight: 155 lb (70.3 kg) IBW/kg (Calculated) : 57 Heparin Dosing Weight: 70.3kg  Vital Signs: Temp: 98.2 F (36.8 C) (11/18 2107) Temp Source: Oral (11/18 2107) BP: 127/84 (11/19 0000) Pulse Rate: 76 (11/19 0000)  Labs:  Recent Labs  02/29/16 2115  HGB 9.7*  HCT 29.7*  PLT 405  APTT 39*  LABPROT 16.0*  INR 1.27  CREATININE 1.25*  TROPONINI <0.03    Estimated Creatinine Clearance: 40 mL/min (by C-G formula based on SCr of 1.25 mg/dL (H)).   Medical History: Past Medical History:  Diagnosis Date  . Cancer ([place])    liver mets; colon cancer  . Constipation   . Diabetes mellitus without complication ([place])   . HOH (hard of hearing)   . Hypertension   . Insomnia   . 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 Wears dentures    partial upper and lower    Medications:  No anticoag in PTA meds  Assessment:  Goal of Therapy:  INR 2-3 Monitor platelets by anticoagulation protocol: Yes   Plan:  4200 unit bolus an initial rate of 1200 units/hr. First heparin level 8 hours after start of infusion.   Sandy [name] S 03/01/2016,12:36 AM

## 2016-03-01 NOTE — Progress Notes (Addendum)
ANTICOAGULATION CONSULT NOTE - FOLLOW UP   Pharmacy Consult for heparin drip Indication: PE  Allergies  Allergen Reactions  . Tylenol [Acetaminophen] Nausea And Vomiting    Patient Measurements: Height: 5\' 5"  (165.1 cm) Weight: 153 lb 3 oz (69.5 kg) IBW/kg (Calculated) : 57 Heparin Dosing Weight: 70.3kg  Vital Signs: Temp: 97.8 F (36.6 C) (11/19 1739) Temp Source: Oral (11/19 1739) BP: 125/59 (11/19 1739) Pulse Rate: 68 (11/19 1739)  Labs:  Recent Labs  02/29/16 2115 03/01/16 0936 03/01/16 1914  HGB 9.7* 8.6*  --   HCT 29.7* 26.1*  --   PLT 405 350  --   APTT 39*  --   --   LABPROT 16.0*  --   --   INR 1.27  --   --   HEPARINUNFRC  --  0.79* 0.73*  CREATININE 1.25* 1.02*  --   TROPONINI <0.03  --   --     Estimated Creatinine Clearance: 48.8 mL/min (by C-G formula based on SCr of 1.02 mg/dL (H)).   Medical History: Past Medical History:  Diagnosis Date  . Cancer ([place])    liver mets; colon cancer  . Constipation   . Diabetes mellitus without complication ([place])   . HOH (hard of hearing)   . Hypertension   . Insomnia   . 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 Wears dentures    partial upper and lower    Medications:  No anticoag in PTA meds  Assessment:  Goal of Therapy:  INR 2-3 Monitor platelets by anticoagulation protocol: Yes   Plan:  Heparin level resulted @ 0.79. Will reduce heparin gtt to 1100 units/hr. Will recheck heparin level @ 1900.   11/19:  HL @ 19:14 = 0.73.   Will decrease rate to 1000 units/hr and recheck HL 8 hrs after rate change.   11/20 04:30 heparin level 0.74. Decrease to 900 units/hr and recheck in 8 hours.  Robbins,Jason D 03/01/2016,7:59 PM

## 2016-03-01 NOTE — ED Notes (Signed)
Patient has decubitus ulcer on sacrum. Family aware. RN placed pressure relieving bandage on sacrum. Patient placed on side.

## 2016-03-01 NOTE — ED Notes (Signed)
MD Robinson at bedside 

## 2016-03-01 NOTE — Progress Notes (Signed)
Patient spent day in bed. Very sleepy this morning, but more awake this afternoon. Ate small amounts of lunch and dinner. Heparin infusing per protocol. Patient turned from side to side and kept clean and comfortable all day. Patient not very communicative - but denied pain all day. Family at bedside this afternoon. FSBS low normal today - encouraged to eat and drink.

## 2016-03-01 NOTE — H&P (Signed)
[facility] NAME: Sandy [name]    MR#:  [id number]  DATE OF BIRTH:  1945-11-06  DATE OF ADMISSION:  02/29/2016  PRIMARY CARE PHYSICIAN: Harriett Neita Carp, MD   REQUESTING/REFERRING PHYSICIAN: Quentin Cornwall, M.D.  CHIEF COMPLAINT:   Chief Complaint  Patient presents with  . Weakness    HISTORY OF PRESENT ILLNESS:  Sandy [name]  is a 70 y.o. female who presents with Weakness and chest discomfort. Patient developed chest discomfort and shortness of breath at home. The symptoms occurred within the last 24 hours and were getting progressively worse. Here in the ED she was found to have a small PE. No right heart strain noted on CT scan. Patient's blood pressure and heart rate are stable and within normal limits. Her oxygen level is good on room air. However, given her persistent chest discomfort and overall weakness hospitals were called for admission.  PAST MEDICAL HISTORY:   Past Medical History:  Diagnosis Date  . Cancer ([place])    liver mets; colon cancer  . Constipation   . Diabetes mellitus without complication ([place])   . HOH (hard of hearing)   . Hypertension   . Insomnia   . 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 Wears dentures    partial upper and lower    PAST SURGICAL HISTORY:   Past Surgical History:  Procedure Laterality Date  . CARDIAC CATHETERIZATION    . COLONOSCOPY WITH PROPOFOL N/A 09/07/2014   Procedure: COLONOSCOPY WITH PROPOFOL;  Surgeon: Lucilla Lame, MD;  Location: [facility] ENDOSCOPY;  Service: Endoscopy;  Laterality: N/A;  . ESOPHAGOGASTRODUODENOSCOPY N/A 09/07/2014   Procedure: ESOPHAGOGASTRODUODENOSCOPY (EGD);  Surgeon: Lucilla Lame, MD;  Location[facility] ENDOSCOPY;  Service: Endoscopy;  Laterality: N/A;  . FLEXIBLE SIGMOIDOSCOPY N/A 09/02/2015   Procedure: FLEXIBLE SIGMOIDOSCOPY;  Surgeon: Lucilla Lame, MD;  Location: [place];  Service: Endoscopy;  Laterality: N/A;   Diabetic - oral meds Pt has port-a-cath  . LIVER BIOPSY    . PORTACATH PLACEMENT Left 09/24/2014   Procedure: INSERTION PORT-A-CATH;  Surgeon: Robert Bellow, MD;  Location: [facility] ORS;  Service: General;  Laterality: Left;    SOCIAL HISTORY:   Social History  Substance Use Topics  . Smoking status: Former Smoker    Packs/day: 0.25    Years: 20.00    Types: Cigarettes    Quit date: 09/06/2013  . Smokeless tobacco: Never Used  . Alcohol use No    FAMILY HISTORY:   Family History  Problem Relation Age of Onset  . Cancer Sister     DRUG ALLERGIES:   Allergies  Allergen Reactions  . Tylenol [Acetaminophen] Nausea And Vomiting    MEDICATIONS AT HOME:   Prior to Admission medications   Medication Sig Start Date End Date Taking? Authorizing Provider  amiodarone (PACERONE) 400 MG tablet Take 0.5 tablets (200 mg total) by mouth 2 (two) times daily. Patient taking differently: Take 400 mg by mouth daily.  01/05/16  Yes Dustin Flock, MD  aspirin EC 81 MG tablet Take 81 mg by mouth daily. Reported on 08/26/2015   Yes Historical Provider, MD  atorvastatin (LIPITOR) 40 MG tablet Take 40 mg by mouth daily.   Yes Historical Provider, MD  gabapentin (NEURONTIN) 100 MG capsule Take 2 capsules (200 mg total) by mouth at bedtime. 1 tab daily and 2 tabs at bedtime. Patient taking differently: Take 200 mg by mouth at bedtime.  07/16/15  Yes Lequita Asal, MD  lisinopril (PRINIVIL,ZESTRIL) 10 MG tablet Take 10 mg by mouth daily.   Yes Historical Provider, MD  magnesium 30 MG tablet Take 30 mg by mouth daily.    Yes Historical Provider, MD  megestrol (MEGACE) 400 MG/10ML suspension Take 5 mLs (200 mg total) by mouth daily. to stimulate appetite Patient taking differently: Take 200 mg by mouth 2 (two) times daily. to stimulate appetite 11/06/14  Yes Lequita Asal, MD  metFORMIN (GLUCOPHAGE) 1000 MG tablet Take 1,000 mg by mouth 2 (two) times daily.    Yes Historical Provider, MD   Multiple Vitamin (MULTIVITAMIN WITH MINERALS) TABS tablet Take 1 tablet by mouth daily.   Yes Historical Provider, MD  ondansetron (ZOFRAN) 4 MG tablet Take 1 tablet (4 mg total) by mouth every 8 (eight) hours as needed for nausea or vomiting. 12/19/15  Yes Lequita Asal, MD  oxyCODONE (OXY IR/ROXICODONE) 5 MG immediate release tablet Take 1 tablet (5 mg total) by mouth every 6 (six) hours as needed for severe pain. 02/20/16  Yes Lequita Asal, MD  regorafenib (STIVARGA) 40 MG tablet Take with low fat meal. Caution: Chemotherapy. Take for 21 days and then 7 days off 02/17/16  Yes Lequita Asal, MD    REVIEW OF SYSTEMS:  Review of Systems  Constitutional: Negative for chills, fever, malaise/fatigue and weight loss.  HENT: Negative for ear pain, hearing loss and tinnitus.   Eyes: Negative for blurred vision, double vision, pain and redness.  Respiratory: Positive for shortness of breath. Negative for cough and hemoptysis.   Cardiovascular: Positive for chest pain. Negative for palpitations, orthopnea and leg swelling.  Gastrointestinal: Negative for abdominal pain, constipation, diarrhea, nausea and vomiting.  Genitourinary: Negative for dysuria, frequency and hematuria.  Musculoskeletal: Negative for back pain, joint pain and neck pain.  Skin:       No acne, rash, or lesions  Neurological: Positive for weakness. Negative for dizziness, tremors and focal weakness.  Endo/Heme/Allergies: Negative for polydipsia. Does not bruise/bleed easily.  Psychiatric/Behavioral: Negative for depression. The patient is not nervous/anxious and does not have insomnia.      VITAL SIGNS:   Vitals:   02/29/16 2200 02/29/16 2230 02/29/16 2300 03/01/16 0000  BP:   127/83 127/84  Pulse: 80 93 82 76  Resp: 12 10 14 13   Temp:      TempSrc:      SpO2: 100% 100% 100% 100%  Weight:      Height:       Wt Readings from Last 3 Encounters:  02/29/16 70.3 kg (155 lb)  02/11/16 68.5 kg (151 lb 1 oz)   02/01/16 67.2 kg (148 lb 2.4 oz)    PHYSICAL EXAMINATION:  Physical Exam  Vitals reviewed. Constitutional: She is oriented to person, place, and time. She appears well-developed and well-nourished. No distress.  HENT:  Head: Normocephalic and atraumatic.  Mouth/Throat: Oropharynx is clear and moist.  Eyes: Conjunctivae and EOM are normal. Pupils are equal, round, and reactive to light. No scleral icterus.  Neck: Normal range of motion. Neck supple. No JVD present. No thyromegaly present.  Cardiovascular: Normal rate, regular rhythm and intact distal pulses.  Exam reveals no gallop and no friction rub.   No murmur heard. Respiratory: Effort normal and breath sounds normal. No respiratory distress. She has no wheezes. She has no rales.  GI: Soft. Bowel sounds are normal. She exhibits no distension. There is no tenderness.  Musculoskeletal: Normal range of motion. She  exhibits no edema.  No arthritis, no gout  Lymphadenopathy:    She has no cervical adenopathy.  Neurological: She is alert and oriented to person, place, and time. No cranial nerve deficit.  No dysarthria, no aphasia  Skin: Skin is warm and dry. No rash noted. No erythema.  Psychiatric: She has a normal mood and affect. Her behavior is normal. Judgment and thought content normal.    LABORATORY PANEL:   CBC  Recent Labs Lab 02/29/16 2115  WBC 13.9*  HGB 9.7*  HCT 29.7*  PLT 405   ------------------------------------------------------------------------------------------------------------------  Chemistries   Recent Labs Lab 02/29/16 2115  NA 139  K 4.5  CL 110  CO2 21*  GLUCOSE 86  BUN 19  CREATININE 1.25*  CALCIUM 8.2*  MG 1.5*  AST 28  ALT 13*  ALKPHOS 182*  BILITOT 0.4   ------------------------------------------------------------------------------------------------------------------  Cardiac Enzymes  Recent Labs Lab 02/29/16 2115  TROPONINI <0.03    ------------------------------------------------------------------------------------------------------------------  RADIOLOGY:  Dg Chest 2 View  Result Date: 02/29/2016 CLINICAL DATA:  Shortness of breath with chest pain. History of colon carcinoma EXAM: CHEST  2 VIEW COMPARISON:  Chest radiograph April 02, 2016 and chest CT April 03, 2016 FINDINGS: There is no edema or consolidation. The heart is upper normal in size with pulmonary vascularity within normal limits. Port-A-Cath tip is in the right atrium. No pneumothorax. There is aortic atherosclerosis. There is degenerative change in thoracic spine. There is leftward deviation of the upper thoracic trachea. IMPRESSION: No edema or consolidation. No adenopathy. Aortic atherosclerosis. Port-A-Cath tip is in the superior vena cava. Tracheal deviation is due to enlarged right thyroid as noted on recent chest CT. Electronically Signed   By: Lowella Grip III M.D.   On: 02/29/2016 22:01   Ct Head Wo Contrast  Result Date: 02/29/2016 CLINICAL DATA:  Altered mental status.  Colon cancer. EXAM: CT HEAD WITHOUT CONTRAST TECHNIQUE: Contiguous axial images were obtained from the base of the skull through the vertex without intravenous contrast. COMPARISON:  Head CT 02/01/2016 FINDINGS: Brain: No mass lesion, intraparenchymal hemorrhage or extra-axial collection. No evidence of acute cortical infarct. There is periventricular hypoattenuation compatible with chronic microvascular disease. Vascular: No hyperdense vessel or unexpected calcification. Skull: Normal visualized skull base, calvarium and extracranial soft tissues. Sinuses/Orbits: No sinus fluid levels or advanced mucosal thickening. No mastoid effusion. Normal orbits. IMPRESSION: 1. No acute intracranial abnormality.  No mass lesion. 2. Chronic microvascular ischemia. Electronically Signed   By: Ulyses Jarred M.D.   On: 02/29/2016 23:50   Ct Angio Chest Pe W And/or Wo Contrast  Result  Date: 02/29/2016 CLINICAL DATA:  Shortness of breath. Delays and weakness. Left-sided chest pain. Stage IV colon cancer with liver metastases. EXAM: CT ANGIOGRAPHY CHEST WITH CONTRAST TECHNIQUE: Multidetector CT imaging of the chest was performed using the standard protocol during bolus administration of intravenous contrast. Multiplanar CT image reconstructions and MIPs were obtained to evaluate the vascular anatomy. CONTRAST:  60 mL Isovue 370 IV COMPARISON:  chest CT 02/02/2016 FINDINGS: Cardiovascular: Contrast injection is sufficient to demonstrate satisfactory opacification of the pulmonary arteries to the segmental level. There is a focal filling defect within the right middle lobe medial segmental pulmonary artery (series 9, image 41). No other pulmonary embolus is identified. The main pulmonary artery is mildly dilated, measuring 3.2 cm at the bifurcation. There is no CT evidence of acute right heart strain. There is atherosclerotic calcification of the aorta. There is a normal 3-vessel arch branching pattern. Heart size  is normal, without pericardial effusion. There is a left chest wall Port-A-Cath with its tip at the cavoatrial junction. Mediastinum/Nodes: The thyroid gland is enlarged, particularly the right lobe, with a predominantly hypodense nodule measuring 2.8 x 3 cm. The visualized course of the thoracic esophagus is normal. No mediastinal or hilar lymphadenopathy. No axillary adenopathy. Lungs/Pleura: No pulmonary nodules or masses. No pleural effusion or pneumothorax. No focal airspace consolidation. No focal pleural abnormality. Upper Abdomen: Contrast bolus timing is not optimized for evaluation of the abdominal organs. There are multiple hyper enhancing lesions within the liver, which are incompletely visualized. The lesion in the superior caudate, for comparison, is unchanged in size measuring 1.8 x 1.4 cm. Musculoskeletal: No chest wall abnormality. No acute or significant osseous findings.  Review of the MIP images confirms the above findings. IMPRESSION: 1. Small pulmonary embolus within the right middle lobe medial segmental pulmonary artery. No evidence of right heart strain. 2. Incomplete visualization of metastatic disease within the liver, which appears unchanged compared to the 02/02/2016 CT. 3. Aortic atherosclerosis. 4. Mild dilatation of the main pulmonary artery may indicate underlying pulmonary hypertension. 5. No evidence of pulmonary metastatic disease. 6. Heterogeneous nodule within the right thyroid gland. This has reportedly been previously biopsied. Critical Value/emergent results were called by telephone at the time of interpretation on 02/29/2016 at 11:46 pm to Dr. Merlyn Lot , who verbally acknowledged these results. Electronically Signed   By: Ulyses Jarred M.D.   On: 02/29/2016 23:47    EKG:   Orders placed or performed during the hospital encounter of 02/29/16  . ED EKG  . ED EKG  . EKG 12-Lead  . EKG 12-Lead    IMPRESSION AND PLAN:  Principal Problem:   PE (pulmonary thromboembolism) (HCC) - IV heparin drip started. Echocardiogram ordered for the morning. Active Problems:   Colon cancer metastasized to liver [facility]) - continue home meds for cancer related pain. Patient is currently being followed by oncology in the outpatient setting.   Benign essential HTN - stable, continue home meds   Type 2 diabetes mellitus (HCC) - sliding scale insulin with corresponding glucose checks and carb modified diet  All the records are reviewed and case discussed with ED provider. Management plans discussed with the patient and/or family.  DVT PROPHYLAXIS: Systemic anticoagulation  GI PROPHYLAXIS: None  ADMISSION STATUS: Observation  CODE STATUS: Full Code Status History    Date Active Date Inactive Code Status Order ID Comments User Context   02/01/2016  6:43 PM 02/03/2016  7:33 PM Full Code [id number]  Lytle Butte, MD ED   12/31/2015 10:59 PM 01/05/2016   6:05 PM Full Code [id number]  Theodoro Grist, MD Inpatient   11/11/2015  8:03 AM 11/12/2015  8:20 PM DNR [id number]  Hillary Bow, MD Inpatient   11/08/2015  2:54 PM 11/08/2015  5:44 PM DNR [id number]  Awilda Bill, NP Inpatient   11/07/2015  6:17 PM 11/08/2015  2:54 PM Full Code [id number]  Vaughan Basta, MD Inpatient   08/28/2014  4:37 PM 08/29/2014  8:42 PM Full Code [id number]  Theodoro Grist, MD Inpatient    Advance Directive Documentation   Flowsheet Row Most Recent Value  Type of Advance Directive  Healthcare Power of Attorney  Pre-existing out of facility DNR order (yellow form or pink MOST form)  No data  "MOST" Form in Place?  No data      TOTAL TIME TAKING CARE OF THIS PATIENT: 40 minutes.    Nastasha Reising FIELDING 03/01/2016,  1:58 AM  [facility]  Office  [phone]  CC: Primary care physician; [name], MD

## 2016-03-02 DIAGNOSIS — I10 Essential (primary) hypertension: Secondary | ICD-10-CM

## 2016-03-02 DIAGNOSIS — G4700 Insomnia, unspecified: Secondary | ICD-10-CM

## 2016-03-02 DIAGNOSIS — Z9221 Personal history of antineoplastic chemotherapy: Secondary | ICD-10-CM

## 2016-03-02 DIAGNOSIS — R451 Restlessness and agitation: Secondary | ICD-10-CM

## 2016-03-02 DIAGNOSIS — G629 Polyneuropathy, unspecified: Secondary | ICD-10-CM

## 2016-03-02 DIAGNOSIS — Z7901 Long term (current) use of anticoagulants: Secondary | ICD-10-CM

## 2016-03-02 DIAGNOSIS — Z7982 Long term (current) use of aspirin: Secondary | ICD-10-CM

## 2016-03-02 DIAGNOSIS — Z79899 Other long term (current) drug therapy: Secondary | ICD-10-CM

## 2016-03-02 DIAGNOSIS — E119 Type 2 diabetes mellitus without complications: Secondary | ICD-10-CM

## 2016-03-02 DIAGNOSIS — C787 Secondary malignant neoplasm of liver and intrahepatic bile duct: Secondary | ICD-10-CM

## 2016-03-02 DIAGNOSIS — Z87891 Personal history of nicotine dependence: Secondary | ICD-10-CM

## 2016-03-02 DIAGNOSIS — I2699 Other pulmonary embolism without acute cor pulmonale: Principal | ICD-10-CM

## 2016-03-02 DIAGNOSIS — C189 Malignant neoplasm of colon, unspecified: Secondary | ICD-10-CM

## 2016-03-02 DIAGNOSIS — K5900 Constipation, unspecified: Secondary | ICD-10-CM

## 2016-03-02 LAB — BASIC METABOLIC PANEL
Anion gap: 5 (ref 5–15)
BUN: 14 mg/dL (ref 6–20)
CALCIUM: 8.5 mg/dL — AB (ref 8.9–10.3)
CO2: 23 mmol/L (ref 22–32)
CREATININE: 0.93 mg/dL (ref 0.44–1.00)
Chloride: 114 mmol/L — ABNORMAL HIGH (ref 101–111)
GFR calc Af Amer: >60 mL/min (ref >60)
GFR, EST NON AFRICAN AMERICAN: 60 mL/min — AB (ref >60)
GLUCOSE: 78 mg/dL (ref 65–99)
Potassium: 4.3 mmol/L (ref 3.5–5.1)
SODIUM: 142 mmol/L (ref 135–145)

## 2016-03-02 LAB — CBC
HCT: 31 % — ABNORMAL LOW (ref 35.0–47.0)
Hemoglobin: 10.2 g/dL — ABNORMAL LOW (ref 12.0–16.0)
MCH: 29.5 pg (ref 26.0–34.0)
MCHC: 33.1 g/dL (ref 32.0–36.0)
MCV: 89.2 fL (ref 80.0–100.0)
PLATELETS: 413 10*3/uL (ref 150–440)
RBC: 3.47 MIL/uL — ABNORMAL LOW (ref 3.80–5.20)
RDW: 18.2 % — AB (ref 11.5–14.5)
WBC: 14.2 10*3/uL — ABNORMAL HIGH (ref 3.6–11.0)

## 2016-03-02 LAB — HEPARIN LEVEL (UNFRACTIONATED): HEPARIN UNFRACTIONATED: 0.74 [IU]/mL — AB (ref 0.30–0.70)

## 2016-03-02 LAB — GLUCOSE, CAPILLARY
GLUCOSE-CAPILLARY: 100 mg/dL — AB (ref 65–99)
GLUCOSE-CAPILLARY: 80 mg/dL (ref 65–99)
Glucose-Capillary: 115 mg/dL — ABNORMAL HIGH (ref 65–99)
Glucose-Capillary: 73 mg/dL (ref 65–99)

## 2016-03-02 MED ORDER — ENOXAPARIN SODIUM 80 MG/0.8ML ~~LOC~~ SOLN: 1.0000 mg/kg | Freq: Two times a day (BID) | SUBCUTANEOUS | Status: DC

## 2016-03-02 MED ORDER — LORAZEPAM 0.5 MG PO TABS
0.5000 mg | ORAL_TABLET | ORAL | Status: DC | PRN
  Administered 2016-03-02 – 2016-03-03 (×4): 0.5 mg via ORAL
  Filled 2016-03-02 (×4): qty 1

## 2016-03-02 MED ORDER — SODIUM CHLORIDE 0.9 % IV SOLN
INTRAVENOUS | Status: AC
  Administered 2016-03-02 – 2016-03-03 (×2): via INTRAVENOUS

## 2016-03-02 MED ORDER — ENOXAPARIN SODIUM 80 MG/0.8ML ~~LOC~~ SOLN
1.0000 mg/kg | Freq: Two times a day (BID) | SUBCUTANEOUS | Status: DC
  Administered 2016-03-02 – 2016-03-03 (×3): 70 mg via SUBCUTANEOUS
  Filled 2016-03-02 (×3): qty 0.8

## 2016-03-02 MED ORDER — LORAZEPAM 1 MG PO TABS
1.0000 mg | ORAL_TABLET | Freq: Four times a day (QID) | ORAL | Status: DC | PRN
  Administered 2016-03-02: 08:00:00 1 mg via ORAL
  Filled 2016-03-02: qty 1

## 2016-03-02 NOTE — Progress Notes (Addendum)
Visit made. Patient is currently followed by [facility] health. She has a diagnosis of colon caner and is a FULL CODE. She was admitted on 11/18 with evaluation of chest pain and dyspnea, found to have a PE.  Patient seen sitting up in the recliner, daughter at bedside, patient appears restless, wanting to stand, moaning and calling out. She has received lorazepam which was effective earlier in the morning. Current order is for q 6 hrs PRN, staff RN Stanton Kidney to contact attending physician, with [name]'s permission to change frequency to q 4 hrs.  Palliative Medicine has been consulted. Patient appears to be having terminal restlessness, [name] is not open to any conversation regarding her mother's condition. Will continue to follow and update [facility] staff. Thank you. Flo Shanks Rn, BSN, [place], hospital liaison [phone] c

## 2016-03-02 NOTE — Progress Notes (Signed)
  This NP introduced self to daughter [name] at bedside and offered Palliative services.    She adamantly declined.    Respectfully left card and encouraged her to call with questions or concerns.  Will shadow chart for needs, otherwise call if we can be of assistance in the future.  Wadie Lessen NP  Palliative Medicine Team Team Phone # [phone] Pager [phone]  No charge

## 2016-03-02 NOTE — Care Management Important Message (Signed)
Important Message  Patient Details  Name: Sandy [name] MRN: [id number] Date of Birth: 10-03-1945   Medicare Important Message Given:  Yes    Shelbie Ammons, RN 03/02/2016, 9:07 AM

## 2016-03-02 NOTE — Care Management (Signed)
Admitted to [facility] with the diagnosis of pulmonary thromboembolism. Discharged from this facility 02/03/16. Lives with daughter, [name] for over a year [phone]). Sees Dr. Kingsley Spittle at [facility]. [facility] in the past. Followed by [facility] now. Prescriptions are filled at [facility]. No falls. Daughter helps with activities of daily living. Transported home per [facility] last admission. Daughter is not sure about transportation at this time. Shelbie Ammons RN MSN CCM Care Management

## 2016-03-02 NOTE — Progress Notes (Signed)
Heparin gtt reduced to 9 ml/hr per pharmacy dosing.

## 2016-03-02 NOTE — Progress Notes (Signed)
Patient's daughter is at bedside. Patient is resting with eyes closed. She has requested that scheduled vital signs not be done at this time.

## 2016-03-02 NOTE — Progress Notes (Addendum)
Pt's daughter is at the bedside. CNA in to check FSBS, daughter is upset that patient has not rested this shift and stated that FSBS was just done. Last FSBS was @  0552 and was 115. Day shift RN aware that daughter requesting that this mornings blood sugar not be repeated at this time.

## 2016-03-02 NOTE — Progress Notes (Signed)
Patient's daughter notified that patient continues to yell but denies pain or discomfort. Daughter states that she is on her way in to the hospital. Charge RN in to sit with patient until daughter arrives.

## 2016-03-02 NOTE — Progress Notes (Signed)
Patient ID: Sandy [name], female   DOB: Oct 23, 1945, 70 y.o.   MRN: [id number]   [facility] [place] NAME: Sandy [name]    MR#:  [id number]  DATE OF BIRTH:  10-Jun-1945  SUBJECTIVE:  CHIEF COMPLAINT:   Chief Complaint  Patient presents with  . Weakness    Patient agitated earlier and received oral Ativan. Now drowsy sitting in chair. Daughter at bedside.  On heparin drip.  REVIEW OF SYSTEMS:  ROS  Unable to obtain due to being drowsy    DRUG ALLERGIES:   Allergies  Allergen Reactions  . Tylenol [Acetaminophen] Nausea And Vomiting   VITALS:  Blood pressure (!) 163/93, pulse 86, temperature 97.6 F (36.4 C), temperature source Oral, resp. rate 20, height 5\' 5"  (1.651 m), weight 69.5 kg (153 lb 3 oz), SpO2 100 %. PHYSICAL EXAMINATION:  Physical Exam  Constitutional: She is oriented to person, place, and time. She appears well-developed and well-nourished. No distress.  HENT:  Head: Normocephalic and atraumatic.  Mouth/Throat: Oropharynx is clear and moist.  Eyes: Conjunctivae and EOM are normal. Pupils are equal, round, and reactive to light. No scleral icterus.  Neck: Normal range of motion. Neck supple. No JVD present. No thyromegaly present.  Cardiovascular: Normal rate, regular rhythm and intact distal pulses.  Exam reveals no gallop and no friction rub.   No murmur heard. Respiratory: Effort normal and breath sounds normal. No respiratory distress. She has no wheezes. She has no rales.  GI: Soft. Bowel sounds are normal. She exhibits no distension. There is no tenderness.  Musculoskeletal: Normal range of motion. She exhibits no edema.  Lymphadenopathy:    She has no cervical adenopathy.  Neurological: She is drowsy Skin: Skin is warm and dry. No rash noted. No erythema.   LABORATORY PANEL:   CBC  Recent Labs Lab 03/02/16 0430  WBC 14.2*  HGB 10.2*  HCT 31.0*  PLT 413    ------------------------------------------------------------------------------------------------------------------ Chemistries   Recent Labs Lab 02/29/16 2115  03/02/16 0430  NA 139  < > 142  K 4.5  < > 4.3  CL 110  < > 114*  CO2 21*  < > 23  GLUCOSE 86  < > 78  BUN 19  < > 14  CREATININE 1.25*  < > 0.93  CALCIUM 8.2*  < > 8.5*  MG 1.5*  --   --   AST 28  --   --   ALT 13*  --   --   ALKPHOS 182*  --   --   BILITOT 0.4  --   --   < > = values in this interval not displayed. RADIOLOGY:  No results found. ASSESSMENT AND PLAN:      PE (pulmonary thromboembolism) (HCC) We'll change IV heparin to Lovenox. Will likely need Lovenox at discharge. Consult oncology.    Colon cancer metastasized to liver [facility]) Oncology input awaited. Daughter mentions that she will not do any further chemotherapy. Palliative care consulted.    Benign essential HTN - stable, continue home meds    Type 2 diabetes mellitus (HCC) - sliding scale insulin with corresponding glucose checks and carb modified diet  All the records are reviewed and case discussed with Care Management/Social Worker. Management plans discussed with the patient, family and they are in agreement.  CODE STATUS: Full  TOTAL TIME TAKING CARE OF THIS PATIENT: 30 minutes.   POSSIBLE D/C IN 1-2 DAYS, DEPENDING ON CLINICAL CONDITION.  Hillary Bow R M.D. on 03/02/2016 at 11:42 AM  Between 7am to 6pm - Pager - [phone]  After 6pm go to [email or web address] - Proofreader  [facility]  Office  [phone]  CC: Primary care physician; [name], MD  Note: This dictation was prepared with Dragon dictation along with smaller phrase technology. Any transcriptional errors that result from this process are unintentional.

## 2016-03-02 NOTE — Consult Note (Signed)
[facility]  Date of admission:  02/29/2016  Inpatient day:  03/02/2016  Consulting physician:  Dr. Hillary Bow  Reason for Consultation:  Pulmonary embolism colon cancer  Chief Complaint: Sandy [name] is a 70 y.o. female with metastatic colon cancer who was admitted with a pulmonary embolism.  HPI:  The patient was diagnosed with metastatic colon cancer in 08/2014.  She has received FOLFOX chemotherapy, 5-fluorouracil and leucovorin, and FOLFIRI chemotherapy.  FOLFIRI chemotherapy complicated by toxicity (admissions for fever and neutropenia as well as atrial fibrillation ).  Scans on 02/02/2016 documented progressive disease.  Decision was made to proceed with Stivarga (Rx approval recently obtained).  Patient was in her usual state of fair health, when she developed acute chest pain and shortness of breath.  Chest CT angiogram revealed a small pulmonary embolus within the right middle lobe medial segmental pulmonary artery. There was no evidence of right heart strain.  She was started on Lovenox.  Since admission, she has been restless.  This morning she was agitated and screaming.  She is currently sleeping.   Past Medical History:  Diagnosis Date  . Cancer ([place])    liver mets; colon cancer  . Constipation   . Diabetes mellitus without complication ([place])   . HOH (hard of hearing)   . Hypertension   . Insomnia   . 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 Wears dentures    partial upper and lower    Past Surgical History:  Procedure Laterality Date  . CARDIAC CATHETERIZATION    . COLONOSCOPY WITH PROPOFOL N/A 09/07/2014   Procedure: COLONOSCOPY WITH PROPOFOL;  Surgeon: Lucilla Lame, MD;  Location: [facility] ENDOSCOPY;  Service: Endoscopy;  Laterality: N/A;  . ESOPHAGOGASTRODUODENOSCOPY N/A 09/07/2014   Procedure: ESOPHAGOGASTRODUODENOSCOPY (EGD);  Surgeon: Lucilla Lame, MD;  Location: [facility] ENDOSCOPY;  Service: Endoscopy;  Laterality: N/A;   . FLEXIBLE SIGMOIDOSCOPY N/A 09/02/2015   Procedure: FLEXIBLE SIGMOIDOSCOPY;  Surgeon: Lucilla Lame, MD;  Location: [name];  Service: Endoscopy;  Laterality: N/A;  Diabetic - oral meds Pt has port-a-cath  . LIVER BIOPSY    . PORTACATH PLACEMENT Left 09/24/2014   Procedure: INSERTION PORT-A-CATH;  Surgeon: Robert Bellow, MD;  Location: [facility] ORS;  Service: General;  Laterality: Left;    Family History  Problem Relation Age of Onset  . Cancer Sister     Social History:  reports that she quit smoking about 2 years ago. Her smoking use included Cigarettes. She has a 5.00 pack-year smoking history. She has never used smokeless tobacco. She reports that she does not drink alcohol or use drugs.  The patient is accompanied by her daughter.  Allergies:  Allergies  Allergen Reactions  . Tylenol [Acetaminophen] Nausea And Vomiting    Medications Prior to Admission  Medication Sig Dispense Refill  . amiodarone (PACERONE) 400 MG tablet Take 0.5 tablets (200 mg total) by mouth 2 (two) times daily. (Patient taking differently: Take 400 mg by mouth daily. ) 30 tablet 0  . aspirin EC 81 MG tablet Take 81 mg by mouth daily. Reported on 08/26/2015    . atorvastatin (LIPITOR) 40 MG tablet Take 40 mg by mouth daily.    Marland Kitchen gabapentin (NEURONTIN) 100 MG capsule Take 2 capsules (200 mg total) by mouth at bedtime. 1 tab daily and 2 tabs at bedtime. (Patient taking differently: Take 200 mg by mouth at bedtime. ) 60 capsule 0  . lisinopril (PRINIVIL,ZESTRIL) 10 MG tablet  Take 10 mg by mouth daily.    . magnesium 30 MG tablet Take 30 mg by mouth daily.     . megestrol (MEGACE) 400 MG/10ML suspension Take 5 mLs (200 mg total) by mouth daily. to stimulate appetite (Patient taking differently: Take 200 mg by mouth 2 (two) times daily. to stimulate appetite) 240 mL 0  . metFORMIN (GLUCOPHAGE) 1000 MG tablet Take 1,000 mg by mouth 2 (two) times daily.     . Multiple Vitamin (MULTIVITAMIN WITH MINERALS)  TABS tablet Take 1 tablet by mouth daily.    . ondansetron (ZOFRAN) 4 MG tablet Take 1 tablet (4 mg total) by mouth every 8 (eight) hours as needed for nausea or vomiting. 90 tablet 2  . oxyCODONE (OXY IR/ROXICODONE) 5 MG immediate release tablet Take 1 tablet (5 mg total) by mouth every 6 (six) hours as needed for severe pain. 30 tablet 0  . regorafenib (STIVARGA) 40 MG tablet Take with low fat meal. Caution: Chemotherapy. Take for 21 days and then 7 days off 84 tablet 0    Review of Systems: GENERAL:  Active prior to admission.  No fevers, sweats or weight loss. PERFORMANCE STATUS (ECOG):  2 HEENT:  No visual changes, runny nose, sore throat, mouth sores or tenderness. Lungs:  Shortness of breath and chest pain prior to admission.  No cough.  No hemoptysis. Cardiac:  No chest pain, palpitations, orthopnea, or PND. GI:  Diarrhea prior to admission.  No nausea, vomiting,constipation, melena or hematochezia. GU:  No urgency, frequency, dysuria, or hematuria. Musculoskeletal:  No back pain.  No joint pain.  No muscle tenderness. Extremities:  No pain or swelling. Skin:  No rashes or skin changes. Neuro:  No headache, numbness or weakness, balance or coordination issues. Endocrine:  No diabetes, thyroid issues, hot flashes or night sweats. Psych:  No mood changes, depression or anxiety. Pain:  No focal pain. Review of systems:  All other systems reviewed and found to be negative.  Physical Exam:  Blood pressure (!) 107/55, pulse 65, temperature 98.5 F (36.9 C), resp. rate 20, height 5' 5"  (1.651 m), weight 153 lb 3 oz (69.5 kg), SpO2 99 %.  GENERAL:  Elderly woman resting comfortably on the medical unit in no acute distress. MENTAL STATUS:  Sleeping HEAD:  Wearing a head wrap.  Normocephalic, atraumatic, face symmetric, no Cushingoid features. EYES:  Eyes closed. NEUROLOGICAL: Sleeping comfortably.  Daughter requests that I do not examine her.   Results for orders placed or performed  during the hospital encounter of 02/29/16 (from the past 48 hour(s))  CBC with Differential/Platelet     Status: Abnormal   Collection Time: 02/29/16  9:15 PM  Result Value Ref Range   WBC 13.9 (H) 3.6 - 11.0 K/uL   RBC 3.26 (L) 3.80 - 5.20 MIL/uL   Hemoglobin 9.7 (L) 12.0 - 16.0 g/dL   HCT 29.7 (L) 35.0 - 47.0 %   MCV 91.1 80.0 - 100.0 fL   MCH 29.8 26.0 - 34.0 pg   MCHC 32.7 32.0 - 36.0 g/dL   RDW 17.5 (H) 11.5 - 14.5 %   Platelets 405 150 - 440 K/uL   Neutrophils Relative % 88 %   Neutro Abs 12.3 (H) 1.4 - 6.5 K/uL   Lymphocytes Relative 6 %   Lymphs Abs 0.8 (L) 1.0 - 3.6 K/uL   Monocytes Relative 5 %   Monocytes Absolute 0.7 0.2 - 0.9 K/uL   Eosinophils Relative 0 %   Eosinophils Absolute 0.0  0 - 0.7 K/uL   Basophils Relative 1 %   Basophils Absolute 0.1 0 - 0.1 K/uL  Comprehensive metabolic panel     Status: Abnormal   Collection Time: 02/29/16  9:15 PM  Result Value Ref Range   Sodium 139 135 - 145 mmol/L   Potassium 4.5 3.5 - 5.1 mmol/L   Chloride 110 101 - 111 mmol/L   CO2 21 (L) 22 - 32 mmol/L   Glucose, Bld 86 65 - 99 mg/dL   BUN 19 6 - 20 mg/dL   Creatinine, Ser 1.25 (H) 0.44 - 1.00 mg/dL   Calcium 8.2 (L) 8.9 - 10.3 mg/dL   Total Protein 5.0 (L) 6.5 - 8.1 g/dL   Albumin 1.9 (L) 3.5 - 5.0 g/dL   AST 28 15 - 41 U/L   ALT 13 (L) 14 - 54 U/L   Alkaline Phosphatase 182 (H) 38 - 126 U/L   Total Bilirubin 0.4 0.3 - 1.2 mg/dL   GFR calc non Af Amer 42 (L) >60 mL/min   GFR calc Af Amer 49 (L) >60 mL/min    Comment: (NOTE) The eGFR has been calculated using the CKD EPI equation. This calculation has not been validated in all clinical situations. eGFR's persistently <60 mL/min signify possible Chronic Kidney Disease.    Anion gap 8 5 - 15  Troponin I     Status: None   Collection Time: 02/29/16  9:15 PM  Result Value Ref Range   Troponin I <0.03 <0.03 ng/mL  Magnesium     Status: Abnormal   Collection Time: 02/29/16  9:15 PM  Result Value Ref Range   Magnesium  1.5 (L) 1.7 - 2.4 mg/dL  APTT     Status: Abnormal   Collection Time: 02/29/16  9:15 PM  Result Value Ref Range   aPTT 39 (H) 24 - 36 seconds    Comment:        IF BASELINE aPTT IS ELEVATED, SUGGEST PATIENT RISK ASSESSMENT BE USED TO DETERMINE APPROPRIATE ANTICOAGULANT THERAPY.   Protime-INR     Status: Abnormal   Collection Time: 02/29/16  9:15 PM  Result Value Ref Range   Prothrombin Time 16.0 (H) 11.4 - 15.2 seconds   INR 1.27   Brain natriuretic peptide     Status: Abnormal   Collection Time: 02/29/16  9:15 PM  Result Value Ref Range   B Natriuretic Peptide 116.0 (H) 0.0 - 100.0 pg/mL  Urinalysis complete, with microscopic ([facility] only)     Status: Abnormal   Collection Time: 02/29/16  9:42 PM  Result Value Ref Range   Color, Urine YELLOW (A) YELLOW   APPearance CLEAR (A) CLEAR   Glucose, UA NEGATIVE NEGATIVE mg/dL   Bilirubin Urine NEGATIVE NEGATIVE   Ketones, ur NEGATIVE NEGATIVE mg/dL   Specific Gravity, Urine 1.015 1.005 - 1.030   Hgb urine dipstick NEGATIVE NEGATIVE   pH 5.0 5.0 - 8.0   Protein, ur NEGATIVE NEGATIVE mg/dL   Nitrite NEGATIVE NEGATIVE   Leukocytes, UA NEGATIVE NEGATIVE   RBC / HPF 0-5 0 - 5 RBC/hpf   WBC, UA 0-5 0 - 5 WBC/hpf   Bacteria, UA NONE SEEN NONE SEEN   Squamous Epithelial / LPF NONE SEEN NONE SEEN   Mucous PRESENT    Hyaline Casts, UA PRESENT   Glucose, capillary     Status: None   Collection Time: 03/01/16  7:56 AM  Result Value Ref Range   Glucose-Capillary 68 65 - 99 mg/dL  Glucose, capillary  Status: None   Collection Time: 03/01/16  8:31 AM  Result Value Ref Range   Glucose-Capillary 68 65 - 99 mg/dL  Heparin level (unfractionated)     Status: Abnormal   Collection Time: 03/01/16  9:36 AM  Result Value Ref Range   Heparin Unfractionated 0.79 (H) 0.30 - 0.70 IU/mL    Comment:        IF HEPARIN RESULTS ARE BELOW EXPECTED VALUES, AND PATIENT DOSAGE HAS BEEN CONFIRMED, SUGGEST FOLLOW UP TESTING OF ANTITHROMBIN III LEVELS.    Basic metabolic panel     Status: Abnormal   Collection Time: 03/01/16  9:36 AM  Result Value Ref Range   Sodium 139 135 - 145 mmol/L   Potassium 4.3 3.5 - 5.1 mmol/L   Chloride 114 (H) 101 - 111 mmol/L   CO2 19 (L) 22 - 32 mmol/L   Glucose, Bld 102 (H) 65 - 99 mg/dL   BUN 17 6 - 20 mg/dL   Creatinine, Ser 1.02 (H) 0.44 - 1.00 mg/dL   Calcium 8.0 (L) 8.9 - 10.3 mg/dL   GFR calc non Af Amer 54 (L) >60 mL/min   GFR calc Af Amer >60 >60 mL/min    Comment: (NOTE) The eGFR has been calculated using the CKD EPI equation. This calculation has not been validated in all clinical situations. eGFR's persistently <60 mL/min signify possible Chronic Kidney Disease.    Anion gap 6 5 - 15  CBC     Status: Abnormal   Collection Time: 03/01/16  9:36 AM  Result Value Ref Range   WBC 10.6 3.6 - 11.0 K/uL   RBC 2.93 (L) 3.80 - 5.20 MIL/uL   Hemoglobin 8.6 (L) 12.0 - 16.0 g/dL   HCT 26.1 (L) 35.0 - 47.0 %   MCV 89.0 80.0 - 100.0 fL   MCH 29.5 26.0 - 34.0 pg   MCHC 33.1 32.0 - 36.0 g/dL   RDW 18.0 (H) 11.5 - 14.5 %   Platelets 350 150 - 440 K/uL  Glucose, capillary     Status: None   Collection Time: 03/01/16 11:47 AM  Result Value Ref Range   Glucose-Capillary 69 65 - 99 mg/dL  Glucose, capillary     Status: None   Collection Time: 03/01/16  4:25 PM  Result Value Ref Range   Glucose-Capillary 73 65 - 99 mg/dL  Heparin level (unfractionated)     Status: Abnormal   Collection Time: 03/01/16  7:14 PM  Result Value Ref Range   Heparin Unfractionated 0.73 (H) 0.30 - 0.70 IU/mL    Comment:        IF HEPARIN RESULTS ARE BELOW EXPECTED VALUES, AND PATIENT DOSAGE HAS BEEN CONFIRMED, SUGGEST FOLLOW UP TESTING OF ANTITHROMBIN III LEVELS.   Glucose, capillary     Status: None   Collection Time: 03/01/16  8:49 PM  Result Value Ref Range   Glucose-Capillary 89 65 - 99 mg/dL  Glucose, capillary     Status: Abnormal   Collection Time: 03/02/16  1:12 AM  Result Value Ref Range    Glucose-Capillary 100 (H) 65 - 99 mg/dL  Basic metabolic panel     Status: Abnormal   Collection Time: 03/02/16  4:30 AM  Result Value Ref Range   Sodium 142 135 - 145 mmol/L   Potassium 4.3 3.5 - 5.1 mmol/L   Chloride 114 (H) 101 - 111 mmol/L   CO2 23 22 - 32 mmol/L   Glucose, Bld 78 65 - 99 mg/dL   BUN 14  6 - 20 mg/dL   Creatinine, Ser 0.93 0.44 - 1.00 mg/dL   Calcium 8.5 (L) 8.9 - 10.3 mg/dL   GFR calc non Af Amer 60 (L) >60 mL/min   GFR calc Af Amer >60 >60 mL/min    Comment: (NOTE) The eGFR has been calculated using the CKD EPI equation. This calculation has not been validated in all clinical situations. eGFR's persistently <60 mL/min signify possible Chronic Kidney Disease.    Anion gap 5 5 - 15  CBC     Status: Abnormal   Collection Time: 03/02/16  4:30 AM  Result Value Ref Range   WBC 14.2 (H) 3.6 - 11.0 K/uL   RBC 3.47 (L) 3.80 - 5.20 MIL/uL   Hemoglobin 10.2 (L) 12.0 - 16.0 g/dL   HCT 31.0 (L) 35.0 - 47.0 %   MCV 89.2 80.0 - 100.0 fL   MCH 29.5 26.0 - 34.0 pg   MCHC 33.1 32.0 - 36.0 g/dL   RDW 18.2 (H) 11.5 - 14.5 %   Platelets 413 150 - 440 K/uL  Heparin level (unfractionated)     Status: Abnormal   Collection Time: 03/02/16  4:30 AM  Result Value Ref Range   Heparin Unfractionated 0.74 (H) 0.30 - 0.70 IU/mL    Comment:        IF HEPARIN RESULTS ARE BELOW EXPECTED VALUES, AND PATIENT DOSAGE HAS BEEN CONFIRMED, SUGGEST FOLLOW UP TESTING OF ANTITHROMBIN III LEVELS.   Glucose, capillary     Status: Abnormal   Collection Time: 03/02/16  5:52 AM  Result Value Ref Range   Glucose-Capillary 115 (H) 65 - 99 mg/dL  Glucose, capillary     Status: None   Collection Time: 03/02/16 11:35 AM  Result Value Ref Range   Glucose-Capillary 80 65 - 99 mg/dL   Dg Chest 2 View  Result Date: 02/29/2016 CLINICAL DATA:  Shortness of breath with chest pain. History of colon carcinoma EXAM: CHEST  2 VIEW COMPARISON:  Chest radiograph April 02, 2016 and chest CT April 03, 2016 FINDINGS: There is no edema or consolidation. The heart is upper normal in size with pulmonary vascularity within normal limits. Port-A-Cath tip is in the right atrium. No pneumothorax. There is aortic atherosclerosis. There is degenerative change in thoracic spine. There is leftward deviation of the upper thoracic trachea. IMPRESSION: No edema or consolidation. No adenopathy. Aortic atherosclerosis. Port-A-Cath tip is in the superior vena cava. Tracheal deviation is due to enlarged right thyroid as noted on recent chest CT. Electronically Signed   By: Lowella Grip III M.D.   On: 02/29/2016 22:01   Ct Head Wo Contrast  Result Date: 02/29/2016 CLINICAL DATA:  Altered mental status.  Colon cancer. EXAM: CT HEAD WITHOUT CONTRAST TECHNIQUE: Contiguous axial images were obtained from the base of the skull through the vertex without intravenous contrast. COMPARISON:  Head CT 02/01/2016 FINDINGS: Brain: No mass lesion, intraparenchymal hemorrhage or extra-axial collection. No evidence of acute cortical infarct. There is periventricular hypoattenuation compatible with chronic microvascular disease. Vascular: No hyperdense vessel or unexpected calcification. Skull: Normal visualized skull base, calvarium and extracranial soft tissues. Sinuses/Orbits: No sinus fluid levels or advanced mucosal thickening. No mastoid effusion. Normal orbits. IMPRESSION: 1. No acute intracranial abnormality.  No mass lesion. 2. Chronic microvascular ischemia. Electronically Signed   By: Ulyses Jarred M.D.   On: 02/29/2016 23:50   Ct Angio Chest Pe W And/or Wo Contrast  Result Date: 02/29/2016 CLINICAL DATA:  Shortness of breath. Delays and weakness.  Left-sided chest pain. Stage IV colon cancer with liver metastases. EXAM: CT ANGIOGRAPHY CHEST WITH CONTRAST TECHNIQUE: Multidetector CT imaging of the chest was performed using the standard protocol during bolus administration of intravenous contrast. Multiplanar CT image  reconstructions and MIPs were obtained to evaluate the vascular anatomy. CONTRAST:  60 mL Isovue 370 IV COMPARISON:  chest CT 02/02/2016 FINDINGS: Cardiovascular: Contrast injection is sufficient to demonstrate satisfactory opacification of the pulmonary arteries to the segmental level. There is a focal filling defect within the right middle lobe medial segmental pulmonary artery (series 9, image 41). No other pulmonary embolus is identified. The main pulmonary artery is mildly dilated, measuring 3.2 cm at the bifurcation. There is no CT evidence of acute right heart strain. There is atherosclerotic calcification of the aorta. There is a normal 3-vessel arch branching pattern. Heart size is normal, without pericardial effusion. There is a left chest wall Port-A-Cath with its tip at the cavoatrial junction. Mediastinum/Nodes: The thyroid gland is enlarged, particularly the right lobe, with a predominantly hypodense nodule measuring 2.8 x 3 cm. The visualized course of the thoracic esophagus is normal. No mediastinal or hilar lymphadenopathy. No axillary adenopathy. Lungs/Pleura: No pulmonary nodules or masses. No pleural effusion or pneumothorax. No focal airspace consolidation. No focal pleural abnormality. Upper Abdomen: Contrast bolus timing is not optimized for evaluation of the abdominal organs. There are multiple hyper enhancing lesions within the liver, which are incompletely visualized. The lesion in the superior caudate, for comparison, is unchanged in size measuring 1.8 x 1.4 cm. Musculoskeletal: No chest wall abnormality. No acute or significant osseous findings. Review of the MIP images confirms the above findings. IMPRESSION: 1. Small pulmonary embolus within the right middle lobe medial segmental pulmonary artery. No evidence of right heart strain. 2. Incomplete visualization of metastatic disease within the liver, which appears unchanged compared to the 02/02/2016 CT. 3. Aortic atherosclerosis. 4. Mild  dilatation of the main pulmonary artery may indicate underlying pulmonary hypertension. 5. No evidence of pulmonary metastatic disease. 6. Heterogeneous nodule within the right thyroid gland. This has reportedly been previously biopsied. Critical Value/emergent results were called by telephone at the time of interpretation on 02/29/2016 at 11:46 pm to Dr. Merlyn Lot , who verbally acknowledged these results. Electronically Signed   By: Ulyses Jarred M.D.   On: 02/29/2016 23:47    Assessment:  The patient is a 70 y.o. woman with metastatic colon cancer admitted with a small pulmonary embolism.  She is currently on Lovenox.    Plan:   1.  Oncology:  Patient has metastatic colon cancer.  She previously expressed the desire to try 4th line therapy (Stivarga).  Medication was recently approved/arrived.  Anticipate initiation of Stivarga (an oral TKI / VEGF inhibitor) after discharge.  Discussed with daughter.  2.  Hematology:  Small pulmonary embolism noted on chest CT angiogram.  Patient currently on Lovenox.  Will discuss with patient tomorrow her thoughts about Lovenox or possible Eliquis for ease of administration.  3.  Neurology:  Patient agitated/restless earlier in day.  Currently sleeping (daughter requested that I not wake her).  Suspect she was agitated secondary to lack of sleep.  Will reassess in AM.   4.  Disposition:  Anticipate discharge tomorrow.  Thank you for allowing me to participate in Sandy [name] 's care.  I will follow her closely with you while hospitalized and after discharge in the outpatient department.   Lequita Asal, MD  03/02/2016

## 2016-03-02 NOTE — Progress Notes (Signed)
Chaplain visited with pt in room 116. Provided the ministry of prayer and a pastoral presence. Pt was suffering from a pulmonary problem but was optimistic regarding the care they are receiving.    03/02/16 1305  Clinical Encounter Type  Visited With Patient  Visit Type Initial;Spiritual support  Referral From Nurse  Spiritual Encounters  Spiritual Needs Prayer;Emotional

## 2016-03-02 NOTE — Progress Notes (Signed)
Pharmacy Note - Anticoagulation  Heparin drip changed to enoxaparin 1 mg/kg q12h (CrCl  53.5 ml/min) by MD. Heparin drip stopped at 1043 AM per MAR. Informed RN that enoxaparin dose needs to be given one hour after heparin drip turned off; enoxaparin order retimed to start at 1145 AM. RN acknowledged plan.

## 2016-03-03 LAB — GLUCOSE, CAPILLARY
GLUCOSE-CAPILLARY: 60 mg/dL — AB (ref 65–99)
GLUCOSE-CAPILLARY: 92 mg/dL (ref 65–99)
Glucose-Capillary: 62 mg/dL — ABNORMAL LOW (ref 65–99)
Glucose-Capillary: 75 mg/dL (ref 65–99)
Glucose-Capillary: 86 mg/dL (ref 65–99)

## 2016-03-03 LAB — HEMOGLOBIN: HEMOGLOBIN: 8.4 g/dL — AB (ref 12.0–16.0)

## 2016-03-03 MED ORDER — APIXABAN 5 MG PO TABS: 5.0000 mg | ORAL_TABLET | Freq: Two times a day (BID) | ORAL | 0 refills | Status: DC

## 2016-03-03 MED ORDER — LORAZEPAM 0.5 MG PO TABS: 0.5000 mg | ORAL_TABLET | Freq: Every evening | ORAL | 0 refills | Status: AC | PRN

## 2016-03-03 MED ORDER — SODIUM CHLORIDE 0.9 % IV SOLN: INTRAVENOUS | Status: DC

## 2016-03-03 MED ORDER — HEPARIN SOD (PORK) LOCK FLUSH 100 UNIT/ML IV SOLN
500.0000 [IU] | INTRAVENOUS | Status: AC | PRN
  Administered 2016-03-03: 17:00:00 500 [IU]
  Filled 2016-03-03: qty 5

## 2016-03-03 MED ORDER — BOOST / RESOURCE BREEZE PO LIQD
1.0000 | Freq: Three times a day (TID) | ORAL | Status: DC
  Administered 2016-03-03: 16:00:00 1 via ORAL

## 2016-03-03 NOTE — Care Management (Signed)
Discharge to home today per Dr. Darvin Neighbours. Daughter, [name], would like to transport per private car. [facility] (Medical Necessity Form) completed and placed in chart, if needed.  Shelbie Ammons RN MSN CCM Care Management

## 2016-03-03 NOTE — Discharge Instructions (Signed)
Resume diet and activity as before ° ° °

## 2016-03-03 NOTE — Progress Notes (Signed)
Patient is d/ced home.  Admitted for SOB r/t small PE.  She was on a heparin drip and is going home on elequis.  Ativan was started on this admission b/c patient has had severe restlessness and agitation.  Daughter refused palliative consult.  She is primary care giver.  Hgb has d/creased from 10.2 to 8.4.  Patient to start new chemo medication at home.  Patient has had low blood sugars.  Metformin was held and dr recommended it be held based on blood sugars at home.

## 2016-03-03 NOTE — Progress Notes (Signed)
Visit made. Patient seen lying in bed, eyes closed, daughter [name] at bedside, moving about the room, staff RN Stanton Kidney present giving scheduled meds. Patient took medications with applesauce. Per [name] and discussion with [facility] Hassan Rowan plan is for discharge today. Patient continues to call out and be restless at times, she has received oxycodone 5 mg and lorazepam 0.5 mg this morning. Hassan Rowan ware of need for ROC orders for SN and SW. Will continue to follow and update [facility] team. Thank you. Flo Shanks RN, BSN, [facility] Liaison [phone] c

## 2016-03-10 NOTE — Discharge Summary (Signed)
[facility] [place] NAME: Sandy [name]    MR#:  [id number]  DATE OF BIRTH:  1946/02/09  DATE OF ADMISSION:  02/29/2016 ADMITTING PHYSICIAN: Lance Coon, MD  DATE OF DISCHARGE: 03/03/2016  6:25 PM  PRIMARY CARE PHYSICIAN: Harriett Neita Carp, MD   ADMISSION DIAGNOSIS:  Hypomagnesemia [E83.42] Weakness [R53.1] Other acute pulmonary embolism without acute cor pulmonale (HCC) [I26.99] Leukocytosis, unspecified type [D72.829]  DISCHARGE DIAGNOSIS:  Principal Problem:   PE (pulmonary thromboembolism) ([place]) Active Problems:   Colon cancer metastasized to liver (HCC)   Benign essential HTN   Type 2 diabetes mellitus ([place])   Pulmonary embolus (HCC)   Pressure injury of skin   SECONDARY DIAGNOSIS:   Past Medical History:  Diagnosis Date  . Cancer ([place])    liver mets; colon cancer  . Constipation   . Diabetes mellitus without complication ([place])   . HOH (hard of hearing)   . Hypertension   . Insomnia   . 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 Wears dentures    partial upper and lower     ADMITTING HISTORY  Sandy [name]  is a 70 y.o. female who presents with Weakness and chest discomfort. Patient developed chest discomfort and shortness of breath at home. The symptoms occurred within the last 24 hours and were getting progressively worse. Here in the ED she was found to have a small PE. No right heart strain noted on CT scan. Patient's blood pressure and heart rate are stable and within normal limits. Her oxygen level is good on room air. However, given her persistent chest discomfort and overall weakness hospitals were called for admission.  HOSPITAL COURSE:   PE (pulmonary thromboembolism) ([place]) Initially treated with Heprin drip. Changed to lovenox SQ. Dr. Geralyn Flash after discussing with patients's daughter decided to treat with Eliquis as OP. Prescription given at discharge  Colon cancer metastasized  to liver [facility]) Poor prognosis Candidate for hospice but daughter wants aggressive treatement at this time  Benign essential HTN - stable, continue home meds  Type 2 diabetes mellitus ([place])  Stable for discharge home  CONSULTS OBTAINED:    DRUG ALLERGIES:   Allergies  Allergen Reactions  . Tylenol [Acetaminophen] Nausea And Vomiting    DISCHARGE MEDICATIONS:   Discharge Medication List as of 03/03/2016  5:02 PM    START taking these medications   Details  apixaban (ELIQUIS) 5 MG TABS tablet Take 1 tablet (5 mg total) by mouth 2 (two) times daily. 10 mg 2 times a day for one week and then 5 mg 2 times a day, Starting Tue 03/03/2016, Print    LORazepam (ATIVAN) 0.5 MG tablet Take 1 tablet (0.5 mg total) by mouth at bedtime as needed for anxiety or sleep., Starting Tue 03/03/2016, Print      CONTINUE these medications which have NOT CHANGED   Details  amiodarone (PACERONE) 400 MG tablet Take 0.5 tablets (200 mg total) by mouth 2 (two) times daily., Starting Sun 01/05/2016, Print    aspirin EC 81 MG tablet Take 81 mg by mouth daily. Reported on 08/26/2015, Historical Med    atorvastatin (LIPITOR) 40 MG tablet Take 40 mg by mouth daily., Historical Med    gabapentin (NEURONTIN) 100 MG capsule Take 2 capsules (200 mg total) by mouth at bedtime. 1 tab daily and 2 tabs at bedtime., Starting Tue 07/16/2015, Normal    lisinopril (PRINIVIL,ZESTRIL) 10 MG tablet Take 10 mg  by mouth daily., Historical Med    magnesium 30 MG tablet Take 30 mg by mouth daily. , Historical Med    megestrol (MEGACE) 400 MG/10ML suspension Take 5 mLs (200 mg total) by mouth daily. to stimulate appetite, Starting Tue 11/06/2014, Normal    metFORMIN (GLUCOPHAGE) 1000 MG tablet Take 1,000 mg by mouth 2 (two) times daily. , Historical Med    Multiple Vitamin (MULTIVITAMIN WITH MINERALS) TABS tablet Take 1 tablet by mouth daily., Historical Med    ondansetron (ZOFRAN) 4 MG tablet Take 1 tablet (4 mg  total) by mouth every 8 (eight) hours as needed for nausea or vomiting., Starting Thu 12/19/2015, Normal    oxyCODONE (OXY IR/ROXICODONE) 5 MG immediate release tablet Take 1 tablet (5 mg total) by mouth every 6 (six) hours as needed for severe pain., Starting Thu 02/20/2016, Print    regorafenib (STIVARGA) 40 MG tablet Take with low fat meal. Caution: Chemotherapy. Take for 21 days and then 7 days off, Print        Today   VITAL SIGNS:  Blood pressure 124/76, pulse 65, temperature 97.4 F (36.3 C), temperature source Oral, resp. rate 16, height 5\' 5"  (1.651 m), weight 69.5 kg (153 lb 3 oz), SpO2 100 %.  I/O:  No intake or output data in the 24 hours ending 03/10/16 0258  PHYSICAL EXAMINATION:  Physical Exam  GENERAL:  70 y.o.-year-old patient lying in the bed with no acute distress.  LUNGS: Normal breath sounds bilaterally, no wheezing, rales,rhonchi or crepitation. No use of accessory muscles of respiration.  CARDIOVASCULAR: S1, S2 normal. No murmurs, rubs, or gallops.  ABDOMEN: Soft, non-tender, non-distended. Bowel sounds present. No organomegaly or mass.  NEUROLOGIC: Moves all 4 extremities. PSYCHIATRIC: The patient is alert and awake. Pleasantly confused SKIN: No obvious rash, lesion, or ulcer.   DATA REVIEW:   CBC  Recent Labs Lab 03/03/16 1006  HGB 8.4*    Chemistries  No results for input(s): NA, K, CL, CO2, GLUCOSE, BUN, CREATININE, CALCIUM, MG, AST, ALT, ALKPHOS, BILITOT in the last 168 hours.  Invalid input(s): GFRCGP  Cardiac Enzymes No results for input(s): TROPONINI in the last 168 hours.  Microbiology Results  Results for orders placed or performed during the hospital encounter of 02/01/16  Urine culture     Status: None   Collection Time: 02/01/16  4:13 PM  Result Value Ref Range Status   Specimen Description URINE, RANDOM  Final   Special Requests NONE  Final   Culture NO GROWTH Performed at [facility]   Final   Report Status 02/03/2016  FINAL  Final  Culture, blood (routine x 2)     Status: None   Collection Time: 02/01/16  4:16 PM  Result Value Ref Range Status   Specimen Description BLOOD LEFT ANTECUBITAL  Final   Special Requests   Final    BOTTLES DRAWN AEROBIC AND ANAEROBIC  AER 12CC ANA [place]   Culture NO GROWTH 5 DAYS  Final   Report Status 02/06/2016 FINAL  Final  Culture, blood (routine x 2)     Status: None   Collection Time: 02/01/16  4:16 PM  Result Value Ref Range Status   Specimen Description BLOOD RIGHT ANTECUBITAL  Final   Special Requests BOTTLES DRAWN AEROBIC AND ANAEROBIC  [facility]  Final   Culture NO GROWTH 5 DAYS  Final   Report Status 02/06/2016 FINAL  Final    RADIOLOGY:  No results found.  Follow up with PCP in 1  week.  Management plans discussed with the patient, family and they are in agreement.  CODE STATUS:  Code Status History    Date Active Date Inactive Code Status Order ID Comments User Context   03/01/2016  4:02 AM 03/03/2016  9:25 PM Full Code [id number]  Lance Coon, MD Inpatient   02/01/2016  6:43 PM 02/03/2016  7:33 PM Full Code [id number]  Lytle Butte, MD ED   12/31/2015 10:59 PM 01/05/2016  6:05 PM Full Code [id number]  Theodoro Grist, MD Inpatient   11/11/2015  8:03 AM 11/12/2015  8:20 PM DNR [id number]  Hillary Bow, MD Inpatient   11/08/2015  2:54 PM 11/08/2015  5:44 PM DNR [id number]  Awilda Bill, NP Inpatient   11/07/2015  6:17 PM 11/08/2015  2:54 PM Full Code [id number]  Vaughan Basta, MD Inpatient   08/28/2014  4:37 PM 08/29/2014  8:42 PM Full Code [id number]  Theodoro Grist, MD Inpatient    Advance Directive Documentation   Flowsheet Row Most Recent Value  Type of Advance Directive  Healthcare Power of Attorney  Pre-existing out of facility DNR order (yellow form or pink MOST form)  No data  "MOST" Form in Place?  No data      TOTAL TIME TAKING CARE OF THIS PATIENT ON DAY OF DISCHARGE: more than 30 minutes.   Hillary Bow R M.D on 03/10/2016 at 2:58 AM  Between  7am to 6pm - Pager - [phone]  After 6pm go to [email or web address] - password [email or web address] [facility]  Office  [phone]  CC: Primary care physician; [name], MD  Note: This dictation was prepared with Dragon dictation along with smaller phrase technology. Any transcriptional errors that result from this process are unintentional.

## 2016-03-12 ENCOUNTER — Telehealth: Payer: Self-pay | Admitting: *Deleted

## 2016-03-12 ENCOUNTER — Other Ambulatory Visit: Payer: Self-pay | Admitting: *Deleted

## 2016-03-12 DIAGNOSIS — C189 Malignant neoplasm of colon, unspecified: Secondary | ICD-10-CM

## 2016-03-12 DIAGNOSIS — C787 Secondary malignant neoplasm of liver and intrahepatic bile duct: Principal | ICD-10-CM

## 2016-03-12 MED ORDER — REGORAFENIB 40 MG PO TABS: ORAL_TABLET | ORAL | 0 refills | Status: AC

## 2016-03-12 MED ORDER — REGORAFENIB 40 MG PO TABS: ORAL_TABLET | ORAL | 0 refills | Status: DC
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
## 2016-03-12 NOTE — Telephone Encounter (Signed)
Called to update on her condition, She is not eating, she is not really responsive either, becoming more incontinent of urine and stool. Had a diarrhea stool this morning. Sandy [name] is refusing Hospice stating that will never happen. She states she knows when her mother becomes too dehydrated that she will take her to the hospital. BP 164/84 HR 106. She feels it will not be long before she is back in the hospital.

## 2016-03-12 NOTE — Telephone Encounter (Signed)
  Please touch base with her daughter.  Sounds like she need to come to the hospital soon.  M

## 2016-03-16 ENCOUNTER — Telehealth: Payer: Self-pay | Admitting: *Deleted

## 2016-03-16 ENCOUNTER — Inpatient Hospital Stay: Payer: Medicare Other | Admitting: Hematology and Oncology

## 2016-03-18 ENCOUNTER — Telehealth: Payer: Self-pay | Admitting: *Deleted

## 2016-03-18 NOTE — Telephone Encounter (Signed)
Called daughter and spoke to her about pt. And that dr Mike Gip needs to see her.  When starting new medicine we would have typically seen pt in 2 weeks out.  Per [name] she is weak and she does not feel like she can get her out and bring her here.  I spoke to her that if she is that weak then we will need to stop the stivarga and see if she improves.  Being on the drug can cause fatigue and can cause hgb to drop down.  She understands and will make a plan and get her here. I will check with scheduler and call her tom

## 2016-03-19 ENCOUNTER — Other Ambulatory Visit: Payer: Self-pay | Admitting: *Deleted

## 2016-03-19 MED ORDER — GABAPENTIN 100 MG PO CAPS: ORAL_CAPSULE | ORAL | 1 refills | Status: AC

## 2016-03-19 NOTE — Telephone Encounter (Signed)
Refill authorization request received from Princella Ion, patients daughter called and discussed with her if the patient was still taking gabapentin.  Daughter reports that patient is still taking the medication.

## 2016-03-23 ENCOUNTER — Inpatient Hospital Stay: Payer: Medicare Other | Admitting: Hematology and Oncology

## 2016-03-23 ENCOUNTER — Other Ambulatory Visit: Payer: Self-pay | Admitting: *Deleted

## 2016-03-23 ENCOUNTER — Inpatient Hospital Stay: Payer: Medicare Other

## 2016-03-23 MED ORDER — APIXABAN 5 MG PO TABS: 5.0000 mg | ORAL_TABLET | Freq: Two times a day (BID) | ORAL | 1 refills | Status: AC

## 2016-03-23 MED ORDER — OXYCODONE HCL 5 MG PO TABS: 5.0000 mg | ORAL_TABLET | Freq: Four times a day (QID) | ORAL | 0 refills | Status: AC | PRN

## 2016-03-30 ENCOUNTER — Telehealth: Payer: Self-pay | Admitting: *Deleted
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
## 2016-03-31 ENCOUNTER — Telehealth: Payer: Self-pay | Admitting: *Deleted
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
## 2016-04-07 ENCOUNTER — Other Ambulatory Visit: Payer: Self-pay | Admitting: *Deleted

## 2016-04-07 DIAGNOSIS — C787 Secondary malignant neoplasm of liver and intrahepatic bile duct: Principal | ICD-10-CM

## 2016-04-07 DIAGNOSIS — C187 Malignant neoplasm of sigmoid colon: Secondary | ICD-10-CM

## 2016-04-07 DIAGNOSIS — C189 Malignant neoplasm of colon, unspecified: Secondary | ICD-10-CM

## 2016-04-08 ENCOUNTER — Telehealth: Payer: Self-pay | Admitting: *Deleted

## 2016-04-08 NOTE — Telephone Encounter (Signed)
Called to report that patient has deteriorated and is dying. Daughter [name] is on the fence about Hospice services and wants to speak with Dr Mike Gip or Judeen Hammans about his. They are both out of the office today, so I called and spoke with her and she said that she had said that after [place] could come in. I offered to make the referral after discussing what Hospice could do to her her mother and her, but she is still not ready to commit to referral being made, stating that she just woke up and she needs to get herself together and get her mother situated and she would call back later.

## 2016-04-09 ENCOUNTER — Inpatient Hospital Stay: Payer: No Typology Code available for payment source | Admitting: Hematology and Oncology

## 2016-04-09 ENCOUNTER — Inpatient Hospital Stay: Payer: No Typology Code available for payment source

## 2016-04-09 NOTE — Progress Notes (Deleted)
[facility] day:  04/09/2016   Chief Complaint: Sandy [name] is a 70 y.o. female with stage IV colon cancer who is seen for reassessment.  HPI: The patient was last seen in the medical oncology clinic on 02/11/2016.  At that time, she was seen for assessment after interval hospitalization.  She was feeling better.  She was interested in initiation of Stivarga.  She was admitted to [facility] from 02/29/2016 - 03/03/2016 with a pulmonary embolism.  She presented with weakness, chest discomfort and shortness of breath.  She was treated with heparin then converted to Lovenox.  She was discharged on Eliquis.  She was felt to be a candidate for Hospice.  Her daughter declined.   Past Medical History:  Diagnosis Date  . Cancer ([place])    liver mets; colon cancer  . Constipation   . Diabetes mellitus without complication ([place])   . HOH (hard of hearing)   . Hypertension   . Insomnia   . 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 Wears dentures    partial upper and lower    Past Surgical History:  Procedure Laterality Date  . CARDIAC CATHETERIZATION    . COLONOSCOPY WITH PROPOFOL N/A 09/07/2014   Procedure: COLONOSCOPY WITH PROPOFOL;  Surgeon: Lucilla Lame, MD;  Location: [facility] ENDOSCOPY;  Service: Endoscopy;  Laterality: N/A;  . ESOPHAGOGASTRODUODENOSCOPY N/A 09/07/2014   Procedure: ESOPHAGOGASTRODUODENOSCOPY (EGD);  Surgeon: Lucilla Lame, MD;  Location: [facility] ENDOSCOPY;  Service: Endoscopy;  Laterality: N/A;  . FLEXIBLE SIGMOIDOSCOPY N/A 09/02/2015   Procedure: FLEXIBLE SIGMOIDOSCOPY;  Surgeon: Lucilla Lame, MD;  Location: [facility];  Service: Endoscopy;  Laterality: N/A;  Diabetic - oral meds Pt has port-a-cath  . LIVER BIOPSY    . PORTACATH PLACEMENT Left 09/24/2014   Procedure: INSERTION PORT-A-CATH;  Surgeon: Robert Bellow, MD;  Location: [facility] ORS;  Service: General;  Laterality: Left;    Family History  Problem  Relation Age of Onset  . Cancer Sister     Social History:  reports that she quit smoking about 2 years ago. Her smoking use included Cigarettes. She has a 5.00 pack-year smoking history. She has never used smokeless tobacco. She reports that she does not drink alcohol or use drugs.   The patient's medical power of attorney is her daughter.  The patient is accompanied by her daughter, Sandy [name].    Allergies:  Allergies  Allergen Reactions  . Tylenol [Acetaminophen] Nausea And Vomiting    Current Medications: Current Outpatient Prescriptions  Medication Sig Dispense Refill  . amiodarone (PACERONE) 400 MG tablet Take 0.5 tablets (200 mg total) by mouth 2 (two) times daily. (Patient taking differently: Take 400 mg by mouth daily. ) 30 tablet 0  . apixaban (ELIQUIS) 5 MG TABS tablet Take 1 tablet (5 mg total) by mouth 2 (two) times daily. 60 tablet 1  . aspirin EC 81 MG tablet Take 81 mg by mouth daily. Reported on 08/26/2015    . atorvastatin (LIPITOR) 40 MG tablet Take 40 mg by mouth daily.    Marland Kitchen gabapentin (NEURONTIN) 100 MG capsule 1 capsule every morning and 2 capsules at bedtime. 90 capsule 1  . lisinopril (PRINIVIL,ZESTRIL) 10 MG tablet Take 10 mg by mouth daily.    Marland Kitchen LORazepam (ATIVAN) 0.5 MG tablet Take 1 tablet (0.5 mg total) by mouth at bedtime as needed for anxiety or sleep. 10 tablet 0  . magnesium 30 MG tablet  Take 30 mg by mouth daily.     . megestrol (MEGACE) 400 MG/10ML suspension Take 5 mLs (200 mg total) by mouth daily. to stimulate appetite (Patient taking differently: Take 200 mg by mouth 2 (two) times daily. to stimulate appetite) 240 mL 0  . metFORMIN (GLUCOPHAGE) 1000 MG tablet Take 1,000 mg by mouth 2 (two) times daily.     . Multiple Vitamin (MULTIVITAMIN WITH MINERALS) TABS tablet Take 1 tablet by mouth daily.    . ondansetron (ZOFRAN) 4 MG tablet Take 1 tablet (4 mg total) by mouth every 8 (eight) hours as needed for nausea or vomiting. 90 tablet 2  . oxyCODONE (OXY  IR/ROXICODONE) 5 MG immediate release tablet Take 1 tablet (5 mg total) by mouth every 6 (six) hours as needed for severe pain. 30 tablet 0  . regorafenib (STIVARGA) 40 MG tablet Take with low fat meal. Caution: Chemotherapy. Take 4 tablets daily for 21 days and then 7 days off 84 tablet 0   No current facility-administered medications for this visit.    Facility-Administered Medications Ordered in Other Visits  Medication Dose Route Frequency Provider Last Rate Last Dose  . acetaminophen (TYLENOL) tablet 650 mg  650 mg Oral Once Lequita Asal, MD      . sodium chloride 0.9 % injection 10 mL  10 mL Intracatheter PRN Lequita Asal, MD   10 mL at 10/11/14 1431  . sodium chloride 0.9 % injection 10 mL  10 mL Intracatheter PRN Lequita Asal, MD   10 mL at 02/28/15 1529    Review of Systems:  GENERAL: Feels good.  No fevers.  Weight up 9 pounds. PERFORMANCE STATUS (ECOG): 2. HEENT: Wears a hearing aide. No visual changes, runny nose, sore throat, mouth sores or tenderness. Lungs:  No shortness of breath or cough. No hemoptysis. Cardiac: No chest pain, palpitations, orthopnea, or PND. Off metoprolol.  24 hour Holter pending.  Echo on 02/10/2016. GI: Stomach cramps secondary to lactose intolerance and recent ice cream.  No nausea, vomiting, diarrhea, melena or hematochezia. GU: No dysuria or hematuria. Musculoskeletal: No back pain. No joint pain. No muscle tenderness. Extremities: No pain or swelling. Skin: No rashes, skin lesions, or ulcers Neuro: No headache, numbness or weakness, or balance issues. Endocrine: Diabetes. No thyroid issues, hot flashes or night sweats. Psych: No mood changes or anxiety. Pain: Takes oxycodone in the AM. Review of systems: All other systems   Physical Exam: There were no vitals taken for this visit. GENERAL: Elderly pale appearing woman sitting comfortably in a wheelchair in no acute distress. MENTAL STATUS: Alert and  oriented to person, place and time. HEAD: Wearing a black wrap and scarf.  Alopecia totalis. Normocephalic, atraumatic, face symmetric, no Cushingoid features. EYES: Black rimmed glasses. Hazel/green eyes. Pupils equal round and reactive to light and accomodation. No conjunctivitis or scleral icterus. ENT: Oropharynx clear without lesion. Missing several teeth. Tongue normal. Mucous membranes moist.  RESPIRATORY: Clear to auscultation without rales, wheezes or rhonchi. CARDIOVASCULAR: Regular rate and rhythm without murmur, rub or gallop. ABDOMEN: Soft, non-tender with active bowel sounds. No guarding or rebound tenderness. No hepatosplenomegaly. SKIN: No rashes, ulcer or skin lesions.  EXTREMITIES: No edema, no skin discoloration or tenderness. No palpable cords. LYMPH NODES:  Right low anterior cervical 3 cm soft mass (stable). No palpable supraclavicular, axillary or inguinal adenopathy  NEUROLOGICAL: Appropriate. PSYCH: Affect normal.   No visits with results within 3 Day(s) from this visit.  Latest known visit with  results is:  Admission on 02/29/2016, Discharged on 03/03/2016  Component Date Value Ref Range Status  . WBC 02/29/2016 13.9* 3.6 - 11.0 K/uL Final  . RBC 02/29/2016 3.26* 3.80 - 5.20 MIL/uL Final  . Hemoglobin 02/29/2016 9.7* 12.0 - 16.0 g/dL Final  . HCT 02/29/2016 29.7* 35.0 - 47.0 % Final  . MCV 02/29/2016 91.1  80.0 - 100.0 fL Final  . MCH 02/29/2016 29.8  26.0 - 34.0 pg Final  . MCHC 02/29/2016 32.7  32.0 - 36.0 g/dL Final  . RDW 02/29/2016 17.5* 11.5 - 14.5 % Final  . Platelets 02/29/2016 405  150 - 440 K/uL Final  . Neutrophils Relative % 02/29/2016 88  % Final  . Neutro Abs 02/29/2016 12.3* 1.4 - 6.5 K/uL Final  . Lymphocytes Relative 02/29/2016 6  % Final  . Lymphs Abs 02/29/2016 0.8* 1.0 - 3.6 K/uL Final  . Monocytes Relative 02/29/2016 5  % Final  . Monocytes Absolute 02/29/2016 0.7  0.2 - 0.9 K/uL Final  . Eosinophils Relative  02/29/2016 0  % Final  . Eosinophils Absolute 02/29/2016 0.0  0 - 0.7 K/uL Final  . Basophils Relative 02/29/2016 1  % Final  . Basophils Absolute 02/29/2016 0.1  0 - 0.1 K/uL Final  . Sodium 02/29/2016 139  135 - 145 mmol/L Final  . Potassium 02/29/2016 4.5  3.5 - 5.1 mmol/L Final  . Chloride 02/29/2016 110  101 - 111 mmol/L Final  . CO2 02/29/2016 21* 22 - 32 mmol/L Final  . Glucose, Bld 02/29/2016 86  65 - 99 mg/dL Final  . BUN 02/29/2016 19  6 - 20 mg/dL Final  . Creatinine, Ser 02/29/2016 1.25* 0.44 - 1.00 mg/dL Final  . Calcium 02/29/2016 8.2* 8.9 - 10.3 mg/dL Final  . Total Protein 02/29/2016 5.0* 6.5 - 8.1 g/dL Final  . Albumin 02/29/2016 1.9* 3.5 - 5.0 g/dL Final  . AST 02/29/2016 28  15 - 41 U/L Final  . ALT 02/29/2016 13* 14 - 54 U/L Final  . Alkaline Phosphatase 02/29/2016 182* 38 - 126 U/L Final  . Total Bilirubin 02/29/2016 0.4  0.3 - 1.2 mg/dL Final  . GFR calc non Af Amer 02/29/2016 42* >60 mL/min Final  . GFR calc Af Amer 02/29/2016 49* >60 mL/min Final   Comment: (NOTE) The eGFR has been calculated using the CKD EPI equation. This calculation has not been validated in all clinical situations. eGFR's persistently <60 mL/min signify possible Chronic Kidney Disease.   . Anion gap 02/29/2016 8  5 - 15 Final  . Color, Urine 02/29/2016 YELLOW* YELLOW Final  . APPearance 02/29/2016 CLEAR* CLEAR Final  . Glucose, UA 02/29/2016 NEGATIVE  NEGATIVE mg/dL Final  . Bilirubin Urine 02/29/2016 NEGATIVE  NEGATIVE Final  . Ketones, ur 02/29/2016 NEGATIVE  NEGATIVE mg/dL Final  . Specific Gravity, Urine 02/29/2016 1.015  1.005 - 1.030 Final  . Hgb urine dipstick 02/29/2016 NEGATIVE  NEGATIVE Final  . pH 02/29/2016 5.0  5.0 - 8.0 Final  . Protein, ur 02/29/2016 NEGATIVE  NEGATIVE mg/dL Final  . Nitrite 02/29/2016 NEGATIVE  NEGATIVE Final  . Leukocytes, UA 02/29/2016 NEGATIVE  NEGATIVE Final  . RBC / HPF 02/29/2016 0-5  0 - 5 RBC/hpf Final  . WBC, UA 02/29/2016 0-5  0 - 5 WBC/hpf  Final  . Bacteria, UA 02/29/2016 NONE SEEN  NONE SEEN Final  . Squamous Epithelial / LPF 02/29/2016 NONE SEEN  NONE SEEN Final  . Mucous 02/29/2016 PRESENT   Final  . Hyaline Casts,  UA 02/29/2016 PRESENT   Final  . Troponin I 02/29/2016 <0.03  <0.03 ng/mL Final  . Magnesium 02/29/2016 1.5* 1.7 - 2.4 mg/dL Final  . aPTT 03/01/2016 39* 24 - 36 seconds Final   Comment:        IF BASELINE aPTT IS ELEVATED, SUGGEST PATIENT RISK ASSESSMENT BE USED TO DETERMINE APPROPRIATE ANTICOAGULANT THERAPY.   . Prothrombin Time 03/01/2016 16.0* 11.4 - 15.2 seconds Final  . INR 03/01/2016 1.27   Final  . B Natriuretic Peptide 03/01/2016 116.0* 0.0 - 100.0 pg/mL Final  . Heparin Unfractionated 03/01/2016 0.79* 0.30 - 0.70 IU/mL Final   Comment:        IF HEPARIN RESULTS ARE BELOW EXPECTED VALUES, AND PATIENT DOSAGE HAS BEEN CONFIRMED, SUGGEST FOLLOW UP TESTING OF ANTITHROMBIN III LEVELS.   . Weight 03/01/2016 2451  oz Final  . Height 03/01/2016 65  in Final  . BP 03/01/2016 134/66  mmHg Final  . Sodium 03/01/2016 139  135 - 145 mmol/L Final  . Potassium 03/01/2016 4.3  3.5 - 5.1 mmol/L Final  . Chloride 03/01/2016 114* 101 - 111 mmol/L Final  . CO2 03/01/2016 19* 22 - 32 mmol/L Final  . Glucose, Bld 03/01/2016 102* 65 - 99 mg/dL Final  . BUN 03/01/2016 17  6 - 20 mg/dL Final  . Creatinine, Ser 03/01/2016 1.02* 0.44 - 1.00 mg/dL Final  . Calcium 03/01/2016 8.0* 8.9 - 10.3 mg/dL Final  . GFR calc non Af Amer 03/01/2016 54* >60 mL/min Final  . GFR calc Af Amer 03/01/2016 >60  >60 mL/min Final   Comment: (NOTE) The eGFR has been calculated using the CKD EPI equation. This calculation has not been validated in all clinical situations. eGFR's persistently <60 mL/min signify possible Chronic Kidney Disease.   . Anion gap 03/01/2016 6  5 - 15 Final  . WBC 03/01/2016 10.6  3.6 - 11.0 K/uL Final  . RBC 03/01/2016 2.93* 3.80 - 5.20 MIL/uL Final  . Hemoglobin 03/01/2016 8.6* 12.0 - 16.0 g/dL Final  .  HCT 03/01/2016 26.1* 35.0 - 47.0 % Final  . MCV 03/01/2016 89.0  80.0 - 100.0 fL Final  . MCH 03/01/2016 29.5  26.0 - 34.0 pg Final  . MCHC 03/01/2016 33.1  32.0 - 36.0 g/dL Final  . RDW 03/01/2016 18.0* 11.5 - 14.5 % Final  . Platelets 03/01/2016 350  150 - 440 K/uL Final  . Glucose-Capillary 03/01/2016 68  65 - 99 mg/dL Final  . Glucose-Capillary 03/01/2016 68  65 - 99 mg/dL Final  . Heparin Unfractionated 03/01/2016 0.73* 0.30 - 0.70 IU/mL Final   Comment:        IF HEPARIN RESULTS ARE BELOW EXPECTED VALUES, AND PATIENT DOSAGE HAS BEEN CONFIRMED, SUGGEST FOLLOW UP TESTING OF ANTITHROMBIN III LEVELS.   . Glucose-Capillary 03/01/2016 69  65 - 99 mg/dL Final  . Glucose-Capillary 03/01/2016 73  65 - 99 mg/dL Final  . Sodium 03/02/2016 142  135 - 145 mmol/L Final  . Potassium 03/02/2016 4.3  3.5 - 5.1 mmol/L Final  . Chloride 03/02/2016 114* 101 - 111 mmol/L Final  . CO2 03/02/2016 23  22 - 32 mmol/L Final  . Glucose, Bld 03/02/2016 78  65 - 99 mg/dL Final  . BUN 03/02/2016 14  6 - 20 mg/dL Final  . Creatinine, Ser 03/02/2016 0.93  0.44 - 1.00 mg/dL Final  . Calcium 03/02/2016 8.5* 8.9 - 10.3 mg/dL Final  . GFR calc non Af Amer 03/02/2016 60* >60 mL/min Final  .  GFR calc Af Amer 03/02/2016 >60  >60 mL/min Final   Comment: (NOTE) The eGFR has been calculated using the CKD EPI equation. This calculation has not been validated in all clinical situations. eGFR's persistently <60 mL/min signify possible Chronic Kidney Disease.   . Anion gap 03/02/2016 5  5 - 15 Final  . WBC 03/02/2016 14.2* 3.6 - 11.0 K/uL Final  . RBC 03/02/2016 3.47* 3.80 - 5.20 MIL/uL Final  . Hemoglobin 03/02/2016 10.2* 12.0 - 16.0 g/dL Final  . HCT 03/02/2016 31.0* 35.0 - 47.0 % Final  . MCV 03/02/2016 89.2  80.0 - 100.0 fL Final  . MCH 03/02/2016 29.5  26.0 - 34.0 pg Final  . MCHC 03/02/2016 33.1  32.0 - 36.0 g/dL Final  . RDW 03/02/2016 18.2* 11.5 - 14.5 % Final  . Platelets 03/02/2016 413  150 - 440 K/uL  Final  . Heparin Unfractionated 03/02/2016 0.74* 0.30 - 0.70 IU/mL Final   Comment:        IF HEPARIN RESULTS ARE BELOW EXPECTED VALUES, AND PATIENT DOSAGE HAS BEEN CONFIRMED, SUGGEST FOLLOW UP TESTING OF ANTITHROMBIN III LEVELS.   . Glucose-Capillary 03/01/2016 89  65 - 99 mg/dL Final  . Glucose-Capillary 03/02/2016 100* 65 - 99 mg/dL Final  . Glucose-Capillary 03/02/2016 115* 65 - 99 mg/dL Final  . Glucose-Capillary 03/02/2016 80  65 - 99 mg/dL Final  . Hemoglobin 03/03/2016 8.4* 12.0 - 16.0 g/dL Final  . Glucose-Capillary 03/02/2016 73  65 - 99 mg/dL Final  . Glucose-Capillary 03/03/2016 60* 65 - 99 mg/dL Final  . Glucose-Capillary 03/03/2016 62* 65 - 99 mg/dL Final  . Glucose-Capillary 03/03/2016 92  65 - 99 mg/dL Final  . Glucose-Capillary 03/03/2016 75  65 - 99 mg/dL Final  . Glucose-Capillary 03/03/2016 86  65 - 99 mg/dL Final    Assessment:  Sandy [name] is a 70 y.o. female with metastatic colon cancer. She presented with a 53 pound weight loss in 6 months. Colonoscopy on 09/07/2014 revealed a circumferential partially obstructive fungating mass in the descending colon. There was no active bleeding. There was a 1.5 cm pedunculated polyp in the sigmoid colon (tubulovillous adenoma). Pathology from the descending colon mass revealed fragments of adenocarcinoma. There was not enough tissue for KRAS testing.  CEA was 11,793 on 09/05/2014.  KRAS and NRAS mutational analysis on 09/02/2015 was incomplete.  KRAS results from analysis of codons 59, 61, and 117 were negative.  Results were not obtained for codons 12, 13, and 146.  NRAS gene results from analysis of codons 12 and 13 were negative.  Results were not obtained for codons 59, 69, 61, 117, and 146.  Chest CT angiogram on 08/28/2014 revealed no evidence of pulmonary embolism, but multiple large enhancing liver masses (largest 6.4 cm).  PET scan on 10/04/2014 revealed 6.5 x 6.1 cm hypermetabolic mass in the proximal  sigmoid colon,  There were  numerous large hypermetabolic hepatic metastases. There were no additional sites of metastatic disease in the neck, chest or skeleton.  In addition, there was a 3.2 x 2.8 cm low-attenuation right thyroid nodule.   She is status post 14 cycles of FOLFOX chemotherapy (10/09/2014 - 04/11/2015).  She declined Avastin.  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    CEA was 10,169 on 10/09/2014 , 12,034 on 0712/2016, 7765 on 11/20/2014, 5154 on 12/04/2014, 1921 on 01/01/2015, 1266 on 01/15/2015, 770.6 on 01/29/2015, 502.7 on 02/12/2015, 368.7 on 02/26/2015, 269 on 03/14/2015, 178.3 on 03/28/2015, 135.9 on 04/11/2015,  116.1 on 05/13/2015, 90.8 on 06/10/2015, 145.9 on 07/08/2015, 300.7 on 08/05/2015, 296.3 on 08/19/2015, and 443.0 on 09/24/2015, 1341 on 10/08/2015, 523 on 11/12/2015, 532.5 on 12/09/2015, 641.3 on 12/23/2015, 1268.0 on 01/27/2016, and 1708 on 02/11/2016.  Abdominal and pelvic CT scan on 12/28/2014 revealed response to therapy.  Liver metastases and the descending/sigmoid mass were smaller.  There was no new disease.  Abdomen and pelvic CT scan on 03/25/2015 revealed interval decrease in size of hepatic metastasis and distal descending colon mass.   Abdominal and pelvic CT scan on 03/25/2015 revealed decreasing hepatic metastasis and distal descending colon mass.  Right hepatic lobe lesion was 2.2 x 2.1 cm.  Left lateral hepatic lobe lesion was 1.9 x 1.5 cm.  There was stable thickening of the adrenal glands.  She received 8 cycles of 5-fluorouracil and leucovorin (04/28/2014 - 09/24/2015).  Cycle #4 was held on 06/10/2015 secondary to rectal bleeding.  Abdominal and pelvic MRI on 08/01/2015 revealed multiple hypoenhancing hepatic metastasis in both lobes of the liver (3.3 x 2.2 cm in segment VII, 5.9 x 4.1 cm in segment VIII, and a 2.4 x 1.7 cm caudate lesion).  There was a 4.3 cm length of focally narrowed sigmoid colon.  There was nodular  thickening of the right adrenal gland.  PET scan on 08/21/2015 revealed interval improvement in the multifocal hepatic metastatic disease. There was residual hypermetabolic tumor.  The primary colonic lesion at the junction of the descending and sigmoid colon had decreased in size and metabolic activity.  There was persistent hypermetabolic activity in the lower right neck, possibly associated with the thyroid gland.   Abdominal and pelvic CT scan on 10/04/2015 revealed increased bulkiness of soft tissue mass in distal descending colon compared with previous studies, consistent with primary colon carcinoma.  The mass measured 4.7 cm compared to 3.3 cm.  The liver metastasis were felt larger by second radiology review.  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  Afirma gene expression classifier GEC) was suspicious.  Gene expression signature for medullary thyroid cancer (MTC) and BRAF V600E were negative.  Patient wishes to defer any treatment.  She is s/p 3 cycles of FOLFIRI chemotherapy (10/08/2015 - 10/29/2015; 12/23/2015).  Cycle #1 complicated by an admission for fever and neutropenia on day 11 (10/18/2015).  All cultures were negative.  Cycle #2 was complicated by fever and neutropenia (ANC 0) despite Neulasta.  She was diagnosed with Klebsiella pneumonia bacteremia.  Cycle #3 was complicated by fever and neutropenia (ANC 100) despite Neulasta and dose reduction.  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  One copy of the UGT1A1*37 was also present.  It is associated with reduced UGT1A1 expression, but no direct association with irinotecan toxicity.  She was admitted to [facility] from 12/31/2015 - 01/05/2016 with fever and neutropenia.  She was treated with Cefepime.  All cultures remained negative.  While hospitalized she  developed atrial fibrillation.  Her amiodarone was increased.  Nadir ANC was 100 on 12/31/2015.  She was admitted to [facility] from 02/01/2016 - 02/03/2016 with acute encephalopathy possibly due to hypoglycemia.  Sedating agents were minimized.  She had leukocytosis of unclear etiology.  No source of infection was identified.  She was treated with Zosyn.  Chest, abdomen and pelvic CT scan on 02/02/2016 revealed progression of both the primary lesion at the junction of distal descending colon and proximal sigmoid  colon, as well as progression of metastatic disease to the liver as evidenced by increased number and size of numerous hepatic lesions.  There was persistently enlarged and heterogeneously enhancing mass in the right lobe of the thyroid glands.  She has history of atrial fibrillation with RVR.  She is on amiodarone.  She is off metoprolol.  She had a 24 hour Holter.  Echocardiogram on 03/01/2016 revealed an EF of 65-70%..  She was admitted to [facility] from 02/29/2016 - 03/03/2016 with a pulmonary embolism.  She presented with weakness, chest discomfort and shortness of breath.  She was treated with heparin and discharged on Eliquis.  Symptomatically, she is feeling better.  Exam is stable.  Plan: 1.  Labs today:  CBC with diff, CMP, Mg, CEA. 2.  Review events of recent hospitalization.      Lequita Asal, MD  04/09/2016, 5:36 AM

## 2016-04-13 ENCOUNTER — Encounter: Payer: Self-pay | Admitting: Emergency Medicine

## 2016-04-13 ENCOUNTER — Emergency Department: Payer: Medicare Other

## 2016-04-13 ENCOUNTER — Inpatient Hospital Stay
Admission: EM | Admit: 2016-04-13 | Discharge: 2016-05-14 | DRG: 871 | Disposition: E | Payer: Medicare Other | Attending: Internal Medicine | Admitting: Internal Medicine

## 2016-04-13 DIAGNOSIS — T451X5A Adverse effect of antineoplastic and immunosuppressive drugs, initial encounter: Secondary | ICD-10-CM | POA: Diagnosis present

## 2016-04-13 DIAGNOSIS — C189 Malignant neoplasm of colon, unspecified: Secondary | ICD-10-CM | POA: Diagnosis present

## 2016-04-13 DIAGNOSIS — E119 Type 2 diabetes mellitus without complications: Secondary | ICD-10-CM | POA: Diagnosis present

## 2016-04-13 DIAGNOSIS — Z66 Do not resuscitate: Secondary | ICD-10-CM | POA: Diagnosis present

## 2016-04-13 DIAGNOSIS — K5900 Constipation, unspecified: Secondary | ICD-10-CM

## 2016-04-13 DIAGNOSIS — C787 Secondary malignant neoplasm of liver and intrahepatic bile duct: Secondary | ICD-10-CM | POA: Diagnosis present

## 2016-04-13 DIAGNOSIS — Z87891 Personal history of nicotine dependence: Secondary | ICD-10-CM | POA: Diagnosis not present

## 2016-04-13 DIAGNOSIS — N179 Acute kidney failure, unspecified: Secondary | ICD-10-CM | POA: Diagnosis present

## 2016-04-13 DIAGNOSIS — I10 Essential (primary) hypertension: Secondary | ICD-10-CM | POA: Diagnosis present

## 2016-04-13 DIAGNOSIS — Z79899 Other long term (current) drug therapy: Secondary | ICD-10-CM

## 2016-04-13 DIAGNOSIS — G9341 Metabolic encephalopathy: Secondary | ICD-10-CM | POA: Diagnosis present

## 2016-04-13 DIAGNOSIS — J189 Pneumonia, unspecified organism: Secondary | ICD-10-CM | POA: Diagnosis present

## 2016-04-13 DIAGNOSIS — I248 Other forms of acute ischemic heart disease: Secondary | ICD-10-CM | POA: Diagnosis present

## 2016-04-13 DIAGNOSIS — J9601 Acute respiratory failure with hypoxia: Secondary | ICD-10-CM | POA: Diagnosis present

## 2016-04-13 DIAGNOSIS — G4700 Insomnia, unspecified: Secondary | ICD-10-CM | POA: Diagnosis present

## 2016-04-13 DIAGNOSIS — D6481 Anemia due to antineoplastic chemotherapy: Secondary | ICD-10-CM | POA: Diagnosis present

## 2016-04-13 DIAGNOSIS — A419 Sepsis, unspecified organism: Secondary | ICD-10-CM | POA: Diagnosis present

## 2016-04-13 DIAGNOSIS — R6521 Severe sepsis with septic shock: Secondary | ICD-10-CM | POA: Diagnosis not present

## 2016-04-13 DIAGNOSIS — R6520 Severe sepsis without septic shock: Secondary | ICD-10-CM | POA: Diagnosis present

## 2016-04-13 DIAGNOSIS — Z9221 Personal history of antineoplastic chemotherapy: Secondary | ICD-10-CM

## 2016-04-13 DIAGNOSIS — G629 Polyneuropathy, unspecified: Secondary | ICD-10-CM | POA: Diagnosis present

## 2016-04-13 DIAGNOSIS — E860 Dehydration: Secondary | ICD-10-CM | POA: Diagnosis present

## 2016-04-13 DIAGNOSIS — H9190 Unspecified hearing loss, unspecified ear: Secondary | ICD-10-CM | POA: Diagnosis present

## 2016-04-13 DIAGNOSIS — I959 Hypotension, unspecified: Secondary | ICD-10-CM | POA: Diagnosis not present

## 2016-04-13 DIAGNOSIS — Z4659 Encounter for fitting and adjustment of other gastrointestinal appliance and device: Secondary | ICD-10-CM

## 2016-04-13 DIAGNOSIS — E875 Hyperkalemia: Secondary | ICD-10-CM | POA: Diagnosis present

## 2016-04-13 DIAGNOSIS — R001 Bradycardia, unspecified: Secondary | ICD-10-CM | POA: Diagnosis present

## 2016-04-13 DIAGNOSIS — Z86711 Personal history of pulmonary embolism: Secondary | ICD-10-CM

## 2016-04-13 DIAGNOSIS — R609 Edema, unspecified: Secondary | ICD-10-CM

## 2016-04-13 DIAGNOSIS — Z888 Allergy status to other drugs, medicaments and biological substances status: Secondary | ICD-10-CM | POA: Diagnosis not present

## 2016-04-13 DIAGNOSIS — J9690 Respiratory failure, unspecified, unspecified whether with hypoxia or hypercapnia: Secondary | ICD-10-CM

## 2016-04-13 DIAGNOSIS — Z515 Encounter for palliative care: Secondary | ICD-10-CM | POA: Diagnosis present

## 2016-04-13 DIAGNOSIS — E872 Acidosis: Secondary | ICD-10-CM | POA: Diagnosis present

## 2016-04-13 DIAGNOSIS — Z7984 Long term (current) use of oral hypoglycemic drugs: Secondary | ICD-10-CM

## 2016-04-13 DIAGNOSIS — Z7901 Long term (current) use of anticoagulants: Secondary | ICD-10-CM

## 2016-04-13 LAB — LACTIC ACID, PLASMA
Lactic Acid, Venous: 2.2 mmol/L (ref 0.5–1.9)
Lactic Acid, Venous: 2.7 mmol/L (ref 0.5–1.9)

## 2016-04-13 LAB — CBC WITH DIFFERENTIAL/PLATELET
Basophils Absolute: 0 10*3/uL (ref 0–0.1)
Basophils Relative: 0 %
EOS ABS: 0.1 10*3/uL (ref 0–0.7)
EOS PCT: 1 %
HCT: 25.9 % — ABNORMAL LOW (ref 35.0–47.0)
Hemoglobin: 8.1 g/dL — ABNORMAL LOW (ref 12.0–16.0)
LYMPHS ABS: 0.5 10*3/uL — AB (ref 1.0–3.6)
LYMPHS PCT: 3 %
MCH: 29.9 pg (ref 26.0–34.0)
MCHC: 31.3 g/dL — AB (ref 32.0–36.0)
MCV: 95.4 fL (ref 80.0–100.0)
MONO ABS: 0.3 10*3/uL (ref 0.2–0.9)
MONOS PCT: 2 %
Neutro Abs: 15.6 10*3/uL — ABNORMAL HIGH (ref 1.4–6.5)
Neutrophils Relative %: 94 %
PLATELETS: 421 10*3/uL (ref 150–440)
RBC: 2.71 MIL/uL — ABNORMAL LOW (ref 3.80–5.20)
RDW: 22.3 % — ABNORMAL HIGH (ref 11.5–14.5)
WBC: 16.5 10*3/uL — ABNORMAL HIGH (ref 3.6–11.0)

## 2016-04-13 LAB — COMPREHENSIVE METABOLIC PANEL
ALBUMIN: 1.1 g/dL — AB (ref 3.5–5.0)
ALK PHOS: 220 U/L — AB (ref 38–126)
ALT: 31 U/L (ref 14–54)
AST: 62 U/L — AB (ref 15–41)
Anion gap: 6 (ref 5–15)
BUN: 33 mg/dL — AB (ref 6–20)
CALCIUM: 7.4 mg/dL — AB (ref 8.9–10.3)
CHLORIDE: 119 mmol/L — AB (ref 101–111)
CO2: 18 mmol/L — AB (ref 22–32)
CREATININE: 1.52 mg/dL — AB (ref 0.44–1.00)
GFR calc non Af Amer: 33 mL/min — ABNORMAL LOW (ref >60)
GFR, EST AFRICAN AMERICAN: 38 mL/min — AB (ref >60)
GLUCOSE: 111 mg/dL — AB (ref 65–99)
Potassium: 6 mmol/L — ABNORMAL HIGH (ref 3.5–5.1)
SODIUM: 143 mmol/L (ref 135–145)
Total Bilirubin: 0.5 mg/dL (ref 0.3–1.2)
Total Protein: 4.5 g/dL — ABNORMAL LOW (ref 6.5–8.1)

## 2016-04-13 LAB — TROPONIN I
Troponin I: 0.18 ng/mL (ref <0.03)
Troponin I: 0.08 ng/mL (ref <0.03)

## 2016-04-13 LAB — GLUCOSE, CAPILLARY: GLUCOSE-CAPILLARY: 117 mg/dL — AB (ref 65–99)

## 2016-04-13 MED ORDER — MORPHINE SULFATE (PF) 4 MG/ML IV SOLN
2.0000 mg | INTRAVENOUS | Status: DC | PRN
Start: 2016-04-13 — End: 2016-04-14

## 2016-04-13 MED ORDER — ACETAMINOPHEN 650 MG RE SUPP
650.0000 mg | Freq: Four times a day (QID) | RECTAL | Status: DC | PRN
Start: 2016-04-13 — End: 2016-04-14

## 2016-04-13 MED ORDER — SODIUM CHLORIDE 0.9% FLUSH
3.0000 mL | Freq: Two times a day (BID) | INTRAVENOUS | Status: DC
  Administered 2016-04-13 – 2016-04-14 (×2): 3 mL via INTRAVENOUS

## 2016-04-13 MED ORDER — SODIUM CHLORIDE 0.45 % IV SOLN
INTRAVENOUS | Status: DC
  Administered 2016-04-13 – 2016-04-14 (×2): via INTRAVENOUS

## 2016-04-13 MED ORDER — SODIUM CHLORIDE 0.9 % IV BOLUS (SEPSIS)
1000.0000 mL | Freq: Once | INTRAVENOUS | Status: AC
  Administered 2016-04-13: 1000 mL via INTRAVENOUS

## 2016-04-13 MED ORDER — ALBUTEROL SULFATE (2.5 MG/3ML) 0.083% IN NEBU: 2.5000 mg | INHALATION_SOLUTION | RESPIRATORY_TRACT | Status: DC | PRN

## 2016-04-13 MED ORDER — BISACODYL 10 MG RE SUPP: 10.0000 mg | Freq: Every day | RECTAL | Status: DC | PRN

## 2016-04-13 MED ORDER — VANCOMYCIN HCL IN DEXTROSE 750-5 MG/150ML-% IV SOLN
750.0000 mg | Freq: Once | INTRAVENOUS | Status: AC
  Administered 2016-04-13: 750 mg via INTRAVENOUS
  Filled 2016-04-13: qty 150

## 2016-04-13 MED ORDER — ACETAMINOPHEN 325 MG PO TABS: 650.0000 mg | ORAL_TABLET | Freq: Four times a day (QID) | ORAL | Status: DC | PRN

## 2016-04-13 MED ORDER — ONDANSETRON HCL 4 MG PO TABS: 4.0000 mg | ORAL_TABLET | Freq: Four times a day (QID) | ORAL | Status: DC | PRN

## 2016-04-13 MED ORDER — INSULIN ASPART 100 UNIT/ML ~~LOC~~ SOLN
0.0000 [IU] | SUBCUTANEOUS | Status: DC
  Administered 2016-04-14: 1 [IU] via SUBCUTANEOUS
  Administered 2016-04-14 (×2): 2 [IU] via SUBCUTANEOUS
  Filled 2016-04-13: qty 2
  Filled 2016-04-13: qty 1
  Filled 2016-04-13: qty 2

## 2016-04-13 MED ORDER — ONDANSETRON HCL 4 MG/2ML IJ SOLN: 4.0000 mg | Freq: Four times a day (QID) | INTRAMUSCULAR | Status: DC | PRN

## 2016-04-13 MED ORDER — CEFTRIAXONE SODIUM-DEXTROSE 1-3.74 GM-% IV SOLR
1.0000 g | Freq: Once | INTRAVENOUS | Status: AC
  Administered 2016-04-13: 1 g via INTRAVENOUS
  Filled 2016-04-13: qty 50

## 2016-04-13 MED ORDER — VANCOMYCIN HCL IN DEXTROSE 750-5 MG/150ML-% IV SOLN
750.0000 mg | INTRAVENOUS | Status: DC
  Administered 2016-04-14: 750 mg via INTRAVENOUS
  Filled 2016-04-13 (×2): qty 150

## 2016-04-13 MED ORDER — DEXTROSE 5 % IV SOLN: 1.0000 g | Freq: Once | INTRAVENOUS | Status: DC

## 2016-04-13 MED ORDER — SODIUM CHLORIDE 0.9 % IV BOLUS (SEPSIS)
250.0000 mL | Freq: Once | INTRAVENOUS | Status: AC
  Administered 2016-04-13: 250 mL via INTRAVENOUS

## 2016-04-13 MED ORDER — CEFEPIME-DEXTROSE 2 GM/50ML IV SOLR
2.0000 g | Freq: Two times a day (BID) | INTRAVENOUS | Status: DC
  Administered 2016-04-13 – 2016-04-14 (×2): 2 g via INTRAVENOUS
  Filled 2016-04-13 (×4): qty 50

## 2016-04-13 MED ORDER — DEXTROSE 5 % IV SOLN
500.0000 mg | Freq: Once | INTRAVENOUS | Status: AC
  Administered 2016-04-13: 500 mg via INTRAVENOUS
  Filled 2016-04-13: qty 500

## 2016-04-13 NOTE — Progress Notes (Signed)
Spoke with patients daughter, [name] about IV morphine for patient.  She is adamant that no Morphine or Fentanyl  Be given to the patient.  Explained to her the importance of not furthering the patients Pneumonia by aspiration.  She verbalized understanding and feels like the patient will die if she gets morphine.  I answered questions and offered information and support.  The patient denies being in pain at this time.  She is agitated with bipap mask, but is denying pain.

## 2016-04-13 NOTE — H&P (Signed)
[facility] NAME: Sandy [name]    MR#:  [id number]  DATE OF BIRTH:  [birth year]  DATE OF ADMISSION:  04/27/2016  PRIMARY CARE PHYSICIAN: [name], MD   REQUESTING/REFERRING PHYSICIAN: Dr. Archie Balboa  CHIEF COMPLAINT:   Chief Complaint  Patient presents with  . Respiratory Distress    HISTORY OF PRESENT ILLNESS:  Sandy [name]  is a 71 y.o. female with a known history of Colon cancer with liver metastases, diabetes, hypertension, neuropathy presents to the emergency room brought by the daughter after they noticed patient has been more lethargic and coughing. Here patient has been found to have severe sepsis with blood pressure into the 70s. Hypoxic even on a nonrebreather and placed on a BiPAP. She has been getting chemotherapy for her colon cancer with liver metastases with Dr. Mike Gip. Has had progressive decline over the past few months. Recently diagnosed with pulmonary embolism and started on Eliquis.  ED physician Dr. Archie Balboa discussed with patient's daughter regarding her critical illness. She wanted IV antibiotics but was unable to make a decision as to her CODE STATUS. She did agree for a BiPAP.   PAST MEDICAL HISTORY:   Past Medical History:  Diagnosis Date  . Cancer ([place])    liver mets; colon cancer  . Constipation   . Diabetes mellitus without complication ([place])   . HOH (hard of hearing)   . Hypertension   . Insomnia   . 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 Wears dentures    partial upper and lower    PAST SURGICAL HISTORY:   Past Surgical History:  Procedure Laterality Date  . CARDIAC SURGERY    . COLONOSCOPY WITH PROPOFOL N/A 09/07/2014   Procedure: COLONOSCOPY WITH PROPOFOL;  Surgeon: Lucilla Lame, MD;  Location: [facility] ENDOSCOPY;  Service: Endoscopy;  Laterality: N/A;  . ESOPHAGOGASTRODUODENOSCOPY N/A 09/07/2014   Procedure: ESOPHAGOGASTRODUODENOSCOPY (EGD);  Surgeon: Lucilla Lame, MD;  Location: [facility] ENDOSCOPY;  Service: Endoscopy;  Laterality: N/A;  . FLEXIBLE SIGMOIDOSCOPY N/A 09/02/2015   Procedure: FLEXIBLE SIGMOIDOSCOPY;  Surgeon: Lucilla Lame, MD;  Location: [place];  Service: Endoscopy;  Laterality: N/A;  Diabetic - oral meds Pt has port-a-cath  . LIVER BIOPSY    . PORTACATH PLACEMENT Left 09/24/2014   Procedure: INSERTION PORT-A-CATH;  Surgeon: Robert Bellow, MD;  Location: [facility] ORS;  Service: General;  Laterality: Left;    SOCIAL HISTORY:   Social History  Substance Use Topics  . Smoking status: Former Smoker    Packs/day: 0.25    Years: 20.00    Types: Cigarettes    Quit date: 09/06/2013  . Smokeless tobacco: Never Used  . Alcohol use No    FAMILY HISTORY:   Family History  Problem Relation Age of Onset  . Cancer Sister     DRUG ALLERGIES:   Allergies  Allergen Reactions  . Tylenol [Acetaminophen] Nausea And Vomiting    REVIEW OF SYSTEMS:   Review of Systems  Unable to perform ROS: Mental status change    MEDICATIONS AT HOME:   Prior to Admission medications   Medication Sig Start Date End Date Taking? Authorizing Provider  amiodarone (PACERONE) 400 MG tablet Take 0.5 tablets (200 mg total) by mouth 2 (two) times daily. Patient taking differently: Take 200 mg by mouth daily.  01/05/16  Yes Dustin Flock, MD  apixaban (ELIQUIS) 5 MG TABS tablet Take 1 tablet (5 mg total) by  mouth 2 (two) times daily. 03/23/16  Yes Lequita Asal, MD  aspirin EC 81 MG tablet Take 81 mg by mouth daily. Reported on 08/26/2015   Yes Historical Provider, MD  atorvastatin (LIPITOR) 40 MG tablet Take 40 mg by mouth daily.   Yes Historical Provider, MD  gabapentin (NEURONTIN) 100 MG capsule 1 capsule every morning and 2 capsules at bedtime. Patient taking differently: Take 100 mg by mouth 2 (two) times daily.  03/19/16  Yes Lequita Asal, MD  lisinopril (PRINIVIL,ZESTRIL) 10 MG tablet Take 10 mg by mouth daily.   Yes Historical  Provider, MD  magnesium 30 MG tablet Take 30 mg by mouth daily.    Yes Historical Provider, MD  megestrol (MEGACE) 400 MG/10ML suspension Take 5 mLs (200 mg total) by mouth daily. to stimulate appetite Patient taking differently: Take 200 mg by mouth 2 (two) times daily. to stimulate appetite 11/06/14  Yes Lequita Asal, MD  metFORMIN (GLUCOPHAGE) 1000 MG tablet Take 1,000 mg by mouth 2 (two) times daily.    Yes Historical Provider, MD  Multiple Vitamin (MULTIVITAMIN WITH MINERALS) TABS tablet Take 1 tablet by mouth daily.   Yes Historical Provider, MD  oxyCODONE (OXY IR/ROXICODONE) 5 MG immediate release tablet Take 1 tablet (5 mg total) by mouth every 6 (six) hours as needed for severe pain. 03/23/16  Yes Lequita Asal, MD  regorafenib (STIVARGA) 40 MG tablet Take with low fat meal. Caution: Chemotherapy. Take 4 tablets daily for 21 days and then 7 days off 03/12/16  Yes Lequita Asal, MD  LORazepam (ATIVAN) 0.5 MG tablet Take 1 tablet (0.5 mg total) by mouth at bedtime as needed for anxiety or sleep. Patient not taking: Reported on 04/28/2016 03/03/16   Hillary Bow, MD  ondansetron (ZOFRAN) 4 MG tablet Take 1 tablet (4 mg total) by mouth every 8 (eight) hours as needed for nausea or vomiting. 12/19/15   Lequita Asal, MD     VITAL SIGNS:  Blood pressure (!) 89/76, pulse 99, temperature 97.8 F (36.6 C), temperature source Axillary, resp. rate 19, height 5\' 9"  (1.753 m), weight 61.2 kg (135 lb), SpO2 99 %.  PHYSICAL EXAMINATION:  Physical Exam  GENERAL:  71 y.o.-year-old patient lying in the bed, Looks critically ill EYES: Pupils equal, round, reactive to light and accommodation. No scleral icterus. Extraocular muscles intact.  HEENT: Head atraumatic, normocephalic. Oropharynx and nasopharynx clear. No oropharyngeal erythema, moist oral mucosa  NECK:  Supple, no jugular venous distention.  LUNGS:Bilateral wheezing CARDIOVASCULAR: S1, S2 normal. No murmurs, rubs, or  gallops.  ABDOMEN: Soft, nontender, nondistended. Bowel sounds present EXTREMITIES: No pedal edema, cyanosis, or clubbing. PSYCHIATRIC: The patient is drowzy LABORATORY PANEL:   CBC  Recent Labs Lab 05/04/2016 1824  WBC 16.5*  HGB 8.1*  HCT 25.9*  PLT 421   ------------------------------------------------------------------------------------------------------------------  Chemistries   Recent Labs Lab 04/19/2016 1824  NA 143  K 6.0*  CL 119*  CO2 18*  GLUCOSE 111*  BUN 33*  CREATININE 1.52*  CALCIUM 7.4*  AST 62*  ALT 31  ALKPHOS 220*  BILITOT 0.5   ------------------------------------------------------------------------------------------------------------------  Cardiac Enzymes  Recent Labs Lab 05/07/2016 1824  TROPONINI 0.18*   ------------------------------------------------------------------------------------------------------------------  RADIOLOGY:  Dg Chest Portable 1 View  Result Date: 05/04/2016 CLINICAL DATA:  Shortness of breath.  Labored breathing. EXAM: PORTABLE CHEST 1 VIEW COMPARISON:  02/29/2016 FINDINGS: Power port inserted from a left subclavian approach has its tip in the right atrium. Heart size is normal.  There is aortic atherosclerosis. There is new patchy density in both lower lobes consistent with mild pneumonia. Right upper lobe is clear. 1 cm density apparently within the left upper lobe was not present on recent CT and must be artifactual. No acute bone finding. IMPRESSION: Development of patchy density in both lower lobes consistent with pneumonia. Electronically Signed   By: Nelson Chimes M.D.   On: 04/26/2016 18:12     IMPRESSION AND PLAN:   * Severe sepsis secondary to bibasilar pneumonia with acute hypoxic respiratory failure and acute encephalopathy Patient's immunocompromised status with colon cancer and on chemotherapy we will treat this with vancomycin and cefepime. Send for blood cultures. Sepsis protocol for IV fluids. Repeat  lactic acid in 3 hours. Admit to ICU. BiPAP support. Consult pulmonary. Patient is critically ill. She is a DO NOT RESUSCITATE.  * Colon cancer with metastases to liver We will consult oncology to see the patient and discussed with family regarding prognosis.  * Acute kidney injury with hyperkalemia secondary to dehydration Should improve with fluid resuscitation. We will place a Foley catheter due to hemodynamic instability.  * Elevated troponin due to demand ischemia from severe sepsis and pneumonia. We'll repeat troponin.  * Worsening anemia due to chemotherapy. No need for transfusion at this time.  * DVT prophylaxis. Patient is on Eliquis with a recent diagnosis of pulmonary embolism.  All the records are reviewed and case discussed with ED provider. Management plans discussed with the patient, family and they are in agreement.  CODE STATUS: DNR  Discussed with daughter regarding patient's advanced cancer and now sepsis with pneumonia. On further discussing regarding being on a ventilator and needing resuscitation initially daughter was hesitant to make a decision. On further explaining the prognosis regarding patient's critical illness and low immunity with cancer and chemotherapy along with stage IV colon cancer daughter has agreed that patient should not be placed on a ventilator or be resuscitated. She does want other aggressive measures at this time including BiPAP support and pressors if needed and IV antibiotics. DO NOT RESUSCITATE orders placed.  TOTAL CC TIME TAKING CARE OF THIS PATIENT: 45 minutes.   Hillary Bow R M.D on 05/08/2016 at 8:13 PM  Between 7am to 6pm - Pager - [phone]  After 6pm go to [email or web address] - password [email or web address] [facility]  Office  [phone]  CC: Primary care physician; [name], MD  Note: This dictation was prepared with Dragon dictation along with smaller phrase technology. Any transcriptional errors that  result from this process are unintentional.

## 2016-04-13 NOTE — Progress Notes (Signed)
ANTIBIOTIC CONSULT NOTE - INITIAL  Pharmacy Consult for Vancomycin, Cefepime Indication: sepsis  Allergies  Allergen Reactions  . Tylenol [Acetaminophen] Nausea And Vomiting    Patient Measurements: Height: 5\' 9"  (175.3 cm) Weight: 135 lb (61.2 kg) IBW/kg (Calculated) : 66.2 Adjusted Body Weight: 64.2 kg   Vital Signs: Temp: 97.8 F (36.6 C) (01/01 1742) Temp Source: Axillary (01/01 1742) BP: 89/76 (01/01 2000) Pulse Rate: 99 (01/01 1849) Intake/Output from previous day: No intake/output data recorded. Intake/Output from this shift: No intake/output data recorded.  Labs:  Recent Labs  04/23/2016 1824  WBC 16.5*  HGB 8.1*  PLT 421  CREATININE 1.52*   Estimated Creatinine Clearance: 32.3 mL/min (by C-G formula based on SCr of 1.52 mg/dL (H)). No results for input(s): VANCOTROUGH, VANCOPEAK, VANCORANDOM, GENTTROUGH, GENTPEAK, GENTRANDOM, TOBRATROUGH, TOBRAPEAK, TOBRARND, AMIKACINPEAK, AMIKACINTROU, AMIKACIN in the last 72 hours.   Microbiology: No results found for this or any previous visit (from the past 720 hour(s)).  Medical History: Past Medical History:  Diagnosis Date  . Cancer ([place])    liver mets; colon cancer  . Constipation   . Diabetes mellitus without complication ([place])   . HOH (hard of hearing)   . Hypertension   . Insomnia   . 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 Wears dentures    partial upper and lower    Medications:   (Not in a hospital admission) Assessment: CrCl = 32.3 ml/min Ke = 0.031 hr-1 T 1/2 = 22.35 hrs Vd = 44.9 L   Goal of Therapy:  Vancomycin trough level 15-20 mcg/ml  Plan:  Expected duration 7 days with resolution of temperature and/or normalization of WBC   Cefepime 2 gm IV Q12H ordered to start on 1/1.  Vancomycin 750 mg IV X 1 ordered to be given on 1/1 @ 21:00. Vancomycin 750 mg IV Q24H ordered to start on 1/2 @ 3:00. This pt will reach Css by 1/6 @ 0900. Will draw 1st trough on 1/4 @ 0230,  which will not be at Css.   [place] Current D 04/28/2016,8:21 PM

## 2016-04-13 NOTE — ED Provider Notes (Signed)
[facility] Emergency Department Provider Note   ____________________________________________   I have reviewed the triage vital signs and the nursing notes.   HISTORY  Chief Complaint Respiratory Distress   History limited by: Altered Mental Status, history initially obtained from EMS than daughter upon her arrival   HPI Sandy [name] is a 71 y.o. female who presents to the emergency department today because of family's concerns for possible cold and congestion. The patient's daughter stated that she first noticed that her mother was having some more problems with breathing, cough congestion yesterday. The daughter is concerned that she might of given her mother something. The patient has also been having decline in mental status over the past few days to weeks. Patient has stage IV liver cancer and has been dealing with this attack declining for quite some time the patient herself is unable to give any history. Per EMS the patient's initial 02 saturation on room air was below 70. She was placed on a non rebreather by EMS.   Past Medical History:  Diagnosis Date  . Cancer ([place])    liver mets; colon cancer  . Constipation   . Diabetes mellitus without complication ([place])   . HOH (hard of hearing)   . Hypertension   . Insomnia   . 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 Wears dentures    partial upper and lower    Patient Active Problem List   Diagnosis Date Noted  . PE (pulmonary thromboembolism) ([place]) 03/01/2016  . Pulmonary embolus ([place]) 03/01/2016  . Pressure injury of skin 03/01/2016  . 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 Acute encephalopathy 12/31/2015  . Palliative care encounter   . Weakness generalized   . Poisoning by unspecified drug or medicinal substance, undetermined whether accidentally or purposely inflicted(E980.5)   . Antineoplastic chemotherapy induced pancytopenia ([place])   . Thyroid nodule  09/24/2015  . Personal history of colon cancer   . Thyroid nodule 08/30/2015  . Malignant neoplasm of sigmoid colon ([place]) 08/27/2015  . Neuropathy ([place]) 04/29/2015  . Muscle pain, lumbar 02/12/2015  . Hypomagnesemia 01/15/2015  . Colon cancer metastasized to liver ([place]) 09/18/2014  . Cancer related pain 09/18/2014  . Anemia 09/18/2014  . Anorexia 09/18/2014  . Weight loss, unintentional 09/18/2014  . Benign essential HTN 09/03/2014  . Type 2 diabetes mellitus ([place]) 09/03/2014  . Personal history of nicotine dependence 09/03/2014  . Combined fat and carbohydrate induced hyperlipemia 09/03/2014    Past Surgical History:  Procedure Laterality Date  . CARDIAC CATHETERIZATION    . COLONOSCOPY WITH PROPOFOL N/A 09/07/2014   Procedure: COLONOSCOPY WITH PROPOFOL;  Surgeon: Lucilla Lame, MD;  Location: [facility] ENDOSCOPY;  Service: Endoscopy;  Laterality: N/A;  . ESOPHAGOGASTRODUODENOSCOPY N/A 09/07/2014   Procedure: ESOPHAGOGASTRODUODENOSCOPY (EGD);  Surgeon: Lucilla Lame, MD;  Location[facility] ENDOSCOPY;  Service: Endoscopy;  Laterality: N/A;  . FLEXIBLE SIGMOIDOSCOPY N/A 09/02/2015   Procedure: FLEXIBLE SIGMOIDOSCOPY;  Surgeon: Lucilla Lame, MD;  Location: [place];  Service: Endoscopy;  Laterality: N/A;  Diabetic - oral meds Pt has port-a-cath  . LIVER BIOPSY    . PORTACATH PLACEMENT Left 09/24/2014   Procedure: INSERTION PORT-A-CATH;  Surgeon: Robert Bellow, MD;  Location: [facility] ORS;  Service: General;  Laterality: Left;    Prior to Admission medications   Medication Sig Start Date End Date Taking? Authorizing Provider  amiodarone (PACERONE) 400 MG tablet Take 0.5 tablets (200 mg total) by mouth 2 (  two) times daily. Patient taking differently: Take 400 mg by mouth daily.  01/05/16   Dustin Flock, MD  apixaban (ELIQUIS) 5 MG TABS tablet Take 1 tablet (5 mg total) by mouth 2 (two) times daily. 03/23/16   Lequita Asal, MD  aspirin EC 81 MG tablet Take 81 mg by mouth daily.  Reported on 08/26/2015    Historical Provider, MD  atorvastatin (LIPITOR) 40 MG tablet Take 40 mg by mouth daily.    Historical Provider, MD  gabapentin (NEURONTIN) 100 MG capsule 1 capsule every morning and 2 capsules at bedtime. 03/19/16   Lequita Asal, MD  lisinopril (PRINIVIL,ZESTRIL) 10 MG tablet Take 10 mg by mouth daily.    Historical Provider, MD  LORazepam (ATIVAN) 0.5 MG tablet Take 1 tablet (0.5 mg total) by mouth at bedtime as needed for anxiety or sleep. 03/03/16   Hillary Bow, MD  magnesium 30 MG tablet Take 30 mg by mouth daily.     Historical Provider, MD  megestrol (MEGACE) 400 MG/10ML suspension Take 5 mLs (200 mg total) by mouth daily. to stimulate appetite Patient taking differently: Take 200 mg by mouth 2 (two) times daily. to stimulate appetite 11/06/14   Lequita Asal, MD  metFORMIN (GLUCOPHAGE) 1000 MG tablet Take 1,000 mg by mouth 2 (two) times daily.     Historical Provider, MD  Multiple Vitamin (MULTIVITAMIN WITH MINERALS) TABS tablet Take 1 tablet by mouth daily.    Historical Provider, MD  ondansetron (ZOFRAN) 4 MG tablet Take 1 tablet (4 mg total) by mouth every 8 (eight) hours as needed for nausea or vomiting. 12/19/15   Lequita Asal, MD  oxyCODONE (OXY IR/ROXICODONE) 5 MG immediate release tablet Take 1 tablet (5 mg total) by mouth every 6 (six) hours as needed for severe pain. 03/23/16   Lequita Asal, MD  regorafenib (STIVARGA) 40 MG tablet Take with low fat meal. Caution: Chemotherapy. Take 4 tablets daily for 21 days and then 7 days off 03/12/16   Lequita Asal, MD    Allergies Tylenol [acetaminophen]  Family History  Problem Relation Age of Onset  . Cancer Sister     Social History Social History  Substance Use Topics  . Smoking status: Former Smoker    Packs/day: 0.25    Years: 20.00    Types: Cigarettes    Quit date: 09/06/2013  . Smokeless tobacco: Never Used  . Alcohol use No    Review of Systems Unable to obtain  secondary to altered mental status  ____________________________________________   PHYSICAL EXAM:  VITAL SIGNS: ED Triage Vitals [04/29/2016 1742]  Enc Vitals Group     BP (!) 88/48     Pulse Rate (!) 106     Resp 12     Temp 97.8 F (36.6 C)     Temp Source Axillary     SpO2 (!) 72 %     Weight     Constitutional: Awake, minimally alert. Chronically ill appearing. Cachectic.  Eyes: Conjunctivae are normal. Normal extraocular movements. ENT   Head: Normocephalic and atraumatic.   Nose: No congestion/rhinnorhea.   Mouth/Throat: Mucous membranes are moist.   Neck: No stridor. Hematological/Lymphatic/Immunilogical: No cervical lymphadenopathy. Cardiovascular: Tachycardic, regular rhythm.  No murmurs, rubs, or gallops.  Respiratory: Increased respiratory effort. On non rebreather. Diffuse rhonchi.  Gastrointestinal: Soft and non tender. No rebound. No guarding.  Genitourinary: Deferred Musculoskeletal: Normal range of motion in all extremities. No lower extremity edema. Neurologic:  Minimally alert. Non verbal,  will moan. Appears to move all extremities.  Skin:  Skin is warm, dry and intact. No rash noted.  ____________________________________________    LABS (pertinent positives/negatives)  Labs Reviewed  LACTIC ACID, PLASMA - Abnormal; Notable for the following:       Result Value   Lactic Acid, Venous 2.2 (*)    All other components within normal limits  LACTIC ACID, PLASMA - Abnormal; Notable for the following:    Lactic Acid, Venous 2.7 (*)    All other components within normal limits  COMPREHENSIVE METABOLIC PANEL - Abnormal; Notable for the following:    Potassium 6.0 (*)    Chloride 119 (*)    CO2 18 (*)    Glucose, Bld 111 (*)    BUN 33 (*)    Creatinine, Ser 1.52 (*)    Calcium 7.4 (*)    Total Protein 4.5 (*)    Albumin 1.1 (*)    AST 62 (*)    Alkaline Phosphatase 220 (*)    GFR calc non Af Amer 33 (*)    GFR calc Af Amer 38 (*)    All  other components within normal limits  TROPONIN I - Abnormal; Notable for the following:    Troponin I 0.18 (*)    All other components within normal limits  CBC WITH DIFFERENTIAL/PLATELET - Abnormal; Notable for the following:    WBC 16.5 (*)    RBC 2.71 (*)    Hemoglobin 8.1 (*)    HCT 25.9 (*)    MCHC 31.3 (*)    RDW 22.3 (*)    Neutro Abs 15.6 (*)    Lymphs Abs 0.5 (*)    All other components within normal limits  TROPONIN I - Abnormal; Notable for the following:    Troponin I 0.08 (*)    All other components within normal limits  GLUCOSE, CAPILLARY - Abnormal; Notable for the following:    Glucose-Capillary 117 (*)    All other components within normal limits  CULTURE, BLOOD (ROUTINE X 2)  CULTURE, BLOOD (ROUTINE X 2)  URINE CULTURE  COMPREHENSIVE METABOLIC PANEL  CBC  TROPONIN I     ____________________________________________   EKG  I, Nance Pear, attending physician, personally viewed and interpreted this EKG  EKG Time: 1741 Rate: 106 Rhythm: sinus tachycardia Axis: normal Intervals: qtc 423 QRS: narrow ST changes: no st elevation Impression: abnormal ekg   ____________________________________________    RADIOLOGY  CXR IMPRESSION: Development of patchy density in both lower lobes consistent with pneumonia.  ____________________________________________   PROCEDURES  Procedures  CRITICAL CARE Performed by: Nance Pear   Total critical care time: 35 minutes  Critical care time was exclusive of separately billable procedures and treating other patients.  Critical care was necessary to treat or prevent imminent or life-threatening deterioration.  Critical care was time spent personally by me on the following activities: development of treatment plan with patient and/or surrogate as well as nursing, discussions with consultants, evaluation of patient's response to treatment, examination of patient, obtaining history from patient or  surrogate, ordering and performing treatments and interventions, ordering and review of laboratory studies, ordering and review of radiographic studies, pulse oximetry and re-evaluation of patient's condition.  ____________________________________________   INITIAL IMPRESSION / ASSESSMENT AND PLAN / ED COURSE  Pertinent labs & imaging results that were available during my care of the patient were reviewed by me and considered in my medical decision making (see chart for details).  Patient presented to the emergency department  today via EMS because of concerns for respiratory issues. On exam patient was in some respiratory distress. She was placed on BiPAP and did tolerate this well. This did help the patient's O2 saturation. Workup did show findings concerning for pneumonia. I do think patient is likely septic from the pneumonia. Patient was put on broad-spectrum antibiotics. She will be admitted to the hospital service. I had a discussion with the family about close care. At this point they seemed only interested in medications.  ____________________________________________   FINAL CLINICAL IMPRESSION(S) / ED DIAGNOSES  Final diagnoses:  Community acquired pneumonia, unspecified laterality  Sepsis, due to unspecified organism [facility])     Note: This dictation was prepared with Sales executive. Any transcriptional errors that result from this process are unintentional     Nance Pear, MD 04/27/2016 2317

## 2016-04-13 NOTE — ED Triage Notes (Addendum)
Per [facility]: Pt. From home, stage IV liver CA, family concerned of giving pt cold, requesting full tx w/o access port, currently taking PO Chemo.

## 2016-04-14 ENCOUNTER — Inpatient Hospital Stay: Payer: Medicare Other

## 2016-04-14 DIAGNOSIS — E872 Acidosis: Secondary | ICD-10-CM

## 2016-04-14 DIAGNOSIS — A419 Sepsis, unspecified organism: Principal | ICD-10-CM

## 2016-04-14 DIAGNOSIS — R6521 Severe sepsis with septic shock: Secondary | ICD-10-CM

## 2016-04-14 LAB — COMPREHENSIVE METABOLIC PANEL
ALT: 32 U/L (ref 14–54)
AST: 92 U/L — AB (ref 15–41)
Alkaline Phosphatase: 199 U/L — ABNORMAL HIGH (ref 38–126)
Anion gap: 10 (ref 5–15)
BUN: 31 mg/dL — AB (ref 6–20)
CHLORIDE: 125 mmol/L — AB (ref 101–111)
CO2: 11 mmol/L — AB (ref 22–32)
CREATININE: 1.65 mg/dL — AB (ref 0.44–1.00)
Calcium: 7.1 mg/dL — ABNORMAL LOW (ref 8.9–10.3)
GFR calc Af Amer: 35 mL/min — ABNORMAL LOW (ref >60)
GFR calc non Af Amer: 30 mL/min — ABNORMAL LOW (ref >60)
GLUCOSE: 128 mg/dL — AB (ref 65–99)
POTASSIUM: 6 mmol/L — AB (ref 3.5–5.1)
Sodium: 146 mmol/L — ABNORMAL HIGH (ref 135–145)
Total Bilirubin: 0.5 mg/dL (ref 0.3–1.2)
Total Protein: 3.8 g/dL — ABNORMAL LOW (ref 6.5–8.1)

## 2016-04-14 LAB — GLUCOSE, CAPILLARY
GLUCOSE-CAPILLARY: 117 mg/dL — AB (ref 65–99)
Glucose-Capillary: 127 mg/dL — ABNORMAL HIGH (ref 65–99)
Glucose-Capillary: 110 mg/dL — ABNORMAL HIGH (ref 65–99)
Glucose-Capillary: 168 mg/dL — ABNORMAL HIGH (ref 65–99)
Glucose-Capillary: 102 mg/dL — ABNORMAL HIGH (ref 65–99)

## 2016-04-14 LAB — BLOOD GAS, ARTERIAL
ACID-BASE DEFICIT: 20.4 mmol/L — AB (ref 0.0–2.0)
Allens test (pass/fail): POSITIVE — AB
Bicarbonate: 8 mmol/L — ABNORMAL LOW (ref 20.0–28.0)
DELIVERY SYSTEMS: POSITIVE
Expiratory PAP: 8
FIO2: 1
INSPIRATORY PAP: 14
Mechanical Rate: 8
O2 SAT: 96 %
PCO2 ART: 27 mmHg — AB (ref 32.0–48.0)
PH ART: 7.08 — AB (ref 7.350–7.450)
Patient temperature: 37
pO2, Arterial: 111 mmHg — ABNORMAL HIGH (ref 83.0–108.0)

## 2016-04-14 LAB — TROPONIN I: Troponin I: 0.11 ng/mL (ref <0.03)

## 2016-04-14 LAB — CBC
HEMATOCRIT: 28 % — AB (ref 35.0–47.0)
Hemoglobin: 8.3 g/dL — ABNORMAL LOW (ref 12.0–16.0)
MCH: 30.5 pg (ref 26.0–34.0)
MCHC: 29.8 g/dL — AB (ref 32.0–36.0)
MCV: 102.4 fL — AB (ref 80.0–100.0)
Platelets: 437 10*3/uL (ref 150–440)
RBC: 2.74 MIL/uL — ABNORMAL LOW (ref 3.80–5.20)
RDW: 22.8 % — AB (ref 11.5–14.5)
WBC: 9.6 10*3/uL (ref 3.6–11.0)

## 2016-04-14 LAB — MRSA PCR SCREENING: MRSA BY PCR: NEGATIVE

## 2016-04-14 LAB — PROTIME-INR: Prothrombin Time: >90 seconds — ABNORMAL HIGH (ref 11.4–15.2)

## 2016-04-14 LAB — APTT: aPTT: 83 seconds — ABNORMAL HIGH (ref 24–36)

## 2016-04-14 LAB — HEPARIN LEVEL (UNFRACTIONATED): Heparin Unfractionated: >3.6 IU/mL — ABNORMAL HIGH (ref 0.30–0.70)

## 2016-04-14 MED ORDER — SODIUM CHLORIDE 0.9 % IV BOLUS (SEPSIS)
1000.0000 mL | Freq: Once | INTRAVENOUS | Status: AC
  Administered 2016-04-14: 1000 mL via INTRAVENOUS

## 2016-04-14 MED ORDER — DEXTROSE 5 % IV SOLN
INTRAVENOUS | Status: DC
Start: 2016-04-14 — End: 2016-04-14
  Administered 2016-04-14: 11:00:00 via INTRAVENOUS
  Filled 2016-04-14: qty 150

## 2016-04-14 MED ORDER — MORPHINE SULFATE (CONCENTRATE) 10 MG/0.5ML PO SOLN: 10.0000 mg | ORAL | Status: DC | PRN

## 2016-04-14 MED ORDER — LORAZEPAM 2 MG/ML IJ SOLN: 1.0000 mg | INTRAMUSCULAR | Status: DC | PRN

## 2016-04-14 MED ORDER — NOREPINEPHRINE BITARTRATE 1 MG/ML IV SOLN
0.0000 ug/min | INTRAVENOUS | Status: DC
  Administered 2016-04-14: 5 ug/min via INTRAVENOUS
  Filled 2016-04-14: qty 16

## 2016-04-14 MED ORDER — NOREPINEPHRINE BITARTRATE 1 MG/ML IV SOLN
0.0000 ug/min | INTRAVENOUS | Status: DC
  Administered 2016-04-14: 7 ug/min via INTRAVENOUS
  Filled 2016-04-14: qty 4

## 2016-04-14 MED ORDER — DEXTROSE 50 % IV SOLN
25.0000 mL | Freq: Once | INTRAVENOUS | Status: AC
  Administered 2016-04-14: 25 mL via INTRAVENOUS
  Filled 2016-04-14: qty 50

## 2016-04-14 MED ORDER — HEPARIN BOLUS VIA INFUSION
3500.0000 [IU] | Freq: Once | INTRAVENOUS | Status: DC
  Filled 2016-04-14: qty 3500

## 2016-04-14 MED ORDER — HEPARIN (PORCINE) IN NACL 100-0.45 UNIT/ML-% IJ SOLN
1000.0000 [IU]/h | INTRAMUSCULAR | Status: DC
  Administered 2016-04-14: 1000 [IU]/h via INTRAVENOUS
  Filled 2016-04-14: qty 250

## 2016-04-14 MED ORDER — INSULIN ASPART 100 UNIT/ML IV SOLN
10.0000 [IU] | Freq: Once | INTRAVENOUS | Status: AC
  Administered 2016-04-14: 10 [IU] via INTRAVENOUS
  Filled 2016-04-14: qty 0.1

## 2016-04-14 MED ORDER — MORPHINE SULFATE (PF) 4 MG/ML IV SOLN
2.0000 mg | INTRAVENOUS | Status: DC | PRN
  Administered 2016-04-14: 2 mg via INTRAVENOUS
  Filled 2016-04-14: qty 1

## 2016-04-14 MED ORDER — GLYCOPYRROLATE 0.2 MG/ML IJ SOLN
0.1000 mg | Freq: Four times a day (QID) | INTRAMUSCULAR | Status: DC | PRN
  Administered 2016-04-14: 0.1 mg via INTRAVENOUS
  Filled 2016-04-14 (×2): qty 1

## 2016-04-14 MED ORDER — SODIUM BICARBONATE 8.4 % IV SOLN
200.0000 meq | Freq: Once | INTRAVENOUS | Status: AC
  Administered 2016-04-14: 200 meq via INTRAVENOUS
  Filled 2016-04-14: qty 200

## 2016-04-14 MED ORDER — SODIUM BICARBONATE 8.4 % IV SOLN: 100.0000 meq | Freq: Once | INTRAVENOUS | Status: DC

## 2016-04-14 MED ORDER — SODIUM CHLORIDE 0.9 % IV BOLUS (SEPSIS)
500.0000 mL | Freq: Once | INTRAVENOUS | Status: AC
  Administered 2016-04-14: 500 mL via INTRAVENOUS

## 2016-04-14 MED ORDER — PANTOPRAZOLE SODIUM 40 MG IV SOLR
40.0000 mg | INTRAVENOUS | Status: DC
  Administered 2016-04-14: 40 mg via INTRAVENOUS
  Filled 2016-04-14: qty 40

## 2016-04-15 NOTE — Consult Note (Signed)
[facility]  Date of admission:  04/20/2016  Inpatient day:  01-May-2016  Consulting physician: Dr. Hillary Bow  Reason for Consultation:  Metastatic colon cancer, prognosis.  Chief Complaint: Sandy [name] is a 71 y.o. female with metastatic colon cancer who was admitted to the ICU with sepsis.  HPI:  The patient was last admitted to [facility] from 02/29/2016 - 03/03/2016 for a pulmonary embolism.  She was started on Eliqus.  At that time, she had progressive metastatic colon cancer.  She wished to try 4th line therapy, Stivarga.  The patient has not been seen in follow-up in clinic secondary to an ongoing decline in health.  Per her daughter, she has been bedridden.  She has been drinking some fluids and eating soups.  Recently she had had significant ostomy output.  She last spoke with her mother 2 days ago.  Her daughter called 911 secondary to respiratory distress.  Currently she is in the ICU on Levophed at increasing doses.  Systolic blood pressure is 75.  She has received fluid boluses.  She has moderate peripheral edema.  ABG this AM revealed metabolic acidosis with a pH of 7.06.  She has received bicarbonate bolus and is on continuous IV bicarbonate drip.  She is unresponsive on BIPAP.  She is on broad spectrum antibiotics.     Past Medical History:  Diagnosis Date  . Cancer ([place])    liver mets; colon cancer  . Constipation   . Diabetes mellitus without complication ([place])   . HOH (hard of hearing)   . Hypertension   . Insomnia   . 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 Wears dentures    partial upper and lower    Past Surgical History:  Procedure Laterality Date  . CARDIAC SURGERY    . COLONOSCOPY WITH PROPOFOL N/A 09/07/2014   Procedure: COLONOSCOPY WITH PROPOFOL;  Surgeon: Lucilla Lame, MD;  Location: [facility] ENDOSCOPY;  Service: Endoscopy;  Laterality: N/A;  . ESOPHAGOGASTRODUODENOSCOPY N/A 09/07/2014   Procedure:  ESOPHAGOGASTRODUODENOSCOPY (EGD);  Surgeon: Lucilla Lame, MD;  Location: [facility] ENDOSCOPY;  Service: Endoscopy;  Laterality: N/A;  . FLEXIBLE SIGMOIDOSCOPY N/A 09/02/2015   Procedure: FLEXIBLE SIGMOIDOSCOPY;  Surgeon: Lucilla Lame, MD;  Location: [facility];  Service: Endoscopy;  Laterality: N/A;  Diabetic - oral meds Pt has port-a-cath  . LIVER BIOPSY    . PORTACATH PLACEMENT Left 09/24/2014   Procedure: INSERTION PORT-A-CATH;  Surgeon: Robert Bellow, MD;  Location: [facility] ORS;  Service: General;  Laterality: Left;    Family History  Problem Relation Age of Onset  . Cancer Sister     Social History:  reports that she quit smoking about 2 years ago. Her smoking use included Cigarettes. She has a 5.00 pack-year smoking history. She has never used smokeless tobacco. She reports that she does not drink alcohol or use drugs.  The patient's nurse and daughter, [name], is at her bedside.  Allergies:  Allergies  Allergen Reactions  . Tylenol [Acetaminophen] Nausea And Vomiting    No prescriptions prior to admission.    Review of Systems: GENERAL:  Bedridden.  No fever at home.  Ongoing weight loss. PERFORMANCE STATUS (ECOG):  4 HEENT:  No visual changes, runny nose, sore throat, mouth sores or tenderness. Lungs: Shortness of breath at home.  No hemoptysis. Cardiac:  No apparent chest pain. GI:  Minimal oral intake.  Recent diarrhea.  No nausea, vomiting,melena or hematochezia. GU:  Decreased urine  output at home.  Musculoskeletal:  Unable to assess.. Extremities:  Swelling. Skin:  Breakdown on sacrum. Neuro:  Unresponsive.  Bedridden.. Endocrine:  No diabetes, thyroid issues, hot flashes or night sweats. Psych:  Unable to assess.. Pain:  No apparent pain. Review of systems:  All other systems reviewed and found to be negative.  Physical Exam:  Blood pressure (!) 75/53, pulse 83, temperature 98.2 F (36.8 C), temperature source Rectal, resp. rate 19, height 5' 9"  (1.753 m),  weight 138 lb 14.2 oz (63 kg), SpO2 91 %.  GENERAL:  Pale edematous unresponsive woman lying under a warming blanket in bed in the ICU. MENTAL STATUS:  Unrespossive. HEAD:  Normocephalic, atraumatic, face symmetric, no Cushingoid features. EYES:  Brown/green eyes.  Pupils equal round and slowly reactive to light and accomodation.  No conjunctivitis or scleral icterus. ENT:  BIPAP in place.  Oropharynx clear without lesion.  Tongue normal. Mucous membranes moist.  RESPIRATORY:  Coarse bilateral breath sounds. CARDIOVASCULAR:  Regular rate and rhythm without murmur, rub or gallop. ABDOMEN:  Soft, non-tender, with decreased bowel sounds, and no hepatosplenomegaly.  No masses. SKIN:  Large foul smelling sacral decubitus ulcer.  Skin breakdown on thighs and ankles. EXTREMITIES: Edema.  No palpable cords. LYMPH NODES: No palpable cervical, supraclavicular, axillary or inguinal adenopathy  NEUROLOGICAL: Unresposive.   Results for orders placed or performed during the hospital encounter of 04/23/2016 (from the past 48 hour(s))  Lactic acid, plasma     Status: Abnormal   Collection Time: 04/26/2016  6:24 PM  Result Value Ref Range   Lactic Acid, Venous 2.2 (HH) 0.5 - 1.9 mmol/L    Comment: CRITICAL RESULT CALLED TO, READ BACK BY AND VERIFIED WITH ALLISON PATE RN AT 1910 05/05/2016 MSS.   Comprehensive metabolic panel     Status: Abnormal   Collection Time: 05/13/2016  6:24 PM  Result Value Ref Range   Sodium 143 135 - 145 mmol/L   Potassium 6.0 (H) 3.5 - 5.1 mmol/L   Chloride 119 (H) 101 - 111 mmol/L   CO2 18 (L) 22 - 32 mmol/L   Glucose, Bld 111 (H) 65 - 99 mg/dL   BUN 33 (H) 6 - 20 mg/dL   Creatinine, Ser 1.52 (H) 0.44 - 1.00 mg/dL   Calcium 7.4 (L) 8.9 - 10.3 mg/dL   Total Protein 4.5 (L) 6.5 - 8.1 g/dL   Albumin 1.1 (L) 3.5 - 5.0 g/dL   AST 62 (H) 15 - 41 U/L   ALT 31 14 - 54 U/L   Alkaline Phosphatase 220 (H) 38 - 126 U/L   Total Bilirubin 0.5 0.3 - 1.2 mg/dL   GFR calc non Af Amer 33 (L)  >60 mL/min   GFR calc Af Amer 38 (L) >60 mL/min    Comment: (NOTE) The eGFR has been calculated using the CKD EPI equation. This calculation has not been validated in all clinical situations. eGFR's persistently <60 mL/min signify possible Chronic Kidney Disease.    Anion gap 6 5 - 15  Troponin I     Status: Abnormal   Collection Time: 04/27/2016  6:24 PM  Result Value Ref Range   Troponin I 0.18 (HH) <0.03 ng/mL    Comment: CRITICAL RESULT CALLED TO, READ BACK BY AND VERIFIED WITH ALLISON PATE RN AT 1910 04/28/2016 MSS.   CBC WITH DIFFERENTIAL     Status: Abnormal   Collection Time: 04/17/2016  6:24 PM  Result Value Ref Range   WBC 16.5 (H) 3.6 - 11.0  K/uL   RBC 2.71 (L) 3.80 - 5.20 MIL/uL   Hemoglobin 8.1 (L) 12.0 - 16.0 g/dL   HCT 25.9 (L) 35.0 - 47.0 %   MCV 95.4 80.0 - 100.0 fL   MCH 29.9 26.0 - 34.0 pg   MCHC 31.3 (L) 32.0 - 36.0 g/dL   RDW 22.3 (H) 11.5 - 14.5 %   Platelets 421 150 - 440 K/uL   Neutrophils Relative % 94 %   Neutro Abs 15.6 (H) 1.4 - 6.5 K/uL   Lymphocytes Relative 3 %   Lymphs Abs 0.5 (L) 1.0 - 3.6 K/uL   Monocytes Relative 2 %   Monocytes Absolute 0.3 0.2 - 0.9 K/uL   Eosinophils Relative 1 %   Eosinophils Absolute 0.1 0 - 0.7 K/uL   Basophils Relative 0 %   Basophils Absolute 0.0 0 - 0.1 K/uL  Blood Culture (routine x 2)     Status: None (Preliminary result)   Collection Time: 04/15/2016  6:24 PM  Result Value Ref Range   Specimen Description BLOOD PORT    Special Requests      BOTTLES DRAWN AEROBIC AND ANAEROBIC AER 10ML ANA 11ML   Culture NO GROWTH < 12 HOURS    Report Status PENDING   Lactic acid, plasma     Status: Abnormal   Collection Time: 04/20/2016 10:27 PM  Result Value Ref Range   Lactic Acid, Venous 2.7 (HH) 0.5 - 1.9 mmol/L    Comment: CRITICAL RESULT CALLED TO, READ BACK BY AND VERIFIED WITH [facility] RN AT 2310 04/17/2016 MSS.   Troponin I (q 6hr x 3)     Status: Abnormal   Collection Time: 05/06/2016 10:27 PM  Result Value Ref Range    Troponin I 0.08 (HH) <0.03 ng/mL    Comment: CRITICAL VALUE NOTED. VALUE IS CONSISTENT WITH PREVIOUSLY REPORTED/CALLED VALUE.MSS  Glucose, capillary     Status: Abnormal   Collection Time: 05/02/2016 10:47 PM  Result Value Ref Range   Glucose-Capillary 117 (H) 65 - 99 mg/dL  Glucose, capillary     Status: Abnormal   Collection Time: 05/11/2016  5:58 AM  Result Value Ref Range   Glucose-Capillary 127 (H) 65 - 99 mg/dL   Comment 1 Notify RN   Comprehensive metabolic panel     Status: Abnormal   Collection Time: 2016/05/11  6:01 AM  Result Value Ref Range   Sodium 146 (H) 135 - 145 mmol/L   Potassium 6.0 (H) 3.5 - 5.1 mmol/L   Chloride 125 (H) 101 - 111 mmol/L   CO2 11 (L) 22 - 32 mmol/L   Glucose, Bld 128 (H) 65 - 99 mg/dL   BUN 31 (H) 6 - 20 mg/dL   Creatinine, Ser 1.65 (H) 0.44 - 1.00 mg/dL   Calcium 7.1 (L) 8.9 - 10.3 mg/dL   Total Protein 3.8 (L) 6.5 - 8.1 g/dL   Albumin <1.0 (L) 3.5 - 5.0 g/dL   AST 92 (H) 15 - 41 U/L   ALT 32 14 - 54 U/L   Alkaline Phosphatase 199 (H) 38 - 126 U/L   Total Bilirubin 0.5 0.3 - 1.2 mg/dL   GFR calc non Af Amer 30 (L) >60 mL/min   GFR calc Af Amer 35 (L) >60 mL/min    Comment: (NOTE) The eGFR has been calculated using the CKD EPI equation. This calculation has not been validated in all clinical situations. eGFR's persistently <60 mL/min signify possible Chronic Kidney Disease.    Anion gap 10 5 -  15  CBC     Status: Abnormal   Collection Time: 04/15/16  6:01 AM  Result Value Ref Range   WBC 9.6 3.6 - 11.0 K/uL   RBC 2.74 (L) 3.80 - 5.20 MIL/uL   Hemoglobin 8.3 (L) 12.0 - 16.0 g/dL   HCT 28.0 (L) 35.0 - 47.0 %   MCV 102.4 (H) 80.0 - 100.0 fL    Comment: RESULT REPEATED AND VERIFIED   MCH 30.5 26.0 - 34.0 pg   MCHC 29.8 (L) 32.0 - 36.0 g/dL   RDW 22.8 (H) 11.5 - 14.5 %   Platelets 437 150 - 440 K/uL  Troponin I (q 6hr x 3)     Status: Abnormal   Collection Time: 04/15/2016  6:01 AM  Result Value Ref Range   Troponin I 0.11 (HH) <0.03  ng/mL    Comment: CRITICAL VALUE NOTED. VALUE IS CONSISTENT WITH PREVIOUSLY REPORTED/CALLED VALUE JLJ  MRSA PCR Screening     Status: None   Collection Time: Apr 15, 2016  6:14 AM  Result Value Ref Range   MRSA by PCR NEGATIVE NEGATIVE    Comment:        The GeneXpert MRSA Assay (FDA approved for NASAL specimens only), is one component of a comprehensive MRSA colonization surveillance program. It is not intended to diagnose MRSA infection nor to guide or monitor treatment for MRSA infections.   Glucose, capillary     Status: Abnormal   Collection Time: 04/15/16  7:21 AM  Result Value Ref Range   Glucose-Capillary 117 (H) 65 - 99 mg/dL  Blood gas, arterial     Status: Abnormal   Collection Time: 2016/04/15  9:11 AM  Result Value Ref Range   FIO2 1.00    Delivery systems BILEVEL POSITIVE AIRWAY PRESSURE    Inspiratory PAP 14    Expiratory PAP 8    pH, Arterial 7.08 (LL) 7.350 - 7.450    Comment: CRITICAL RESULT CALLED TO, READ BACK BY AND VERIFIED WITH: NOTIFIED DR. RAM AT 5329 ON 04-15-16 BY GKM    pCO2 arterial 27 (L) 32.0 - 48.0 mmHg   pO2, Arterial 111 (H) 83.0 - 108.0 mmHg   Bicarbonate 8.0 (L) 20.0 - 28.0 mmol/L   Acid-base deficit 20.4 (H) 0.0 - 2.0 mmol/L   O2 Saturation 96.0 %   Patient temperature 37.0    Collection site RIGHT RADIAL    Sample type ARTERIAL DRAW    Allens test (pass/fail) POSITIVE (A) PASS   Mechanical Rate 8   Glucose, capillary     Status: Abnormal   Collection Time: 2016/04/15 11:26 AM  Result Value Ref Range   Glucose-Capillary 110 (H) 65 - 99 mg/dL  Glucose, capillary     Status: Abnormal   Collection Time: 2016-04-15 11:29 AM  Result Value Ref Range   Glucose-Capillary 168 (H) 65 - 99 mg/dL  APTT     Status: Abnormal   Collection Time: 2016-04-15  1:53 PM  Result Value Ref Range   aPTT 83 (H) 24 - 36 seconds    Comment:        IF BASELINE aPTT IS ELEVATED, SUGGEST PATIENT RISK ASSESSMENT BE USED TO DETERMINE APPROPRIATE ANTICOAGULANT  THERAPY.   Heparin level (unfractionated)     Status: Abnormal   Collection Time: 04/15/16  1:54 PM  Result Value Ref Range   Heparin Unfractionated >3.60 (H) 0.30 - 0.70 IU/mL    Comment: RESULTS CONFIRMED BY MANUAL DILUTION  Protime-INR     Status: Abnormal  Collection Time: 04/24/2016  1:54 PM  Result Value Ref Range   Prothrombin Time >90.0 (H) 11.4 - 15.2 seconds    Comment: CRITICAL RESULT CALLED TO, READ BACK BY AND VERIFIED WITH: CAROLINE SOGOMO April 24, 2016 @ 1515  [place]    INR >11.00 (HH)     Comment: CRITICAL RESULT CALLED TO, READ BACK BY AND VERIFIED WITH: CAROLINE SOGOMO 2016-04-24 @ 1545  MLK   Glucose, capillary     Status: Abnormal   Collection Time: 2016-04-24  6:01 PM  Result Value Ref Range   Glucose-Capillary 102 (H) 65 - 99 mg/dL   Dg Chest Portable 1 View  Result Date: 04/23/2016 CLINICAL DATA:  Shortness of breath.  Labored breathing. EXAM: PORTABLE CHEST 1 VIEW COMPARISON:  02/29/2016 FINDINGS: Power port inserted from a left subclavian approach has its tip in the right atrium. Heart size is normal. There is aortic atherosclerosis. There is new patchy density in both lower lobes consistent with mild pneumonia. Right upper lobe is clear. 1 cm density apparently within the left upper lobe was not present on recent CT and must be artifactual. No acute bone finding. IMPRESSION: Development of patchy density in both lower lobes consistent with pneumonia. Electronically Signed   By: Nelson Chimes M.D.   On: 04/21/2016 18:12    Assessment:  The patient is a 71 y.o. woman with progressive metastatic colon cancer admitted to the ICU with sepsis.  She is acidotic and hypotensive on increasing doses of Levophed (currently 20 mcg).  She is on broad spectrum antibiotics.  Code status is DNR.  Plan:   1.  Oncology:  Discussed current clinic situation with patient's daughter at length.  She realizes that her mother is critically ill.  She stated that she didn't want her mother to suffer.  I  discussed that she was actively dying.  Despite our aggressive care, we could not reverse the current situation.  Without current support, she would not be with [place].  I stated that I felt that she would likely die today. She was very tearful as she felt that she had days/weeks to live.  She hugged me and thanked me for everything that we had done to extend her life to this point.  She left the room tearful.  2.  Disposition:  Critically ill.  Continue aggressive support.  Code status DNR.   Thank you for allowing me to participate in Sandy [name] 's care.  I will follow her closely with you while hospitalized.  Lequita Asal, MD  04-24-2016

## 2016-04-16 LAB — BLOOD CULTURE ID PANEL (REFLEXED)
Acinetobacter baumannii: NOT DETECTED
CANDIDA GLABRATA: NOT DETECTED
CANDIDA KRUSEI: NOT DETECTED
CANDIDA TROPICALIS: NOT DETECTED
Candida albicans: NOT DETECTED
Candida parapsilosis: NOT DETECTED
ENTEROBACTER CLOACAE COMPLEX: NOT DETECTED
ESCHERICHIA COLI: NOT DETECTED
Enterobacteriaceae species: NOT DETECTED
Enterococcus species: DETECTED — AB
Haemophilus influenzae: NOT DETECTED
KLEBSIELLA PNEUMONIAE: NOT DETECTED
Klebsiella oxytoca: NOT DETECTED
LISTERIA MONOCYTOGENES: NOT DETECTED
NEISSERIA MENINGITIDIS: NOT DETECTED
Proteus species: NOT DETECTED
Pseudomonas aeruginosa: NOT DETECTED
STAPHYLOCOCCUS SPECIES: NOT DETECTED
Serratia marcescens: NOT DETECTED
Staphylococcus aureus (BCID): NOT DETECTED
Streptococcus agalactiae: NOT DETECTED
Streptococcus pneumoniae: NOT DETECTED
Streptococcus pyogenes: NOT DETECTED
Streptococcus species: NOT DETECTED
Vancomycin resistance: NOT DETECTED

## 2016-04-17 ENCOUNTER — Other Ambulatory Visit: Payer: Self-pay | Admitting: Nurse Practitioner

## 2016-04-18 LAB — CULTURE, BLOOD (ROUTINE X 2)

## 2016-04-19 LAB — CULTURE, BLOOD (ROUTINE X 2): CULTURE: NO GROWTH

## 2016-04-21 ENCOUNTER — Encounter: Payer: Self-pay | Admitting: *Deleted

## 2016-04-23 ENCOUNTER — Telehealth: Payer: Self-pay | Admitting: *Deleted

## 2016-04-23 NOTE — Telephone Encounter (Signed)
Called and let her know  That she can pick up letter when she would like to.

## 2016-04-30 ENCOUNTER — Ambulatory Visit: Payer: Medicare Other | Admitting: Hematology and Oncology

## 2016-04-30 ENCOUNTER — Other Ambulatory Visit: Payer: Medicare Other

## 2016-05-14 NOTE — Progress Notes (Signed)
16mg  levophed at 2mcg maxed out on the upper limit  MD has talked to family about transitioning to comfort care and making pt comfortable

## 2016-05-14 NOTE — Progress Notes (Signed)
16mg  levophed started at 5 mcg

## 2016-05-14 NOTE — Progress Notes (Signed)
MD at bedside Spoke to daughter to assessed pt

## 2016-05-14 NOTE — Progress Notes (Signed)
[place] at [facility]                                                                                                                                                                                  Patient Demographics   Sandy [name], is a 71 y.o. female, DOB - 09-02-1945, [id number]  Admit date - 05/06/2016   Admitting Physician Hillary Bow, MD  Outpatient Primary MD for the patient is Harriett Neita Carp, MD   LOS - 1  Subjective: Called by nurse patient sbp in 60's, oxygen saturation 80's  Pt non responsive    Review of Systems:   CONSTITUTIONAL: unable to provide poorly responisve  Vitals:   Vitals:   05-10-2016 0700 05-10-2016 0800 05/10/16 0805 May 10, 2016 0830  BP: (!) 59/29 (!) 63/44 (!) 62/47 (!) 62/47  Pulse: 64 (!) 57 (!) 57 (!) 56  Resp: 18 17 17 16   Temp:      TempSrc:      SpO2: 90% 90% (!) 89% 95%  Weight:      Height:        Wt Readings from Last 3 Encounters:  10-May-2016 138 lb 14.2 oz (63 kg)  03/01/16 153 lb 3 oz (69.5 kg)  02/11/16 151 lb 1 oz (68.5 kg)    No intake or output data in the 24 hours ending May 10, 2016 0844  Physical Exam:   GENERAL: critical ill HEAD, EYES, EARS, NOSE AND THROAT: Atraumatic, normocephalic. Extraocular muscles are intact. Pupils equal and reactive to light. Sclerae anicteric. No conjunctival injection. No oro-pharyngeal erythema.  NECK: Supple. There is no jugular venous distention. No bruits, no lymphadenopathy, no thyromegaly.  HEART: Regular rate and rhythm,. No murmurs, no rubs, no clicks.  LUNGS: ronchus breath sounds, no wheezes, no cracles ABDOMEN: Soft, flat, nontender, nondistended. Has good bowel sounds. No hepatosplenomegaly appreciated.  EXTREMITIES: No evidence of any cyanosis, clubbing, or peripheral edema.  +2 pedal and radial pulses bilaterally.  NEUROLOGIC: The patient Is lethargic   SKIN: Moist and warm with no rashes appreciated.  Psych: poorly responsive LN: No inguinal  LN enlargement    Antibiotics   Anti-infectives    Start     Dose/Rate Route Frequency Ordered Stop   05-10-16 0300  vancomycin (VANCOCIN) IVPB 750 mg/150 ml premix     750 mg 150 mL/hr over 60 Minutes Intravenous Every 24 hours 05/01/2016 2015     05/09/2016 2245  ceFEPIme (MAXIPIME) 2 GM / 84mL IVPB premix     2 g 100 mL/hr over 30 Minutes Intravenous Every 12 hours 05/01/2016 2018     04/28/2016 2100  vancomycin (VANCOCIN) IVPB 750 mg/150 ml premix  750 mg 150 mL/hr over 60 Minutes Intravenous  Once 04/20/2016 2014 05/03/2016 2251   04/21/2016 1900  cefTRIAXone (ROCEPHIN) IVPB 1 g    Comments:  CAP   1 g 100 mL/hr over 30 Minutes Intravenous  Once 04/27/2016 1752 04/17/2016 1916   04/27/2016 1800  cefTRIAXone (ROCEPHIN) 1 g in dextrose 5 % 50 mL IVPB  Status:  Discontinued     1 g 100 mL/hr over 30 Minutes Intravenous  Once 04/25/2016 1751 05/07/2016 1751   04/30/2016 1800  azithromycin (ZITHROMAX) 500 mg in dextrose 5 % 250 mL IVPB     500 mg 250 mL/hr over 60 Minutes Intravenous  Once 05/04/2016 1751 05/10/2016 1946      Medications   Scheduled Meds: . ceFEPime (MAXIPIME) IV  2 g Intravenous Q12H  . insulin aspart  0-9 Units Subcutaneous Q4H  . pantoprazole (PROTONIX) IV  40 mg Intravenous Q24H  . sodium chloride  1,000 mL Intravenous Once  . sodium chloride  500 mL Intravenous Once  . sodium chloride flush  3 mL Intravenous Q12H  . vancomycin  750 mg Intravenous Q24H   Continuous Infusions: . sodium chloride 100 mL/hr at 04/19/2016 2334  . norepinephrine (LEVOPHED) Adult infusion 9 mcg/min (2016-04-19 0838)   PRN Meds:.acetaminophen **OR** acetaminophen, albuterol, bisacodyl, morphine injection, ondansetron **OR** ondansetron (ZOFRAN) IV   Data Review:   Micro Results Recent Results (from the past 240 hour(s))  Blood Culture (routine x 2)     Status: None (Preliminary result)   Collection Time: 04/26/2016  6:24 PM  Result Value Ref Range Status   Specimen Description BLOOD PORT  Final    Special Requests   Final    BOTTLES DRAWN AEROBIC AND ANAEROBIC AER 10ML ANA 11ML   Culture NO GROWTH < 12 HOURS  Final   Report Status PENDING  Incomplete  MRSA PCR Screening     Status: None   Collection Time: 2016-04-19  6:14 AM  Result Value Ref Range Status   MRSA by PCR NEGATIVE NEGATIVE Final    Comment:        The GeneXpert MRSA Assay (FDA approved for NASAL specimens only), is one component of a comprehensive MRSA colonization surveillance program. It is not intended to diagnose MRSA infection nor to guide or monitor treatment for MRSA infections.     Radiology Reports Dg Chest Portable 1 View  Result Date: 05/08/2016 CLINICAL DATA:  Shortness of breath.  Labored breathing. EXAM: PORTABLE CHEST 1 VIEW COMPARISON:  02/29/2016 FINDINGS: Power port inserted from a left subclavian approach has its tip in the right atrium. Heart size is normal. There is aortic atherosclerosis. There is new patchy density in both lower lobes consistent with mild pneumonia. Right upper lobe is clear. 1 cm density apparently within the left upper lobe was not present on recent CT and must be artifactual. No acute bone finding. IMPRESSION: Development of patchy density in both lower lobes consistent with pneumonia. Electronically Signed   By: Nelson Chimes M.D.   On: 05/13/2016 18:12     CBC  Recent Labs Lab 04/13/16 1824 04-19-16 0601  WBC 16.5* 9.6  HGB 8.1* 8.3*  HCT 25.9* 28.0*  PLT 421 437  MCV 95.4 102.4*  MCH 29.9 30.5  MCHC 31.3* 29.8*  RDW 22.3* 22.8*  LYMPHSABS 0.5*  --   MONOABS 0.3  --   EOSABS 0.1  --   BASOSABS 0.0  --     Chemistries   Recent Labs Lab 04/13/16 1824  05/03/2016 0601  NA 143 146*  K 6.0* 6.0*  CL 119* 125*  CO2 18* 11*  GLUCOSE 111* 128*  BUN 33* 31*  CREATININE 1.52* 1.65*  CALCIUM 7.4* 7.1*  AST 62* 92*  ALT 31 32  ALKPHOS 220* 199*  BILITOT 0.5 0.5    ------------------------------------------------------------------------------------------------------------------ estimated creatinine clearance is 30.7 mL/min (by C-G formula based on SCr of 1.65 mg/dL (H)). ------------------------------------------------------------------------------------------------------------------ No results for input(s): HGBA1C in the last 72 hours. ------------------------------------------------------------------------------------------------------------------ No results for input(s): CHOL, HDL, LDLCALC, TRIG, CHOLHDL, LDLDIRECT in the last 72 hours. ------------------------------------------------------------------------------------------------------------------ No results for input(s): TSH, T4TOTAL, T3FREE, THYROIDAB in the last 72 hours.  Invalid input(s): FREET3 ------------------------------------------------------------------------------------------------------------------ No results for input(s): VITAMINB12, FOLATE, FERRITIN, TIBC, IRON, RETICCTPCT in the last 72 hours.  Coagulation profile No results for input(s): INR, PROTIME in the last 168 hours.  No results for input(s): DDIMER in the last 72 hours.  Cardiac Enzymes  Recent Labs Lab 05/06/2016 1824 04/18/2016 2227 05/03/16 0601  TROPONINI 0.18* 0.08* 0.11*   ------------------------------------------------------------------------------------------------------------------ Invalid input(s): POCBNP    Assessment & Plan   Pt is 71 y.o metastatic colon cancer now with sepsis   * Severe sepsis secondary to bibasilar pneumonia with acute hypoxic respiratory failure and acute encephalopathy Patient wbc is trended down,  However patient's respiratory status has gotten worse requiring 100% oxygen. On BiPAP. Patient's daughter is aware of the critical illnes, She states that she would like to try to see mother would improve but if no significant improvement and would not want her intubated or any  other resuscitative efforts except trial of pressors.   * Colon cancer with metastases to liver Oncology consult placed for admitting physician, however daughter aware of the poor prognosis  * Acute kidney injury with hyperkalemia secondary to dehydration Potassium continues to be elevated. Telemetry shows no significant significant changes except bradycardia We'll continue to monitor for now  * Elevated troponin due to demand ischemia from severe sepsis and pneumonia.   * Worsening anemia due to chemotherapy.continue to monitor  * DVT prophylaxis. Patient is on Eliquis with a recent diagnosis of pulmonary embolism However unable to take oral medicationswe'll change her to Lovenox full dose  All the records are reviewed and case discussed with ED provider. Management plans discussed with the patient, family and they are in agreement.  CODE STATUS: DNR     Code Status Orders        Start     Ordered   04/19/2016 2008  Do not attempt resuscitation (DNR)  Continuous    Question Answer Comment  In the event of cardiac or respiratory ARREST Do not call a "code blue"   In the event of cardiac or respiratory ARREST Do not perform Intubation, CPR, defibrillation or ACLS   In the event of cardiac or respiratory ARREST Use medication by any route, position, wound care, and other measures to relive pain and suffering. May use oxygen, suction and manual treatment of airway obstruction as needed for comfort.      04/21/2016 2008    Code Status History    Date Active Date Inactive Code Status Order ID Comments User Context   05/10/2016  8:05 PM 04/21/2016  8:08 PM DNR [id number]  Hillary Bow, MD ED   03/01/2016  4:02 AM 03/03/2016  9:25 PM Full Code [id number]  Lance Coon, MD Inpatient   02/01/2016  6:43 PM 02/03/2016  7:33 PM Full Code [id number]  Lytle Butte, MD ED   12/31/2015 10:59  PM 01/05/2016  6:05 PM Full Code [id number]  Theodoro Grist, MD Inpatient   11/11/2015  8:03 AM 11/12/2015   8:20 PM DNR [id number]  Hillary Bow, MD Inpatient   11/08/2015  2:54 PM 11/08/2015  5:44 PM DNR [id number]  Awilda Bill, NP Inpatient   11/07/2015  6:17 PM 11/08/2015  2:54 PM Full Code [id number]  Vaughan Basta, MD Inpatient   08/28/2014  4:37 PM 08/29/2014  8:42 PM Full Code [id number]  Theodoro Grist, MD Inpatient    Advance Directive Documentation   Flowsheet Row Most Recent Value  Type of Advance Directive  Healthcare Power of Attorney  Pre-existing out of facility DNR order (yellow form or pink MOST form)  No data  "MOST" Form in Place?  No data           Consults  intensivist   DVT Prophylaxis  Lovenox    Lab Results  Component Value Date   PLT 437 04-15-16     Time Spent in minutes   71min cirtical care time spent case discussed with patient's daughter who is aware of co Greater than 50% of time spent in care coordination and counseling patient regarding the condition and plan of care.   Dustin Flock M.D on 04/15/16 at 8:44 AM  Between 7am to 6pm - Pager - [phone]  After 6pm go to [email or web address] - password [email or web address] [facility]   Office  [phone]

## 2016-05-14 NOTE — Progress Notes (Signed)
Map 51  61/43   Levophed 36mcg

## 2016-05-14 NOTE — Progress Notes (Signed)
Temp 27f rectal

## 2016-05-14 NOTE — Progress Notes (Signed)
   04/16/16 1901  Vitals  Pulse Rate 83  ECG Heart Rate 83  Resp 19  Oxygen Therapy  SpO2 91 %    Pt switched to [place] @2  L/MIN  LEVOPHED and bipap turned off Pt got morphine and robunol Pt is now comfort care

## 2016-05-14 NOTE — Plan of Care (Signed)
Time of death 1932 hrs Verified by Seward Grater RN /  Cecille Amsterdam RN   NP notified  Family at bedside Administrative coordinator made aware

## 2016-05-14 NOTE — Care Management (Signed)
Patient is from home with daughter [name] [phone]. She was recently sent from hospital to home followed by [facility].

## 2016-05-14 NOTE — Progress Notes (Signed)
Chaplain rounded the unit to provide a compassionate presence and support to patient and family.Patient appeared to be sleeping.  Silent prayer was said. Minerva Fester [phone]

## 2016-05-14 NOTE — Progress Notes (Signed)
Called by nursing staff that daughter has agreed to make pt. COMFORT CARE ONLY.   Comfort Care orders placed.    - PRN Morphine, Ativan, Glycopyrrolate ordered.

## 2016-05-14 NOTE — Progress Notes (Signed)
[place] Progress Note Patient Name: Sandy [name] DOB: Sep 09, 1945 MRN: [id number]   Date of Service  05-08-2016  HPI/Events of Note  Nofified of need for DVT and stress ulcer prophylaxis.   eICU Interventions  Will order: 1. SCD's to bilateral LE's. 2. Protonix IV.      Intervention Category Intermediate Interventions: Best-practice therapies (e.g. DVT, beta blocker, etc.)  Sommer,Steven Eugene 08-May-2016, 6:46 AM

## 2016-05-14 NOTE — Progress Notes (Signed)
16 mg levophed 60mcg  BP (!) 67/47   Pulse 87   Temp (!) 95 F (35 C) (Rectal)   Resp 19   Ht 5\' 9"  (1.753 m)   Wt 63 kg (138 lb 14.2 oz)   SpO2 92%   BMI 20.51 kg/m

## 2016-05-14 NOTE — Progress Notes (Signed)
Temp 88.75f rectal.  MD aware- Icu

## 2016-05-14 NOTE — Progress Notes (Signed)
MD notified of the HR being in the 50s and bp dropping, and oxygen sats being low

## 2016-05-14 NOTE — Progress Notes (Signed)
No charge note.  I reviewed the chart, spoke with attending MD and bedside RN.  I examined the patient.  She appears to be actively dying.  I spoke with dtr Kennyth Lose at bedside.  Kennyth Lose asked for prognosis multiple times but then told me I had to call her sister - [name] - as she is the dtr who makes all decisions regarding their mother's care.  I called [name] and introduced myself as Palliative Care.  [name] said - I don't mean to be disrespectful but I have talked to Palliative Care in the past and I don't need to meet with you.  I asked if she understood the position her mother is currently in and she assured me that - yes - she does understand.    I told her to ask the RN for me if I can be of any support or assistance to her or her mother.   Imogene Burn, [place] [facility] Pager: [phone]

## 2016-05-14 NOTE — Consult Note (Signed)
Sandy Sandy [name]     ASSESSMENT/PLAN   Patient is a 71 yo female with history of colon cancer with mets to liver.  PULMONARY A: Acute respiratory failure, CXR images 1/1 reviewed, unremarkable.  P:   -- No clear evidence of pneumonia at this time, but this could appear with hydration.  --continue empiric abx   CARDIOVASCULAR A: Severe septic shock with hypotension and metabolic acidosis.  --Recent PE, on eliquis.  P:  --continue pressors.  --Continue bicarb.  --Start heparin IV.   RENAL A:  AKI.  P:   --Continue IVF, bicarb.   GASTROINTESTINAL A:  --  HEMATOLOGIC/ONC A:  Stage 4 colon cancer with mets to liver.  P:  --  INFECTIOUS A: Septic shock.  P:   Continue abx.   Micro/culture results:  BCx2 negative to date.  UC: pending.  Sputum--  Antibiotics: Vancomycin 1/2>> Cefepime 1/1>>  ENDOCRINE A: Blood glucose controlled.  P:     NEUROLOGIC A: Metabolic encephalopathy.  P:      MAJOR EVENTS/TEST RESULTS:  ADVANCE CARE PLANNING: --Pt is DNR, discussed with daughters, patient's clinical status and tenous and I do not know whether she will recover. From the daughter the patient's course has declined, as she has become progressively weaker over the past few weeks, she has had several hospital admissions over the past few months. Daughter understand that her mother is very ill, she reiterates DNR status, and is willing to transition to comfort measures if it appears that her mother is declining further.    Best Practices  DVT Prophylaxis: on IV heparin.  GI Prophylaxis: protonix.    ---------------------------------------  ---------------------------------------   Name: Sandy Sandy [name] MRN: [id number] DOB: 1945/09/28    ADMISSION DATE:  05/07/2016 Sandy [name] DATE:  2016/05/03  REFERRING MD :  Dr. Serita Grit.   CHIEF COMPLAINT:  Hypotension.    HISTORY OF PRESENT ILLNESS:    The patient is a 71 yo  female with history of stage 4 colon ca with mets to liver. She is currently on bipap and minimally conversant, therefore all history was obtained from daughters at bedside, chart, and staff.  Pt presents to the emergency room brought by the daughter after they noticed patient has been more lethargic and coughing. Here patient has been found to have severe sepsis with blood pressure into the 70s. Hypoxic even on a nonrebreather and placed on a BiPAP. While in the ICU, pt became hypotensive, therefore CCM was consulted.  ABG obtained and reviewed revealed severe metabolic acidosis.     PAST MEDICAL HISTORY :  Past Medical History:  Diagnosis Date  . Cancer ([place])    liver mets; colon cancer  . Constipation   . Diabetes mellitus without complication ([place])   . HOH (hard of hearing)   . Hypertension   . Insomnia   . 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 Wears dentures    partial upper and lower   Past Surgical History:  Procedure Laterality Date  . CARDIAC SURGERY    . COLONOSCOPY WITH PROPOFOL N/A 09/07/2014   Procedure: COLONOSCOPY WITH PROPOFOL;  Surgeon: Lucilla Lame, MD;  Location: [facility] ENDOSCOPY;  Service: Endoscopy;  Laterality: N/A;  . ESOPHAGOGASTRODUODENOSCOPY N/A 09/07/2014   Procedure: ESOPHAGOGASTRODUODENOSCOPY (EGD);  Surgeon: Lucilla Lame, MD;  Location: [facility] ENDOSCOPY;  Service: Endoscopy;  Laterality: N/A;  . FLEXIBLE SIGMOIDOSCOPY N/A 09/02/2015   Procedure: FLEXIBLE SIGMOIDOSCOPY;  Surgeon: Lucilla Lame, MD;  Location: [place];  Service: Endoscopy;  Laterality: N/A;  Diabetic - oral meds Pt has port-a-cath  . LIVER BIOPSY    . PORTACATH PLACEMENT Left 09/24/2014   Procedure: INSERTION PORT-A-CATH;  Surgeon: Robert Bellow, MD;  Location: [facility] ORS;  Service: General;  Laterality: Left;   Prior to Admission medications   Medication Sig Start Date End Date Taking? Authorizing Provider  amiodarone (PACERONE) 400 MG tablet Take 0.5 tablets (200  mg total) by mouth 2 (two) times daily. Patient taking differently: Take 200 mg by mouth daily.  01/05/16  Yes Dustin Flock, MD  apixaban (ELIQUIS) 5 MG TABS tablet Take 1 tablet (5 mg total) by mouth 2 (two) times daily. 03/23/16  Yes Lequita Asal, MD  aspirin EC 81 MG tablet Take 81 mg by mouth daily. Reported on 08/26/2015   Yes Historical Provider, MD  atorvastatin (LIPITOR) 40 MG tablet Take 40 mg by mouth daily.   Yes Historical Provider, MD  gabapentin (NEURONTIN) 100 MG capsule 1 capsule every morning and 2 capsules at bedtime. Patient taking differently: Take 100 mg by mouth 2 (two) times daily.  03/19/16  Yes Lequita Asal, MD  lisinopril (PRINIVIL,ZESTRIL) 10 MG tablet Take 10 mg by mouth daily.   Yes Historical Provider, MD  magnesium 30 MG tablet Take 30 mg by mouth daily.    Yes Historical Provider, MD  megestrol (MEGACE) 400 MG/10ML suspension Take 5 mLs (200 mg total) by mouth daily. to stimulate appetite Patient taking differently: Take 200 mg by mouth 2 (two) times daily. to stimulate appetite 11/06/14  Yes Lequita Asal, MD  metFORMIN (GLUCOPHAGE) 1000 MG tablet Take 1,000 mg by mouth 2 (two) times daily.    Yes Historical Provider, MD  Multiple Vitamin (MULTIVITAMIN WITH MINERALS) TABS tablet Take 1 tablet by mouth daily.   Yes Historical Provider, MD  oxyCODONE (OXY IR/ROXICODONE) 5 MG immediate release tablet Take 1 tablet (5 mg total) by mouth every 6 (six) hours as needed for severe pain. 03/23/16  Yes Lequita Asal, MD  regorafenib (STIVARGA) 40 MG tablet Take with low fat meal. Caution: Chemotherapy. Take 4 tablets daily for 21 days and then 7 days off 03/12/16  Yes Lequita Asal, MD  LORazepam (ATIVAN) 0.5 MG tablet Take 1 tablet (0.5 mg total) by mouth at bedtime as needed for anxiety or sleep. Patient not taking: Reported on 05/04/2016 03/03/16   Hillary Bow, MD  ondansetron (ZOFRAN) 4 MG tablet Take 1 tablet (4 mg total) by mouth every 8 (eight)  hours as needed for nausea or vomiting. 12/19/15   Lequita Asal, MD   Allergies  Allergen Reactions  . Tylenol [Acetaminophen] Nausea And Vomiting    FAMILY HISTORY:  Family History  Problem Relation Age of Onset  . Cancer Sister    SOCIAL HISTORY:  reports that she quit smoking about 2 years ago. Her smoking use included Cigarettes. She has a 5.00 pack-year smoking history. She has never used smokeless tobacco. She reports that she does not drink alcohol or use drugs.  REVIEW OF SYSTEMS:   Could not obtain due to critical illness.    VITAL SIGNS: Temp:  [93.8 F (34.3 C)-97.8 F (36.6 C)] 93.8 F (34.3 C) (01/02 0900) Pulse Rate:  [56-106] 68 (01/02 0930) Resp:  [12-28] 21 (01/02 0930) BP: (59-122)/(29-104) 90/58 (01/02 0930) SpO2:  [72 %-100 %] 100 % (01/02 0930) FiO2 (%):  [80 %-100 %] 100 % (01/02 0800) Weight:  [id number]  lb (61.2 kg)-138 lb 14.2 oz (63 kg)] 138 lb 14.2 oz (63 kg) (01/02 0500) HEMODYNAMICS:   VENTILATOR SETTINGS: FiO2 (%):  [80 %-100 %] 100 % INTAKE / OUTPUT: No intake or output data in the 24 hours ending 2016-04-28 1137  Physical Examination:   VS: BP (!) 90/58   Pulse 68   Temp (!) 93.8 F (34.3 C) (Rectal)   Resp (!) 21   Ht 5\' 9"  (1.753 m)   Wt 138 lb 14.2 oz (63 kg)   SpO2 100%   BMI 20.51 kg/m   General Appearance: No distress  Neuro:without focal findings, mental status, speech normal,. HEENT: PERRLA, EOM intact, no ptosis, no other lesions noticed;  Pulmonary: normal breath sounds., diaphragmatic excursion normal. CardiovascularNormal S1,S2.  No m/r/g.    Abdomen: Benign, Soft, non-tender, No masses, hepatosplenomegaly, No lymphadenopathy Renal:  No costovertebral tenderness  GU:  Not performed at this time. Endoc: No evident thyromegaly, no signs of acromegaly. Skin:   warm, no rashes, no ecchymosis  Extremities: normal, no cyanosis, clubbing, no edema, warm with normal capillary refill.    LABS: Reviewed   LABORATORY  PANEL:   CBC  Recent Labs Lab 28-Apr-2016 0601  WBC 9.6  HGB 8.3*  HCT 28.0*  PLT 437    Chemistries   Recent Labs Lab 04-28-2016 0601  NA 146*  K 6.0*  CL 125*  CO2 11*  GLUCOSE 128*  BUN 31*  CREATININE 1.65*  CALCIUM 7.1*  AST 92*  ALT 32  ALKPHOS 199*  BILITOT 0.5     Recent Labs Lab 05/10/2016 2247 2016/04/28 0558 2016-04-28 0721 Apr 28, 2016 1126 Apr 28, 2016 1129  GLUCAP 117* 127* 117* 110* 168*    Recent Labs Lab 04-28-16 0911  PHART 7.08*  PCO2ART 27*  PO2ART 111*    Recent Labs Lab 05/11/2016 1824 2016/04/28 0601  AST 62* 92*  ALT 31 32  ALKPHOS 220* 199*  BILITOT 0.5 0.5  ALBUMIN 1.1* <1.0*    Cardiac Enzymes  Recent Labs Lab 2016-04-28 0601  TROPONINI 0.11*    RADIOLOGY:  Dg Chest Portable 1 View  Result Date: 05/01/2016 CLINICAL DATA:  Shortness of breath.  Labored breathing. EXAM: PORTABLE CHEST 1 VIEW COMPARISON:  02/29/2016 FINDINGS: Power port inserted from a left subclavian approach has its tip in the right atrium. Heart size is normal. There is aortic atherosclerosis. There is new patchy density in both lower lobes consistent with mild pneumonia. Right upper lobe is clear. 1 cm density apparently within the left upper lobe was not present on recent CT and must be artifactual. No acute bone finding. IMPRESSION: Development of patchy density in both lower lobes consistent with pneumonia. Electronically Signed   By: Nelson Chimes M.D.   On: 04/23/2016 18:12       --Deep Ashby Dawes, MD.  Board Certified in Internal Medicine, Pulmonary Medicine, [place], and Sleep Medicine.  ICU Pager [phone] [facility] Office Number: [phone]  Patricia Pesa, M.D.  Vilinda Boehringer, M.D.  Merton Border, M.D   Apr 28, 2016, 11:37 AM  Critical Care Attestation.  I have personally obtained a history, examined the patient, evaluated laboratory and imaging results, formulated the assessment and plan and placed  orders. The Patient requires high complexity decision making for assessment and support, frequent evaluation and titration of therapies, application of advanced monitoring technologies and extensive interpretation of multiple databases. The patient has critical illness that could lead imminently to failure of 1 or more organ systems and requires the  highest level of physician preparedness to intervene.  Critical Care Time devoted to patient care services described in this note is 45 minutes and is exclusive of time spent in procedures.

## 2016-05-14 NOTE — Discharge Summary (Signed)
[facility] at [facility] Date of Admission: 2016-05-08  5:36 PM  Date of death:   Admitting diagnosis: Respiratory failure     Diagnosis at time of death* 1. Severe sepsis pneumonia 2. Acute hypoxic respiratory failure due to pneumonia 3. Acute encephalopathy 4. Acute kidney injury      Hospital courseBarbara [name]  is a 71 y.o. female with a known history of Colon cancer with liver metastases, diabetes, hypertension, neuropathy presents to the emergency room brought by the daughter after they noticed patient has been more lethargic and coughing. Here patient has been found to have severe sepsis with blood pressure into the 70s. Hypoxic even on a nonrebreather and placed on a BiPAP. Patient was continued on BiPAP. As well as required pressors. Despite this therapy her respiratory status continued to worsen. Patient does also have a history of metastatic colon cancer. Further discussions were held with the family and it was decided to make patient comfort care. After making patient comfort care she passed away.          TOTAL TIME TAKING CARE OF THIS PATIENT: 25 minutes.    Dustin Flock M.D on 04/17/2016 at 8:48 AM  Between 7am to 6pm - Pager - [phone]  After 6pm go to [email or web address] - password [email or web address] [facility]  Office  [phone]  CC: Primary care physician; [name], MD

## 2016-05-14 NOTE — Progress Notes (Signed)
Levophed 18 mcg

## 2016-05-14 NOTE — Plan of Care (Signed)
Pt greater than 90 and INR greater than 11.  MD notified  Dc heparin

## 2016-05-14 NOTE — Progress Notes (Signed)
Temp low 93.66f rectal bair hugger placed on pt

## 2016-05-14 NOTE — Progress Notes (Signed)
Levophed 80mcg

## 2016-05-14 NOTE — Progress Notes (Signed)
levophed 15 mcg

## 2016-05-14 NOTE — Progress Notes (Signed)
eLink Physician-Brief Progress Note /Patient Name: Sandy [name] DOB: [birth year] MRN: [id number]   Date of Service  05-09-16  HPI/Events of Note  71 yo female with PMH of Liver CA. BP = 92/69. Last LVEFF= 65% to 70%.  Patient now admitted with Dx of pneumonia and sepsis. Patient is a DNR/DNI.  eICU Interventions  Will order:  1. 0.9 NaCl 1 liter IV over 1 hour now.      Intervention Category Evaluation Type: New Patient Evaluation  Lysle Dingwall 09-May-2016, 12:53 AM

## 2016-05-14 NOTE — Progress Notes (Signed)
Pharmacy Antibiotic Note  Sandy [name] is a 71 y.o. female with a h/o colon CA with liver mets admitted on 05/02/2016 with pneumonia and sepsis.  Pharmacy has been consulted for vancomycin and cefepime dosing.  Plan: After discussion with Dr. Ashby Dawes, will continue vancomycin despite negative MRSA PCR. Will continue vancomycin 750 mg iv q 24 hours for now and f/u renal function.   Continue cefepime 2 g iv q 12 hours.   Height: 5\' 9"  (175.3 cm) Weight: 138 lb 14.2 oz (63 kg) IBW/kg (Calculated) : 66.2  Temp (24hrs), Avg:93.3 F (34.1 C), Min:88.3 F (31.3 C), Max:97.8 F (36.6 C)   Recent Labs Lab 05/07/2016 1824 04/23/2016 2227 May 02, 2016 0601  WBC 16.5*  --  9.6  CREATININE 1.52*  --  1.65*  LATICACIDVEN 2.2* 2.7*  --     Estimated Creatinine Clearance: 30.7 mL/min (by C-G formula based on SCr of 1.65 mg/dL (H)).    Allergies  Allergen Reactions  . Tylenol [Acetaminophen] Nausea And Vomiting    Antimicrobials this admission: Azithromycin and ceftriaxone 01/01 x 1 cefepime 01/01 >>  Vancomycin 01/01 >>  Dose adjustments this admission:   Microbiology results: 01/01 BCx: NGTD 01/02 BCx: sent UCx: sent  01/02 MRSA PCR: negative  Thank you for allowing pharmacy to be a part of this patient's care.  [name] May 02, 2016 12:27 PM

## 2016-05-14 NOTE — Progress Notes (Signed)
Levo 73mcg

## 2016-05-14 NOTE — Progress Notes (Signed)
Visit made. Patient is currently followed by [facility] with a diagnosis of colon cancer. She lives at home with her daughter [name], who is her primary caregiver.  Patient was admitted on 1/1 for respiratory distress, sepsis. Patient is a current open APS case, hospital CSW Shela Leff made aware. [facility] SW Atha Starks made APS caseworker aware.  Patient seen lying in bed, Bipap in place. She is currently requiring vasopressors and the bear hugger for management of low BP and temp. Daughter [name] not at bedside. Chart notes reviewed, spoke with staff RN Chrys Racer and attending physician Dr. Posey Pronto, very poor prognosis at this time. [facility] team updated to admission. Hospital care team made aware of patient current [facility] services.Will continue to follow through final disposition. Thank you. Flo Shanks RN, BSN, [facility]ia[facility] [phone] c

## 2016-05-14 DEATH — deceased

## 2017-02-11 IMAGING — CT CT HEAD W/O CM
3 series · 15 of 46 positions shown, 18 images · non-contrast
Comparison: 10/18/2015

CLINICAL DATA: Altered mental status. Patient is currently being
treated for colon cancer.

EXAM:
CT HEAD WITHOUT CONTRAST
TECHNIQUE: Contiguous axial images were obtained from the base of the skull
through the vertex without intravenous contrast.

[Series 2: head wo · axial · 0.43mm/px · z∈[-55,+65]mm · 9 of 29 slices shown, 12 images]
[im 3/29  brain]
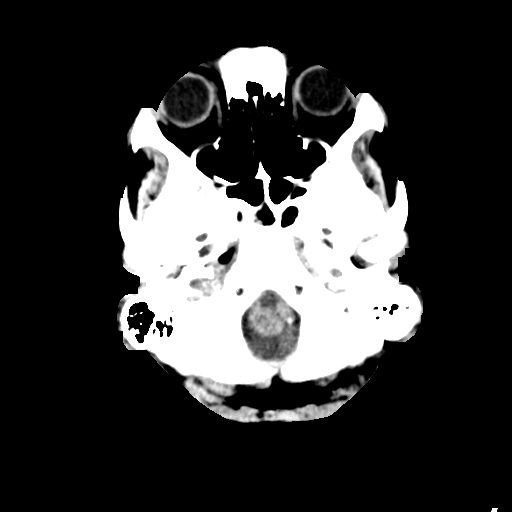
[im 3/29  bone]
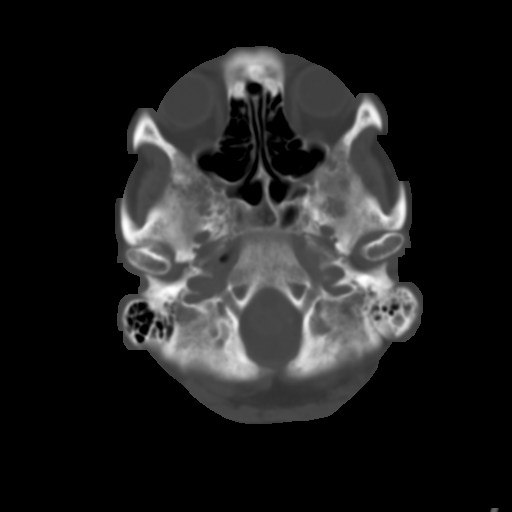
[im 6/29  brain]
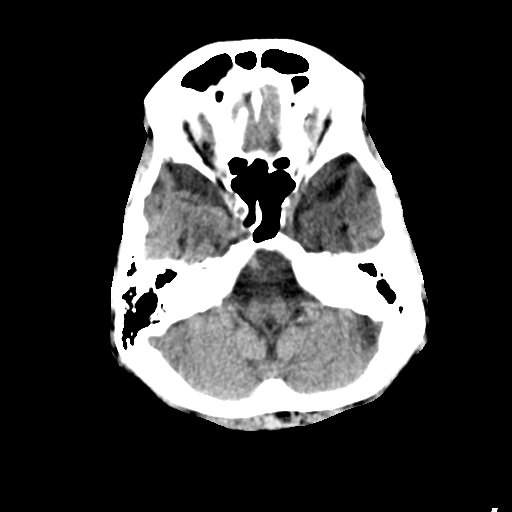
[im 9/29  brain]
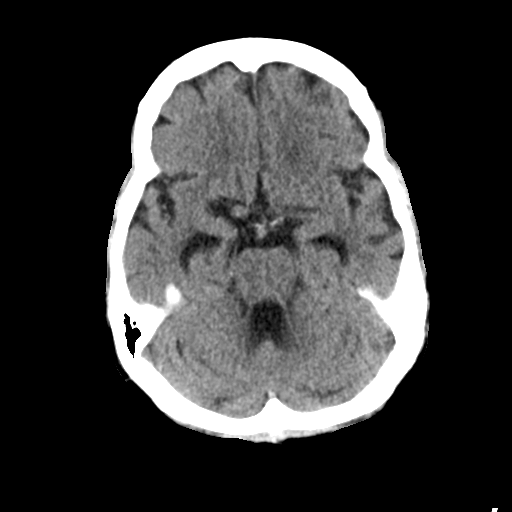
[im 12/29  brain]
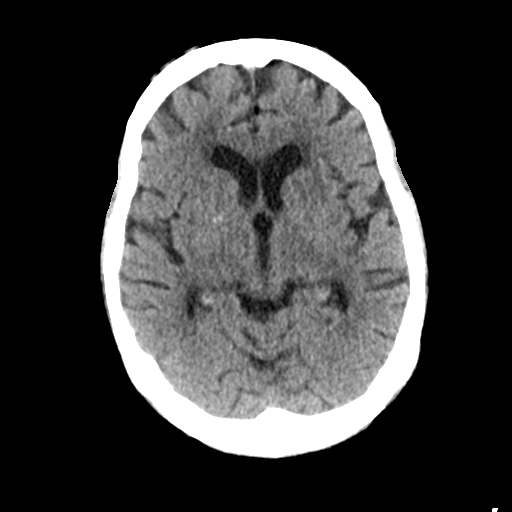
[im 15/29  brain]
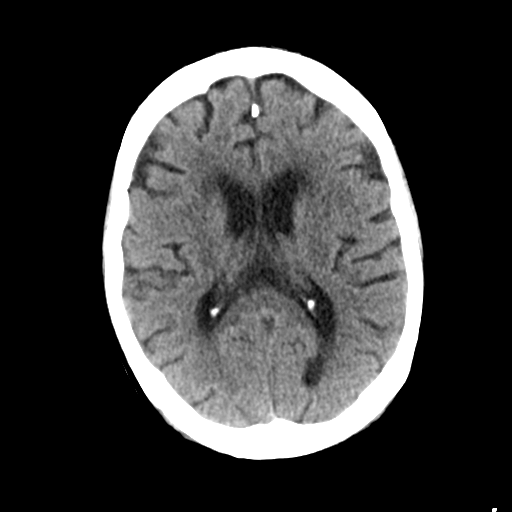
[im 15/29  bone]
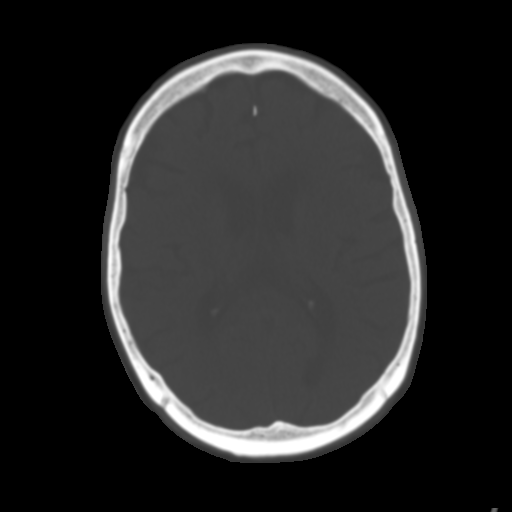
[im 18/29  brain]
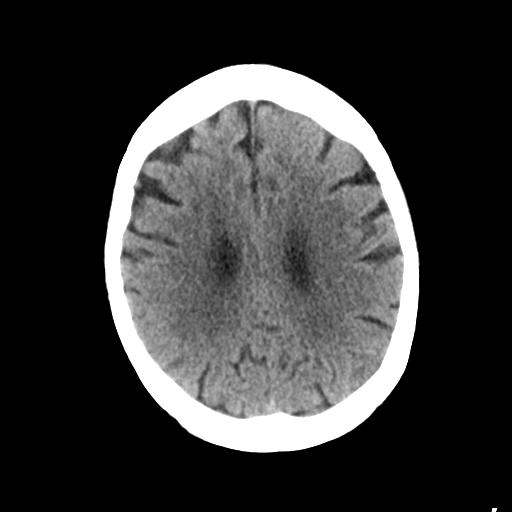
[im 21/29  brain]
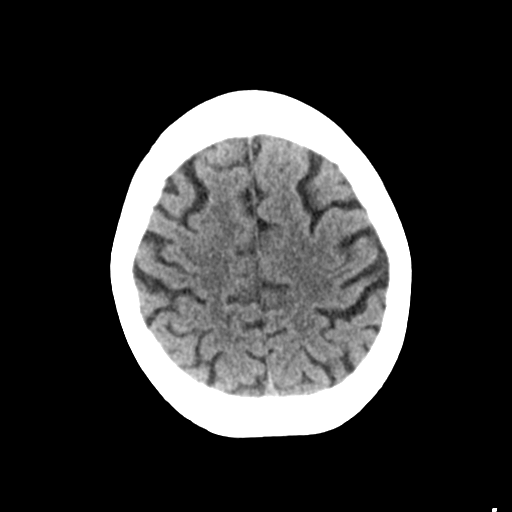
[im 24/29  brain]
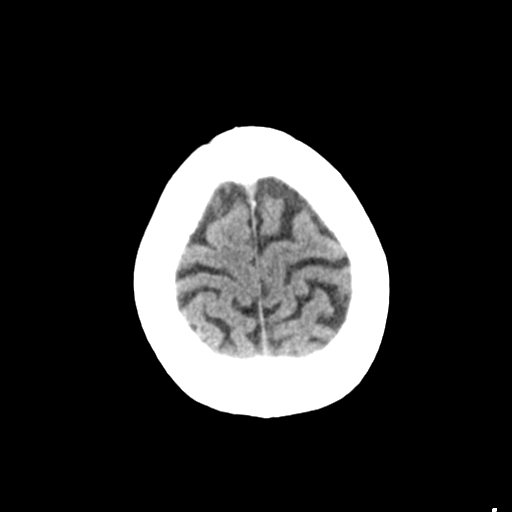
[im 27/29  brain]
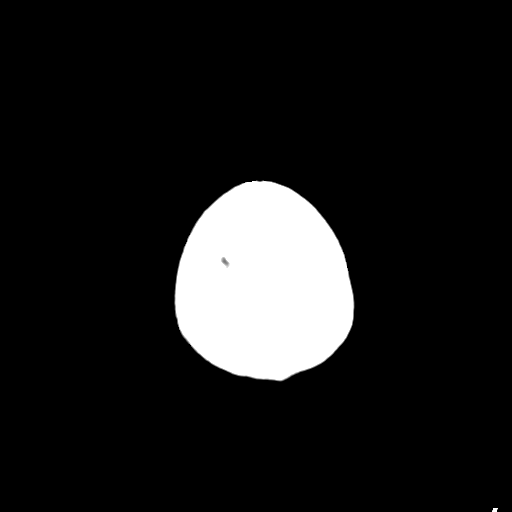
[im 27/29  bone]
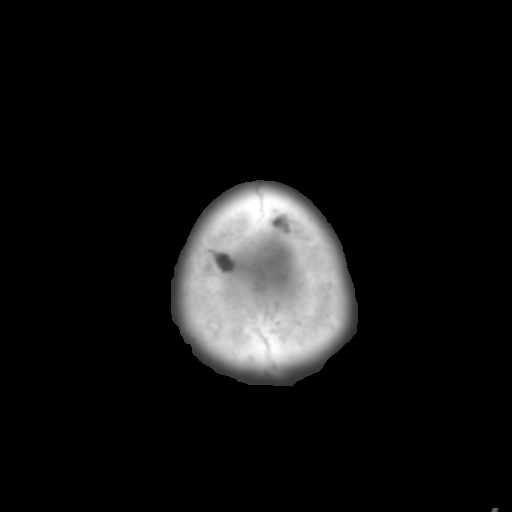

[Series 4: coronal soft tissue · coronal · 0.30mm/px · 3 of 62 slices shown]
[im 21/62  brain]
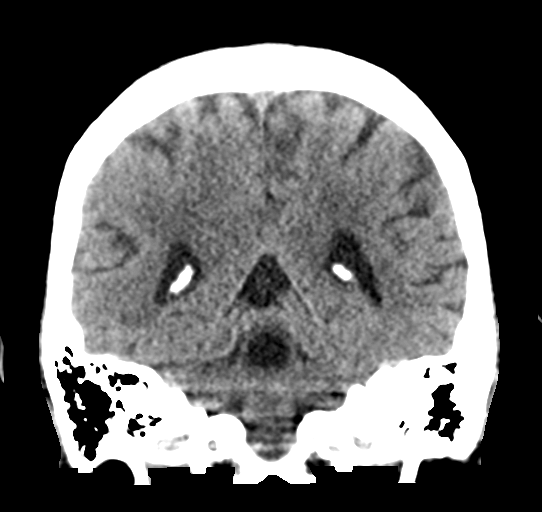
[im 28/62  brain]
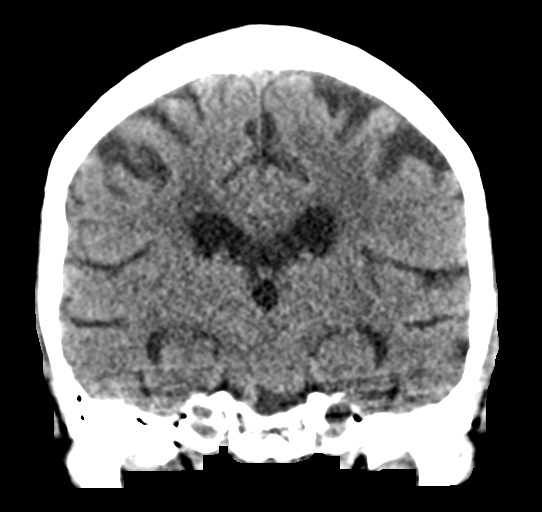
[im 34/62  brain]
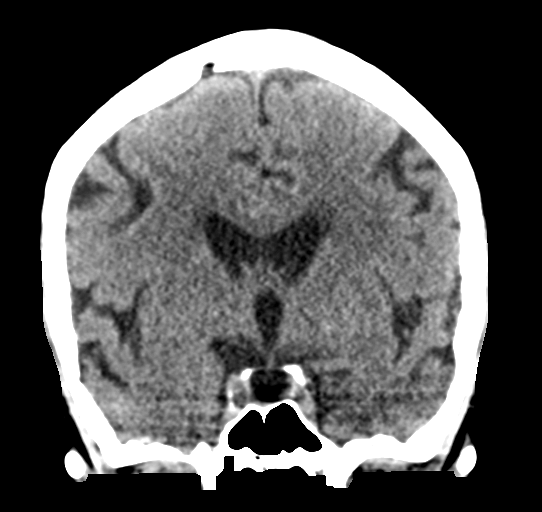

[Series 5: sagittal soft tissue · sagittal · 0.28mm/px · 3 of 49 slices shown]
[im 17/49  brain]
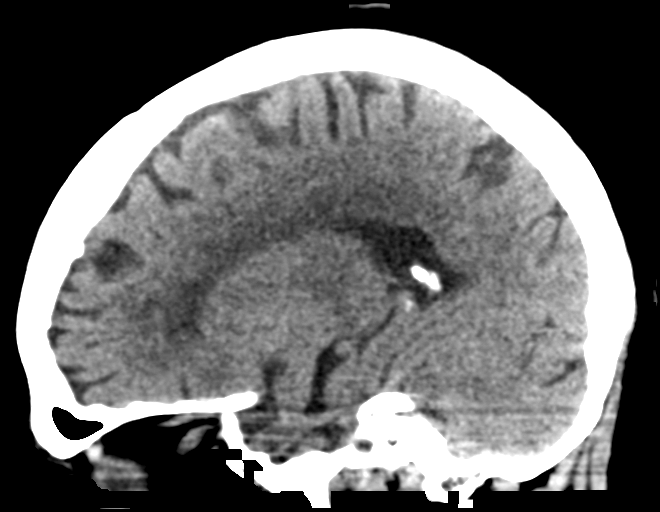
[im 25/49  brain]
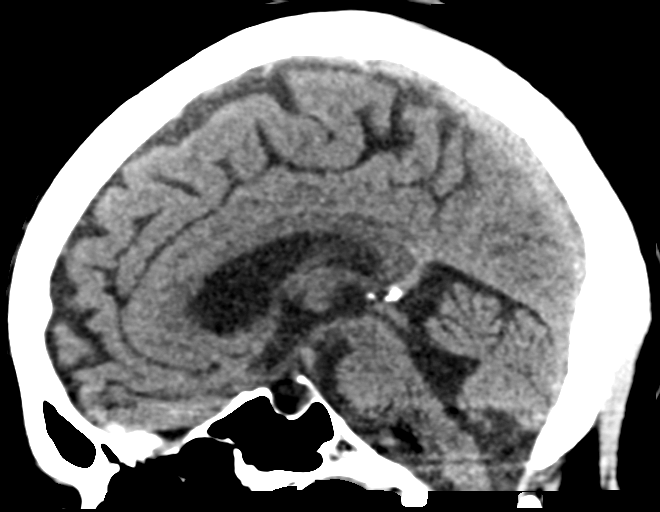
[im 33/49  brain]
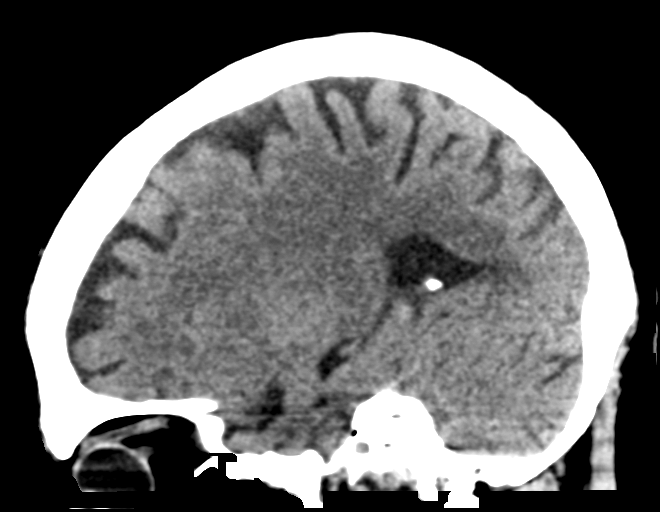

[15 of 46 positions shown; findings below may reference images not displayed]

FINDINGS: Brain: Mild atrophy with sulcal prominence and centralized volume
loss with mild commensurate ex vacuo dilatation of the ventricular
system, asymmetrically involving the anterior horns of the bilateral
lateral ventricles. Minimal periventricular hypodensities compatible
microvascular ischemic disease. Right basilar ganglial
calcifications. The gray-white differentiation is otherwise well
maintained without CT evidence of acute large territory infarct. No
intraparenchymal or extra-axial mass or hemorrhage. Normal
configuration of the ventricles and basilar cisterns. No midline
shift.

Vascular: Intracranial atherosclerosis

Skull: No displaced calvarial fracture.

Sinuses/Orbits: Limited visualization of the paranasal sinuses and
mastoid air cells is normal. No air-fluid levels.

Other: Regional soft tissues appear normal.
IMPRESSION: Mild atrophy and microvascular ischemic disease without acute
intracranial process.
